# Patient Record
Sex: Male | Born: 1935 | Race: Black or African American | Hispanic: No | Smoking: Former smoker
Health system: Southern US, Community
[De-identification: ages and names within clinical notes are randomized; demographics above are authoritative.]

## PROBLEM LIST (undated history)

## (undated) DIAGNOSIS — R109 Unspecified abdominal pain: Secondary | ICD-10-CM

## (undated) DIAGNOSIS — R569 Unspecified convulsions: Secondary | ICD-10-CM

## (undated) DIAGNOSIS — F0390 Unspecified dementia without behavioral disturbance: Secondary | ICD-10-CM

## (undated) DIAGNOSIS — K219 Gastro-esophageal reflux disease without esophagitis: Secondary | ICD-10-CM

## (undated) DIAGNOSIS — R0600 Dyspnea, unspecified: Secondary | ICD-10-CM

## (undated) DIAGNOSIS — H9190 Unspecified hearing loss, unspecified ear: Secondary | ICD-10-CM

## (undated) DIAGNOSIS — IMO0002 Reserved for concepts with insufficient information to code with codable children: Secondary | ICD-10-CM

## (undated) DIAGNOSIS — N390 Urinary tract infection, site not specified: Secondary | ICD-10-CM

## (undated) DIAGNOSIS — J929 Pleural plaque without asbestos: Secondary | ICD-10-CM

## (undated) DIAGNOSIS — R4182 Altered mental status, unspecified: Principal | ICD-10-CM

## (undated) DIAGNOSIS — T4145XA Adverse effect of unspecified anesthetic, initial encounter: Secondary | ICD-10-CM

## (undated) DIAGNOSIS — F419 Anxiety disorder, unspecified: Secondary | ICD-10-CM

## (undated) DIAGNOSIS — J449 Chronic obstructive pulmonary disease, unspecified: Secondary | ICD-10-CM

## (undated) DIAGNOSIS — G8929 Other chronic pain: Secondary | ICD-10-CM

## (undated) DIAGNOSIS — G9349 Other encephalopathy: Secondary | ICD-10-CM

## (undated) DIAGNOSIS — E876 Hypokalemia: Secondary | ICD-10-CM

## (undated) DIAGNOSIS — J9610 Chronic respiratory failure, unspecified whether with hypoxia or hypercapnia: Secondary | ICD-10-CM

## (undated) DIAGNOSIS — E785 Hyperlipidemia, unspecified: Secondary | ICD-10-CM

## (undated) DIAGNOSIS — R0689 Other abnormalities of breathing: Secondary | ICD-10-CM

## (undated) DIAGNOSIS — E119 Type 2 diabetes mellitus without complications: Secondary | ICD-10-CM

## (undated) DIAGNOSIS — R0609 Other forms of dyspnea: Secondary | ICD-10-CM

## (undated) DIAGNOSIS — C459 Mesothelioma, unspecified: Secondary | ICD-10-CM

## (undated) DIAGNOSIS — D72829 Elevated white blood cell count, unspecified: Secondary | ICD-10-CM

## (undated) DIAGNOSIS — R001 Bradycardia, unspecified: Secondary | ICD-10-CM

## (undated) DIAGNOSIS — B999 Unspecified infectious disease: Secondary | ICD-10-CM

## (undated) DIAGNOSIS — H409 Unspecified glaucoma: Secondary | ICD-10-CM

## (undated) DIAGNOSIS — R0602 Shortness of breath: Secondary | ICD-10-CM

## (undated) DIAGNOSIS — R0902 Hypoxemia: Secondary | ICD-10-CM

## (undated) DIAGNOSIS — I10 Essential (primary) hypertension: Secondary | ICD-10-CM

## (undated) DIAGNOSIS — R918 Other nonspecific abnormal finding of lung field: Secondary | ICD-10-CM

## (undated) HISTORY — DX: Hyperlipidemia, unspecified: E78.5

## (undated) HISTORY — DX: Essential (primary) hypertension: I10

## (undated) HISTORY — DX: Other chronic pain: G89.29

## (undated) HISTORY — DX: Gastro-esophageal reflux disease without esophagitis: K21.9

## (undated) HISTORY — PX: PROSTATE SURGERY: SHX751

## (undated) HISTORY — PX: INGUINAL HERNIA REPAIR: SUR1180

## (undated) HISTORY — DX: Unspecified abdominal pain: R10.9

## (undated) HISTORY — DX: Anxiety disorder, unspecified: F41.9

---

## 2000-12-25 ENCOUNTER — Encounter: Payer: Self-pay | Admitting: Internal Medicine

## 2000-12-25 ENCOUNTER — Ambulatory Visit (HOSPITAL_COMMUNITY): Admission: RE | Admit: 2000-12-25 | Discharge: 2000-12-25 | Payer: Self-pay | Admitting: Internal Medicine

## 2001-04-01 ENCOUNTER — Ambulatory Visit (HOSPITAL_COMMUNITY): Admission: RE | Admit: 2001-04-01 | Discharge: 2001-04-01 | Payer: Self-pay | Admitting: Ophthalmology

## 2001-06-21 ENCOUNTER — Encounter: Payer: Self-pay | Admitting: Emergency Medicine

## 2001-06-21 ENCOUNTER — Emergency Department (HOSPITAL_COMMUNITY): Admission: EM | Admit: 2001-06-21 | Discharge: 2001-06-21 | Payer: Self-pay | Admitting: Emergency Medicine

## 2002-02-27 ENCOUNTER — Ambulatory Visit: Admission: RE | Admit: 2002-02-27 | Discharge: 2002-05-28 | Payer: Self-pay | Admitting: Radiation Oncology

## 2002-03-06 ENCOUNTER — Emergency Department (HOSPITAL_COMMUNITY): Admission: EM | Admit: 2002-03-06 | Discharge: 2002-03-06 | Payer: Self-pay | Admitting: Emergency Medicine

## 2002-03-06 ENCOUNTER — Encounter: Payer: Self-pay | Admitting: Emergency Medicine

## 2002-05-20 ENCOUNTER — Ambulatory Visit (HOSPITAL_COMMUNITY): Admission: RE | Admit: 2002-05-20 | Discharge: 2002-05-20 | Payer: Self-pay | Admitting: Internal Medicine

## 2002-05-20 ENCOUNTER — Encounter: Payer: Self-pay | Admitting: Internal Medicine

## 2002-06-11 ENCOUNTER — Ambulatory Visit (HOSPITAL_COMMUNITY): Admission: RE | Admit: 2002-06-11 | Discharge: 2002-06-11 | Payer: Self-pay | Admitting: Internal Medicine

## 2002-11-16 ENCOUNTER — Emergency Department (HOSPITAL_COMMUNITY): Admission: EM | Admit: 2002-11-16 | Discharge: 2002-11-16 | Payer: Self-pay | Admitting: *Deleted

## 2002-11-16 ENCOUNTER — Encounter: Payer: Self-pay | Admitting: *Deleted

## 2003-03-21 ENCOUNTER — Inpatient Hospital Stay (HOSPITAL_COMMUNITY): Admission: EM | Admit: 2003-03-21 | Discharge: 2003-03-23 | Payer: Self-pay | Admitting: Emergency Medicine

## 2003-03-21 ENCOUNTER — Encounter: Payer: Self-pay | Admitting: Emergency Medicine

## 2003-04-11 ENCOUNTER — Emergency Department (HOSPITAL_COMMUNITY): Admission: EM | Admit: 2003-04-11 | Discharge: 2003-04-11 | Payer: Self-pay | Admitting: Emergency Medicine

## 2003-09-11 DIAGNOSIS — T8859XA Other complications of anesthesia, initial encounter: Secondary | ICD-10-CM

## 2003-09-11 HISTORY — PX: CATARACT EXTRACTION W/ INTRAOCULAR LENS IMPLANT: SHX1309

## 2003-09-11 HISTORY — DX: Other complications of anesthesia, initial encounter: T88.59XA

## 2004-03-15 ENCOUNTER — Emergency Department (HOSPITAL_COMMUNITY): Admission: EM | Admit: 2004-03-15 | Discharge: 2004-03-15 | Payer: Self-pay | Admitting: Emergency Medicine

## 2004-04-19 ENCOUNTER — Ambulatory Visit (HOSPITAL_COMMUNITY): Admission: RE | Admit: 2004-04-19 | Discharge: 2004-04-19 | Payer: Self-pay | Admitting: Internal Medicine

## 2004-05-04 ENCOUNTER — Ambulatory Visit (HOSPITAL_COMMUNITY): Admission: RE | Admit: 2004-05-04 | Discharge: 2004-05-04 | Payer: Self-pay | Admitting: Family Medicine

## 2004-05-05 ENCOUNTER — Encounter (INDEPENDENT_AMBULATORY_CARE_PROVIDER_SITE_OTHER): Payer: Self-pay | Admitting: *Deleted

## 2004-06-08 ENCOUNTER — Ambulatory Visit: Payer: Self-pay | Admitting: Psychiatry

## 2004-06-20 ENCOUNTER — Ambulatory Visit (HOSPITAL_COMMUNITY): Admission: RE | Admit: 2004-06-20 | Discharge: 2004-06-20 | Payer: Self-pay | Admitting: Internal Medicine

## 2004-07-11 ENCOUNTER — Ambulatory Visit: Payer: Self-pay | Admitting: Family Medicine

## 2004-07-12 ENCOUNTER — Ambulatory Visit (HOSPITAL_COMMUNITY): Admission: RE | Admit: 2004-07-12 | Discharge: 2004-07-12 | Payer: Self-pay | Admitting: Family Medicine

## 2004-07-24 ENCOUNTER — Ambulatory Visit: Payer: Self-pay | Admitting: Family Medicine

## 2004-08-08 ENCOUNTER — Ambulatory Visit: Payer: Self-pay | Admitting: Family Medicine

## 2004-08-11 ENCOUNTER — Ambulatory Visit: Payer: Self-pay | Admitting: Psychology

## 2004-11-01 ENCOUNTER — Ambulatory Visit: Payer: Self-pay | Admitting: Family Medicine

## 2004-11-07 ENCOUNTER — Ambulatory Visit: Payer: Self-pay | Admitting: Family Medicine

## 2004-11-28 ENCOUNTER — Ambulatory Visit: Payer: Self-pay | Admitting: Family Medicine

## 2005-01-09 ENCOUNTER — Ambulatory Visit: Payer: Self-pay | Admitting: Family Medicine

## 2005-02-26 ENCOUNTER — Ambulatory Visit: Payer: Self-pay | Admitting: Family Medicine

## 2005-03-01 ENCOUNTER — Ambulatory Visit (HOSPITAL_COMMUNITY): Admission: RE | Admit: 2005-03-01 | Discharge: 2005-03-01 | Payer: Self-pay | Admitting: Family Medicine

## 2005-03-15 ENCOUNTER — Ambulatory Visit: Payer: Self-pay | Admitting: Family Medicine

## 2005-07-03 ENCOUNTER — Encounter (INDEPENDENT_AMBULATORY_CARE_PROVIDER_SITE_OTHER): Payer: Self-pay | Admitting: Urology

## 2005-07-03 ENCOUNTER — Inpatient Hospital Stay (HOSPITAL_COMMUNITY): Admission: RE | Admit: 2005-07-03 | Discharge: 2005-07-06 | Payer: Self-pay | Admitting: Urology

## 2005-07-27 ENCOUNTER — Ambulatory Visit: Payer: Self-pay | Admitting: Family Medicine

## 2005-08-20 ENCOUNTER — Ambulatory Visit: Payer: Self-pay | Admitting: Family Medicine

## 2005-09-26 ENCOUNTER — Ambulatory Visit: Payer: Self-pay | Admitting: Family Medicine

## 2005-11-26 ENCOUNTER — Ambulatory Visit: Payer: Self-pay | Admitting: Family Medicine

## 2005-12-17 ENCOUNTER — Emergency Department (HOSPITAL_COMMUNITY): Admission: EM | Admit: 2005-12-17 | Discharge: 2005-12-17 | Payer: Self-pay | Admitting: Emergency Medicine

## 2006-04-11 ENCOUNTER — Ambulatory Visit: Payer: Self-pay | Admitting: Family Medicine

## 2006-04-11 ENCOUNTER — Ambulatory Visit (HOSPITAL_COMMUNITY): Admission: RE | Admit: 2006-04-11 | Discharge: 2006-04-11 | Payer: Self-pay | Admitting: Family Medicine

## 2006-05-05 ENCOUNTER — Emergency Department (HOSPITAL_COMMUNITY): Admission: EM | Admit: 2006-05-05 | Discharge: 2006-05-05 | Payer: Self-pay | Admitting: Emergency Medicine

## 2006-05-08 ENCOUNTER — Ambulatory Visit: Payer: Self-pay | Admitting: Internal Medicine

## 2006-05-30 ENCOUNTER — Ambulatory Visit (HOSPITAL_COMMUNITY): Admission: RE | Admit: 2006-05-30 | Discharge: 2006-05-30 | Payer: Self-pay | Admitting: Internal Medicine

## 2006-05-30 ENCOUNTER — Ambulatory Visit: Payer: Self-pay | Admitting: Internal Medicine

## 2006-06-03 ENCOUNTER — Ambulatory Visit (HOSPITAL_COMMUNITY): Admission: RE | Admit: 2006-06-03 | Discharge: 2006-06-03 | Payer: Self-pay | Admitting: Internal Medicine

## 2006-07-12 ENCOUNTER — Ambulatory Visit: Payer: Self-pay | Admitting: Family Medicine

## 2006-07-16 ENCOUNTER — Ambulatory Visit (HOSPITAL_COMMUNITY): Admission: RE | Admit: 2006-07-16 | Discharge: 2006-07-16 | Payer: Self-pay | Admitting: Family Medicine

## 2006-08-08 ENCOUNTER — Emergency Department (HOSPITAL_COMMUNITY): Admission: EM | Admit: 2006-08-08 | Discharge: 2006-08-08 | Payer: Self-pay | Admitting: Emergency Medicine

## 2006-08-12 ENCOUNTER — Ambulatory Visit: Payer: Self-pay | Admitting: Internal Medicine

## 2006-08-15 ENCOUNTER — Ambulatory Visit: Payer: Self-pay | Admitting: Family Medicine

## 2006-10-01 ENCOUNTER — Ambulatory Visit: Payer: Self-pay | Admitting: Family Medicine

## 2006-11-06 ENCOUNTER — Ambulatory Visit: Payer: Self-pay | Admitting: Family Medicine

## 2006-12-06 ENCOUNTER — Ambulatory Visit: Payer: Self-pay | Admitting: Family Medicine

## 2006-12-19 ENCOUNTER — Encounter: Payer: Self-pay | Admitting: Family Medicine

## 2006-12-19 LAB — CONVERTED CEMR LAB
ALT: 24 units/L (ref 0–53)
AST: 19 units/L (ref 0–37)
Albumin: 4.3 g/dL (ref 3.5–5.2)
Bilirubin, Direct: 0.1 mg/dL (ref 0.0–0.3)
Chloride: 111 meq/L (ref 96–112)
Cholesterol: 149 mg/dL (ref 0–200)
Glucose, Bld: 96 mg/dL (ref 70–99)
HDL: 44 mg/dL (ref >39)
Indirect Bilirubin: 0.3 mg/dL (ref 0.0–0.9)
LDL Cholesterol: 85 mg/dL (ref 0–99)
Sodium: 143 meq/L (ref 135–145)
VLDL: 20 mg/dL (ref 0–40)

## 2006-12-30 ENCOUNTER — Ambulatory Visit: Payer: Self-pay | Admitting: Family Medicine

## 2007-01-23 ENCOUNTER — Ambulatory Visit: Payer: Self-pay | Admitting: Family Medicine

## 2007-03-24 ENCOUNTER — Ambulatory Visit: Payer: Self-pay | Admitting: Family Medicine

## 2007-06-24 ENCOUNTER — Ambulatory Visit: Payer: Self-pay | Admitting: Family Medicine

## 2007-06-28 ENCOUNTER — Emergency Department (HOSPITAL_COMMUNITY): Admission: EM | Admit: 2007-06-28 | Discharge: 2007-06-28 | Payer: Self-pay | Admitting: Emergency Medicine

## 2007-07-08 ENCOUNTER — Encounter: Payer: Self-pay | Admitting: Family Medicine

## 2007-07-08 ENCOUNTER — Encounter (INDEPENDENT_AMBULATORY_CARE_PROVIDER_SITE_OTHER): Payer: Self-pay | Admitting: *Deleted

## 2007-07-08 LAB — CONVERTED CEMR LAB
Bilirubin, Direct: 0.1 mg/dL (ref 0.0–0.3)
Cholesterol: 172 mg/dL (ref 0–200)
Eosinophils Absolute: 0.2 10*3/uL (ref 0.0–0.7)
Eosinophils Relative: 3 % (ref 0–5)
HCT: 45.1 % (ref 39.0–52.0)
Indirect Bilirubin: 0.4 mg/dL (ref 0.0–0.9)
Lymphs Abs: 2.9 10*3/uL (ref 0.7–3.3)
Monocytes Relative: 7 % (ref 3–11)
Neutro Abs: 3.4 10*3/uL (ref 1.7–7.7)
Neutrophils Relative %: 49 % (ref 43–77)
Platelets: 250 10*3/uL (ref 150–400)
Sodium: 145 meq/L (ref 135–145)
Total Bilirubin: 0.5 mg/dL (ref 0.3–1.2)
Total CHOL/HDL Ratio: 3.9
Total Protein: 8.1 g/dL (ref 6.0–8.3)
Triglycerides: 159 mg/dL — ABNORMAL HIGH (ref <150)

## 2007-07-21 ENCOUNTER — Ambulatory Visit: Payer: Self-pay | Admitting: Family Medicine

## 2007-07-24 ENCOUNTER — Ambulatory Visit (HOSPITAL_COMMUNITY): Admission: RE | Admit: 2007-07-24 | Discharge: 2007-07-24 | Payer: Self-pay | Admitting: Family Medicine

## 2007-08-11 ENCOUNTER — Ambulatory Visit: Payer: Self-pay | Admitting: Internal Medicine

## 2007-08-18 ENCOUNTER — Ambulatory Visit: Payer: Self-pay | Admitting: Internal Medicine

## 2007-08-18 ENCOUNTER — Ambulatory Visit (HOSPITAL_COMMUNITY): Admission: RE | Admit: 2007-08-18 | Discharge: 2007-08-18 | Payer: Self-pay | Admitting: Internal Medicine

## 2007-09-11 ENCOUNTER — Encounter: Payer: Self-pay | Admitting: Family Medicine

## 2007-10-16 ENCOUNTER — Ambulatory Visit: Payer: Self-pay | Admitting: Internal Medicine

## 2007-12-09 ENCOUNTER — Emergency Department (HOSPITAL_COMMUNITY): Admission: EM | Admit: 2007-12-09 | Discharge: 2007-12-09 | Payer: Self-pay | Admitting: Emergency Medicine

## 2007-12-24 ENCOUNTER — Encounter (INDEPENDENT_AMBULATORY_CARE_PROVIDER_SITE_OTHER): Payer: Self-pay | Admitting: *Deleted

## 2007-12-24 DIAGNOSIS — R109 Unspecified abdominal pain: Secondary | ICD-10-CM | POA: Insufficient documentation

## 2007-12-24 DIAGNOSIS — K219 Gastro-esophageal reflux disease without esophagitis: Secondary | ICD-10-CM

## 2007-12-24 DIAGNOSIS — I10 Essential (primary) hypertension: Secondary | ICD-10-CM | POA: Insufficient documentation

## 2007-12-24 DIAGNOSIS — E785 Hyperlipidemia, unspecified: Secondary | ICD-10-CM

## 2007-12-24 DIAGNOSIS — H409 Unspecified glaucoma: Secondary | ICD-10-CM | POA: Insufficient documentation

## 2007-12-24 DIAGNOSIS — F411 Generalized anxiety disorder: Secondary | ICD-10-CM | POA: Insufficient documentation

## 2008-01-13 ENCOUNTER — Ambulatory Visit: Payer: Self-pay | Admitting: Family Medicine

## 2008-03-08 ENCOUNTER — Ambulatory Visit: Payer: Self-pay | Admitting: Family Medicine

## 2008-03-14 ENCOUNTER — Emergency Department (HOSPITAL_COMMUNITY): Admission: EM | Admit: 2008-03-14 | Discharge: 2008-03-14 | Payer: Self-pay | Admitting: Emergency Medicine

## 2008-04-05 ENCOUNTER — Ambulatory Visit: Payer: Self-pay | Admitting: Family Medicine

## 2008-04-26 ENCOUNTER — Telehealth (INDEPENDENT_AMBULATORY_CARE_PROVIDER_SITE_OTHER): Payer: Self-pay | Admitting: *Deleted

## 2008-04-27 ENCOUNTER — Encounter: Payer: Self-pay | Admitting: Family Medicine

## 2008-05-07 ENCOUNTER — Ambulatory Visit: Payer: Self-pay | Admitting: Family Medicine

## 2008-05-10 ENCOUNTER — Encounter: Payer: Self-pay | Admitting: Family Medicine

## 2008-05-10 LAB — CONVERTED CEMR LAB
Albumin: 4.2 g/dL (ref 3.5–5.2)
Alkaline Phosphatase: 75 units/L (ref 39–117)
Bilirubin, Direct: 0.1 mg/dL (ref 0.0–0.3)
CO2: 22 meq/L (ref 19–32)
Calcium: 9.7 mg/dL (ref 8.4–10.5)
Cholesterol: 154 mg/dL (ref 0–200)
Creatinine, Ser: 1.22 mg/dL (ref 0.40–1.50)
Sodium: 140 meq/L (ref 135–145)
Total CHOL/HDL Ratio: 3
Triglycerides: 98 mg/dL (ref <150)

## 2008-05-13 ENCOUNTER — Encounter: Payer: Self-pay | Admitting: Family Medicine

## 2008-06-18 ENCOUNTER — Ambulatory Visit (HOSPITAL_COMMUNITY): Admission: RE | Admit: 2008-06-18 | Discharge: 2008-06-18 | Payer: Self-pay | Admitting: Family Medicine

## 2008-06-18 ENCOUNTER — Ambulatory Visit: Payer: Self-pay | Admitting: Family Medicine

## 2008-06-18 LAB — CONVERTED CEMR LAB
BUN: 13 mg/dL (ref 6–23)
Calcium: 10 mg/dL (ref 8.4–10.5)
Glucose, Bld: 95 mg/dL (ref 70–99)

## 2008-06-24 ENCOUNTER — Ambulatory Visit: Payer: Self-pay | Admitting: Internal Medicine

## 2008-06-25 ENCOUNTER — Telehealth: Payer: Self-pay | Admitting: Family Medicine

## 2008-07-08 ENCOUNTER — Encounter: Payer: Self-pay | Admitting: Family Medicine

## 2008-07-08 ENCOUNTER — Telehealth: Payer: Self-pay | Admitting: Family Medicine

## 2008-07-23 ENCOUNTER — Ambulatory Visit: Payer: Self-pay | Admitting: Family Medicine

## 2008-07-23 DIAGNOSIS — F0390 Unspecified dementia without behavioral disturbance: Secondary | ICD-10-CM

## 2008-07-23 DIAGNOSIS — F22 Delusional disorders: Secondary | ICD-10-CM

## 2008-08-11 ENCOUNTER — Telehealth: Payer: Self-pay | Admitting: Family Medicine

## 2008-08-12 ENCOUNTER — Encounter: Payer: Self-pay | Admitting: Family Medicine

## 2008-08-13 ENCOUNTER — Ambulatory Visit: Payer: Self-pay | Admitting: Family Medicine

## 2008-08-13 LAB — CONVERTED CEMR LAB: TSH: 1.707 microintl units/mL (ref 0.350–4.50)

## 2008-08-25 ENCOUNTER — Telehealth: Payer: Self-pay | Admitting: Family Medicine

## 2008-09-09 ENCOUNTER — Ambulatory Visit: Payer: Self-pay | Admitting: Family Medicine

## 2008-09-09 DIAGNOSIS — M549 Dorsalgia, unspecified: Secondary | ICD-10-CM | POA: Insufficient documentation

## 2008-09-13 ENCOUNTER — Encounter: Payer: Self-pay | Admitting: Family Medicine

## 2008-09-23 ENCOUNTER — Encounter: Payer: Self-pay | Admitting: Family Medicine

## 2008-09-24 ENCOUNTER — Ambulatory Visit (HOSPITAL_COMMUNITY): Payer: Self-pay | Admitting: Psychology

## 2008-11-04 ENCOUNTER — Ambulatory Visit: Payer: Self-pay | Admitting: Family Medicine

## 2008-11-08 ENCOUNTER — Encounter: Payer: Self-pay | Admitting: Family Medicine

## 2008-11-25 ENCOUNTER — Ambulatory Visit: Payer: Self-pay | Admitting: Family Medicine

## 2008-11-25 DIAGNOSIS — R5381 Other malaise: Secondary | ICD-10-CM

## 2008-11-25 DIAGNOSIS — R5383 Other fatigue: Secondary | ICD-10-CM

## 2008-11-29 ENCOUNTER — Encounter: Payer: Self-pay | Admitting: Family Medicine

## 2008-11-29 LAB — CONVERTED CEMR LAB
AST: 15 units/L (ref 0–37)
Albumin: 4.1 g/dL (ref 3.5–5.2)
Alkaline Phosphatase: 77 units/L (ref 39–117)
BUN: 14 mg/dL (ref 6–23)
Basophils Relative: 0 % (ref 0–1)
Bilirubin, Direct: 0.1 mg/dL (ref 0.0–0.3)
CO2: 28 meq/L (ref 19–32)
Calcium: 9.6 mg/dL (ref 8.4–10.5)
Chloride: 105 meq/L (ref 96–112)
Creatinine, Ser: 1.18 mg/dL (ref 0.40–1.50)
Eosinophils Absolute: 0.2 10*3/uL (ref 0.0–0.7)
Eosinophils Relative: 3 % (ref 0–5)
Glucose, Bld: 88 mg/dL (ref 70–99)
HCT: 40.2 % (ref 39.0–52.0)
HDL: 42 mg/dL (ref >39)
LDL Cholesterol: 68 mg/dL (ref 0–99)
Lymphs Abs: 2.8 10*3/uL (ref 0.7–4.0)
MCHC: 29.9 g/dL — ABNORMAL LOW (ref 30.0–36.0)
MCV: 87.8 fL (ref 78.0–100.0)
Monocytes Absolute: 0.6 10*3/uL (ref 0.1–1.0)
Monocytes Relative: 9 % (ref 3–12)
Neutrophils Relative %: 46 % (ref 43–77)
RBC: 4.58 M/uL (ref 4.22–5.81)
Total Bilirubin: 0.3 mg/dL (ref 0.3–1.2)
WBC: 6.7 10*3/uL (ref 4.0–10.5)

## 2008-12-03 ENCOUNTER — Encounter: Payer: Self-pay | Admitting: Family Medicine

## 2008-12-07 ENCOUNTER — Encounter: Payer: Self-pay | Admitting: Family Medicine

## 2008-12-11 ENCOUNTER — Emergency Department (HOSPITAL_COMMUNITY): Admission: EM | Admit: 2008-12-11 | Discharge: 2008-12-11 | Payer: Self-pay | Admitting: Emergency Medicine

## 2008-12-13 ENCOUNTER — Telehealth: Payer: Self-pay | Admitting: Family Medicine

## 2008-12-27 ENCOUNTER — Ambulatory Visit: Payer: Self-pay | Admitting: Family Medicine

## 2008-12-28 ENCOUNTER — Encounter: Payer: Self-pay | Admitting: Family Medicine

## 2008-12-29 ENCOUNTER — Encounter: Payer: Self-pay | Admitting: Family Medicine

## 2008-12-31 ENCOUNTER — Encounter: Payer: Self-pay | Admitting: Family Medicine

## 2009-01-04 ENCOUNTER — Ambulatory Visit (HOSPITAL_COMMUNITY): Payer: Self-pay | Admitting: Psychology

## 2009-01-24 ENCOUNTER — Ambulatory Visit (HOSPITAL_COMMUNITY): Payer: Self-pay | Admitting: Psychology

## 2009-01-31 ENCOUNTER — Telehealth: Payer: Self-pay | Admitting: Family Medicine

## 2009-02-21 ENCOUNTER — Encounter: Payer: Self-pay | Admitting: Family Medicine

## 2009-02-21 ENCOUNTER — Telehealth: Payer: Self-pay | Admitting: Family Medicine

## 2009-02-25 ENCOUNTER — Ambulatory Visit (HOSPITAL_COMMUNITY): Payer: Self-pay | Admitting: Psychology

## 2009-02-25 ENCOUNTER — Ambulatory Visit: Payer: Self-pay | Admitting: Family Medicine

## 2009-02-25 DIAGNOSIS — G4700 Insomnia, unspecified: Secondary | ICD-10-CM | POA: Insufficient documentation

## 2009-03-01 ENCOUNTER — Ambulatory Visit: Payer: Self-pay | Admitting: Family Medicine

## 2009-03-02 ENCOUNTER — Encounter: Payer: Self-pay | Admitting: Family Medicine

## 2009-03-21 ENCOUNTER — Encounter (INDEPENDENT_AMBULATORY_CARE_PROVIDER_SITE_OTHER): Payer: Self-pay | Admitting: *Deleted

## 2009-03-21 ENCOUNTER — Ambulatory Visit: Payer: Self-pay | Admitting: Family Medicine

## 2009-03-23 ENCOUNTER — Encounter: Payer: Self-pay | Admitting: Family Medicine

## 2009-04-03 ENCOUNTER — Emergency Department (HOSPITAL_COMMUNITY): Admission: EM | Admit: 2009-04-03 | Discharge: 2009-04-03 | Payer: Self-pay | Admitting: Emergency Medicine

## 2009-04-19 ENCOUNTER — Telehealth: Payer: Self-pay | Admitting: Family Medicine

## 2009-04-20 ENCOUNTER — Ambulatory Visit: Payer: Self-pay | Admitting: Internal Medicine

## 2009-04-21 ENCOUNTER — Encounter: Payer: Self-pay | Admitting: Family Medicine

## 2009-04-29 ENCOUNTER — Encounter: Payer: Self-pay | Admitting: Family Medicine

## 2009-05-04 ENCOUNTER — Ambulatory Visit: Payer: Self-pay | Admitting: Family Medicine

## 2009-05-09 ENCOUNTER — Encounter: Payer: Self-pay | Admitting: Family Medicine

## 2009-05-19 ENCOUNTER — Encounter: Payer: Self-pay | Admitting: Family Medicine

## 2009-05-25 ENCOUNTER — Encounter: Payer: Self-pay | Admitting: Family Medicine

## 2009-05-31 ENCOUNTER — Encounter: Payer: Self-pay | Admitting: Family Medicine

## 2009-06-03 ENCOUNTER — Ambulatory Visit: Payer: Self-pay | Admitting: Family Medicine

## 2009-06-06 LAB — CONVERTED CEMR LAB
Albumin: 4.2 g/dL (ref 3.5–5.2)
BUN: 18 mg/dL (ref 6–23)
CO2: 26 meq/L (ref 19–32)
Chloride: 111 meq/L (ref 96–112)
HDL: 48 mg/dL (ref >39)
LDL Cholesterol: 66 mg/dL (ref 0–99)
Potassium: 4.9 meq/L (ref 3.5–5.3)
Total Bilirubin: 0.2 mg/dL — ABNORMAL LOW (ref 0.3–1.2)
Total Protein: 7.2 g/dL (ref 6.0–8.3)
Triglycerides: 140 mg/dL (ref <150)
VLDL: 28 mg/dL (ref 0–40)

## 2009-06-22 ENCOUNTER — Encounter: Payer: Self-pay | Admitting: Family Medicine

## 2009-06-27 ENCOUNTER — Encounter: Payer: Self-pay | Admitting: Family Medicine

## 2009-07-11 ENCOUNTER — Encounter: Payer: Self-pay | Admitting: Family Medicine

## 2009-07-18 ENCOUNTER — Telehealth: Payer: Self-pay | Admitting: Family Medicine

## 2009-07-18 ENCOUNTER — Encounter: Payer: Self-pay | Admitting: Family Medicine

## 2009-07-19 ENCOUNTER — Telehealth: Payer: Self-pay | Admitting: Family Medicine

## 2009-07-19 ENCOUNTER — Encounter: Payer: Self-pay | Admitting: Family Medicine

## 2009-07-26 ENCOUNTER — Ambulatory Visit: Payer: Self-pay | Admitting: Family Medicine

## 2009-07-27 ENCOUNTER — Encounter: Payer: Self-pay | Admitting: Family Medicine

## 2009-09-10 ENCOUNTER — Encounter: Payer: Self-pay | Admitting: Family Medicine

## 2009-09-15 ENCOUNTER — Ambulatory Visit: Payer: Self-pay | Admitting: Family Medicine

## 2009-09-19 ENCOUNTER — Ambulatory Visit: Payer: Self-pay | Admitting: Family Medicine

## 2009-10-11 HISTORY — PX: OTHER SURGICAL HISTORY: SHX169

## 2009-10-31 ENCOUNTER — Ambulatory Visit: Payer: Self-pay | Admitting: Family Medicine

## 2009-11-01 ENCOUNTER — Encounter: Payer: Self-pay | Admitting: Family Medicine

## 2009-11-25 ENCOUNTER — Ambulatory Visit: Payer: Self-pay | Admitting: Family Medicine

## 2009-11-25 ENCOUNTER — Emergency Department (HOSPITAL_COMMUNITY): Admission: EM | Admit: 2009-11-25 | Discharge: 2009-11-25 | Payer: Self-pay | Admitting: Emergency Medicine

## 2009-11-25 DIAGNOSIS — R0602 Shortness of breath: Secondary | ICD-10-CM

## 2009-11-25 DIAGNOSIS — R05 Cough: Secondary | ICD-10-CM

## 2009-12-08 ENCOUNTER — Telehealth: Payer: Self-pay | Admitting: Family Medicine

## 2009-12-20 ENCOUNTER — Encounter: Payer: Self-pay | Admitting: Family Medicine

## 2009-12-27 ENCOUNTER — Telehealth: Payer: Self-pay | Admitting: Family Medicine

## 2009-12-27 ENCOUNTER — Ambulatory Visit: Payer: Self-pay | Admitting: Family Medicine

## 2009-12-27 DIAGNOSIS — J309 Allergic rhinitis, unspecified: Secondary | ICD-10-CM | POA: Insufficient documentation

## 2009-12-28 ENCOUNTER — Encounter: Payer: Self-pay | Admitting: Family Medicine

## 2010-02-20 ENCOUNTER — Telehealth: Payer: Self-pay | Admitting: Family Medicine

## 2010-02-20 ENCOUNTER — Ambulatory Visit: Payer: Self-pay | Admitting: Family Medicine

## 2010-03-24 ENCOUNTER — Telehealth: Payer: Self-pay | Admitting: Family Medicine

## 2010-03-31 ENCOUNTER — Ambulatory Visit: Payer: Self-pay | Admitting: Family Medicine

## 2010-04-26 ENCOUNTER — Ambulatory Visit: Payer: Self-pay | Admitting: Family Medicine

## 2010-04-27 ENCOUNTER — Encounter: Payer: Self-pay | Admitting: Family Medicine

## 2010-05-02 ENCOUNTER — Telehealth: Payer: Self-pay | Admitting: Family Medicine

## 2010-05-02 LAB — CONVERTED CEMR LAB
Albumin: 4.1 g/dL (ref 3.5–5.2)
Alkaline Phosphatase: 82 units/L (ref 39–117)
BUN: 17 mg/dL (ref 6–23)
Bilirubin, Direct: 0.1 mg/dL (ref 0.0–0.3)
CO2: 23 meq/L (ref 19–32)
Chloride: 110 meq/L (ref 96–112)
Creatinine, Ser: 1.29 mg/dL (ref 0.40–1.50)
Glucose, Bld: 101 mg/dL — ABNORMAL HIGH (ref 70–99)
HCT: 41.6 % (ref 39.0–52.0)
Hemoglobin: 12.3 g/dL — ABNORMAL LOW (ref 13.0–17.0)
Indirect Bilirubin: 0.2 mg/dL (ref 0.0–0.9)
LDL Cholesterol: 85 mg/dL (ref 0–99)
Lymphocytes Relative: 30 % (ref 12–46)
Monocytes Absolute: 0.7 10*3/uL (ref 0.1–1.0)
Monocytes Relative: 9 % (ref 3–12)
Neutro Abs: 4.3 10*3/uL (ref 1.7–7.7)
RBC: 4.48 M/uL (ref 4.22–5.81)
Total Bilirubin: 0.3 mg/dL (ref 0.3–1.2)

## 2010-05-05 ENCOUNTER — Encounter: Payer: Self-pay | Admitting: Family Medicine

## 2010-05-09 ENCOUNTER — Encounter: Payer: Self-pay | Admitting: Family Medicine

## 2010-06-21 ENCOUNTER — Ambulatory Visit: Payer: Self-pay | Admitting: Family Medicine

## 2010-06-23 ENCOUNTER — Encounter: Payer: Self-pay | Admitting: Family Medicine

## 2010-08-09 ENCOUNTER — Encounter: Payer: Self-pay | Admitting: Family Medicine

## 2010-08-15 ENCOUNTER — Ambulatory Visit: Payer: Self-pay | Admitting: Family Medicine

## 2010-09-06 ENCOUNTER — Telehealth: Payer: Self-pay | Admitting: Family Medicine

## 2010-09-07 ENCOUNTER — Encounter: Payer: Self-pay | Admitting: Family Medicine

## 2010-09-07 ENCOUNTER — Ambulatory Visit
Admission: RE | Admit: 2010-09-07 | Discharge: 2010-09-07 | Payer: Self-pay | Source: Home / Self Care | Attending: Family Medicine | Admitting: Family Medicine

## 2010-10-01 ENCOUNTER — Encounter: Payer: Self-pay | Admitting: Family Medicine

## 2010-10-10 NOTE — Miscellaneous (Signed)
Summary: resident information  resident information   Imported By: Lind Guest 05/09/2010 13:05:34  _____________________________________________________________________  External Attachment:    Type:   Image     Comment:   External Document

## 2010-10-10 NOTE — Letter (Signed)
Summary: x  rays  x  rays   Imported By: Lind Guest 01/20/2010 15:38:22  _____________________________________________________________________  External Attachment:    Type:   Image     Comment:   External Document

## 2010-10-10 NOTE — Assessment & Plan Note (Signed)
Summary: office visit   Vital Signs:  Patient profile:   77 year old male Height:      66.5 inches Weight:      170 pounds BMI:     27.13 O2 Sat:      93 % on Room air Pulse rate:   62 / minute Pulse rhythm:   regular Resp:     16 per minute BP sitting:   110 / 82  (left arm) Cuff size:   regular  Vitals Entered By: Everitt Amber LPN (December 27, 2009 11:23 AM)  Nutrition Counseling: Patient's BMI is greater than 25 and therefore counseled on weight management options.  O2 Flow:  Room air CC: allergies acting up, nose running bad and has a cough from all the pollen   Primary Care Provider:  simpson  CC:  allergies acting up and nose running bad and has a cough from all the pollen.  History of Present Illness: Reports  that he is faIR. Denies recent fever or chills. Denies sinus pressure, , ear pain or sore throateXCESSIVE RUNNY NOSE, clear drainage Denies chest congestion, or cough productive of sputum.dRY COUGH Denies chest pain, palpitations, PND, orthopnea or leg swelling. Chronic  abdominal pain,denies  nausea, vomitting, diarrhea or constipation. Denies change in bowel movements or bloody stool. Denies dysuria , frequency, incontinence or hesitancy.Uses meds for urinary symptoms with success Chronic joint pain, swelling, or reduced mobility. Denies headaches, vertigo, seizures. chronic depression, anxiety and insomnia.Hallucinations , chronic Denies  rash, lesions, or itch.     Current Medications (verified): 1)  Simvastatin 20 Mg Tabs (Simvastatin) .... Take 1 Tab By Mouth At Bedtime 2)  Xalatan 0.005 %  Soln (Latanoprost) .... One Drop Each Eye At Bedtime 3)  Amlodipine Besylate 5 Mg  Tabs (Amlodipine Besylate) .... One Tab By Mouth Once Daily 4)  Polyethylene Glycol 3350  Powd (Polyethylene Glycol 3350) .... Mix 17 G in 8 Oz of Juice or Water 5)  Risperdal 0.25 Mg Tabs (Risperidone) .... One Tab By Mouth Qhs 6)  Exelon 9.5 Mg/24hr Pt24 (Rivastigmine) .... Apply  One Patch Daily 7)  Detrol La 4 Mg Xr24h-Cap (Tolterodine Tartrate) .... Take 1 Tablet By Mouth Once A Day 8)  Singulair 10 Mg Tabs (Montelukast Sodium) .... One Tab By Mouth Once Daily 9)  Omeprazole 20 Mg Cpdr (Omeprazole) .... One Cap By Mouth Qd 10)  Dorzolamide Hcl 2 % Soln (Dorzolamide Hcl) .... Place 1 Drop in Ech Eye Every 12 Hrs 11)  Docusate Sodium 100 Mg Caps (Docusate Sodium) .... Take 1 Tablet By Mouth Two Times A Day 12)  Restoril 15 Mg Caps (Temazepam) .... Take 1 Tab By Mouth At Bedtime 13)  Klonopin 1 Mg Tabs (Clonazepam) .... Take 1/2 Tablet Twice A Day and 1 At Bedtime 14)  Tylenol Pm Extra Strength 500-25 Mg Tabs (Diphenhydramine-Apap (Sleep)) .... As Directed 15)  Mylanta 200-200-20 Mg/70ml Susp (Alum & Mag Hydroxide-Simeth) .... 2 To 4 Tsp Qid As Needed 16)  Prednisolone Acetate 1 % Susp (Prednisolone Acetate) .... One Drop Into Right Eye Qid  Allergies (verified): No Known Drug Allergies  Review of Systems      See HPI General:  Complains of fatigue and weakness. Eyes:  Complains of vision loss-both eyes. ENT:  Complains of decreased hearing, nasal congestion, and postnasal drainage. Resp:  Complains of cough; denies sputum productive. GI:  Complains of abdominal pain. GU:  Complains of urinary hesitancy. MS:  Complains of joint pain, low back pain, mid  back pain, and stiffness. Psych:  Complains of anxiety, depression, mental problems, and unusual visions or sounds; denies suicidal thoughts/plans and thoughts of violence. Endo:  Denies excessive thirst and excessive urination. Heme:  Denies abnormal bruising and bleeding. Allergy:  Complains of seasonal allergies and sneezing; excessive tearing, watery runny nose and couygh x 5 days.  Physical Exam  General:  Well-developed,well-nourished,in no acute distress; alert,appropriate and cooperative throughout examination HEENT: No facial asymmetry,  EOMI, No sinus tenderness, TM's Clear, oropharynx  pink and moist.  Erythema and edema of nasal mucosa  Chest: Clear to auscultation bilaterally.  CVS: S1, S2, No murmurs, No S3.   Abd: Soft, Nontender.  MS: decreased ROM spine, hips, shoulders and knees.  Ext: No edema.   CNS: CN 2-12 intact, power tone and sensation normal throughout.   Skin: Intact, no visible lesions or rashes.  Psych: Good eye contact, normal affect.  Memory loss, both anxious and depressed appearing   Impression & Recommendations:  Problem # 1:  ALLERGIC RHINITIS CAUSE UNSPECIFIED (ICD-477.9) Assessment Deteriorated  Orders: Depo- Medrol 80mg  (J1040) Admin of Therapeutic Inj  intramuscular or subcutaneous ([address])  Problem # 2:  COUGH (ICD-786.2) Assessment: Deteriorated secondary to uncontrolled allergies  Problem # 3:  ANXIETY (ICD-300.00) Assessment: Unchanged  His updated medication list for this problem includes:    Klonopin 1 Mg Tabs (Clonazepam) .Marland Kitchen... Take 1/2 tablet twice a day and 1 at bedtime  Problem # 4:  HYPERTENSION (ICD-401.9) Assessment: Unchanged  His updated medication list for this problem includes:    Amlodipine Besylate 5 Mg Tabs (Amlodipine besylate) ..... One tab by mouth once daily  Orders: T-Basic Metabolic Panel [id number])  BP today: 110/82 Prior BP: 110/60 (11/25/2009)  Labs Reviewed: K+: 4.9 (06/06/2009) Creat: : 1.18 (06/06/2009)   Chol: 142 (06/06/2009)   HDL: 48 (06/06/2009)   LDL: 66 (06/06/2009)   TG: 140 (06/06/2009)  Problem # 5:  HYPERLIPIDEMIA (ICD-272.4) Assessment: Comment Only  His updated medication list for this problem includes:    Simvastatin 20 Mg Tabs (Simvastatin) .Marland Kitchen... Take 1 tab by mouth at bedtime  Orders: T-Lipid Profile [id number]) T-Hepatic Function [phone])  Labs Reviewed: SGOT: 13 (06/06/2009)   SGPT: 11 (06/06/2009)   HDL:48 (06/06/2009), 42 (11/29/2008)  LDL:66 (06/06/2009), 68 (11/29/2008)  Chol:142 (06/06/2009), 139 (11/29/2008)  Trig:140 (06/06/2009), 146 (11/29/2008)  Complete  Medication List: 1)  Simvastatin 20 Mg Tabs (Simvastatin) .... Take 1 tab by mouth at bedtime 2)  Xalatan 0.005 % Soln (Latanoprost) .... One drop each eye at bedtime 3)  Amlodipine Besylate 5 Mg Tabs (Amlodipine besylate) .... One tab by mouth once daily 4)  Polyethylene Glycol 3350 Powd (Polyethylene glycol 3350) .... Mix 17 g in 8 oz of juice or water 5)  Risperdal 0.25 Mg Tabs (Risperidone) .... One tab by mouth qhs 6)  Exelon 9.5 Mg/24hr Pt24 (Rivastigmine) .... Apply one patch daily 7)  Detrol La 4 Mg Xr24h-cap (Tolterodine tartrate) .... Take 1 tablet by mouth once a day 8)  Singulair 10 Mg Tabs (Montelukast sodium) .... One tab by mouth once daily 9)  Omeprazole 20 Mg Cpdr (Omeprazole) .... One cap by mouth qd 10)  Dorzolamide Hcl 2 % Soln (Dorzolamide hcl) .... Place 1 drop in ech eye every 12 hrs 11)  Docusate Sodium 100 Mg Caps (Docusate sodium) .... Take 1 tablet by mouth two times a day 12)  Restoril 15 Mg Caps (Temazepam) .... Take 1 tab by mouth at bedtime 13)  Klonopin 1 Mg Tabs (Clonazepam) .Marland KitchenMarland KitchenMarland Kitchen  Take 1/2 tablet twice a day and 1 at bedtime 14)  Tylenol Pm Extra Strength 500-25 Mg Tabs (Diphenhydramine-apap (sleep)) .... As directed 15)  Mylanta 200-200-20 Mg/46ml Susp (Alum & mag hydroxide-simeth) .... 2 to 4 tsp qid as needed 16)  Prednisolone Acetate 1 % Susp (Prednisolone acetate) .... One drop into right eye qid 17)  Prednisone (pak) 5 Mg Tabs (Prednisone) .... Use as directed 18)  Tessalon Perles 100 Mg Caps (Benzonatate) .... Take 1 capsule by mouth three times a day  Other Orders: T-CBC w/Diff [phone]) T-TSH [phone]) T-Vitamin D (25-Hydroxy) [phone])  Patient Instructions: 1)  Please schedule a follow-up appointment in 3.5 months. 2)  BMP prior to visit, ICD-9: 3)  Hepatic Panel prior to visit, ICD-9: 4)  Lipid Panel prior to visit, ICD-9:   fasting  in end of May 5)  TSH prior to visit, ICD-9: 6)  CBC w/ Diff prior to visit, ICD-9: 7)  Vit d  level 8)  new med for allergy and cough Prescriptions: TESSALON PERLES 100 MG CAPS (BENZONATATE) Take 1 capsule by mouth three times a day  #21 x 0   Entered and Authorized by:   Syliva Overman MD   Signed by:   Syliva Overman MD on 12/27/2009   Method used:   Electronically to        [facility] (retail)       [address]       [place], [place]  [address]       Ph: [phone]       Fax: [phone]   RxID:   [id number] PREDNISONE (PAK) 5 MG TABS (PREDNISONE) Use as directed  #21 x 0   Entered and Authorized by:   Syliva Overman MD   Signed by:   Syliva Overman MD on 12/27/2009   Method used:   Electronically to        [facility] (retail)       [address]       [place], [place]  [address]       Ph: [phone]       Fax: [phone]   RxID:   [id number]    Medication Administration  Injection # 1:    Medication: Depo- Medrol 80mg     Diagnosis: ALLERGIC RHINITIS CAUSE UNSPECIFIED (ICD-477.9)    Route: IM    Site: LUOQ gluteus    Exp Date: 11/11    Lot #: [id number]    Mfr: [employer]    Patient tolerated injection without complications    Given by: Adella Hare LPN (December 27, 2009 12:08 PM)  Orders Added: 1)  Est. Patient Level IV [[address]] 2)  T-Basic Metabolic Panel [id number] 3)  T-Lipid Profile [80061-22930] 4)  T-Hepatic Function [80076-22960] 5)  T-CBC w/Diff [[id number]] 6)  T-TSH [[id number]] 7)  T-Vitamin D (25-Hydroxy) [77824-23536] 8)  Depo- Medrol 80mg  [J1040] 9)  Admin of Therapeutic Inj  intramuscular or subcutaneous [[address]]

## 2010-10-10 NOTE — Letter (Signed)
Summary: phone notes  phone notes   Imported By: Lind Guest 01/20/2010 15:37:53  _____________________________________________________________________  External Attachment:    Type:   Image     Comment:   External Document

## 2010-10-10 NOTE — Miscellaneous (Signed)
Summary: drug review  drug review   Imported By: Lind Guest 05/09/2010 13:05:09  _____________________________________________________________________  External Attachment:    Type:   Image     Comment:   External Document

## 2010-10-10 NOTE — Assessment & Plan Note (Signed)
Summary: RECERT   Allergies: No Known Drug Allergies   Complete Medication List: 1)  Simvastatin 20 Mg Tabs (Simvastatin) .... Take 1 tab by mouth at bedtime 2)  Xalatan 0.005 % Soln (Latanoprost) .... One drop each eye at bedtime 3)  Amlodipine Besylate 5 Mg Tabs (Amlodipine besylate) .... One tab by mouth once daily 4)  Polyethylene Glycol 3350 Powd (Polyethylene glycol 3350) .... Mix 17 g in 8 oz of juice or water 5)  Risperdal 0.25 Mg Tabs (Risperidone) .... One tab by mouth qhs 6)  Exelon 9.5 Mg/24hr Pt24 (Rivastigmine) .... Apply one patch daily 7)  Detrol La 4 Mg Xr24h-cap (Tolterodine tartrate) .... Take 1 tablet by mouth once a day 8)  Singulair 10 Mg Tabs (Montelukast sodium) .... One tab by mouth once daily 9)  Omeprazole 20 Mg Cpdr (Omeprazole) .... One cap by mouth qd 10)  Dorzolamide Hcl 2 % Soln (Dorzolamide hcl) .... Place 1 drop in ech eye every 12 hrs 11)  Docusate Sodium 100 Mg Caps (Docusate sodium) .... Take 1 tablet by mouth two times a day 12)  Restoril 15 Mg Caps (Temazepam) .... Take 1 tab by mouth at bedtime 13)  Klonopin 1 Mg Tabs (Clonazepam) .... Take 1/2 tablet twice a day and 1 at bedtime 14)  Tylenol Pm Extra Strength 500-25 Mg Tabs (Diphenhydramine-apap (sleep)) .... As directed 15)  Mylanta 200-200-20 Mg/75ml Susp (Alum & mag hydroxide-simeth) .... 2 to 4 tsp qid as needed 16)  Prednisolone Acetate 1 % Susp (Prednisolone acetate) .... One drop into right eye qid 17)  Tessalon Perles 100 Mg Caps (Benzonatate) .... Take 1 capsule by mouth three times a day 18)  Tums 500 Mg Chew (Calcium carbonate antacid) .... Two tabs by mouth every 4 to 6 hours as needed for gas  Other Orders: Re-certification of Home Health [phone])

## 2010-10-10 NOTE — Letter (Signed)
Summary: DC MEDICINE  DC MEDICINE   Imported By: Lind Guest 08/15/2010 13:31:26  _____________________________________________________________________  External Attachment:    Type:   Image     Comment:   External Document

## 2010-10-10 NOTE — Progress Notes (Signed)
  Phone Note From Pharmacy   Summary of Call: xalatan eye drops requires pa Initial call taken by: Adella Hare LPN,  December 08, 2009 5:52 PM  Follow-up for Phone Call        this is my second and final response this needs to go to the opthalmologist, pls speak dirctly with the pharmacy and facility so it gets taken care of Follow-up by: Syliva Overman MD,  December 08, 2009 10:18 PM  Additional Follow-up for Phone Call Additional follow up Details #1::        [facility] aware Additional Follow-up by: Adella Hare LPN,  December 12, 2009 10:16 AM

## 2010-10-10 NOTE — Assessment & Plan Note (Signed)
Summary: RECERT   Allergies: No Known Drug Allergies   Complete Medication List: 1)  Simvastatin 20 Mg Tabs (Simvastatin) .... Take 1 tab by mouth at bedtime 2)  Xalatan 0.005 % Soln (Latanoprost) .... One drop each eye at bedtime 3)  Amlodipine Besylate 5 Mg Tabs (Amlodipine besylate) .... One tab by mouth once daily 4)  Polyethylene Glycol 3350 Powd (Polyethylene glycol 3350) .... Mix 17 g in 8 oz of juice or water 5)  Risperdal 0.25 Mg Tabs (Risperidone) .... One tab by mouth qhs 6)  Exelon 9.5 Mg/24hr Pt24 (Rivastigmine) .... Apply one patch daily 7)  Singulair 10 Mg Tabs (Montelukast sodium) .... One tab by mouth once daily 8)  Omeprazole 20 Mg Cpdr (Omeprazole) .... One cap by mouth qd 9)  Docusate Sodium 100 Mg Caps (Docusate sodium) .... Take 1 tablet by mouth two times a day 10)  Klonopin 1 Mg Tabs (Clonazepam) .... Take 1/2 tablet twice a day and 1 at bedtime 11)  Tylenol Pm Extra Strength 500-25 Mg Tabs (Diphenhydramine-apap (sleep)) .... As directed 12)  Mylanta 200-200-20 Mg/45ml Susp (Alum & mag hydroxide-simeth) .... 2 to 4 tsp qid as needed 13)  Oxybutynin Chloride 5 Mg Xr24h-tab (Oxybutynin chloride) .... Take 1 tablet by mouth once a day 14)  Restoril 15 Mg Caps (Temazepam) .... Take 1 tab by mouth at bedtime 15)  Xalatan 0.005 % Soln (Latanoprost) .... Place one drop in eye at bedtime 16)  Sudafed 30 Mg Tabs (Pseudoephedrine hcl) .... One tablet daily as needed for excessive nasal drainage, maximum use is 3 times weekly 17)  Vitamin D (ergocalciferol) 50000 Unit Caps (Ergocalciferol) .... One capsule once weekly Prescriptions: VITAMIN D (ERGOCALCIFEROL) 50000 UNIT CAPS (ERGOCALCIFEROL) one capsule once weekly  #4 x 4   Entered and Authorized by:   Syliva Overman MD   Signed by:   Syliva Overman MD on 05/02/2010   Method used:   Electronically to        [facility] (retail)       [address]       [place], [place]  [address]       Ph: [phone]     Fax: [phone]   RxID:   [id number]

## 2010-10-10 NOTE — Assessment & Plan Note (Signed)
Summary: office visit   Vital Signs:  Patient profile:   77 year old male Height:      66.5 inches Weight:      174.75 pounds BMI:     27.88 Pulse rate:   86 / minute Pulse rhythm:   regular Resp:     18 per minute BP sitting:   110 / 80  (right arm) Cuff size:   regular  Vitals Entered By: Everitt Amber (September 15, 2009 11:19 AM)  Nutrition Counseling: Patient's BMI is greater than 25 and therefore counseled on weight management options. CC: Patient complaining of shortness of breath, having a hard time passing urine. Said he may get up at night but doesn't go during the day. Also trouble with bowels   Primary Care Provider:  Teon Hudnall  CC:  Patient complaining of shortness of breath and having a hard time passing urine. Said he may get up at night but doesn't go during the day. Also trouble with bowels.  History of Present Illness: Pt has  his chronic complaints unchanged. e sttaes he has trouble voiding, chronic abdominal pain and shortness of breath. He does not appear to be as animated as in the past.He c/o chronic back , and large joint pain. He has not fallen. His gait is affected by his arthritis. His apetite is good, and there is no report of diarreah or constipation being a problem at this time.  Current Medications (verified): 1)  Lipitor 20 Mg  Tabs (Atorvastatin Calcium) .... One Tab By Mouth At Bedtime 2)  Xalatan 0.005 %  Soln (Latanoprost) .... One Drop Each Eye At Bedtime 3)  Amlodipine Besylate 5 Mg  Tabs (Amlodipine Besylate) .... One Tab By Mouth Once Daily 4)  Polyethylene Glycol 3350  Powd (Polyethylene Glycol 3350) .... Mix 17 G in 8 Oz of Juice or Water 5)  Risperdal 0.25 Mg Tabs (Risperidone) .... One Tab By Mouth Qhs 6)  Exelon 9.5 Mg/24hr Pt24 (Rivastigmine) .... Apply One Patch Daily 7)  Detrol La 4 Mg Xr24h-Cap (Tolterodine Tartrate) .... Take 1 Tablet By Mouth Once A Day 8)  Singulair 10 Mg Tabs (Montelukast Sodium) .... One Tab By Mouth Once Daily 9)   Omeprazole 20 Mg Cpdr (Omeprazole) .... One Cap By Mouth Qd 10)  Dorzolamide Hcl 2 % Soln (Dorzolamide Hcl) .... Place 1 Drop in Ech Eye Every 12 Hrs 11)  Docusate Sodium 100 Mg Caps (Docusate Sodium) .... Take 1 Tablet By Mouth Two Times A Day 12)  Restoril 15 Mg Caps (Temazepam) .... Take 1 Tab By Mouth At Bedtime 13)  Dicyclomine Hcl 10 Mg Caps (Dicyclomine Hcl) .... Take 1 Capsule By Mouth Two Times A Day 14)  Klonopin 1 Mg Tabs (Clonazepam) .... Take 1/2 Tablet Twice A Day and 1 At Bedtime 15)  Tylenol Pm Extra Strength 500-25 Mg Tabs (Diphenhydramine-Apap (Sleep)) .... As Directed 16)  Mylanta 200-200-20 Mg/96ml Susp (Alum & Mag Hydroxide-Simeth) .... 2 To 4 Tsp Qid As Needed  Allergies (verified): No Known Drug Allergies  Review of Systems      See HPI General:  Complains of fatigue, malaise, and sleep disorder; denies chills and fever. Eyes:  Complains of vision loss-both eyes. ENT:  Denies nasal congestion, sinus pressure, and sore throat. CV:  Denies chest pain or discomfort, shortness of breath with exertion, and swelling of feet. Resp:  Denies cough and sputum productive. GI:  See HPI; Complains of abdominal pain; denies constipation, diarrhea, nausea, and vomiting. GU:  See HPI. MS:  See HPI. Neuro:  Complains of memory loss; denies headaches, seizures, and sensation of room spinning. Psych:  Complains of anxiety, depression, easily angered, irritability, and panic attacks; denies suicidal thoughts/plans and thoughts of violence. Endo:  Denies excessive thirst and excessive urination. Heme:  Denies abnormal bruising and bleeding. Allergy:  Denies hives or rash.  Physical Exam  General:  alert, well-nourished, and well-hydrated.  HEENT: No facial asymmetry,  EOMI, No sinus tenderness, TM's Clear, oropharynx  pink and moist.   Chest: Clear to auscultation bilaterally.  CVS: S1, S2, No murmurs, No S3.   Abd: Soft, superficial tenderness, diffused. no guarding or rebound..   ZO:[id number]  ROM spine, hips, shoulders and knees.  Ext: No edema.   CNS: CN 2-12 intact, power tone and sensation normal throughout.   Skin: Intact, no visible lesions or rashes.  Psych: Good eye contact, flat affect.  Memory loss, pt confused and delusional, not anxious or depressed appearing.     Impression & Recommendations:  Problem # 1:  ABDOMINAL PAIN, CHRONIC (ICD-789.00) Assessment Unchanged  Problem # 2:  INSOMNIA (ICD-780.52) Assessment: Improved  His updated medication list for this problem includes:    Restoril 15 Mg Caps (Temazepam) .Marland Kitchen... Take 1 tab by mouth at bedtime    Tylenol Pm Extra Strength 500-25 Mg Tabs (Diphenhydramine-apap (sleep)) .Marland Kitchen... As directed  Discussed sleep hygiene.   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 One tab by mouth at bedtime  Orders: T-Lipid Profile [id number]) T-Hepatic Function [id number])  Labs Reviewed: SGOT: 13 (06/06/2009)   SGPT: 11 (06/06/2009)   HDL:48 (06/06/2009), 42 (11/29/2008)  LDL:66 (06/06/2009), 68 (11/29/2008)  Chol:142 (06/06/2009), 139 (11/29/2008)  Trig:140 (06/06/2009), 146 (11/29/2008)  Problem # 5:  HYPERTENSION (ICD-401.9) Assessment: Improved  His updated medication list for this problem includes:    Amlodipine Besylate 5 Mg Tabs (Amlodipine besylate) ..... One tab by mouth once daily  Orders: T-Basic Metabolic Panel [id number])  BP today: 110/80 Prior BP: 140/80 (06/03/2009)  Labs Reviewed: K+: 4.9 (06/06/2009) Creat: : 1.18 (06/06/2009)   Chol: 142 (06/06/2009)   HDL: 48 (06/06/2009)   LDL: 66 (06/06/2009)   TG: 140 (06/06/2009)  Complete Medication List: 1)  Lipitor 20 Mg Tabs (Atorvastatin calcium) ....  One tab by mouth at bedtime 2)  Xalatan 0.005 % Soln (Latanoprost) .... One drop each eye at bedtime 3)  Amlodipine Besylate 5 Mg Tabs (Amlodipine besylate) .... One tab by mouth once daily 4)  Polyethylene Glycol 3350 Powd (Polyethylene glycol 3350) .... Mix 17 g in 8 oz of juice or water 5)  Risperdal 0.25 Mg Tabs (Risperidone) .... One tab by mouth qhs 6)  Exelon 9.5 Mg/24hr Pt24 (Rivastigmine) .... Apply one patch daily 7)  Detrol La 4 Mg Xr24h-cap (Tolterodine tartrate) .... Take 1 tablet by mouth once a day 8)  Singulair 10 Mg Tabs (Montelukast sodium) .... One tab by mouth once daily 9)  Omeprazole 20 Mg Cpdr (Omeprazole) .... One cap by mouth qd 10)  Dorzolamide Hcl 2 % Soln (Dorzolamide hcl) .... Place 1 drop in ech eye every 12 hrs 11)  Docusate Sodium 100 Mg Caps (Docusate sodium) .... Take 1 tablet by mouth two times a day 12)  Restoril 15 Mg  Caps (Temazepam) .... Take 1 tab by mouth at bedtime 13)  Dicyclomine Hcl 10 Mg Caps (Dicyclomine hcl) .... Take 1 capsule by mouth two times a day 14)  Klonopin 1 Mg Tabs (Clonazepam) .... Take 1/2 tablet twice a day and 1 at bedtime 15)  Tylenol Pm Extra Strength 500-25 Mg Tabs (Diphenhydramine-apap (sleep)) .... As directed 16)  Mylanta 200-200-20 Mg/44ml Susp (Alum & mag hydroxide-simeth) .... 2 to 4 tsp qid as needed 17)  Exelon 9.5 Mg/24hr Pt24 (Rivastigmine) .... Apply one patch daily  Other Orders: T-CBC w/Diff ([id number]) T-TSH ([phone])  Patient Instructions: 1)  F/U in 3.5 months 2)    3)  BMP prior to visit, ICD-9: 4)  Hepatic Panel prior to visit, ICD-9: 5)  Lipid Panel prior to visit, ICD-9:   fasting labs in 3.5 weeks 6)  TSH prior to visit, ICD-9: 7)  CBC w/ Diff prior to visit, ICD-9: 8)  stop dicyclomine due to non use. Prescriptions: EXELON 9.5 MG/24HR PT24 (RIVASTIGMINE) apply one patch daily  #30 x 11   Entered and Authorized by:   Syliva Overman MD   Signed by:   Syliva Overman MD on 09/21/2009    Method used:   Electronically to        [facility] (retail)       [address]       [place], [place]  [address]       Ph: [phone]       Fax: [phone]   RxID:   [id number]

## 2010-10-10 NOTE — Miscellaneous (Signed)
Summary: Home Care Report  Home Care Report   Imported By: Lind Guest 09/19/2009 09:23:42  _____________________________________________________________________  External Attachment:    Type:   Image     Comment:   External Document

## 2010-10-10 NOTE — Miscellaneous (Signed)
Summary: fl2  fl2   Imported By: Lind Guest 05/09/2010 13:04:31  _____________________________________________________________________  External Attachment:    Type:   Image     Comment:   External Document

## 2010-10-10 NOTE — Miscellaneous (Signed)
Summary: Home Care Report  Home Care Report   Imported By: Lind Guest 12/28/2009 10:40:04  _____________________________________________________________________  External Attachment:    Type:   Image     Comment:   External Document

## 2010-10-10 NOTE — Progress Notes (Signed)
  Phone Note From Pharmacy   Caller: [facility] Summary of Call: insurance will not cover temazepam can we substitute? Initial call taken by: Adella Hare LPN,  February 20, 2010 3:13 PM  Follow-up for Phone Call        change to hydroxyzine 25mg  one at night pls Follow-up by: Syliva Overman MD,  February 20, 2010 5:32 PM  Additional Follow-up for Phone Call Additional follow up Details #1::        med changed Additional Follow-up by: Adella Hare LPN,  February 21, 2010 8:39 AM    New/Updated Medications: HYDROXYZINE HCL 25 MG TABS (HYDROXYZINE HCL) one tab by mouth at bedtime Prescriptions: HYDROXYZINE HCL 25 MG TABS (HYDROXYZINE HCL) one tab by mouth at bedtime  #30 x 2   Entered by:   Adella Hare LPN   Authorized by:   Syliva Overman MD   Signed by:   Adella Hare LPN on 76/28/3151   Method used:   Electronically to        [facility] (retail)       [address]       [place], [place]  [address]       Ph: [phone]       Fax: [phone]   RxID:   [id number]

## 2010-10-10 NOTE — Letter (Signed)
Summary: consults  consults   Imported By: Lind Guest 01/20/2010 15:37:19  _____________________________________________________________________  External Attachment:    Type:   Image     Comment:   External Document

## 2010-10-10 NOTE — Miscellaneous (Signed)
Summary: Home Care Report  Home Care Report   Imported By: Lind Guest 06/23/2010 10:29:55  _____________________________________________________________________  External Attachment:    Type:   Image     Comment:   External Document

## 2010-10-10 NOTE — Assessment & Plan Note (Signed)
Summary: sick - room 1   Vital Signs:  Patient profile:   77 year old male Height:      66.5 inches Weight:      174.75 pounds BMI:     27.88 O2 Sat:      92 % on Room air Pulse rate:   89 / minute Resp:     16 per minute BP sitting:   110 / 60  (left arm)  Vitals Entered By: Adella Hare LPN (November 25, 2009 11:32 AM) CC: runny nose, short of breath Is Patient Diabetic? No Pain Assessment Patient in pain? no        Primary Provider:  simpson  CC:  runny nose and short of breath.  History of Present Illness: Pt is here today with c/o cough x 4-5 wks.  Sometimes prod.  No fever.  Denies nasal congestion, sore thrt  or fever.  Sometimes gets chills, but sounds like this is not unusual for him. He states that he has been shortness of breath today.  The driver who is with hiim states that she noticed that he seemed to have a hard time breathing when walking & moving around this morning too.  He also c/o abd pain.  Worse with meals.  In review of his prev visits it appears that this is a chronic problem.  No vomiting.  Current Medications (verified): 1)  Lipitor 20 Mg  Tabs (Atorvastatin Calcium) .... One Tab By Mouth At Bedtime 2)  Xalatan 0.005 %  Soln (Latanoprost) .... One Drop Each Eye At Bedtime 3)  Amlodipine Besylate 5 Mg  Tabs (Amlodipine Besylate) .... One Tab By Mouth Once Daily 4)  Polyethylene Glycol 3350  Powd (Polyethylene Glycol 3350) .... Mix 17 G in 8 Oz of Juice or Water 5)  Risperdal 0.25 Mg Tabs (Risperidone) .... One Tab By Mouth Qhs 6)  Exelon 9.5 Mg/24hr Pt24 (Rivastigmine) .... Apply One Patch Daily 7)  Detrol La 4 Mg Xr24h-Cap (Tolterodine Tartrate) .... Take 1 Tablet By Mouth Once A Day 8)  Singulair 10 Mg Tabs (Montelukast Sodium) .... One Tab By Mouth Once Daily 9)  Omeprazole 20 Mg Cpdr (Omeprazole) .... One Cap By Mouth Qd 10)  Dorzolamide Hcl 2 % Soln (Dorzolamide Hcl) .... Place 1 Drop in Ech Eye Every 12 Hrs 11)  Docusate Sodium 100 Mg Caps  (Docusate Sodium) .... Take 1 Tablet By Mouth Two Times A Day 12)  Restoril 15 Mg Caps (Temazepam) .... Take 1 Tab By Mouth At Bedtime 13)  Dicyclomine Hcl 10 Mg Caps (Dicyclomine Hcl) .... Take 1 Capsule By Mouth Two Times A Day 14)  Klonopin 1 Mg Tabs (Clonazepam) .... Take 1/2 Tablet Twice A Day and 1 At Bedtime 15)  Tylenol Pm Extra Strength 500-25 Mg Tabs (Diphenhydramine-Apap (Sleep)) .... As Directed 16)  Mylanta 200-200-20 Mg/64ml Susp (Alum & Mag Hydroxide-Simeth) .... 2 To 4 Tsp Qid As Needed 17)  Exelon 9.5 Mg/24hr Pt24 (Rivastigmine) .... Apply One Patch Daily  Allergies (verified): No Known Drug Allergies  Past History:  Past medical history reviewed for relevance to current acute and chronic problems.  Past Medical History: Reviewed history from 12/24/2007 and no changes required. Current Problems:  GLAUCOMA (ICD-365.9) ANXIETY (ICD-300.00) GERD (ICD-530.81) HYPERLIPIDEMIA (ICD-272.4) HYPERTENSION (ICD-401.9) ABDOMINAL PAIN, CHRONIC (ICD-789.00)  Review of Systems General:  Complains of chills; denies fever. ENT:  Denies earache, nasal congestion, postnasal drainage, sinus pressure, and sore throat. CV:  Complains of shortness of breath with exertion; denies chest  pain or discomfort and palpitations. Resp:  Complains of cough, shortness of breath, and wheezing. GI:  Complains of abdominal pain; denies nausea and vomiting.  Physical Exam  General:  Well-developed,well-nourished,in no acute distress; alert,appropriate and cooperative throughout examination Head:  Normocephalic and atraumatic without obvious abnormalities. No apparent alopecia or balding. Ears:  External ear exam shows no significant lesions or deformities.  Otoscopic examination reveals clear canals, tympanic membranes are intact bilaterally without bulging, retraction, inflammation or discharge. Hearing is grossly normal bilaterally. Nose:  External nasal examination shows no deformity or  inflammation. Nasal mucosa are pink and moist without lesions or exudates. Mouth:  Oral mucosa and oropharynx without lesions or exudates.  Neck:  No deformities, masses, or tenderness noted. Lungs:  Decreased BS bilat.  Unable to hear wheeze with ausc but pt appears to be having some difficulty breathing. Heart:  Distant sounds, difficult to ausc.  But no Murmur or extra sounds noted. Cervical Nodes:  No lymphadenopathy noted Psych:  normally interactive and good eye contact.     Impression & Recommendations:  Problem # 1:  DYSPNEA (ICD-786.05) Assessment New Due to pts syptoms, & low pulse ox in office I recommend pt be evaluated in ER. Discussed with pt & his driver he needs CXR, etc & this can be done & results to them more quickly in the ER.  As well as appropriate treatment given/started pending results. They agreed.  Problem # 2:  COUGH (ICD-786.2) Assessment: New  Problem # 3:  ABDOMINAL PAIN, CHRONIC (ICD-789.00) Assessment: Unchanged  Complete Medication List: 1)  Lipitor 20 Mg Tabs (Atorvastatin calcium) .... One tab by mouth at bedtime 2)  Xalatan 0.005 % Soln (Latanoprost) .... One drop each eye at bedtime 3)  Amlodipine Besylate 5 Mg Tabs (Amlodipine besylate) .... One tab by mouth once daily 4)  Polyethylene Glycol 3350 Powd (Polyethylene glycol 3350) .... Mix 17 g in 8 oz of juice or water 5)  Risperdal 0.25 Mg Tabs (Risperidone) .... One tab by mouth qhs 6)  Exelon 9.5 Mg/24hr Pt24 (Rivastigmine) .... Apply one patch daily 7)  Detrol La 4 Mg Xr24h-cap (Tolterodine tartrate) .... Take 1 tablet by mouth once a day 8)  Singulair 10 Mg Tabs (Montelukast sodium) .... One tab by mouth once daily 9)  Omeprazole 20 Mg Cpdr (Omeprazole) .... One cap by mouth qd 10)  Dorzolamide Hcl 2 % Soln (Dorzolamide hcl) .... Place 1 drop in ech eye every 12 hrs 11)  Docusate Sodium 100 Mg Caps (Docusate sodium) .... Take 1 tablet by mouth two times a day 12)  Restoril 15 Mg Caps  (Temazepam) .... Take 1 tab by mouth at bedtime 13)  Dicyclomine Hcl 10 Mg Caps (Dicyclomine hcl) .... Take 1 capsule by mouth two times a day 14)  Klonopin 1 Mg Tabs (Clonazepam) .... Take 1/2 tablet twice a day and 1 at bedtime 15)  Tylenol Pm Extra Strength 500-25 Mg Tabs (Diphenhydramine-apap (sleep)) .... As directed 16)  Mylanta 200-200-20 Mg/58ml Susp (Alum & mag hydroxide-simeth) .... 2 to 4 tsp qid as needed 17)  Exelon 9.5 Mg/24hr Pt24 (Rivastigmine) .... Apply one patch daily  Patient Instructions: 1)  Please schedule a follow-up appointment as needed. 2)  Please schedule a follow-up appointment in 1 month with Dr Lodema Hong. 3)  Pt will be transported to ER by driver for further evaluation.

## 2010-10-10 NOTE — Miscellaneous (Signed)
Summary: Home Care Report  Home Care Report   Imported By: Lind Guest 09/15/2009 13:23:40  _____________________________________________________________________  External Attachment:    Type:   Image     Comment:   External Document

## 2010-10-10 NOTE — Miscellaneous (Signed)
Summary: Home Care Report  Home Care Report   Imported By: Lind Guest 12/20/2009 09:30:06  _____________________________________________________________________  External Attachment:    Type:   Image     Comment:   External Document

## 2010-10-10 NOTE — Miscellaneous (Signed)
Summary: med update  Clinical Lists Changes  Medications: Removed medication of RESTORIL 15 MG CAPS (TEMAZEPAM) Take 1 tab by mouth at bedtime Removed medication of XALATAN 0.005 % SOLN (LATANOPROST) place one drop in eye at bedtime Changed medication from XALATAN 0.005 %  SOLN (LATANOPROST) one drop each eye at bedtime to XALATAN 0.005 %  SOLN (LATANOPROST) one drop in left eye at bedtime Added new medication of ALPHAGAN P 0.1 % SOLN (BRIMONIDINE TARTRATE) one drop in both eyes two times a day Added new medication of DORZOLAMIDE HCL-TIMOLOL MAL 22.3-6.8 MG/ML SOLN (DORZOLAMIDE HCL-TIMOLOL MAL) one drop in each eye two times a day

## 2010-10-10 NOTE — Letter (Signed)
Summary: misc  misc   Imported By: Lind Guest 01/20/2010 16:02:33  _____________________________________________________________________  External Attachment:    Type:   Image     Comment:   External Document

## 2010-10-10 NOTE — Miscellaneous (Signed)
Summary: FL 2  FL 2   Imported By: Lind Guest 04/27/2010 08:07:56  _____________________________________________________________________  External Attachment:    Type:   Image     Comment:   External Document

## 2010-10-10 NOTE — Assessment & Plan Note (Signed)
Summary: office visit   Vital Signs:  Patient profile:   77 year old male Height:      66.5 inches Weight:      171 pounds BMI:     27.28 O2 Sat:      92 % Pulse rate:   87 / minute Pulse rhythm:   regular Resp:     16 per minute BP sitting:   110 / 82  (right arm) Cuff size:   regular  Vitals Entered By: Everitt Amber LPN (March 31, 2010 10:07 AM)  Nutrition Counseling: Patient's BMI is greater than 25 and therefore counseled on weight management options. CC: nose has been continuously running and he takes claritin but it doesn't help the drainage   Primary Care Provider:  Dalynn Jhaveri  CC:  nose has been continuously running and he takes claritin but it doesn't help the drainage.  History of Present Illness: Reports  that he has been doing well. Denies recent fever or chills. Denies sinus pressure,  ear pain or sore throat.Constant clear nasal drainage Denies chest congestion, or cough productive of sputum. Denies chest pain, palpitations, PND, orthopnea or leg swelling. Chronic abdominal pain,no  nausea, vomitting, diarrhea or constipation. Denies change in bowel movements or bloody stool. Denies dysuria , frequency,  or hesitancy.  Denies headaches, vertigo, seizures. Denies depression, anxiety or insomnia. Denies  rash, lesions, or itch.     Allergies: No Known Drug Allergies  Past History:  Past medical, surgical, family and social histories (including risk factors) reviewed, and no changes noted (except as noted below).  Past Medical History: Reviewed history from 12/24/2007 and no changes required. Current Problems:  GLAUCOMA (ICD-365.9) ANXIETY (ICD-300.00) GERD (ICD-530.81) HYPERLIPIDEMIA (ICD-272.4) HYPERTENSION (ICD-401.9) ABDOMINAL PAIN, CHRONIC (ICD-789.00)  Past Surgical History: Hernia repair Prostate surgery Right eye surgery, laser 2005  left eye surgery for glaucom at [facility] 10/2009  Family History: Reviewed history from 12/24/2007 and no  changes required. Dad deceased Maom deceased  Sister 1 lung cancer Brother 2  Social History: Reviewed history from 12/24/2007 and no changes required. Disable Married 8 children Never Smoked Alcohol use-no Drug use-no  Review of Systems      See HPI General:  Complains of fatigue. Eyes:  Complains of vision loss-both eyes; recently had successful surgery at [facility] for glaucoma. ENT:  Complains of nasal congestion and postnasal drainage; uncontrolled cleaqr ant nasal drainage. MS:  Complains of joint pain, low back pain, mid back pain, and stiffness. Neuro:  Complains of memory loss; denies headaches, seizures, and sensation of room spinning; severe dementia. Psych:  Complains of anxiety, mental problems, and unusual visions or sounds; denies depression. Endo:  Denies excessive thirst and excessive urination. Heme:  Denies abnormal bruising and bleeding. Allergy:  Complains of seasonal allergies; denies hives or rash, itching eyes, and sneezing; uncontrolled allergy symptoms, nose constantly has clear drainage.  Physical Exam  General:  Well-developed,well-nourished,in no acute distress; alert,appropriate and cooperative throughout examination HEENT: No facial asymmetry,  EOMI, No sinus tenderness, TM's Clear, oropharynx  pink and moist. Erythema and edema of nasal mucosa with clear nasal drainage  Chest: Clear to auscultation bilaterally.  CVS: S1, S2, No murmurs, No S3.   Abd: Soft, Nontender.  MS: decreased ROM spine, hips, shoulders and knees.  Ext: No edema.   CNS: CN 2-12 intact, power tone and sensation normal throughout.   Skin: Intact, no visible lesions or rashes.  Psych: Good eye contact, normal affect.  Memory loss, both anxious and depressed appearing  Impression & Recommendations:  Problem # 1:  ALLERGIC RHINITIS CAUSE UNSPECIFIED (ICD-477.9) Assessment Deteriorated  His updated medication list for this problem includes:    Sudafed 30 Mg Tabs  (Pseudoephedrine hcl) ..... One tablet daily as needed for excessive nasal drainage, maximum use is 3 times weekly  Problem # 2:  ABDOMINAL PAIN, CHRONIC (ICD-789.00) Assessment: Unchanged  Problem # 3:  INSOMNIA (ICD-780.52) Assessment: Unchanged  His updated medication list for this problem includes:    Tylenol Pm Extra Strength 500-25 Mg Tabs (Diphenhydramine-apap (sleep)) .Marland Kitchen... As directed    Restoril 15 Mg Caps (Temazepam) .Marland Kitchen... Take 1 tab by mouth at bedtime  Discussed sleep hygiene.   Problem # 4:  HYPERTENSION (ICD-401.9) Assessment: Unchanged  His updated medication list for this problem includes:    Amlodipine Besylate 5 Mg Tabs (Amlodipine besylate) ..... 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 Take 1 tab by mouth at bedtime, need to locate more recent labs  Labs Reviewed: SGOT: 13 (06/06/2009)   SGPT: 11 (06/06/2009)   HDL:48 (06/06/2009), 42 (11/29/2008)  LDL:66 (06/06/2009), 68 (11/29/2008)  Chol:142 (06/06/2009), 139 (11/29/2008)  Trig:140 (06/06/2009), 146 (11/29/2008)  Complete Medication List: 1)  Simvastatin 20 Mg Tabs (Simvastatin) .... Take 1 tab by mouth at bedtime 2)  Xalatan 0.005 % Soln (Latanoprost) .... One drop each eye at bedtime 3)  Amlodipine Besylate 5 Mg Tabs (Amlodipine besylate) .... One tab by mouth once daily 4)  Polyethylene Glycol 3350 Powd (Polyethylene glycol 3350) .... Mix 17 g in 8 oz of juice or water 5)  Risperdal 0.25 Mg Tabs (Risperidone) .... One tab by mouth qhs 6)  Exelon  9.5 Mg/24hr Pt24 (Rivastigmine) .... Apply one patch daily 7)  Singulair 10 Mg Tabs (Montelukast sodium) .... One tab by mouth once daily 8)  Omeprazole 20 Mg Cpdr (Omeprazole) .... One cap by mouth qd 9)  Docusate Sodium 100 Mg Caps (Docusate sodium) .... Take 1 tablet by mouth two times a day 10)  Klonopin 1 Mg Tabs (Clonazepam) .... Take 1/2 tablet twice a day and 1 at bedtime 11)  Tylenol Pm Extra Strength 500-25 Mg Tabs (Diphenhydramine-apap (sleep)) .... As directed 12)  Mylanta 200-200-20 Mg/28ml Susp (Alum & mag hydroxide-simeth) .... 2 to 4 tsp qid as needed 13)  Oxybutynin Chloride 5 Mg Xr24h-tab (Oxybutynin chloride) .... Take 1 tablet by mouth once a day 14)  Restoril 15 Mg Caps (Temazepam) .... Take 1 tab by mouth at bedtime 15)  Xalatan 0.005 % Soln (Latanoprost) .... Place one drop in eye at bedtime 16)  Sudafed 30 Mg Tabs (Pseudoephedrine hcl) .... One tablet daily as needed for excessive nasal drainage, maximum use is 3 times weekly  Patient Instructions: 1)  Please schedule a follow-up appointment in 3 months. 2)  HAPPY bIRTDAY, ENJOY!!!! 3)  sudafed one daily as needed for excessive nasal drainage, max is three times weekly. 4)  no other med changes Prescriptions: SUDAFED 30 MG TABS (PSEUDOEPHEDRINE HCL) one tablet daily as needed for excessive nasal drainage, maximum use is 3 times weekly  #  12 x 4   Entered and Authorized by:   Syliva Overman MD   Signed by:   Syliva Overman MD on 03/31/2010   Method used:   Electronically to        [facility] (retail)       [address]       [place], [place]  [address]       Ph: [phone]       Fax: [phone]   RxID:   [id number]

## 2010-10-10 NOTE — Progress Notes (Signed)
  Phone Note Other Incoming   Caller: [facility] Summary of Call: need to send in script d/c detrol due to formularly, cjaange to oxybutynin, [pls write this on the script, I am faxing over, notify facility also pls Initial call taken by: Syliva Overman MD,  March 24, 2010 1:18 PM Caller: [facility]  Follow-up for Phone Call        faxed in with note to d/c detrol  Follow-up by: Everitt Amber LPN,  March 24, 2010 1:22 PM    New/Updated Medications: OXYBUTYNIN CHLORIDE 5 MG XR24H-TAB (OXYBUTYNIN CHLORIDE) Take 1 tablet by mouth once a day Prescriptions: OXYBUTYNIN CHLORIDE 5 MG XR24H-TAB (OXYBUTYNIN CHLORIDE) Take 1 tablet by mouth once a day  #30 x 4   Entered and Authorized by:   Syliva Overman MD   Signed by:   Syliva Overman MD on 03/24/2010   Method used:   Printed then faxed to ...       [facility] (retail)       [address]       [place], [place]  [address]       Ph: [phone]       Fax: [phone]   RxID:   [id number]

## 2010-10-10 NOTE — Assessment & Plan Note (Signed)
Summary: RECERT   Allergies: No Known Drug Allergies   Complete Medication List: 1)  Lipitor 20 Mg Tabs (Atorvastatin calcium) .... One tab by mouth at bedtime 2)  Xalatan 0.005 % Soln (Latanoprost) .... One drop each eye at bedtime 3)  Amlodipine Besylate 5 Mg Tabs (Amlodipine besylate) .... One tab by mouth once daily 4)  Polyethylene Glycol 3350 Powd (Polyethylene glycol 3350) .... Mix 17 g in 8 oz of juice or water 5)  Risperdal 0.25 Mg Tabs (Risperidone) .... One tab by mouth qhs 6)  Exelon 9.5 Mg/24hr Pt24 (Rivastigmine) .... Apply one patch daily 7)  Detrol La 4 Mg Xr24h-cap (Tolterodine tartrate) .... Take 1 tablet by mouth once a day 8)  Singulair 10 Mg Tabs (Montelukast sodium) .... One tab by mouth once daily 9)  Omeprazole 20 Mg Cpdr (Omeprazole) .... One cap by mouth qd 10)  Dorzolamide Hcl 2 % Soln (Dorzolamide hcl) .... Place 1 drop in ech eye every 12 hrs 11)  Docusate Sodium 100 Mg Caps (Docusate sodium) .... Take 1 tablet by mouth two times a day 12)  Restoril 15 Mg Caps (Temazepam) .... Take 1 tab by mouth at bedtime 13)  Dicyclomine Hcl 10 Mg Caps (Dicyclomine hcl) .... Take 1 capsule by mouth two times a day 14)  Klonopin 1 Mg Tabs (Clonazepam) .... Take 1/2 tablet twice a day and 1 at bedtime 15)  Tylenol Pm Extra Strength 500-25 Mg Tabs (Diphenhydramine-apap (sleep)) .... As directed 16)  Mylanta 200-200-20 Mg/5ml Susp (Alum & mag hydroxide-simeth) .... 2 to 4 tsp qid as needed 17)  Exelon 9.5 Mg/24hr Pt24 (Rivastigmine) .... Apply one patch daily recertification form reviewed and signed 

## 2010-10-10 NOTE — Assessment & Plan Note (Signed)
Summary: RECERT   Allergies: No Known Drug Allergies   Complete Medication List: 1)  Simvastatin 20 Mg Tabs (Simvastatin) .... Take 1 tab by mouth at bedtime 2)  Xalatan 0.005 % Soln (Latanoprost) .... One drop in left eye at bedtime 3)  Amlodipine Besylate 5 Mg Tabs (Amlodipine besylate) .... One tab by mouth once daily 4)  Polyethylene Glycol 3350 Powd (Polyethylene glycol 3350) .... Mix 17 g in 8 oz of juice or water 5)  Risperdal 0.25 Mg Tabs (Risperidone) .... One tab by mouth qhs 6)  Exelon 9.5 Mg/24hr Pt24 (Rivastigmine) .... Apply one patch daily 7)  Singulair 10 Mg Tabs (Montelukast sodium) .... One tab by mouth once daily 8)  Omeprazole 20 Mg Cpdr (Omeprazole) .... One cap by mouth qd 9)  Docusate Sodium 100 Mg Caps (Docusate sodium) .... Take 1 tablet by mouth two times a day 10)  Klonopin 1 Mg Tabs (Clonazepam) .... Take 1/2 tablet twice a day and 1 at bedtime 11)  Tylenol Pm Extra Strength 500-25 Mg Tabs (Diphenhydramine-apap (sleep)) .... As directed 12)  Mylanta 200-200-20 Mg/54ml Susp (Alum & mag hydroxide-simeth) .... 2 to 4 tsp qid as needed 13)  Oxybutynin Chloride 5 Mg Xr24h-tab (Oxybutynin chloride) .... Take 1 tablet by mouth once a day 14)  Sudafed 30 Mg Tabs (Pseudoephedrine hcl) .... One tablet daily as needed for excessive nasal drainage, maximum use is 3 times weekly 15)  Vitamin D (ergocalciferol) 50000 Unit Caps (Ergocalciferol) .... One capsule once weekly 16)  Hydroxyzine Hcl 25 Mg Tabs (Hydroxyzine hcl) .... Take 1 tab by mouth at bedtime 17)  Alphagan P 0.1 % Soln (Brimonidine tartrate) .... One drop in both eyes two times a day 18)  Dorzolamide Hcl-timolol Mal 22.3-6.8 Mg/ml Soln (Dorzolamide hcl-timolol mal) .... One drop in each eye two times a day  Other Orders: Re-certification of Home Health [phone])

## 2010-10-10 NOTE — Miscellaneous (Signed)
Summary: Home Care Report  Home Care Report   Imported By: Lind Guest 02/20/2010 14:26:07  _____________________________________________________________________  External Attachment:    Type:   Image     Comment:   External Document

## 2010-10-10 NOTE — Assessment & Plan Note (Signed)
Summary: RECERT   Allergies: No Known Drug Allergies   Complete Medication List: 1)  Lipitor 20 Mg Tabs (Atorvastatin calcium) .... One tab by mouth at bedtime 2)  Xalatan 0.005 % Soln (Latanoprost) .... One drop each eye at bedtime 3)  Amlodipine Besylate 5 Mg Tabs (Amlodipine besylate) .... One tab by mouth once daily 4)  Polyethylene Glycol 3350 Powd (Polyethylene glycol 3350) .... Mix 17 g in 8 oz of juice or water 5)  Risperdal 0.25 Mg Tabs (Risperidone) .... One tab by mouth qhs 6)  Exelon 9.5 Mg/24hr Pt24 (Rivastigmine) .... Apply one patch daily 7)  Detrol La 4 Mg Xr24h-cap (Tolterodine tartrate) .... Take 1 tablet by mouth once a day 8)  Singulair 10 Mg Tabs (Montelukast sodium) .... One tab by mouth once daily 9)  Omeprazole 20 Mg Cpdr (Omeprazole) .... One cap by mouth qd 10)  Dorzolamide Hcl 2 % Soln (Dorzolamide hcl) .... Place 1 drop in ech eye every 12 hrs 11)  Docusate Sodium 100 Mg Caps (Docusate sodium) .... Take 1 tablet by mouth two times a day 12)  Restoril 15 Mg Caps (Temazepam) .... Take 1 tab by mouth at bedtime 13)  Dicyclomine Hcl 10 Mg Caps (Dicyclomine hcl) .... Take 1 capsule by mouth two times a day 14)  Klonopin 1 Mg Tabs (Clonazepam) .... Take 1/2 tablet twice a day and 1 at bedtime 15)  Tylenol Pm Extra Strength 500-25 Mg Tabs (Diphenhydramine-apap (sleep)) .... As directed 16)  Mylanta 200-200-20 Mg/21ml Susp (Alum & mag hydroxide-simeth) .... 2 to 4 tsp qid as needed 17)  Exelon 9.5 Mg/24hr Pt24 (Rivastigmine) .... Apply one patch daily recertification form reviewed and signed

## 2010-10-10 NOTE — Assessment & Plan Note (Signed)
Summary: recert   Allergies: No Known Drug Allergies   Complete Medication List: 1)  Lipitor 20 Mg Tabs (Atorvastatin calcium) .... One tab by mouth at bedtime 2)  Xalatan 0.005 % Soln (Latanoprost) .... One drop each eye at bedtime 3)  Amlodipine Besylate 5 Mg Tabs (Amlodipine besylate) .... One tab by mouth once daily 4)  Polyethylene Glycol 3350 Powd (Polyethylene glycol 3350) .... Mix 17 g in 8 oz of juice or water 5)  Risperdal 0.25 Mg Tabs (Risperidone) .... One tab by mouth qhs 6)  Exelon 9.5 Mg/24hr Pt24 (Rivastigmine) .... Apply one patch daily 7)  Detrol La 4 Mg Xr24h-cap (Tolterodine tartrate) .... Take 1 tablet by mouth once a day 8)  Singulair 10 Mg Tabs (Montelukast sodium) .... One tab by mouth once daily 9)  Omeprazole 20 Mg Cpdr (Omeprazole) .... One cap by mouth qd 10)  Dorzolamide Hcl 2 % Soln (Dorzolamide hcl) .... Place 1 drop in ech eye every 12 hrs 11)  Docusate Sodium 100 Mg Caps (Docusate sodium) .... Take 1 tablet by mouth two times a day 12)  Restoril 15 Mg Caps (Temazepam) .... Take 1 tab by mouth at bedtime 13)  Dicyclomine Hcl 10 Mg Caps (Dicyclomine hcl) .... Take 1 capsule by mouth two times a day 14)  Klonopin 1 Mg Tabs (Clonazepam) .... Take 1/2 tablet twice a day and 1 at bedtime 15)  Tylenol Pm Extra Strength 500-25 Mg Tabs (Diphenhydramine-apap (sleep)) .... As directed 16)  Mylanta 200-200-20 Mg/66ml Susp (Alum & mag hydroxide-simeth) .... 2 to 4 tsp qid as needed recertification form reviewed and signed

## 2010-10-10 NOTE — Letter (Signed)
Summary: demo  demo   Imported By: Lind Guest 01/20/2010 15:32:40  _____________________________________________________________________  External Attachment:    Type:   Image     Comment:   External Document

## 2010-10-10 NOTE — Miscellaneous (Signed)
Summary: drug review  drug review   Imported By: Lind Guest 08/09/2010 10:01:56  _____________________________________________________________________  External Attachment:    Type:   Image     Comment:   External Document

## 2010-10-10 NOTE — Letter (Signed)
Summary: labs  labs   Imported By: Lind Guest 01/20/2010 15:40:28  _____________________________________________________________________  External Attachment:    Type:   Image     Comment:   External Document

## 2010-10-10 NOTE — Miscellaneous (Signed)
Summary: Home Care Report  Home Care Report   Imported By: Lind Guest 11/01/2009 09:47:27  _____________________________________________________________________  External Attachment:    Type:   Image     Comment:   External Document

## 2010-10-10 NOTE — Progress Notes (Signed)
  Phone Note From Pharmacy   Caller: [facility]* Summary of Call: refill request for hydroxyzine 25 but this is not on current med list Initial call taken by: Adella Hare LPN,  May 02, 2010 1:28 PM  Follow-up for Phone Call        deny Follow-up by: Syliva Overman MD,  May 03, 2010 4:56 AM

## 2010-10-10 NOTE — Letter (Signed)
Summary: history and physical  history and physical   Imported By: Lind Guest 01/20/2010 15:44:15  _____________________________________________________________________  External Attachment:    Type:   Image     Comment:   External Document

## 2010-10-10 NOTE — Progress Notes (Signed)
  Phone Note Call from Patient   Summary of Call: Jerilynn Som not covered on his insurance. Can you substitute with something else? RX care Initial call taken by: Everitt Amber LPN,  December 27, 2009 2:01 PM  Follow-up for Phone Call        d/c the order for the tessalon perles and also send a stamped d/c order to the rest home pls Follow-up by: Syliva Overman MD,  December 27, 2009 4:06 PM  Additional Follow-up for Phone Call Additional follow up Details #1::        rx care aware Additional Follow-up by: Adella Hare LPN,  December 27, 2009 4:07 PM

## 2010-10-10 NOTE — Letter (Signed)
Summary: office notes  office notes   Imported By: Lind Guest 01/20/2010 15:42:25  _____________________________________________________________________  External Attachment:    Type:   Image     Comment:   External Document

## 2010-10-12 NOTE — Assessment & Plan Note (Signed)
Summary: recert   Allergies: No Known Drug Allergies   Complete Medication List: 1)  Xalatan 0.005 % Soln (Latanoprost) .... One drop in left eye at bedtime 2)  Amlodipine Besylate 5 Mg Tabs (Amlodipine besylate) .... One tab by mouth once daily 3)  Polyethylene Glycol 3350 Powd (Polyethylene glycol 3350) .... Mix 17 g in 8 oz of juice or water 4)  Risperdal 0.25 Mg Tabs (Risperidone) .... One tab by mouth qhs 5)  Exelon 9.5 Mg/24hr Pt24 (Rivastigmine) .... Apply one patch daily 6)  Singulair 10 Mg Tabs (Montelukast sodium) .... One tab by mouth once daily 7)  Omeprazole 20 Mg Cpdr (Omeprazole) .... One cap by mouth qd 8)  Docusate Sodium 100 Mg Caps (Docusate sodium) .... Take 1 tablet by mouth two times a day 9)  Klonopin 1 Mg Tabs (Clonazepam) .... Take 1/2 tablet twice a day and 1 at bedtime 10)  Tylenol Pm Extra Strength 500-25 Mg Tabs (Diphenhydramine-apap (sleep)) .... As directed 11)  Mylanta 200-200-20 Mg/21ml Susp (Alum & mag hydroxide-simeth) .... 2 to 4 tsp qid as needed 12)  Oxybutynin Chloride 5 Mg Xr24h-tab (Oxybutynin chloride) .... Take 1 tablet by mouth once a day 13)  Sudafed 30 Mg Tabs (Pseudoephedrine hcl) .... One tablet daily as needed for excessive nasal drainage, maximum use is 3 times weekly 14)  Vitamin D (ergocalciferol) 50000 Unit Caps (Ergocalciferol) .... One capsule once weekly 15)  Hydroxyzine Hcl 25 Mg Tabs (Hydroxyzine hcl) .... Take 1 tab by mouth at bedtime 16)  Alphagan P 0.1 % Soln (Brimonidine tartrate) .... One drop in both eyes two times a day 17)  Dorzolamide Hcl-timolol Mal 22.3-6.8 Mg/ml Soln (Dorzolamide hcl-timolol mal) .... One drop in each eye two times a day 18)  Q-tussin Dm 100-10 Mg/65ml Syrp (Dextromethorphan-guaifenesin) .... One tbsp by mouth as needed for simple cough 19)  Lovastatin 40 Mg Tabs (Lovastatin) .... Take 1 tab by mouth at bedtime 20)  Acetazolamide 250 Mg Tabs (Acetazolamide) .... One tablet with meals and at  bedtime  Other Orders: Re-certification of Home Health [phone])   Orders Added: 1)  Re-certification of Home Health [G0179]

## 2010-10-12 NOTE — Progress Notes (Signed)
Summary: medication refills  Phone Note Other Incoming   Caller: caregiver Summary of Call: called in and said [facility]ed over request for patient's hydroxyzine hcl 25 mg, vit. d and his oxybutynin chloride.  Please advise. Initial call taken by: Curtis Sites,  September 06, 2010 10:56 AM    Prescriptions: VITAMIN D (ERGOCALCIFEROL) 50000 UNIT CAPS (ERGOCALCIFEROL) one capsule once weekly  #4 x 3   Entered by:   Adella Hare LPN   Authorized by:   Syliva Overman MD   Signed by:   Adella Hare LPN on 16/06/9603   Method used:   Electronically to        [facility]* (retail)       [address]       [place], [place]  [address]       Ph: [phone]       Fax: [phone]   RxID:   [id number]  others have been sent

## 2010-10-12 NOTE — Assessment & Plan Note (Signed)
Summary: ov   Vital Signs:  Patient profile:   77 year old male Height:      66.5 inches Weight:      174.25 pounds BMI:     27.80 O2 Sat:      96 % on Room air Pulse rate:   89 / minute Pulse rhythm:   regular Resp:     16 per minute BP sitting:   110 / 70  (left arm)  Vitals Entered By: Adella Hare LPN (August 15, 2010 8:37 AM)  Nutrition Counseling: Patient's BMI is greater than 25 and therefore counseled on weight management options.  O2 Flow:  Room air CC: follow-up visit Is Patient Diabetic? No Pain Assessment Patient in pain? no        Primary Care Provider:  simpson  CC:  follow-up visit.  History of Present Illness: Reports  that he is not doing well, he c/o abdominal pain and swelling and water running up in his chest Denies recent fever or chills. Denies sinus pressure, nasal congestion , ear pain or sore throat. Denies chest congestion, or cough productive of sputum. Denies chest pain, palpitations, PND, orthopnea or leg swelling.  Denies change in bowel movements or bloody stool. Denies dysuria or  frequency  Denies headaches, vertigo, seizures. Denies depression, anxiety or insomnia.Has mental halth problems with delusions. Denies  rash, lesions, or itch.     Current Medications (verified): 1)  Simvastatin 20 Mg Tabs (Simvastatin) .... Take 1 Tab By Mouth At Bedtime 2)  Xalatan 0.005 %  Soln (Latanoprost) .... One Drop in Left Eye At Bedtime 3)  Amlodipine Besylate 5 Mg  Tabs (Amlodipine Besylate) .... One Tab By Mouth Once Daily 4)  Polyethylene Glycol 3350  Powd (Polyethylene Glycol 3350) .... Mix 17 G in 8 Oz of Juice or Water 5)  Risperdal 0.25 Mg Tabs (Risperidone) .... One Tab By Mouth Qhs 6)  Exelon 9.5 Mg/24hr Pt24 (Rivastigmine) .... Apply One Patch Daily 7)  Singulair 10 Mg Tabs (Montelukast Sodium) .... One Tab By Mouth Once Daily 8)  Omeprazole 20 Mg Cpdr (Omeprazole) .... One Cap By Mouth Qd 9)  Docusate Sodium 100 Mg Caps  (Docusate Sodium) .... Take 1 Tablet By Mouth Two Times A Day 10)  Klonopin 1 Mg Tabs (Clonazepam) .... Take 1/2 Tablet Twice A Day and 1 At Bedtime 11)  Tylenol Pm Extra Strength 500-25 Mg Tabs (Diphenhydramine-Apap (Sleep)) .... As Directed 12)  Mylanta 200-200-20 Mg/38ml Susp (Alum & Mag Hydroxide-Simeth) .... 2 To 4 Tsp Qid As Needed 13)  Oxybutynin Chloride 5 Mg Xr24h-Tab (Oxybutynin Chloride) .... Take 1 Tablet By Mouth Once A Day 14)  Sudafed 30 Mg Tabs (Pseudoephedrine Hcl) .... One Tablet Daily As Needed For Excessive Nasal Drainage, Maximum Use Is 3 Times Weekly 15)  Vitamin D (Ergocalciferol) 50000 Unit Caps (Ergocalciferol) .... One Capsule Once Weekly 16)  Hydroxyzine Hcl 25 Mg Tabs (Hydroxyzine Hcl) .... Take 1 Tab By Mouth At Bedtime 17)  Alphagan P 0.1 % Soln (Brimonidine Tartrate) .... One Drop in Both Eyes Two Times A Day 18)  Dorzolamide Hcl-Timolol Mal 22.3-6.8 Mg/ml Soln (Dorzolamide Hcl-Timolol Mal) .... One Drop in Each Eye Two Times A Day 19)  Q-Tussin Dm 100-10 Mg/47ml Syrp (Dextromethorphan-Guaifenesin) .... One Tbsp By Mouth As Needed For Simple Cough  Allergies (verified): No Known Drug Allergies  Past History:  Past Surgical History: Hernia repair Prostate surgery Right eye surgery, laser 2005  left eye surgery for glaucom at [facility] 10/2009  Review of Systems      See HPI General:  Complains of fatigue. Eyes:  Denies discharge and red eye. MS:  Complains of joint pain, low back pain, mid back pain, and stiffness. Derm:  Denies itching and rash. Neuro:  Complains of difficulty with concentration, memory loss, and weakness; denies tingling and tremors. Psych:  Complains of anxiety, depression, irritability, and mental problems; denies suicidal thoughts/plans, thoughts of violence, and unusual visions or sounds. Endo:  Denies cold intolerance, excessive hunger, excessive thirst, and excessive urination. Heme:  Denies abnormal bruising, bleeding, and enlarge  lymph nodes. Allergy:  Denies hives or rash and itching eyes.  Physical Exam  General:  Well-developed,well-nourished,in no acute distress; alert,appropriate and cooperative throughout examination HEENT: No facial asymmetry,  EOMI, No sinus tenderness, TM's Clear, oropharynx  pink and moist. Erythema and edema of nasal mucosa with clear nasal drainage  Chest: Clear to auscultation bilaterally.  CVS: S1, S2, No murmurs, No S3.   Abd: Soft, Nontender.  MS: decreased ROM spine, hips, shoulders and knees.  Ext: No edema.   CNS: CN 2-12 intact, power tone and sensation normal throughout.   Skin: Intact, no visible lesions or rashes.  Psych: Good eye contact, normal affect.  Memory loss, not anxious nor depressed appearing   Impression & Recommendations:  Problem # 1:  ALLERGIC RHINITIS CAUSE UNSPECIFIED (ICD-477.9) Assessment Unchanged  His updated medication list for this problem includes:    Sudafed 30 Mg Tabs (Pseudoephedrine hcl) ..... One tablet daily as needed for excessive nasal drainage, maximum use is 3 times weekly  Problem # 2:  ABDOMINAL PAIN, CHRONIC (ICD-789.00) Assessment: Unchanged  Problem # 3:  HYPERTENSION (ICD-401.9) Assessment: Unchanged  His updated medication list for this problem includes:    Amlodipine Besylate 5 Mg Tabs (Amlodipine besylate) ..... One tab by mouth once daily    Acetazolamide 250 Mg Tabs (Acetazolamide) ..... One tablet with meals and at bedtime  Orders: T-Basic Metabolic Panel [phone])  BP today: 110/70 Prior BP: 110/82 (03/31/2010)  Labs Reviewed: K+: 3.9 (05/01/2010) Creat: : 1.29 (05/01/2010)   Chol: 170 (05/01/2010)   HDL: 41 (05/01/2010)   LDL: 85 (05/01/2010)   TG: 222 (05/01/2010)  Problem # 4:  HYPERLIPIDEMIA (ICD-272.4) Assessment: Comment Only  The following medications were removed from the medication list:    Simvastatin 20 Mg Tabs (Simvastatin) .Marland Kitchen... Take 1 tab by mouth at bedtime His updated medication list  for this problem includes:    Lovastatin 40 Mg Tabs (Lovastatin) .Marland Kitchen... Take 1 tab by mouth at bedtime  Orders: Medicare Electronic Prescription [phone]) T-Lipid Profile [phone]) T-Hepatic Function [phone])  Labs Reviewed: SGOT: 12 (05/01/2010)   SGPT: <8 U/L (05/01/2010)   HDL:41 (05/01/2010), 48 (06/06/2009)  LDL:85 (05/01/2010), 66 (06/06/2009)  Chol:170 (05/01/2010), 142 (06/06/2009)  Trig:222 (05/01/2010), 140 (06/06/2009)  Problem # 5:  BACK PAIN (ICD-724.5)  Complete Medication List: 1)  Xalatan 0.005 % Soln (Latanoprost) .... One drop in left eye at bedtime 2)  Amlodipine Besylate 5 Mg Tabs (Amlodipine besylate) .... One tab by mouth once daily 3)  Polyethylene Glycol 3350 Powd (Polyethylene glycol 3350) .... Mix 17 g in 8 oz of juice or water 4)  Risperdal 0.25 Mg Tabs (Risperidone) .... One tab by mouth qhs 5)  Exelon 9.5 Mg/24hr Pt24 (Rivastigmine) .... Apply one patch daily 6)  Singulair 10 Mg Tabs (Montelukast sodium) .... One tab by mouth once daily 7)  Omeprazole 20 Mg Cpdr (Omeprazole) .... One cap by mouth qd 8)  Docusate Sodium 100 Mg Caps (Docusate sodium) .... Take 1 tablet by mouth two times a day 9)  Klonopin 1 Mg Tabs (Clonazepam) .... Take 1/2 tablet twice a day and 1 at bedtime 10)  Tylenol Pm Extra Strength 500-25 Mg Tabs (Diphenhydramine-apap (sleep)) .... As directed 11)  Mylanta 200-200-20 Mg/31ml Susp (Alum & mag hydroxide-simeth) .... 2 to 4 tsp qid as needed 12)  Oxybutynin Chloride 5 Mg Xr24h-tab (Oxybutynin chloride) .... Take 1 tablet by mouth once a day 13)  Sudafed 30 Mg Tabs (Pseudoephedrine hcl) .... One tablet daily as needed for excessive nasal drainage, maximum use is 3 times weekly 14)  Vitamin D (ergocalciferol) 50000 Unit Caps (Ergocalciferol) .... One capsule once weekly 15)  Hydroxyzine Hcl 25 Mg Tabs (Hydroxyzine hcl) .... Take 1 tab by mouth at bedtime 16)  Alphagan P 0.1 % Soln (Brimonidine tartrate) .... One drop in both eyes two  times a day 17)  Dorzolamide Hcl-timolol Mal 22.3-6.8 Mg/ml Soln (Dorzolamide hcl-timolol mal) .... One drop in each eye two times a day 18)  Q-tussin Dm 100-10 Mg/27ml Syrp (Dextromethorphan-guaifenesin) .... One tbsp by mouth as needed for simple cough 19)  Lovastatin 40 Mg Tabs (Lovastatin) .... Take 1 tab by mouth at bedtime 20)  Acetazolamide 250 Mg Tabs (Acetazolamide) .... One tablet with meals and at bedtime  Patient Instructions: 1)  Please schedule a follow-up appointment in 4 months. 2)  You are doing well. 3)  Pls stop simvastatin , start lovastatin in its place. 4)  BMP prior to visit, ICD-9: 5)  Hepatic Panel prior to visit, ICD-9:  fasting in 4 months 6)  Lipid Panel prior to visit, ICD-9: Prescriptions: LOVASTATIN 40 MG TABS (LOVASTATIN) Take 1 tab by mouth at bedtime  #30 x 3   Entered and Authorized by:   Syliva Overman MD   Signed by:   Syliva Overman MD on 08/15/2010   Method used:   Printed then faxed to ...       [facility]* (retail)       [address]       [place], [place]  [address]       Ph: [phone]       Fax: [phone]   RxID:   [id number]    Orders Added: 1)  Est. Patient Level IV [72536] 2)  Medicare Electronic Prescription [G8553] 3)  T-Basic Metabolic Panel [[id number]] 4)  T-Lipid Profile [[id number]] 5)  T-Hepatic Function [phone]

## 2010-10-12 NOTE — Miscellaneous (Signed)
Summary: recert  recert   Imported By: Lind Guest 09/12/2010 13:50:12  _____________________________________________________________________  External Attachment:    Type:   Image     Comment:   External Document

## 2010-10-23 ENCOUNTER — Encounter: Payer: Self-pay | Admitting: Family Medicine

## 2010-10-23 ENCOUNTER — Ambulatory Visit (INDEPENDENT_AMBULATORY_CARE_PROVIDER_SITE_OTHER): Payer: PRIVATE HEALTH INSURANCE | Admitting: Family Medicine

## 2010-10-23 DIAGNOSIS — F0390 Unspecified dementia without behavioral disturbance: Secondary | ICD-10-CM

## 2010-10-23 DIAGNOSIS — I10 Essential (primary) hypertension: Secondary | ICD-10-CM

## 2010-10-23 DIAGNOSIS — E785 Hyperlipidemia, unspecified: Secondary | ICD-10-CM

## 2010-10-25 ENCOUNTER — Encounter: Payer: Self-pay | Admitting: Family Medicine

## 2010-11-01 NOTE — Miscellaneous (Signed)
Summary: recert  recert   Imported By: Lind Guest 10/25/2010 09:44:52  _____________________________________________________________________  External Attachment:    Type:   Image     Comment:   External Document

## 2010-11-01 NOTE — Assessment & Plan Note (Signed)
Summary: recert   Allergies: No Known Drug Allergies   Complete Medication List: 1)  Xalatan 0.005 % Soln (Latanoprost) .... One drop in left eye at bedtime 2)  Amlodipine Besylate 5 Mg Tabs (Amlodipine besylate) .... One tab by mouth once daily 3)  Polyethylene Glycol 3350 Powd (Polyethylene glycol 3350) .... Mix 17 g in 8 oz of juice or water 4)  Risperdal 0.25 Mg Tabs (Risperidone) .... One tab by mouth qhs 5)  Exelon 9.5 Mg/24hr Pt24 (Rivastigmine) .... Apply one patch daily 6)  Singulair 10 Mg Tabs (Montelukast sodium) .... One tab by mouth once daily 7)  Omeprazole 20 Mg Cpdr (Omeprazole) .... One cap by mouth qd 8)  Docusate Sodium 100 Mg Caps (Docusate sodium) .... Take 1 tablet by mouth two times a day 9)  Klonopin 1 Mg Tabs (Clonazepam) .... Take 1/2 tablet twice a day and 1 at bedtime 10)  Tylenol Pm Extra Strength 500-25 Mg Tabs (Diphenhydramine-apap (sleep)) .... As directed 11)  Mylanta 200-200-20 Mg/5ml Susp (Alum & mag hydroxide-simeth) .... 2 to 4 tsp qid as needed 12)  Oxybutynin Chloride 5 Mg Xr24h-tab (Oxybutynin chloride) .... Take 1 tablet by mouth once a day 13)  Sudafed 30 Mg Tabs (Pseudoephedrine hcl) .... One tablet daily as needed for excessive nasal drainage, maximum use is 3 times weekly 14)  Vitamin D (ergocalciferol) 50000 Unit Caps (Ergocalciferol) .... One capsule once weekly 15)  Hydroxyzine Hcl 25 Mg Tabs (Hydroxyzine hcl) .... Take 1 tab by mouth at bedtime 16)  Alphagan P 0.1 % Soln (Brimonidine tartrate) .... One drop in both eyes two times a day 17)  Dorzolamide Hcl-timolol Mal 22.3-6.8 Mg/ml Soln (Dorzolamide hcl-timolol mal) .... One drop in each eye two times a day 18)  Q-tussin Dm 100-10 Mg/5ml Syrp (Dextromethorphan-guaifenesin) .... One tbsp by mouth as needed for simple cough 19)  Lovastatin 40 Mg Tabs (Lovastatin) .... Take 1 tab by mouth at bedtime 20)  Acetazolamide 250 Mg Tabs (Acetazolamide) .... One tablet with meals and at  bedtime  Other Orders: Re-certification of Home Health (G0179)   Orders Added: 1)  Re-certification of Home Health [G0179] 

## 2010-11-06 ENCOUNTER — Telehealth (INDEPENDENT_AMBULATORY_CARE_PROVIDER_SITE_OTHER): Payer: Self-pay | Admitting: *Deleted

## 2010-11-16 NOTE — Progress Notes (Signed)
Summary: med  Phone Note Call from Patient   Summary of Call: pt has alot of congestion and nose keeps running. [facility] would like to get a order for mucusnex [phone] Initial call taken by: Rudene Anda,  November 06, 2010 8:45 AM  Follow-up for Phone Call        a script is sent in, please let them know Follow-up by: Syliva Overman MD,  November 06, 2010 12:19 PM  Additional Follow-up for Phone Call Additional follow up Details #1::        called patient and was told that rx had been sent to pharm.  Additional Follow-up by: Rudene Anda,  November 06, 2010 3:09 PM    New/Updated Medications: MUCINEX D 60-600 MG XR12H-TAB (PSEUDOEPHEDRINE-GUAIFENESIN) Take 1 tablet by mouth two times a day as needed for nasal congestion and cough Prescriptions: MUCINEX D 60-600 MG XR12H-TAB (PSEUDOEPHEDRINE-GUAIFENESIN) Take 1 tablet by mouth two times a day as needed for nasal congestion and cough  #60 x 2   Entered and Authorized by:   Syliva Overman MD   Signed by:   Syliva Overman MD on 11/06/2010   Method used:   Electronically to        [facility]* (retail)       [address]       [place], [place]  [address]       Ph: [phone]       Fax: [phone]   RxID:   [id number]

## 2010-12-13 ENCOUNTER — Encounter: Payer: Self-pay | Admitting: Family Medicine

## 2010-12-17 LAB — COMPREHENSIVE METABOLIC PANEL
ALT: 23 U/L (ref 0–53)
AST: 17 U/L (ref 0–37)
Albumin: 3.6 g/dL (ref 3.5–5.2)
Alkaline Phosphatase: 71 U/L (ref 39–117)
CO2: 30 mEq/L (ref 19–32)
Chloride: 104 mEq/L (ref 96–112)
Creatinine, Ser: 1.1 mg/dL (ref 0.4–1.5)
GFR calc Af Amer: >60 mL/min (ref >60)
GFR calc non Af Amer: >60 mL/min (ref >60)
Potassium: 4.4 mEq/L (ref 3.5–5.1)
Sodium: 137 mEq/L (ref 135–145)
Total Bilirubin: 0.4 mg/dL (ref 0.3–1.2)

## 2010-12-17 LAB — DIFFERENTIAL
Eosinophils Absolute: 0.2 10*3/uL (ref 0.0–0.7)
Eosinophils Relative: 3 % (ref 0–5)
Lymphocytes Relative: 28 % (ref 12–46)
Lymphs Abs: 1.7 10*3/uL (ref 0.7–4.0)
Monocytes Absolute: 0.5 10*3/uL (ref 0.1–1.0)
Monocytes Relative: 8 % (ref 3–12)

## 2010-12-17 LAB — URINE MICROSCOPIC-ADD ON

## 2010-12-17 LAB — CBC
MCV: 85.9 fL (ref 78.0–100.0)
Platelets: 176 10*3/uL (ref 150–400)
RBC: 4.38 MIL/uL (ref 4.22–5.81)
WBC: 6.1 10*3/uL (ref 4.0–10.5)

## 2010-12-17 LAB — URINALYSIS, ROUTINE W REFLEX MICROSCOPIC
Glucose, UA: NEGATIVE mg/dL
Ketones, ur: NEGATIVE mg/dL
Leukocytes, UA: NEGATIVE
Nitrite: NEGATIVE
Specific Gravity, Urine: 1.015 (ref 1.005–1.030)
pH: 6 (ref 5.0–8.0)

## 2010-12-18 ENCOUNTER — Ambulatory Visit: Payer: Self-pay | Admitting: Family Medicine

## 2010-12-20 LAB — URINALYSIS, ROUTINE W REFLEX MICROSCOPIC
Glucose, UA: NEGATIVE mg/dL
Ketones, ur: NEGATIVE mg/dL
Protein, ur: NEGATIVE mg/dL
pH: 6 (ref 5.0–8.0)

## 2010-12-20 LAB — URINE MICROSCOPIC-ADD ON

## 2010-12-23 ENCOUNTER — Other Ambulatory Visit: Payer: Self-pay | Admitting: Family Medicine

## 2010-12-24 LAB — LIPID PANEL
Cholesterol: 160 mg/dL (ref 0–200)
HDL: 40 mg/dL (ref >39)
Total CHOL/HDL Ratio: 4 Ratio
VLDL: 33 mg/dL (ref 0–40)

## 2010-12-24 LAB — HEPATIC FUNCTION PANEL
ALT: <8 U/L (ref 0–53)
AST: 12 U/L (ref 0–37)
Albumin: 4.2 g/dL (ref 3.5–5.2)
Bilirubin, Direct: 0.1 mg/dL (ref 0.0–0.3)
Total Bilirubin: 0.3 mg/dL (ref 0.3–1.2)

## 2010-12-24 LAB — BASIC METABOLIC PANEL
CO2: 22 mEq/L (ref 19–32)
Calcium: 10.4 mg/dL (ref 8.4–10.5)
Glucose, Bld: 99 mg/dL (ref 70–99)
Potassium: 4 mEq/L (ref 3.5–5.3)
Sodium: 141 mEq/L (ref 135–145)

## 2010-12-26 DIAGNOSIS — G8929 Other chronic pain: Secondary | ICD-10-CM

## 2010-12-26 DIAGNOSIS — R5381 Other malaise: Secondary | ICD-10-CM

## 2010-12-26 DIAGNOSIS — R5383 Other fatigue: Secondary | ICD-10-CM

## 2010-12-26 DIAGNOSIS — N3942 Incontinence without sensory awareness: Secondary | ICD-10-CM

## 2010-12-26 DIAGNOSIS — R52 Pain, unspecified: Secondary | ICD-10-CM

## 2011-01-23 NOTE — Consult Note (Signed)
NAME:  [name] [name], [name] [name]            ACCOUNT NO.:  [id number]   MEDICAL RECORD NO.:  [id number]          PATIENT TYPE:  AMB   LOCATION:  DAY                           FACILITY:  [facility]   PHYSICIAN:  R. Roetta Sessions, M.D. DATE OF BIRTH:  1930/12/02   DATE OF CONSULTATION:  08/11/2007  DATE OF DISCHARGE:                                 CONSULTATION   ADDENDUM:  I have reviewed a CT of the abdomen and pelvis with contrast  with Dr. Margo Aye, radiologist, today.  He describes Mr. Kittel's SMA,  celiac and IMA patent without any significant occlusion.  He does have  mild atherosclerotic disease.  There is no evidence of significant  problems with his mesentery that would cause abdominal pain.      Lorenza Burton, N.P.      Jonathon Bellows, M.D.  Electronically Signed    KJ/MEDQ  D:  08/12/2007  T:  08/12/2007  Job:  [id number]   cc:   Milus Mallick. Lodema Hong, M.D.  Fax: [phone]

## 2011-01-23 NOTE — Assessment & Plan Note (Signed)
NAME:  [name] [name], [name] [name]             CHART#:  [id number]   DATE:  10/16/2007                       DOB:  11-11-1930   CHIEF COMPLAINT:  Followup EGD.   SUBJECTIVE:  [name] [name] is a 77 year old male.  He underwent EGD with  [facility] dilatation for esophageal dysphagia and episodes of anorexia.  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  He had a small hiatal hernia  deformity of the antrum, volcano-like lesion in the bulb consistent with  pancreatic rest and a single area of lymphangiectasia in an otherwise  normal exam.  He has done well.  He denies any problems with dysphagia  at this time.  Denies any odynophagia.  His main concern today is that  he is constipated.  He is having to take Milk of Magnesia for his  constipation.  His weight has remained stable.  His appetite is good.  He has been recently evaluated by his primary care physician, Dr.  Lodema Hong, as well as by Dr. Jerre Simon for urology issues.   CURRENT MEDICATIONS:  See updated list from 10/16/2007.   ALLERGIES:  NO KNOWN DRUG ALLERGIES.   OBJECTIVE:  VITAL SIGNS:  Weight 156 pounds.  Height 66 inches.  Temp  98.2.  Blood pressure 122/82.  Pulse 80.  GENERAL:  He is a pleasant alert elderly African American male in no  acute distress.  He is oriented x2.  Sclerae clear, nonicteric.  Conjunctivae are pink.  Oropharynx pink and moist without any lesions.  CHEST:  Heart regular rate and rhythm.  Normal S1 and S2 without  murmurs, clicks, rubs, or gallops.  ABDOMEN:  Positive bowel sounds x4.  No bruits auscultated.  Soft,  nontender, and nondistended without palpable mass or hepatosplenomegaly.  No rebound tenderness or guarding.  EXTREMITIES:  With clubbing or edema.   ASSESSMENT:  [name] [name] is a 77 year old male with esophageal  dysphagia/cervical esophageal web status post dilatation, doing well.  Main complaint today is constipation.  We  will discontinue his lactulose  and try MiraLax at this point.   PLAN:  1. Discontinue lactulose.  2. MiraLax 17 g daily p.r.n. constipation.  3. Continue Colace daily.  4. He is to call us if he has any further problems.       Lorenza Burton, N.P.  Electronically Signed     R. Roetta Sessions, M.D.  Electronically Signed    KJ/MEDQ  D:  10/16/2007  T:  10/17/2007  Job:  [id number]   cc:   Milus Mallick. Lodema Hong, M.D.

## 2011-01-23 NOTE — H&P (Signed)
NAME:  DEMARQUEZ, CIOLEK            ACCOUNT NO.:  [id number]   MEDICAL RECORD NO.:  [id number]          PATIENT TYPE:  AMB   LOCATION:  DAY                           FACILITY:  [facility]   PHYSICIAN:  R. Roetta Sessions, M.D. DATE OF BIRTH:  03-23-1931   DATE OF ADMISSION:  DATE OF DISCHARGE:  LH                              HISTORY & PHYSICAL   REQUESTING PHYSICIAN:  Dr. Lodema Hong.   CHIEF COMPLAINT:  Abdominal pain and weight loss.   HISTORY OF PRESENT ILLNESS:  [name] [name] is a 77 year old African-  American male.  He has been evaluated extensively by Dr. Jena Gauss  previously.  He was last seen August 12, 2006.  He has history of  chronic constipation and colonic polyps and recurrent chronic abdominal  pain.  He presents with his caregiver today.  He states that he can not  get his water to his bladder.  He feels at times that food goes down  one side of his abdomen and not the other.  He complains of left upper  quadrant and left lower quadrant abdominal pain.  His caregiver  describes this as the same type of pain he has had for years.  He denies  any nausea or vomiting, denies any heartburn or indigestion.  He has had  a full urology exam, per Dr. Anthony Sar notes.  He denies any anorexia.  He has lost 11 pounds in the past year.  He denies any fevers or chills.  He is complaining that my food does not go all the way down; however,  the caregiver says that she has not noticed any problems with his  swallowing.  He is taking lactulose 30 mL daily, as well as Colace once  or twice daily for constipation.  He does have history of chronic  constipation.   He was seen by Dr. Lovell Sheehan last week, after he was found to have a left  inguinal the fat-containing hernia.  He was felt by Dr. Lovell Sheehan that he  should not pursue any further surgical evaluation at this time for his  hernia.  He is an brought lab work today from July 08, 2007 which  shows a normal CBC, normal LFTs and a normal PSA.   He did have a CT scan  on July 24, 2007.  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  He had a colonoscopy by Dr. Jena Gauss on May 30, 2006 for surveillance, with history of adenomatous polyps.  He was  found to have a few scattered pan-colonic diverticula and otherwise  normal exam.  He had an EGD on April 19, 2004 by Dr. Jena Gauss for chronic  GERD, dysphagia, cervical esophageal web history.  He was found to have  a normal-appearing esophagus which was dilated with a 58-French dilator,  an area of geographic mucosal abnormality  along the lesser curvature  which was biopsied as chronic gastritis with intestinal metaplasia and  an otherwise normal EGD.   PAST MEDICAL AND SURGICAL HISTORY:  See HPI for last colonoscopy and EGD  report.  He had chronic GERD, history of adenomatous polyps,  hypertension, glaucoma, insomnia, anxiety, chronic constipation,  hypercholesterolemia, BPH, status post TURP, umbilical hernia repair.   CURRENT MEDICATIONS:  1. Klonopin was 0.5 mg b.i.d. and h.s.  2. Darvocet N-100 p.r.n.  3. Milk of Magnesia p.r.n., 60 mL p.r.n.  4. Lipitor 20 mg q.h.s.  5. Norvasc 5 mg daily.  6. Protonix 40 mg daily.  7. Xalatan eye drops 0.5% to each eye daily.  8. Alphagan 50% drops q.12 h.  9. Cosopt one drop in each eye t.i.d.  10.Uroxatral 10 mg daily.  11.Claritin 10 mg daily.  12.Sanctura 20 mg b.i.d.  13.Lactulose 30 mL daily.  14.Colace 100 mg b.i.d.  15.Flora-Q once daily.   ALLERGIES:  NO KNOWN DRUG ALLERGIES.   FAMILY HISTORY:  Mother and father both died of cancer in their 7s.  Brother died of prostate cancer, two sisters with history of  breast  cancer, and brother and sister who have died from liver cancer.   SOCIAL HISTORY:  Mr. Schanz has 8 children.  He is retired from  [employer].  He quit smoking in 2002.  Denies any alcohol or  drug use.  He is a widower.   REVIEW OF SYSTEMS:  See HPI.  GU:  He is complaining of straining,  increased urinary frequency.  He does have history of BPH and has been  extensively evaluated by urology, as stated above.   PHYSICAL EXAM:  VITAL SIGNS: Weight 156 pounds, 106 inches,  temperature  98 degrees, blood pressure 120/8, pulse 80.  GENERAL:  [name] [name] is an elderly African-American male who is  alert.  He is oriented to himself.  He is accompanied by his caregiver  today.  He is pleasant, cooperative.  HEENT:  Sclerae clear, nonicteric.  Conjunctivae pink.  Oropharynx pink  and moist without lesions.  NECK:  Supple without any mass or thyromegaly.  CHEST/HEART:  Regular rate and rhythm, normal S1-S2 without any murmurs,  clicks, rubs or gallops.  LUNGS:  Clear to auscultation bilaterally.  ABDOMEN:  Positive bowel sounds x4.  No bruits auscultated.  Abdomen is  mildly tender into the left lower quadrant and deep palpation.  There is  no rebound tenderness or guarding.  No hepatosplenomegaly or mass.  EXTREMITIES:  Without clubbing or edema bilaterally.   IMPRESSION:  Mr. Carruthers is a 77 year old male with a longstanding  history of chronic abdominal pain.  He does have a documented 11-pound  weight loss in the past year, although caregiver notes that he does seem  to have a good appetite.  He does report occasionally having episodes of  dysphagia and has a history of complicated GERD and esophageal web.  He  complains of a difficult to ascertain left-sided abdominal pain.  There  is no palpable herniation on exam; however, he is found to have a left  inguinal hernia on CT.  He has been seen by Dr. Lovell Sheehan for this.  Interestingly, he has had migratory pain  for the interview of his chart  for several years now.  He does have chronic constipation, which could  be part of the culprit.  I feel mesenteric ischemia is less likely,  given his symptoms, although we may need to review his  CT to look at the  mesentery, given his age.   PLAN:  1. Continue Protonix 4 mg daily.  2. EGD with possible ED with Dr. Jena Gauss in the near future.  I      discussed the procedure including risks and      benefits, included but not limited to bleeding, infection,      perforation, drug reaction, increased pain.  Consent will be      obtained.  3. Will have radiologist's review CT to look at Mr. Calabria's      mesentery.      Lorenza Burton, N.P.      Jonathon Bellows, M.D.  Electronically Signed    KJ/MEDQ  D:  08/11/2007  T:  08/11/2007  Job:  [id number]   cc:   Milus Mallick. Lodema Hong, M.D.  Fax: [phone]

## 2011-01-23 NOTE — Op Note (Signed)
NAME:  [name] [name], [name] [name]            ACCOUNT NO.:  [id number]   MEDICAL RECORD NO.:  [id number]          PATIENT TYPE:  AMB   LOCATION:  DAY                           FACILITY:  [facility]   PHYSICIAN:  R. Roetta Sessions, M.D. DATE OF BIRTH:  06-16-1931   DATE OF PROCEDURE:  08/18/2007  DATE OF DISCHARGE:                               OPERATIVE REPORT   PROCEDURE:  Esophagogastroduodenoscopy with Elease Hashimoto dilation.   INDICATIONS FOR PROCEDURE:  77 year old gentleman with 11 pounds weight  loss over this past year with intermittent episodes of anorexia and  episodic esophageal dysphagia to solids.  He has a history esophageal  web, has also had some nonspecific abdominal pain which have been worked  up fairly extensively.  He has seen Dr. Lovell Sheehan recently had abdominal  CT.  EGD is now being done.  This approach has discussed the patient at  length.  Potential risks, benefits, alternatives have been reviewed,  questions answered.  He is agreeable.  Please see the documentation in  medical record.   PROCEDURE NOTE:  O2 saturation, blood pressure, pulse and respirations  monitored throughout entire procedure.   CONSCIOUS SEDATION:  Versed 2 mg IV, Demerol 50 mg IV in a single dose.  Cetacaine spray for topical pharyngeal anesthesia.   INSTRUMENT:  Pentax video chip system.   FINDINGS:  Examination of the tubular esophagus revealed no obvious  abnormalities.  The esophagus appeared widely patent.  There was no  esophagitis neoplasm.  EG junction easily traversed.  Stomach:  Gastric cavity was empty, insufflated well with air.  Thorough  examination gastric mucosa including retroflex view of the stomach  reveals small hiatal hernia.  There is some deformity of the antrum but  no ulcer or infiltrating process.  Gastric mucosa otherwise appeared  normal.  The pylorus was easily traversed.  Examination of the bulb and  second portion revealed a small volcano like lesion in the bulb  consistent  with pancreatic rest and a little white speckled lesion most  consistent with an area of lymphangiectasia, otherwise D1-D2 appeared  normal.   THERAPEUTIC/DIAGNOSTIC MANEUVERS PERFORMED:  The scope was pulled out  and a 58-French Maloney dilator was advanced fully with ease.  A look  back revealed was some superficial mucosal disruption to the UES mucosa,  otherwise no apparent complication related passage of dilator.  The  patient tolerated the procedure well, was reacted in endoscopy.   IMPRESSION:  1. Probable occult cervical esophageal web status post dilation      disruption described above, otherwise normal esophagus.  2. Small hiatal hernia, deformity of antrum, otherwise normal stomach.      Volcano like lesion in the bulb consistent with pancreatic rest and      single area of lymphangiectasia otherwise D1-D2 appeared normal.   RECOMMENDATIONS:  Continue Protonix 40 grams orally daily.  Will plan to  see this nice gentleman back in 8 weeks, get him reweighed, and assess  his progress.      Jonathon Bellows, M.D.  Electronically Signed     RMR/MEDQ  D:  08/18/2007  T:  08/18/2007  Job:  [id number]   cc:   Milus Mallick. Lodema Hong, M.D.  Fax: [phone]   Dalia Heading, M.D.  Fax: [phone]

## 2011-01-23 NOTE — Assessment & Plan Note (Signed)
NAMEMarland Kitchen  CORBITT, CLOKE             CHART#:  [id number]   DATE:  06/24/2008                       DOB:  1930/11/14   CHIEF COMPLAINT:  Followup abdominal pain.   PROBLEM LIST:  1. Chronic complaints of abdominal pain and delusional thinking in the      way that food is going to one side of his body or not passing      through his body or he is passing or retaining liquids that he      drinks.  2. EGD with [facility] dilatation for esophageal dysphagia and history of      esophageal web with 58-French Maloney dilator.  3. Chronic constipation, has responded to MiraLax and Flora-Q.  4. Dementia.  5. Hypertension.  6. Hyperlipidemia.  7. Gastroesophageal reflux disease.  8. Chronic anxiety.  9. Glaucoma.  10.Right inguinal hernia repair.   SUBJECTIVE:  The patient is a 77 year old Caucasian male.  He has  history of chronic abdominal complaint.  He was seen by Dr. Lodema Hong and  evaluated.  He was sent for a CT scan on June 18, 2008.  He was found  to have constipation, bilateral pleural plaques felt to be due to prior  asbestos exposure.  Soft tissue did seem inferior to the cecum similar  over previous exams, it felt to be due to previous right inguinal hernia  repair.  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  He has had blood work done through  Dr. Anthony Sar office, but I do not have this available to me at this  time.  He has been using MiraLax daily for his constipation and is doing  well.  He is also using Flora-Q and hyoscyamine.  His main concern is  that his food is not getting digested completely.  He tells me that the  food goes to either one side of the stomach or the other and it should  be somewhere else.  He also is concerned that he is not getting his  water out.  He is followed by Urology as well.  He did have microscopic  hematuria.     The staff from his Care Home tells me that he is eating well.  He  is  having good bowel movements.  He is not having chronic complaints of  abdominal pain that are persisted throughout the day.  He has not had  any rectal bleeding or melena nor nausea or vomiting.   CURRENT MEDICATIONS:  See the list from June 24, 2008.   ALLERGIES:  No known drug allergies.   OBJECTIVE:  VITAL SIGNS:  Weight is 163 pounds, height 66 inches,  temperature 98.2, blood pressure 130/90, and pulse 80.  GENERAL:  He is a pleasant, demented, elderly African American male, who  is cooperative and in no acute distress.  HEENT:  Sclerae clear, nonicteric.  Conjunctivae pink.  Oropharynx pink  and moist without any lesions.  CHEST:  Heart, regular rate and rhythm.  Normal S1 and S2 without any  murmurs, clicks, rubs, or gallops.  ABDOMEN:  Positive bowel sounds x4.  No bruits auscultated.  Soft,  nontender, nondistended.  No palpable mass or hepatosplenomegaly.  No  rebound, tenderness, or guarding.  EXTREMITIES:  Without edema bilaterally.   ASSESSMENT:  The patient is  a 77 year old male with chronic constipation  which has responded well to MiraLax daily as well as stool softeners and  Flora-Q, would continue this regimen.   As far as his chronic abdominal pain, I would suspect much of his  symptomatology is due to dementia with delusional thinking.  Etiology of  significant gastrointestinal problems have not been found.   PLAN:  1. Continue MiraLax 17 g daily.  2. Continue Flora-Q.  3. Continue hyoscyamine q.i.d.  4. He is to follow up with his primary care doctor.  5. Continue Protonix 40 mg daily.  6. If there are any further bouts of significant abdominal pain, he      will call us or we will have the Care Home call us.       Lorenza Burton, N.P.  Electronically Signed     R. Roetta Sessions, M.D.  Electronically Signed    KJ/MEDQ  D:  06/24/2008  T:  06/25/2008  Job:  [id number]   cc:   Milus Mallick. Lodema Hong, M.D.

## 2011-01-26 NOTE — Op Note (Signed)
NAME:  [name] [name], [name] [name]                      ACCOUNT NO.:  [id number]   MEDICAL RECORD NO.:  [id number]                   PATIENT TYPE:  AMB   LOCATION:  DAY                                  FACILITY:  [facility]   PHYSICIAN:  Gerrit Friends. Rourk, M.D.               DATE OF BIRTH:  11-17-1930   DATE OF PROCEDURE:  06/11/2002  DATE OF DISCHARGE:                                 OPERATIVE REPORT   PROCEDURE:  Esophagogastroduodenoscopy with Elease Hashimoto dilation.   ENDOSCOPIST:  Gerrit Friends. Rourk, M.D.   INDICATIONS FOR PROCEDURE:  The patient is a 77 year old gentleman with  recent esophageal dysphagia.  Dr. Felecia Shelling performed a barium esophagram which  demonstrated a thin, cervical esophageal web. Barium pill was not impeded at  this level.  EGD is now being done.  This approach has been discussed with  the patient.  The potential risks, benefits, and alternatives have been  reviewed; and questions answered.  He is agreeable.  I feel that this  patient is at low risk for conscious sedation.   DESCRIPTION OF PROCEDURE:  O2 saturation, blood pressure, pulse and  respirations were monitored throughout the entire procedure.   CONSCIOUS SEDATION:  Versed 2 mg IV, Demerol 50 mg IV.   INSTRUMENT:  Olympus video chip adult gastroscope.   FINDINGS:  Examination of the tubular esophagus revealed no mucosal  abnormalities.  I was unable to identify the cervical web and the scope was  easily passed all the way down to the stomach.  The mucosa appeared entirely  normal.   STOMACH:  The gastric cavity was empty.  It insufflated well with air.  A  thorough examination of the gastric mucosa including a retroflex view of the  proximal stomach and esophagogastric junction demonstrated no abnormalities.  The pylorus was patent and easily traversed.   DUODENUM:  The bulb and the second portion appeared normal.   THERAPY/DIAGNOSTIC MANEUVERS:  A 56 French Maloney dilator was passed to  full insertion and  subsequently a 60 [place] Maloney dilator was passed fully  with ease.  A look back into the esophagus and stomach revealed no apparent  complication related to the passage of the dilator. The esophageal limb was  widely patent.   The patient tolerated the procedure well and was reacted in endoscopy.   IMPRESSION:  1. Endoscopically normal appearing tubular esophagus, normal stomach, normal     D1, D2.  2. Status post dilation up to a #58 [place] size with [facility] dilators.   DISCUSSION:  Hopefully this lady's symptoms will subside.   RECOMMENDATIONS:  1. Advance diet as tolerated.  2. If dysphagia does not improve he is to let me know.  Gerrit Friends. Rourk, M.D.    RMR/MEDQ  D:  06/11/2002  T:  06/11/2002  Job:  [id number]

## 2011-01-26 NOTE — Op Note (Signed)
NAME:  [name] [name], [name] [name]                      ACCOUNT NO.:  [id number]   MEDICAL RECORD NO.:  [id number]                   PATIENT TYPE:  AMB   LOCATION:  DAY                                  FACILITY:  [facility]   PHYSICIAN:  R. Roetta Sessions, M.D.              DATE OF BIRTH:  1930/09/13   DATE OF PROCEDURE:  04/19/2004  DATE OF DISCHARGE:                                 OPERATIVE REPORT   PROCEDURE:  Esophagogastroduodenoscopy with Elease Hashimoto dilation followed by  biopsy.   INDICATIONS FOR PROCEDURE:  The patient is a 77 year old gentleman with a  history of chronic gastroesophageal reflux disease, a couple-month history  of dysphagia, history of cervical esophageal web dilated with a 40 French  Maloney dilator back in 2001, with marked improvement in symptoms until  recently.  EGD is now being done to reaccess dysphagia.  Please see the  documentation in the medical record.   PROCEDURE:  O2 saturation, blood pressure, pulses, and respirations were  monitored throughout the entire procedure.  Conscious sedation was with  Versed 3 mg IV, Demerol 75 mg IV in divided doses.  The instrument used was  the Olympus video chip system.   FINDINGS:  Examination of the tubular esophagus revealed no mucosal  abnormalities.  I did not detect any obvious ring, web, Zenker's or other  structural abnormality.  The EG junction was easily traversed.   Stomach:  The gastric cavity was empty and insufflated well with air.  Thorough examination of the gastric mucosa including retroflex view of the  proximal stomach and esophagogastric junction was undertaken.  There was an  area of geographic inflammation with a change in level of the mucosa in the  area of the lesser curvature.  Please see photos.  It did not really appear  to be particularly punched out or ulcerated, but it was an area of notable  abnormality.  The pylorus was patent and easily traversed.   Duodenum:  Examination of the bulb and second  portion revealed no  abnormalities.   THERAPEUTIC/DIAGNOSTIC MANEUVERS PERFORMED:  A 58 French Maloney dilator was  passed to full insertion with ease.  A look back revealed no apparent  complication related to passage of the dilator.  Subsequently, biopsies of  the lesser curvature mucosa which appeared abnormal, as described above,  were taken.  The patient tolerated the procedure well and was reactive in  endoscopy.   IMPRESSION:  1. Normal-appearing esophagus, status post dilation, as described above.  2. Area of geographic mucosal abnormality along the lesser curvature, as     described above, biopsied.  The remainder of the gastric mucosa appeared     normal.  3. Normal first and second portions of the duodenum.   RECOMMENDATIONS:  1. Will plan to see this gentleman back in six to eight weeks.  If he has     persisting symptoms of cervical dysphagia,     he  will need further studies, starting with a barium pill esophagogram     along with possible ENT evaluation.  2. Follow up on pathology.  3. Further recommendations to follow.      ___________________________________________                                            Jonathon Bellows, M.D.   RMR/MEDQ  D:  04/19/2004  T:  04/19/2004  Job:  [id number]   cc:   Tesfaye D. Felecia Shelling, M.D.  [address]  [place]  [place] [address]  Fax: [phone]

## 2011-01-26 NOTE — Discharge Summary (Signed)
   NAME:  [name] [name], [name] [name]                      ACCOUNT NO.:  [id number]   MEDICAL RECORD NO.:  [id number]                   PATIENT TYPE:  INP   LOCATION:  A310                                 FACILITY:  [facility]   PHYSICIAN:  Tesfaye D. Felecia Shelling, M.D.              DATE OF BIRTH:  [birth year]   DATE OF ADMISSION:  03/21/2003  DATE OF DISCHARGE:  03/23/2003                                 DISCHARGE SUMMARY   DISCHARGE DIAGNOSES:  1. Abdominal pain secondary to chronic constipation.  2. Gastroesophageal reflux disease.  3. Urinary tract infection.  4. Chronic abdominal pain.  5. Hypertension.   DISPOSITION:  The patient was discharged home in a stable condition.   HOSPITAL COURSE:  This is a 77 year old male patient with a history of  hypertension, gastroesophageal reflux disease and a chronic abdominal pain  who was admitted due to worsening of abdominal pain, constipation and  urinary retention.  The patient was started on pain medication.  He was  given an enema and laxatives.  Urology patient was also catheterized and his  urinary retention was relieved.   The patient improved and he was discharged home to continue to his regular  medications.  Appointment also was made to follow with urologist.                                               Ninetta Lights D. Felecia Shelling, M.D.    TDF/MEDQ  D:  04/27/2003  T:  04/27/2003  Job:  [id number]

## 2011-01-26 NOTE — H&P (Signed)
NAME:  [name] [name], [name] [name]            ACCOUNT NO.:  [id number]   MEDICAL RECORD NO.:  [id number]          PATIENT TYPE:  AMB   LOCATION:  DAY                           FACILITY:  [facility]   PHYSICIAN:  R. Roetta Sessions, M.D. DATE OF BIRTH:  July 18, 1931   DATE OF ADMISSION:  DATE OF DISCHARGE:  LH                                HISTORY & PHYSICAL   CHIEF COMPLAINT:  Right-sided abdominal pain.   HISTORY OF PRESENT ILLNESS:  [name] [name] is a 77 year old African-American  gentleman who presents for further evaluation of recent right-sided  abdominal pain.  His appointment was made by his daughter.  He is somewhat  of a poor historian but what I can gather, he has had some repeated  abdominal pain which apparently was migratory a few days ago.  It was quite  severe, and he had to go to the emergency department.  He states that it  felt like he was having a blockage.  He denies any nausea or vomiting.  He  admits to having issues with constipation.  Denies any melena or rectal  bleeding.  In the emergency department, he had a negative acute abdominal  series.  His LFTs and lipase were normal.  Hemoglobin was 12.5, although  mildly decreased.  His urinalysis revealed trace blood.  He denies any  dysuria.  His appetite is good.  Heartburn is controlled on Protonix.  His  bowels are moving more regularly now that he is taking milk of magnesia on a  regular basis.  He also takes Dulcolax every four days.  He was advised by  Dr. Jerre Simon previously to take milk of magnesia because of urinary retention,  felt to be aggravated by constipation.  He has a history of adenomatous  colon polyp.  His last colonoscopy was in May, 2002.  He was due for a  repeat study in January, 2007.   CURRENT MEDICATIONS:  1. Ambien CR 12.5 mg nightly.  2. Klonopin 0.5 mg t.i.d.  3. Darvocet-N 100 nightly.  4. Milk of magnesia 60 cc daily.  5. Lipitor 20 mg nightly.  6. Ultram 100 mg daily.  7. Norvasc 5 mg daily.  8.  Protonix 40 mg daily.  9. __________ 20-40 mg q.a.m.  10.Loratadine D q.a.m.  11.Simethicone 80 mg 2 every 6 hours as needed.  12.Dulcolax every four days.  13.Xalatan 1 drop each eye daily.  14.Alphagan 1 drop each eye twice daily.  15.Cosopt 1 drop each eye three times a day.  16.UroXatral 2 mg daily.   ALLERGIES:  No known drug allergies.   PAST MEDICAL HISTORY:  1. Hypertension.  2. Glaucoma.  3. GERD.  4. Insomnia.  5. Anxiety.  6. Chronic constipation.  7. Hypercholesterolemia.  8. Benign prostatic hypertrophy, status post TURP.  9. He reports having some sort of bladder surgery about six months ago.  10.He has had an umbilical hernia repair in the past.  11.History of colonoscopy in May, 2002, which was normal but prior history      of adenomatous polyps.  EGD in August, 2005 revealed geographic mucosal  abnormality along the lesser curvature of the stomach.  Biopsies      revealed chronic gastritis with intestinal metaplasia.   FAMILY HISTORY:  Mother and father both died of cancer in their 31s.  Brother died of prostate cancer.  Two sisters died of breast cancer.  He has  a brother and a sister who have died from liver cancer.   SOCIAL HISTORY:  He recently was married in February, 2007 after 49 years of  marriage.  He has eight children.  He is retired from [employer].  He quit smoking around 2002.  No alcohol use.   REVIEW OF SYSTEMS:  See HPI for GI.  CONSTITUTIONAL:  Denies any weight  loss.  CARDIOPULMONARY:  Denies any chest pain or shortness of breath.   PHYSICAL EXAMINATION:  VITAL SIGNS:  Weight 169.  Height 5 feet 6.  Temp  98.3, blood pressure 118/80, pulse 78.  GENERAL:  A pleasant, well-developed and well-nourished black gentleman in  no acute distress.  SKIN:  Warm and dry with no jaundice.  HEENT:  Conjunctivae are pink.  Sclerae are anicteric.  Oropharyngeal mucosa  moist.  No lesions, erythema, or exudate.  NECK:  No lymphadenopathy  or thyromegaly.  CHEST:  Lungs are clear to auscultation.  CARDIAC:  Regular rate and rhythm.  Normal S1 and S2.  No murmur, rub or  gallop.  ABDOMEN:  Positive bowel sounds.  Soft, nontender, nondistended.  No  organomegaly, masses.  No rebound tenderness or guarding.  No abdominal  bruits or hernias.  EXTREMITIES:  No edema.   IMPRESSION:  [name] [name] is a 77 year old gentleman with chronic  constipation who recently presented to the emergency department with right-  sided abdominal pain which he states has actually migrated throughout the  abdomen.  He feels like he was having a blockage.  He eventually had bowel  movement and pain resolved.  He has been painfree for about two days.  He  has a history of adenomatous colon polyp and is due for a surveillance  colonoscopy.   PLAN:  1. Colonoscopy in the near future.  2 . Continue on current regimen for constipation.  1. Will add Flora-Q 1 tablet daily to help regulate bowels and help with      abdominal bloating.      Tana Coast, P.AJonathon Bellows, M.D.  Electronically Signed    LL/MEDQ  D:  05/09/2006  T:  05/09/2006  Job:  [id number]   cc:   Milus Mallick. Lodema Hong, M.D.  Fax: [phone]

## 2011-01-26 NOTE — H&P (Signed)
NAME:  [name] [name], [name] [name]                      ACCOUNT NO.:  [id number]   MEDICAL RECORD NO.:  [id number]                   PATIENT TYPE:  INP   LOCATION:  A310                                 FACILITY:  [facility]   PHYSICIAN:  Tesfaye D. Felecia Shelling, M.D.              DATE OF BIRTH:  01/05/1931   DATE OF ADMISSION:  03/21/2003  DATE OF DISCHARGE:                                HISTORY & PHYSICAL   CHIEF COMPLAINT:  Abdominal pain, constipation, and urinary retention.   HISTORY OF PRESENT ILLNESS:  This is a 77 year old male patient with a  history of hypertension, gastroesophageal reflux disease, and anxiety.  He  came to the emergency room with the above complications.  The patient claims  that he has not had a bowel movement for the last 5 days. He started feeling  abdominal pain since the last 24 hours.  He was unable to urinate. He was  then brought to the emergency room where Foley catheter was inserted.  His  abdominal x-ray also showed a lot of stool in his colon.  The patient was  then admitted and given laxatives and Fleets enema.  The patient still  complains of abdominal discomfort and distention.  No nausea or vomiting.   PAST MEDICAL HISTORY:  1. Anxiety disorder.  2. Hypertension.  3. Gastroesophageal reflux disease.   CURRENT MEDICATIONS:  1. Nexium 40 mg p.o. daily.  2. Flomax 40 mg p.o. daily.  3. Xanax 1 mg p.o. daily.  4. Norvasc 300 mg p.o. daily.  5. Ibuprofen 300 mg p.o. daily.   PERSONAL AND SOCIAL HISTORY:  The patient is married.  He stopped smoking  about 5 years ago.  No history of alcohol or substance abuse.   PHYSICAL EXAMINATION:  GENERAL:  The patient is alert, awake, and  chronically sick looking.  VITAL SIGNS:  Blood pressure 130/80, pulse 88, respiratory rate 20, and  temperature 98 degrees Fahrenheit.  HEENT:  Pupils are equal and reactive.  NECK:  Supple.  CHEST:  Decreased air entry, bilateral rhonchi.  CARDIOVASCULAR:  First and second heart  sounds heard.  No murmurs.  No  gallops.  ABDOMEN:  Full.  Bowel sounds are hyperactive.  No masses.  No organomegaly.  GENITOURINARY: The patient has a Foley catheter and is draining clear urine.  EXTREMITIES:  No leg edema.   ASSESSMENT:  1. Abdominal pain probably secondary to chronic constipation.  2. Gastroesophageal reflux disease.  3. Constipation.  4. Urinary retention, etiology unclear.  5. Hypertension.   PLAN:  Will continue patient on laxatives.  We will try to give him soap  suds enema.  Will do urology consult, and will continue his regular  medications.  Tesfaye D. Felecia Shelling, M.D.    TDF/MEDQ  D:  03/21/2003  T:  03/21/2003  Job:  [id number]

## 2011-01-26 NOTE — Discharge Summary (Signed)
NAME:  [name] [name], [name] [name]            ACCOUNT NO.:  [id number]   MEDICAL RECORD NO.:  [id number]          PATIENT TYPE:  INP   LOCATION:  A314                          FACILITY:  [facility]   PHYSICIAN:  Ky Barban, M.D.DATE OF BIRTH:  1931-01-22   DATE OF ADMISSION:  07/03/2005  DATE OF DISCHARGE:  10/27/2006LH                                 DISCHARGE SUMMARY   CHIEF COMPLAINT:  Difficulty to void.   HISTORY OF PRESENT ILLNESS:  A 77 year old gentleman who for a long time has  been complaining of symptoms of prostatism.  His UA score is 26.  Cystoscopy  shows enlarged prostate with a bladder neck obstruction.  I have treated him  with UroXatral and Flomax at times without much benefit, so I decided to go  ahead and do a TUR of prostate for which he underwent preadmission workup.  WBC is 6.1, hematocrit 35.6.  Sodium was 142, potassium 3.7, chloride 109,  CO2 28, glucose 105, BUN 18, creatinine 1.1.   HOSPITAL COURSE:  He was taken to the operating room on October 25, and  underwent TUR of prostate.  Postop course has been benign except he  complained of significant constipation for a couple of days.  He was given  laxative and had good results with a Fleet enema.  On postop day #1, we took  out his IV.  On postop day #2, after he had bowel movement, we took out the  Foley catheter.  He is voiding fine.  Pathology report came back as BPH.  We  took out the Foley catheter on October 26.  He was voiding and was  discharged home the next day.  We will follow him in the office.   DISCHARGE MEDICATIONS:  He is advised to continue his regular medications.   FOLLOW UP:  We will see him back in the office in 2 weeks.   DISCHARGE DIAGNOSIS:  Benign prostatic hypertrophy.   CONDITION ON DISCHARGE:  Improved.      Ky Barban, M.D.  Electronically Signed     MIJ/MEDQ  D:  08/14/2005  T:  08/14/2005  Job:  [id number]   cc:   Milus Mallick. Lodema Hong, M.D.  Fax: [phone]

## 2011-01-26 NOTE — Op Note (Signed)
NAME:  [name] [name], [name] [name]            ACCOUNT NO.:  [id number]   MEDICAL RECORD NO.:  [id number]          PATIENT TYPE:  AMB   LOCATION:  DAY                           FACILITY:  [facility]   PHYSICIAN:  R. Roetta Sessions, M.D. DATE OF BIRTH:  1931/02/01   DATE OF PROCEDURE:  05/30/2006  DATE OF DISCHARGE:                                 OPERATIVE REPORT   PROCEDURE:  Surveillance colonoscopy.   INDICATIONS FOR PROCEDURE:  The patient is a 77 year old African-American  male with a distant history of colonic adenomatous polyps.  He has had  migratory abdominal pain recently.  Colonoscopy is now being primarily for  surveillance.  This approach has been discussed with the patient at length.  Potential risks, benefits, and alternatives have been reviewed, questions  answered.  Please see documentation in the medical record.   PROCEDURE NOTE:  O2 saturations, blood pressure, pulse, and respirations  were monitored throughout the entire procedure.   CONSCIOUS SEDATION:  1. Versed 3 mg IV.  2. Demerol 75 mg IV in divided dose.   INSTRUMENT:  The Olympus video chip system.   FINDINGS:  Digital rectal exam revealed no abnormalities.   ENDOSCOPIC FINDINGS:  Prep was adequate.   RECTUM AND COLON:  Examination of rectal mucosa and retroflexed view of the  anal verge revealed no abnormalities.   COLON:  Colonic mucosa was surveyed from the rectosigmoid junction to the  left transverse right colon to the area of the appendiceal orifice,  ileocecal valve, and cecum.  These structures were well-seen, photographed  for the record.  From this level, the scope was slowly withdrawn.  All  previously mentioned mucosal surfaces were again seen.  The patient was  noted to have pea-scattered pancolonic diverticulum and colonic mucosa  appeared entirely normal.  The patient tolerated the procedure well, was  reacted in endoscopy.   IMPRESSION:  1. Normal rectum.  2. Few scattered pancolonic  diverticula.  3. Remaining colonic mucosa appeared normal.  No explanation for the      patient's abdominal pain based on this exam.   RECOMMENDATIONS:  Proceed with abdominopelvic CT with IV normal contrast.  Further recommendations to follow.      Jonathon Bellows, M.D.  Electronically Signed     RMR/MEDQ  D:  05/30/2006  T:  05/31/2006  Job:  [id number]   cc:   Milus Mallick. Lodema Hong, M.D.  Fax: [phone]

## 2011-01-26 NOTE — H&P (Signed)
NAME:  [name] [name], [name] [name]                      ACCOUNT NO.:  [id number]   MEDICAL RECORD NO.:  [id number]                   PATIENT TYPE:  EMS   LOCATION:  ED                                   FACILITY:  [facility]   PHYSICIAN:  R. Roetta Sessions, M.D.              DATE OF BIRTH:  20-Jul-1931   DATE OF ADMISSION:  04/05/2004  DATE OF DISCHARGE:  03/15/2004                                HISTORY & PHYSICAL   HISTORY OF PRESENT ILLNESS:  The patient is a 77 year old black gentleman  who presents today for further evaluation of dysphasia to pills and meats.  He has a history of esophageal dysphasia requiring dilatation in October  2001.  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  An EGD revealed an  endoscopically normal appearing tubular esophagus.  He underwent dilatation  with a 58 [place] Maloney dilator.  The patient tells me that the esophageal  dilatation helped with his swallowing.  Two months ago, however, he had  recurrent symptoms.  The symptoms have been gradually worsening.  He notes  that the pills become stuck in his upper esophagus as well as some solid  foods.  He denies any food impactions.  He has had some epigastric  discomfort as well as some mild reflux symptoms.  He has been on Nexium and  Prevacid in the past but it appears that he is no longer on any medication.  His bowels are moving regularly.  He denies any melena or rectal bleeding.   CURRENT MEDICATIONS:  1. Xanax 1 mg t.i.d.  2. Flomax 0.4 mg daily.  3. Mirtazapine 15 mg daily.  4. Lactulose 1 Tbs. p.r.n.  5. __________ 1 drop each eye daily.  6. Xalatan 1 drop each eye daily.  7. Carafate 1 g q.i.d.  8. Norvasc 5 mg daily.  9. Avapro 300 mg daily.   ALLERGIES:  No known drug allergies.   PAST MEDICAL HISTORY:  1. Hypertension.  2. Glaucoma.  3. Gastroesophageal reflux disease.  4. Benign prostatic hypertrophy status post  questionable TURP.  5. Anxiety neurosis.  6. Umbilical hernia repair.  7. Colonoscopy in May 2002 with a history of adenomatous colonic polyp.     Revealed a normal study.  He was recommended to have a followup     colonoscopy in January 2007.   FAMILY HISTORY:  Mother and father both died of cancer in their 67s.  Brother died of prostate cancer.  Two sisters died of breast cancer.  He has  a brother and a sister who both died of liver cancer.   SOCIAL HISTORY:  He has been married for 47 years and has 8 children.  He is  retired.  He quit smoking 3 years ago.  No alcohol use.   REVIEW OF SYSTEMS:  GASTROINTESTINAL:  Please see HPI.  GENERAL:  He  complains  of a 5 pound weight loss.  CARDIOPULMONARY:  He denies any chest  pain or shortness of breath.   PHYSICAL EXAMINATION:  VITAL SIGNS:  Weight 160, down from 175 two years  ago.  Blood pressure 110/70, pulse 60.  GENERAL:  A pleasant well-developed well-nourished black gentleman in no  acute distress.  He is accompanied by his daughter.  SKIN:  Warm and dry.  No jaundice.  HEENT:  Pupils equal, round, and reactive to light.  The conjunctivae are  pink.  Sclerae are nonicteric.  Oropharyngeal moist and pink.  No lesions,  erythema, or exudates.  LUNGS:  Clear to auscultation.  CARDIAC:  Reveals a regular rate and rhythm.  Normal S1 S2.  No murmurs,  rubs, or gallops.  ABDOMEN:  Positive bowel sounds.  Soft, nontender, and nondistended.  No  organomegaly or masses.  EXTREMITIES:  No edema.   IMPRESSION:  The patient is a 77 year old gentleman who has a long history  of chronic gastroesophageal reflux disease, who presents with a couple month  history of progressive dysphasia, primarily to solid foods and pills.  He  has had an esophageal dilatation in the past, as outlined above.  This  seemed to help his dysphasia.  He may have esophageal narrowing or  stricture, although we could be dealing with esophageal motility disorder.   The patient complains of mild epigastric pain, which may be related to  gastroesophageal reflux disease.  We will further evaluate this at the time  of endoscopy.   PLAN:  1. EGD with dilatation in the near future.  2. We will reevaluate at the time of EGD and make recommendations regarding     PPI therapy.     _____________________________________  ___________________________________________  Tana Coast, P.AJonathon Bellows, M.D.   LL/MEDQ  D:  04/05/2004  T:  04/05/2004  Job:  [id number]   cc:   Tesfaye D. Felecia Shelling, M.D.  [address]  [place]  [place] [address]  Fax: [phone]

## 2011-01-26 NOTE — Procedures (Signed)
[facility]  Patient:    [name] [name], [name] [name] Visit Number: [id number] MRN: [id number]          Service Type: EMS Location: ED Attending Physician:  Annamarie Dawley Dictated by:   Kari Baars, M.D. Proc. Date: 03/06/02 Admit Date:  03/06/2002 Discharge Date: 03/06/2002                            EKG Interpretations  The rhythm is a sinus bradycardia with a rate of about 50.  There are ST-T wave changes laterally which may be due to ischemia and clinical correlation is suggested. Dictated by:   Kari Baars, M.D. Attending Physician:  Annamarie Dawley DD:  03/07/02 TD:  03/09/02 Job: [id number]

## 2011-01-26 NOTE — H&P (Signed)
NAME:  [name] [name], [name] [name] NO.:  [id number]   MEDICAL RECORD NO.:  [id number]                  PATIENT TYPE:   LOCATION:                                       FACILITY:   PHYSICIAN:  Gerrit Friends. Rourk, M.D.               DATE OF BIRTH:  December 04, 1930   DATE OF ADMISSION:  DATE OF DISCHARGE:                                HISTORY & PHYSICAL   CHIEF COMPLAINT:  Problems swallowing.   HISTORY OF PRESENT ILLNESS:  The patient is a 77 year old black gentleman  who presents today at the request of Dr. Felecia Shelling for further evaluation of  dysphagia.  For the past several weeks he has noticed increased difficulty  swallowing solid foods and pills.  On one occasion several weeks ago he had  food become lodged in his esophagus.  This eventually went down on its own.  Dr. Felecia Shelling arranged for a barium esophagram which showed a thin mucosal  ring/web in the proximal cervical esophagus, significantly narrowing the AP  diameter of the esophagus, extending anterior/posterior direction  approximately 60% of the luminal diameter.  A 12.5 mm barium pill easily  passed, however.  The patient has a known history of chronic GERD.  He takes  Nexium 40 mg daily.  Denies any heartburn symptoms, nausea, vomiting,  abdominal pain, constipation, melena, rectal bleeding, or diarrhea.  He has  been maintained on a soft food/liquid diet.   CURRENT MEDICATIONS:  1. Xanax 1 mg t.i.d.  2. Terazosin 5 mg q.h.s.  3. Hydrocodone 1 b.i.d. p.r.n.  4. Avapro 150 mg q.d.  5. Nexium 40 mg q.d.  6. Norvasc 10 mg q.d.  7. Xalatan drop bilateral q.d.  8. Cosopt 1 drop bilateral q.d.  9. Colace 1-2 each week.   ALLERGIES:  No known drug allergies.   PAST MEDICAL HISTORY:  1. Hypertension.  2. Glaucoma.  3. Gastroesophageal reflux disease.  4. Benign prostatic hypertrophy, status post questionable TURP.  5. Anxiety neurosis.  6. Umbilical hernia repair.  7. Recent kidney infection.   FAMILY  HISTORY:  Mother and father both died of cancer in their 20s.  Brother died of prostate cancer.  Two sisters died of breast cancer.  A  brother and a sister both died of liver cancer.   SOCIAL HISTORY:  Married for 47 years.  Has eight children.  Retired.  Quit  smoking three years ago.  No alcohol use.   REVIEW OF SYSTEMS:  Please see HPI for GI.  GENERAL:  Denies any weight  loss.  CARDIOPULMONARY:  Denies any chest pain, shortness of breath.   PHYSICAL EXAMINATION:  VITAL SIGNS:  Weight 175.25 pounds.  Blood pressure  122/80, pulse 76.  GENERAL:  Very pleasant, well-nourished, well-developed black male in no  acute distress.  He is accompanied by his daughter.  SKIN:  Warm and dry.  No jaundice.  HEENT:  Conjunctivae pink, sclerae nonicteric.  Oropharyngeal mucosa moist  and pink.  No lesions, erythema, or exudate.  CHEST:  Clear to auscultation.  CARDIAC:  Regular rate and rhythm.  Normal S1, S2.  No murmurs, rubs, or  gallops.  ABDOMEN:  Positive bowel sounds.  Soft, nontender, nondistended.  No  organomegaly or masses.  EXTREMITIES:  No edema.   IMPRESSION:  The patient is a pleasant 77 year old gentleman who has a long  history of chronic gastroesophageal reflux disease who, over the last  several weeks, has developed dysphagia both to solid foods and pills.  He  describes an episode of esophageal food impaction which resolved on its own.  Recent barium esophagram revealed a mucosal ring/web in the proximal  cervical esophagus with significant narrowing.  The patient needs to have an  EGD with dilatation in the near future.   PLAN:  1. EGD with dilatation.  2. Continue Nexium 40 mg daily for now.  3. The patient is due for a surveillance colonoscopy given his personal     history of adenomatous polyps in 2007.   I would like to thank Dr. Felecia Shelling for allowing [place] to take part in the care of  this patient.     Tana Coast, P.A.                        Gerrit Friends. Rourk,  M.D.    LL/MEDQ  D:  06/03/2002  T:  06/03/2002  Job:  [id number]   cc:   Tesfaye D. Felecia Shelling, M.D.

## 2011-01-26 NOTE — Op Note (Signed)
NAME:  [name] [name], [name] [name]            ACCOUNT NO.:  [id number]   MEDICAL RECORD NO.:  [id number]          PATIENT TYPE:  INP   LOCATION:  A314                          FACILITY:  [facility]   PHYSICIAN:  Ky Barban, M.D.DATE OF BIRTH:  [birth year]   DATE OF PROCEDURE:  07/03/2005  DATE OF DISCHARGE:                                 OPERATIVE REPORT   PREOPERATIVE DIAGNOSIS:  Benign prostatic hypertrophy.   POSTOPERATIVE DIAGNOSIS:  Benign prostatic hypertrophy.   PROCEDURE:  Transurethral resection of the prostate.   ANESTHESIA:  Spinal.   PROCEDURE NOTE:  The patient was given spinal anesthesia, placed in  lithotomy position after usual prep and drape. A #28 Iglesias resectoscope  was introduced into the bladder. It was inspected. Resectoscope was pulled  back in the mid prostatic urethra. Bladder neck was circumferentially  resected down to the circular fibers. Resectoscope was pulled back to the  level of the verumontanum, rotated to the 11 o'clock position. Right lobe  was resected between 11 and 7 o'clock position. Similarly, the left lobe was  resected between 1 and 5 o'clock position. There was a small amount of  apical and posterior midline tissue which was resected very carefully, not  to injure the sphincter or the verumontanum. Last, the anterior midline  tissue was resected. Prostatic urethra looks open, and the chips were  evacuated. Bleeders were coagulated. Resectoscope was removed, and a  ____________ Foley left in for drainage. The patient left the operating room  in satisfactory condition.      Ky Barban, M.D.  Electronically Signed     MIJ/MEDQ  D:  07/03/2005  T:  07/03/2005  Job:  [id number]   cc:   Lodema Hong, M.D.

## 2011-01-26 NOTE — H&P (Signed)
NAME:  [name] [name], [name] [name]            ACCOUNT NO.:  [id number]   MEDICAL RECORD NO.:  [id number]          PATIENT TYPE:  AMB   LOCATION:  DAY                           FACILITY:  [facility]   PHYSICIAN:  Ky Barban, M.D.DATE OF BIRTH:  1930-12-03   DATE OF ADMISSION:  07/03/2005  DATE OF DISCHARGE:  LH                                HISTORY & PHYSICAL   CHIEF COMPLAINT:  Difficulty voiding.   A 77 year old male.  I am following him for the last couple of years, and  two years ago he had urinary retention.  At that time cystoscopy was done.  He was found to have moderate bladder neck obstruction with enlarged  prostate.  Voiding trial was given.  He was able to void satisfactorily, and  he has been treated conservatively with UroXatral.  Sometimes he has good  results.  Other times he complains a lot of symptoms that he has to really  have significant symptoms of prostatism.  His AUA score is 26.  Sometime he  is voiding fine, so I worked him up again.  He does have moderate  enlargement of the prostate and I have advised him that we can due TUR  prostate, no guarantees about the results.  His main complaint is he has  discomfort in the suprapubic area and slow stream.  He is not responding to  UroXatral or Flomax.  He also in the last couple of years has several  documented urinary tract infections.  He will be coming as outpatient to  under TUR prostate, will be admitted in the hospital.   PAST MEDICAL HISTORY:  Denies any other significant medical problems.   ALLERGIES:  None.   REVIEW OF SYSTEMS:  Denies any chest pain, orthopnea, PND, nausea, vomiting.   PHYSICAL EXAMINATION:  GENERAL:  A well-nourished, well-developed male.  VITAL SIGNS:  Blood pressure 120/80, temperature is normal.  CENTRAL NERVOUS SYSTEM:  Negative.  HEENT:  Head, neck, eye, ENT negative.  CHEST:  Symmetrical, normal breath sounds.  CARDIAC:  Regular sinus rhythm.  ABDOMEN:  Soft, flat.  Liver, spleen  and kidneys were not felt.  There was  no CVA tenderness.  GENITOURINARY:  External genitalia is uncircumcised, normal testicles.  RECTAL:  Normal sphincter tone, no rectal mass.  Prostate 1-1/2+, smooth and  firm.   IMPRESSION:  Benign prostatic hypertrophy.   PLAN:  TUR prostate under anesthesia, then admit him.      Ky Barban, M.D.  Electronically Signed     MIJ/MEDQ  D:  06/28/2005  T:  06/28/2005  Job:  [id number]   cc:   [facility]  Fax: [phone]   Dorthula Rue. Early Chars, MD  Fax: [phone]

## 2011-02-12 ENCOUNTER — Encounter: Payer: Self-pay | Admitting: Family Medicine

## 2011-02-13 ENCOUNTER — Encounter: Payer: Self-pay | Admitting: Family Medicine

## 2011-02-13 ENCOUNTER — Ambulatory Visit: Payer: Self-pay | Admitting: Family Medicine

## 2011-02-15 DIAGNOSIS — R52 Pain, unspecified: Secondary | ICD-10-CM

## 2011-02-15 DIAGNOSIS — N3942 Incontinence without sensory awareness: Secondary | ICD-10-CM

## 2011-02-15 DIAGNOSIS — R5383 Other fatigue: Secondary | ICD-10-CM

## 2011-02-15 DIAGNOSIS — G8929 Other chronic pain: Secondary | ICD-10-CM

## 2011-02-15 DIAGNOSIS — R5381 Other malaise: Secondary | ICD-10-CM

## 2011-02-22 ENCOUNTER — Telehealth: Payer: Self-pay | Admitting: Family Medicine

## 2011-02-22 NOTE — Telephone Encounter (Signed)
If fever or chills and green sputum will need to go to urgent care or ed, if stomach pain is not being relieved and is severe, needs to go to urgent care or ED

## 2011-02-23 ENCOUNTER — Other Ambulatory Visit: Payer: Self-pay | Admitting: *Deleted

## 2011-02-23 MED ORDER — CLONAZEPAM 1 MG PO TABS: 1.0000 mg | ORAL_TABLET | Freq: Two times a day (BID) | ORAL | Status: DC | PRN

## 2011-02-23 NOTE — Telephone Encounter (Signed)
Caretaker aware

## 2011-04-05 ENCOUNTER — Other Ambulatory Visit: Payer: Self-pay | Admitting: Family Medicine

## 2011-04-20 ENCOUNTER — Other Ambulatory Visit: Payer: Self-pay | Admitting: Family Medicine

## 2011-04-20 ENCOUNTER — Encounter: Payer: Self-pay | Admitting: Family Medicine

## 2011-04-23 ENCOUNTER — Other Ambulatory Visit: Payer: Self-pay | Admitting: Family Medicine

## 2011-04-23 ENCOUNTER — Encounter: Payer: Self-pay | Admitting: Family Medicine

## 2011-04-24 ENCOUNTER — Encounter: Payer: Self-pay | Admitting: Family Medicine

## 2011-04-24 ENCOUNTER — Ambulatory Visit (INDEPENDENT_AMBULATORY_CARE_PROVIDER_SITE_OTHER): Payer: PRIVATE HEALTH INSURANCE | Admitting: Family Medicine

## 2011-04-24 VITALS — BP 110/80 | Ht 66.5 in | Wt 169.1 lb

## 2011-04-24 DIAGNOSIS — F0392 Unspecified dementia, unspecified severity, with psychotic disturbance: Secondary | ICD-10-CM

## 2011-04-24 DIAGNOSIS — R109 Unspecified abdominal pain: Secondary | ICD-10-CM

## 2011-04-24 DIAGNOSIS — I10 Essential (primary) hypertension: Secondary | ICD-10-CM

## 2011-04-24 DIAGNOSIS — R5383 Other fatigue: Secondary | ICD-10-CM

## 2011-04-24 DIAGNOSIS — J309 Allergic rhinitis, unspecified: Secondary | ICD-10-CM

## 2011-04-24 DIAGNOSIS — E785 Hyperlipidemia, unspecified: Secondary | ICD-10-CM

## 2011-04-24 DIAGNOSIS — R5381 Other malaise: Secondary | ICD-10-CM

## 2011-04-24 DIAGNOSIS — F0390 Unspecified dementia without behavioral disturbance: Secondary | ICD-10-CM

## 2011-04-24 DIAGNOSIS — Z13820 Encounter for screening for osteoporosis: Secondary | ICD-10-CM

## 2011-04-24 NOTE — Patient Instructions (Addendum)
F/u in 4 months, mid December.  Fasting lipid, hepatic, chem 7 , vit D and TSH  No change  In medication at this time.  Eat regularly, and ensure you get enough rest   LABWORK  NEEDS TO BE DONE BETWEEN 3 TO 7 DAYS BEFORE YOUR NEXT SCEDULED  VISIT.  THIS WILL IMPROVE THE QUALITY OF YOUR CARE.

## 2011-05-01 NOTE — Assessment & Plan Note (Signed)
Controlled, no change in medication  

## 2011-05-01 NOTE — Assessment & Plan Note (Signed)
Low fat diet encouraged, will rept labs in 3 to 4 months

## 2011-05-01 NOTE — Progress Notes (Signed)
  Subjective:    Patient ID: COWAN PILAR, male    DOB: 08-25-1931, 77 y.o.   MRN: [id number]  HPI The PT is here for follow up and re-evaluation of chronic medical conditions, medication management and review of any available recent lab and radiology data.  Preventive health is updated, specifically  Cancer screening and Immunization.   Questions or concerns regarding consultations or procedures which the PT has had in the interim are  addressed. The PT denies any adverse reactions to current medications since the last visit.  There are no new concerns.  Chronic c/o abdominal pain with liquid running up into the chest, inability to void and excessive gas..all delusional    Review of Systems See HPI Denies recent fever or chills. Denies sinus pressure, nasal congestion, ear pain or sore throat. Denies chest congestion, productive cough or wheezing. Denies chest pains, palpitations and leg swelling  Denies dysuria, frequency, hesitancy or incontinence. Chronic  joint pain,  and limitation in mobility. Denies headaches, seizures, numbness, or tingling. Denies uncontrolled  depression, anxiety or insomnia. Denies skin break down or rash.       Objective:   Physical Exam Patient alert and  in no cardiopulmonary distress.  HEENT: No facial asymmetry, EOMI, no sinus tenderness,  oropharynx pink and moist.  Neck decreased ROM no adenopathy.  Chest: Clear to auscultation bilaterally.Decreased air entry throughout  CVS: S1, S2 no murmurs, no S3.  ABD: Soft non tender. Bowel sounds normal.  Ext: No edema  NF:[id number] ROM spine, shoulders, hips and knees.  Skin: Intact, no ulcerations or rash noted.  Psych: Good eye contact,flat affect. Memory loss severe, pt confused chronic, unreliable historian  CNS: CN 2-12 intact, power, tone and sensation normal throughout.        Assessment & Plan:

## 2011-05-01 NOTE — Assessment & Plan Note (Signed)
Unchanged, primarily linked to delusional thinking

## 2011-05-01 NOTE — Assessment & Plan Note (Signed)
Unchanged, no med changes

## 2011-06-04 LAB — DIFFERENTIAL
Basophils Relative: 0
Lymphocytes Relative: 31
Lymphs Abs: 2.1
Monocytes Absolute: 0.5
Monocytes Relative: 7
Neutro Abs: 3.9
Neutrophils Relative %: 58

## 2011-06-04 LAB — URINE MICROSCOPIC-ADD ON

## 2011-06-04 LAB — CBC
Hemoglobin: 11.8 — ABNORMAL LOW
MCHC: 32.7
RBC: 4.29
WBC: 6.7

## 2011-06-04 LAB — URINE CULTURE: Colony Count: >=100000

## 2011-06-04 LAB — BASIC METABOLIC PANEL
Calcium: 9.4
Creatinine, Ser: 1.16
GFR calc Af Amer: >60
GFR calc non Af Amer: >60
Sodium: 138

## 2011-06-04 LAB — URINALYSIS, ROUTINE W REFLEX MICROSCOPIC
Glucose, UA: NEGATIVE
Ketones, ur: NEGATIVE
Leukocytes, UA: NEGATIVE
Nitrite: NEGATIVE
Protein, ur: NEGATIVE

## 2011-06-07 LAB — COMPREHENSIVE METABOLIC PANEL
ALT: 13
Alkaline Phosphatase: 72
BUN: 21
CO2: 28
Calcium: 9.4
GFR calc non Af Amer: 51 — ABNORMAL LOW
Glucose, Bld: 107 — ABNORMAL HIGH
Potassium: 3.9
Sodium: 137

## 2011-06-07 LAB — CBC
HCT: 39
Hemoglobin: 12.5 — ABNORMAL LOW
MCHC: 32
RBC: 4.55

## 2011-06-07 LAB — URINALYSIS, ROUTINE W REFLEX MICROSCOPIC
Ketones, ur: NEGATIVE
Leukocytes, UA: NEGATIVE
Nitrite: NEGATIVE
Protein, ur: NEGATIVE
Urobilinogen, UA: 0.2

## 2011-06-07 LAB — DIFFERENTIAL
Basophils Relative: 0
Eosinophils Absolute: 0.2
Neutro Abs: 3.6
Neutrophils Relative %: 61

## 2011-06-07 LAB — LIPASE, BLOOD: Lipase: 29

## 2011-06-08 DIAGNOSIS — R5383 Other fatigue: Secondary | ICD-10-CM

## 2011-06-08 DIAGNOSIS — R52 Pain, unspecified: Secondary | ICD-10-CM

## 2011-06-08 DIAGNOSIS — R5381 Other malaise: Secondary | ICD-10-CM

## 2011-06-08 DIAGNOSIS — G8929 Other chronic pain: Secondary | ICD-10-CM

## 2011-06-08 DIAGNOSIS — N3942 Incontinence without sensory awareness: Secondary | ICD-10-CM

## 2011-07-09 DIAGNOSIS — R5383 Other fatigue: Secondary | ICD-10-CM

## 2011-07-09 DIAGNOSIS — G8929 Other chronic pain: Secondary | ICD-10-CM

## 2011-07-09 DIAGNOSIS — R5381 Other malaise: Secondary | ICD-10-CM

## 2011-07-09 DIAGNOSIS — R52 Pain, unspecified: Secondary | ICD-10-CM

## 2011-07-11 ENCOUNTER — Encounter: Payer: Self-pay | Admitting: Family Medicine

## 2011-07-16 ENCOUNTER — Ambulatory Visit: Payer: PRIVATE HEALTH INSURANCE | Admitting: Family Medicine

## 2011-07-17 ENCOUNTER — Encounter: Payer: Self-pay | Admitting: Family Medicine

## 2011-07-17 ENCOUNTER — Ambulatory Visit (INDEPENDENT_AMBULATORY_CARE_PROVIDER_SITE_OTHER): Payer: PRIVATE HEALTH INSURANCE | Admitting: Family Medicine

## 2011-07-17 VITALS — BP 118/80 | HR 90 | Resp 16 | Ht 66.0 in | Wt 171.4 lb

## 2011-07-17 DIAGNOSIS — E785 Hyperlipidemia, unspecified: Secondary | ICD-10-CM

## 2011-07-17 DIAGNOSIS — I10 Essential (primary) hypertension: Secondary | ICD-10-CM

## 2011-07-17 DIAGNOSIS — G309 Alzheimer's disease, unspecified: Secondary | ICD-10-CM

## 2011-07-17 DIAGNOSIS — Z23 Encounter for immunization: Secondary | ICD-10-CM

## 2011-07-17 DIAGNOSIS — J309 Allergic rhinitis, unspecified: Secondary | ICD-10-CM

## 2011-07-17 DIAGNOSIS — F0390 Unspecified dementia without behavioral disturbance: Secondary | ICD-10-CM

## 2011-07-17 NOTE — Patient Instructions (Addendum)
F/u in 4.5 month  No med changes at this time   Fasting lipid, hepatic and chem 7 as soon as possible.   Flu vaccine today in the office also a pneumonia vaccine

## 2011-07-17 NOTE — Progress Notes (Signed)
  Subjective:    Patient ID: [name] [name], male    DOB: 1930-11-09, 77 y.o.   MRN: [id number]  HPI Pt in for f/u chronic problems. He continues to c/o swelling of his abdomen, with food not getting out and urine running up into his chest. Staff confirms there are no concerns with either bowel movements or urine. He has had this fixed false complaint for years associated with dementia. His appetite is good, weight is unchanged , he has had no falls   Review of Systems See HPI Denies recent fever or chills. Denies sinus pressure, nasal congestion, ear pain or sore throat.chronic watery nasal drainage Denies chest congestion, productive cough or wheezing. Denies chest pains, palpitations and leg swelling Denies  nausea, vomiting,diarrhea or constipation.   Denies dysuria, frequency, hesitancy , takes medication for incontinence. Chronic joint pain, with mild limitation in mobility. Denies headaches, seizures, numbness, or tingling. Mild depression and  anxiety , denies  insomnia. Denies skin break down or rash.        Objective:   Physical Exam Patient alert and in no cardiopulmonary distress.  HEENT: No facial asymmetry, EOMI, no sinus tenderness,  oropharynx pink and moist.  Neck decreased ROM, no adenopathy.  Chest: Clear to auscultation bilaterally.  CVS: S1, S2 no murmurs, no S3.  ABD: Soft non tender. Bowel sounds normal.  Ext: No edema  MS: decreased OM spine, shoulders, hips and knees.  Skin: Intact, no ulcerations or rash noted.  Psych: Good eye contact, normal affect. Memory loss, not anxious or depressed appearing.  CNS: CN 2-12 intact, power, tone and sensation normal throughout.        Assessment & Plan:

## 2011-07-19 ENCOUNTER — Telehealth: Payer: Self-pay | Admitting: Family Medicine

## 2011-07-20 MED ORDER — CLONAZEPAM 1 MG PO TABS: ORAL_TABLET | ORAL | Status: DC

## 2011-07-20 NOTE — Telephone Encounter (Signed)
Called it in to [facility]

## 2011-07-23 LAB — BASIC METABOLIC PANEL
BUN: 20 mg/dL (ref 6–23)
CO2: 21 mEq/L (ref 19–32)
Chloride: 108 mEq/L (ref 96–112)
Glucose, Bld: 77 mg/dL (ref 70–99)
Potassium: 4.2 mEq/L (ref 3.5–5.3)

## 2011-07-23 LAB — LIPID PANEL
HDL: 38 mg/dL — ABNORMAL LOW (ref >39)
LDL Cholesterol: 104 mg/dL — ABNORMAL HIGH (ref 0–99)
Total CHOL/HDL Ratio: 4.5 Ratio
VLDL: 30 mg/dL (ref 0–40)

## 2011-07-23 LAB — HEPATIC FUNCTION PANEL
AST: 14 U/L (ref 0–37)
Albumin: 4 g/dL (ref 3.5–5.2)
Total Bilirubin: 0.5 mg/dL (ref 0.3–1.2)

## 2011-07-23 NOTE — Assessment & Plan Note (Signed)
Controlled on current meds, no change ?

## 2011-07-23 NOTE — Assessment & Plan Note (Signed)
Controlled, no change in medication  

## 2011-07-23 NOTE — Assessment & Plan Note (Signed)
Low fat diet discussed, no med change, rept labs due

## 2011-07-23 NOTE — Assessment & Plan Note (Signed)
Unchanged, pt to continue current meds

## 2011-08-27 ENCOUNTER — Encounter: Payer: Self-pay | Admitting: Family Medicine

## 2011-08-28 DIAGNOSIS — R5381 Other malaise: Secondary | ICD-10-CM

## 2011-08-28 DIAGNOSIS — R5383 Other fatigue: Secondary | ICD-10-CM

## 2011-08-28 DIAGNOSIS — G8929 Other chronic pain: Secondary | ICD-10-CM

## 2011-08-28 DIAGNOSIS — N3942 Incontinence without sensory awareness: Secondary | ICD-10-CM

## 2011-08-28 DIAGNOSIS — R52 Pain, unspecified: Secondary | ICD-10-CM

## 2011-08-29 ENCOUNTER — Ambulatory Visit: Payer: PRIVATE HEALTH INSURANCE | Admitting: Family Medicine

## 2011-09-14 ENCOUNTER — Other Ambulatory Visit: Payer: Self-pay

## 2011-09-14 MED ORDER — CLONAZEPAM 1 MG PO TABS: ORAL_TABLET | ORAL | Status: DC

## 2011-10-17 DIAGNOSIS — R5383 Other fatigue: Secondary | ICD-10-CM

## 2011-10-17 DIAGNOSIS — R52 Pain, unspecified: Secondary | ICD-10-CM

## 2011-10-17 DIAGNOSIS — R5381 Other malaise: Secondary | ICD-10-CM

## 2011-10-17 DIAGNOSIS — G8929 Other chronic pain: Secondary | ICD-10-CM

## 2011-10-17 DIAGNOSIS — N3942 Incontinence without sensory awareness: Secondary | ICD-10-CM

## 2011-11-19 ENCOUNTER — Ambulatory Visit: Payer: PRIVATE HEALTH INSURANCE | Admitting: Family Medicine

## 2011-11-23 ENCOUNTER — Other Ambulatory Visit: Payer: Self-pay

## 2011-11-23 MED ORDER — CLONAZEPAM 1 MG PO TABS: ORAL_TABLET | ORAL | Status: DC

## 2011-12-12 ENCOUNTER — Ambulatory Visit (INDEPENDENT_AMBULATORY_CARE_PROVIDER_SITE_OTHER): Payer: PRIVATE HEALTH INSURANCE | Admitting: Family Medicine

## 2011-12-12 ENCOUNTER — Encounter: Payer: Self-pay | Admitting: Family Medicine

## 2011-12-12 VITALS — BP 120/82 | HR 80 | Resp 15 | Ht 66.0 in | Wt 175.0 lb

## 2011-12-12 DIAGNOSIS — R5383 Other fatigue: Secondary | ICD-10-CM

## 2011-12-12 DIAGNOSIS — R109 Unspecified abdominal pain: Secondary | ICD-10-CM

## 2011-12-12 DIAGNOSIS — F0392 Unspecified dementia, unspecified severity, with psychotic disturbance: Secondary | ICD-10-CM

## 2011-12-12 DIAGNOSIS — F0390 Unspecified dementia without behavioral disturbance: Secondary | ICD-10-CM

## 2011-12-12 DIAGNOSIS — E785 Hyperlipidemia, unspecified: Secondary | ICD-10-CM

## 2011-12-12 DIAGNOSIS — M899 Disorder of bone, unspecified: Secondary | ICD-10-CM

## 2011-12-12 DIAGNOSIS — M949 Disorder of cartilage, unspecified: Secondary | ICD-10-CM

## 2011-12-12 DIAGNOSIS — Z125 Encounter for screening for malignant neoplasm of prostate: Secondary | ICD-10-CM

## 2011-12-12 DIAGNOSIS — F22 Delusional disorders: Secondary | ICD-10-CM

## 2011-12-12 DIAGNOSIS — I10 Essential (primary) hypertension: Secondary | ICD-10-CM

## 2011-12-12 DIAGNOSIS — R5381 Other malaise: Secondary | ICD-10-CM

## 2011-12-12 NOTE — Patient Instructions (Addendum)
F/u end September.  Pls call if need to be seen sooner.  Fasting lipid and cmp, vit D , cbc and psa second week in June.  No changes in medication. Blood pressure excellent, weight stable, has gained 4 pounds.

## 2011-12-12 NOTE — Progress Notes (Signed)
  Subjective:    Patient ID: [name] [name], male    DOB: 04-Apr-1931, 78 y.o.   MRN: [id number]  HPI The PT is here for follow up and re-evaluation of chronic medical conditions, medication management and review of any available recent lab and radiology data.  Preventive health is updated, specifically  Cancer screening and Immunization.   Questions or concerns regarding consultations or procedures which the PT has had in the interim are  addressed. The PT denies any adverse reactions to current medications since the last visit.  chronic c/o abdominal pain, poor urine stream, and urine "running up into my chest"     Review of Systems See HPI Denies recent fever or chills. Denies sinus pressure, nasal congestion, ear pain or sore throat. Denies chest congestion, productive cough or wheezing. Denies chest pains, palpitations and leg swelling Denies  nausea, vomiting,diarrhea or constipation.   Denies dysuria, frequency, hesitancy or incontinence. Chronic joint pain  and limitation in mobility. Denies headaches, seizures, numbness, or tingling. Denies uncontrolled  depression, anxiety or insomnia. Denies skin break down or rash.        Objective:   Physical Exam Patient alert anddis oriented and in no cardiopulmonary distress.  HEENT: No facial asymmetry, EOMI, no sinus tenderness,  oropharynx pink and moist.  Neck decreased ROM, no adenopathy.  Chest: Clear to auscultation bilaterally.  CVS: S1, S2 no murmurs, no S3.  ABD: Soft non tender. Bowel sounds normal.  Ext: No edema  MS: Adequate though reduced  ROM spine, shoulders, hips and knees.  Skin: Intact, no ulcerations or rash noted.  Psych: Good eye contact, normal affect. Memory loss not anxious or depressed appearing.  CNS: CN 2-12 intact, power, tone and sensation normal throughout.       Assessment & Plan:

## 2011-12-18 NOTE — Assessment & Plan Note (Signed)
Deteriorating, continue medication as before, on maximal doses

## 2011-12-18 NOTE — Assessment & Plan Note (Signed)
Controlled, no change in medication  

## 2011-12-18 NOTE — Assessment & Plan Note (Signed)
Hyperlipidemia:Low fat diet discussed and encouraged.  Updated labs due

## 2011-12-18 NOTE — Assessment & Plan Note (Signed)
Unchanged, and a main part of this is delusional, multiple scans in the past are negative

## 2012-02-06 ENCOUNTER — Telehealth: Payer: Self-pay

## 2012-02-06 NOTE — Telephone Encounter (Signed)
Went to see [name] [name] for his recert and she said he is very SOB and also his stomach was tight and she heard some crackles in lung base.Please call facilty and arrange for appt tomorrow

## 2012-02-06 NOTE — Telephone Encounter (Signed)
Appointment 02/07/12.

## 2012-02-07 ENCOUNTER — Other Ambulatory Visit: Payer: Self-pay | Admitting: Family Medicine

## 2012-02-07 ENCOUNTER — Encounter: Payer: Self-pay | Admitting: Family Medicine

## 2012-02-07 ENCOUNTER — Ambulatory Visit (INDEPENDENT_AMBULATORY_CARE_PROVIDER_SITE_OTHER): Payer: PRIVATE HEALTH INSURANCE | Admitting: Family Medicine

## 2012-02-07 ENCOUNTER — Ambulatory Visit (HOSPITAL_COMMUNITY)
Admission: RE | Admit: 2012-02-07 | Discharge: 2012-02-07 | Disposition: A | Discharge status: Home / Self Care | Payer: PRIVATE HEALTH INSURANCE | Source: Ambulatory Visit | Attending: Family Medicine | Admitting: Family Medicine

## 2012-02-07 VITALS — BP 142/74 | HR 85 | Resp 18 | Ht 66.0 in | Wt 180.1 lb

## 2012-02-07 DIAGNOSIS — R1902 Left upper quadrant abdominal swelling, mass and lump: Secondary | ICD-10-CM

## 2012-02-07 DIAGNOSIS — R05 Cough: Secondary | ICD-10-CM

## 2012-02-07 DIAGNOSIS — F22 Delusional disorders: Secondary | ICD-10-CM

## 2012-02-07 DIAGNOSIS — J309 Allergic rhinitis, unspecified: Secondary | ICD-10-CM

## 2012-02-07 DIAGNOSIS — R0602 Shortness of breath: Secondary | ICD-10-CM | POA: Insufficient documentation

## 2012-02-07 DIAGNOSIS — E785 Hyperlipidemia, unspecified: Secondary | ICD-10-CM

## 2012-02-07 DIAGNOSIS — R059 Cough, unspecified: Secondary | ICD-10-CM | POA: Insufficient documentation

## 2012-02-07 DIAGNOSIS — I10 Essential (primary) hypertension: Secondary | ICD-10-CM

## 2012-02-07 DIAGNOSIS — F0390 Unspecified dementia without behavioral disturbance: Secondary | ICD-10-CM

## 2012-02-07 DIAGNOSIS — R918 Other nonspecific abnormal finding of lung field: Secondary | ICD-10-CM | POA: Insufficient documentation

## 2012-02-07 NOTE — Progress Notes (Signed)
  Subjective:    Patient ID: [name] [name], male    DOB: [birth year], 78 y.o.   MRN: [id number]  HPI Pt in today with c/o worsening abdominal pain , which is generalized, states he is having increased difficulty breathing, just feels as though his health is worsening and wants to be checked. Chronic c/o urine going up into his chest. No reported change in BM , specifically no diarheah or constipation or bloody stool Also c/o increased clear nasal congestion and drainage, denies fever or chills or productive cough   Review of Systems See HPI Denies recent fever or chills. Denies sinus pressure, ear pain or sore throat. Denies chest congestion, productive cough or wheezing. Denies chest pains, palpitations and leg swelling .   Denies dysuria, frequency, hesitancy or incontinence. Chronic generalized joint pain with mild limitation in mobility. Denies headaches, seizures, numbness, or tingling. Denies depression, anxiety or insomnia. Denies skin break down or rash.        Objective:   Physical Exam  Patient alert  and in no cardiopulmonary distress.severed dementia with paranoid ideation  HEENT: No facial asymmetry, EOMI, no sinus tenderness,  oropharynx pink and moist.  Neck supple no adenopathy.  Chest: decreased air entry in RLL with few crackles  CVS: S1, S2 no murmurs, no S3.  ABD: distended , superficial RUQ tenderness, possible mass in RUQ, BS normal Ext: No edema  [id number]  ROM spine, shoulders, hips and knees.  Skin: Intact, no ulcerations or rash noted.  Psych: Good eye contact, normal affect. Memory loss not anxious or depressed appearing.  CNS: CN 2-12 intact, power, tone and sensation normal throughout.       Assessment & Plan:

## 2012-02-07 NOTE — Patient Instructions (Signed)
F/u in 2.5 month  Please cancel sooner appt.  Labs today cbc, tsh, lipid, cmp   You need a CXR today, order has been sent  You are referred for a scan of your abdomen to evaluate pain and swelling and possible mass

## 2012-02-08 LAB — CBC
MCH: 26.9 pg (ref 26.0–34.0)
MCHC: 28.9 g/dL — ABNORMAL LOW (ref 30.0–36.0)
MCV: 93 fL (ref 78.0–100.0)
Platelets: 202 10*3/uL (ref 150–400)
RDW: 15.6 % — ABNORMAL HIGH (ref 11.5–15.5)
WBC: 7.3 10*3/uL (ref 4.0–10.5)

## 2012-02-08 LAB — COMPREHENSIVE METABOLIC PANEL
ALT: 8 U/L (ref 0–53)
AST: 13 U/L (ref 0–37)
CO2: 23 mEq/L (ref 19–32)
Calcium: 9.8 mg/dL (ref 8.4–10.5)
Chloride: 113 mEq/L — ABNORMAL HIGH (ref 96–112)
Potassium: 4.7 mEq/L (ref 3.5–5.3)
Sodium: 142 mEq/L (ref 135–145)
Total Protein: 7.4 g/dL (ref 6.0–8.3)

## 2012-02-08 LAB — LIPID PANEL

## 2012-02-09 LAB — HEMOGLOBIN A1C: Hgb A1c MFr Bld: 6.3 % — ABNORMAL HIGH (ref <5.7)

## 2012-02-10 NOTE — Assessment & Plan Note (Signed)
markedly elevated TG needs to reduce butter , cheese, fried foods

## 2012-02-10 NOTE — Assessment & Plan Note (Signed)
Abnormal clinical exam, obtain CXR

## 2012-02-10 NOTE — Assessment & Plan Note (Signed)
Controlled, no change in medication  

## 2012-02-10 NOTE — Assessment & Plan Note (Signed)
abd pain and mass, obtain scan

## 2012-02-10 NOTE — Assessment & Plan Note (Signed)
Increased symptoms with season change, no med change at this time

## 2012-02-10 NOTE — Assessment & Plan Note (Signed)
Persistent delusions around abdominal contents in particular

## 2012-02-13 ENCOUNTER — Other Ambulatory Visit: Payer: Self-pay

## 2012-02-13 DIAGNOSIS — Z125 Encounter for screening for malignant neoplasm of prostate: Secondary | ICD-10-CM

## 2012-02-13 MED ORDER — CLONAZEPAM 1 MG PO TABS: ORAL_TABLET | ORAL | Status: DC

## 2012-02-19 DIAGNOSIS — R5381 Other malaise: Secondary | ICD-10-CM

## 2012-02-19 DIAGNOSIS — N3942 Incontinence without sensory awareness: Secondary | ICD-10-CM

## 2012-02-19 DIAGNOSIS — G8929 Other chronic pain: Secondary | ICD-10-CM

## 2012-02-19 DIAGNOSIS — R5383 Other fatigue: Secondary | ICD-10-CM

## 2012-02-19 DIAGNOSIS — R52 Pain, unspecified: Secondary | ICD-10-CM

## 2012-02-25 ENCOUNTER — Ambulatory Visit (HOSPITAL_COMMUNITY): Payer: PRIVATE HEALTH INSURANCE

## 2012-02-27 ENCOUNTER — Ambulatory Visit (HOSPITAL_COMMUNITY)
Admission: RE | Admit: 2012-02-27 | Discharge: 2012-02-27 | Disposition: A | Discharge status: Home / Self Care | Payer: PRIVATE HEALTH INSURANCE | Source: Ambulatory Visit | Attending: Family Medicine | Admitting: Family Medicine

## 2012-02-27 DIAGNOSIS — R1902 Left upper quadrant abdominal swelling, mass and lump: Secondary | ICD-10-CM | POA: Insufficient documentation

## 2012-02-27 DIAGNOSIS — R1012 Left upper quadrant pain: Secondary | ICD-10-CM | POA: Insufficient documentation

## 2012-02-27 MED ORDER — IOHEXOL 300 MG/ML  SOLN
100.0000 mL | Freq: Once | INTRAMUSCULAR | Status: AC | PRN
  Administered 2012-02-27: 100 mL via INTRAVENOUS

## 2012-03-03 ENCOUNTER — Other Ambulatory Visit: Payer: Self-pay | Admitting: Family Medicine

## 2012-03-03 NOTE — Addendum Note (Signed)
Addended by: Abner Greenspan on: 03/03/2012 08:32 AM   Modules accepted: Orders

## 2012-03-04 LAB — PSA, MEDICARE: PSA: 0.75 ng/mL (ref <=4.00)

## 2012-04-03 ENCOUNTER — Telehealth: Payer: Self-pay

## 2012-04-03 ENCOUNTER — Telehealth: Payer: Self-pay | Admitting: Family Medicine

## 2012-04-03 NOTE — Telephone Encounter (Signed)
[facility] notified.

## 2012-04-03 NOTE — Telephone Encounter (Signed)
Shawn aware that Toni Amend had already spoken with someone this am and the ER was advised

## 2012-04-03 NOTE — Telephone Encounter (Signed)
There atre no openings available this week for me , unsure of other provider, if she has openings, pt may be brought in for eval of nurse's concerns, i she does not , then he needs to go to the urgent care or ed for further eval since they have called concerned

## 2012-04-10 DIAGNOSIS — J929 Pleural plaque without asbestos: Secondary | ICD-10-CM

## 2012-04-10 HISTORY — DX: Pleural plaque without asbestos: J92.9

## 2012-04-16 ENCOUNTER — Encounter (HOSPITAL_COMMUNITY): Payer: Self-pay

## 2012-04-16 ENCOUNTER — Ambulatory Visit (HOSPITAL_COMMUNITY)
Admission: RE | Admit: 2012-04-16 | Discharge: 2012-04-16 | Disposition: A | Discharge status: Home / Self Care | Payer: PRIVATE HEALTH INSURANCE | Source: Ambulatory Visit | Attending: Family Medicine | Admitting: Family Medicine

## 2012-04-16 ENCOUNTER — Encounter: Payer: Self-pay | Admitting: Family Medicine

## 2012-04-16 ENCOUNTER — Ambulatory Visit (INDEPENDENT_AMBULATORY_CARE_PROVIDER_SITE_OTHER): Payer: PRIVATE HEALTH INSURANCE | Admitting: Family Medicine

## 2012-04-16 ENCOUNTER — Other Ambulatory Visit: Payer: Self-pay

## 2012-04-16 VITALS — BP 126/74 | HR 96 | Resp 20 | Ht 66.0 in | Wt 184.0 lb

## 2012-04-16 DIAGNOSIS — R911 Solitary pulmonary nodule: Secondary | ICD-10-CM | POA: Insufficient documentation

## 2012-04-16 DIAGNOSIS — R7309 Other abnormal glucose: Secondary | ICD-10-CM

## 2012-04-16 DIAGNOSIS — F0390 Unspecified dementia without behavioral disturbance: Secondary | ICD-10-CM

## 2012-04-16 DIAGNOSIS — R7303 Prediabetes: Secondary | ICD-10-CM

## 2012-04-16 DIAGNOSIS — Z1211 Encounter for screening for malignant neoplasm of colon: Secondary | ICD-10-CM

## 2012-04-16 DIAGNOSIS — R0902 Hypoxemia: Secondary | ICD-10-CM

## 2012-04-16 DIAGNOSIS — R7301 Impaired fasting glucose: Secondary | ICD-10-CM

## 2012-04-16 DIAGNOSIS — R05 Cough: Secondary | ICD-10-CM | POA: Insufficient documentation

## 2012-04-16 DIAGNOSIS — R109 Unspecified abdominal pain: Secondary | ICD-10-CM

## 2012-04-16 DIAGNOSIS — I10 Essential (primary) hypertension: Secondary | ICD-10-CM

## 2012-04-16 DIAGNOSIS — R0602 Shortness of breath: Secondary | ICD-10-CM | POA: Insufficient documentation

## 2012-04-16 DIAGNOSIS — R918 Other nonspecific abnormal finding of lung field: Secondary | ICD-10-CM

## 2012-04-16 DIAGNOSIS — R059 Cough, unspecified: Secondary | ICD-10-CM | POA: Insufficient documentation

## 2012-04-16 LAB — POC HEMOCCULT BLD/STL (OFFICE/1-CARD/DIAGNOSTIC): Fecal Occult Blood, POC: NEGATIVE

## 2012-04-16 MED ORDER — METFORMIN HCL 500 MG PO TABS: 500.0000 mg | ORAL_TABLET | Freq: Two times a day (BID) | ORAL | Status: DC

## 2012-04-16 MED ORDER — IOHEXOL 350 MG/ML SOLN
100.0000 mL | Freq: Once | INTRAVENOUS | Status: AC | PRN
  Administered 2012-04-16: 100 mL via INTRAVENOUS

## 2012-04-16 MED ORDER — RIVASTIGMINE 13.3 MG/24HR TD PT24: 1.0000 | MEDICATED_PATCH | Freq: Every day | TRANSDERMAL | Status: DC

## 2012-04-16 NOTE — Patient Instructions (Addendum)
F/u in 3 month  You are referred for lung scan need to be sure you have no clot in your lung  You need oxygen .  You will be started on medication to help to keep blood sugar normal. You are prediabetic. You need to cut back on sweets and carbohydrates  hBA1C and chem 7 in 3 month

## 2012-04-17 ENCOUNTER — Other Ambulatory Visit (HOSPITAL_COMMUNITY): Payer: PRIVATE HEALTH INSURANCE

## 2012-04-17 DIAGNOSIS — R918 Other nonspecific abnormal finding of lung field: Secondary | ICD-10-CM | POA: Insufficient documentation

## 2012-04-17 NOTE — Progress Notes (Signed)
Patient in with shortness of breath with exertion and talking.  O2 sat noted to be 85% after ambulating to the room.  O2 applied at 2lpm and O2 sat up to 93%.  Patient ambulated around the office on room air and O2 sat dropped to 74%

## 2012-04-20 NOTE — Assessment & Plan Note (Signed)
Chronic and unchanged, normal abdominal and pelvic scan 02/2012

## 2012-04-20 NOTE — Assessment & Plan Note (Signed)
Unchanged, stable on current meds, continue same

## 2012-04-20 NOTE — Assessment & Plan Note (Signed)
Needs supplemental oxygen and referred to pulmonary

## 2012-04-20 NOTE — Assessment & Plan Note (Signed)
Controlled, no change in medication  

## 2012-04-20 NOTE — Progress Notes (Signed)
  Subjective:    Patient ID: [name] [name], male    DOB: Jan 29, 1931, 78 y.o.   MRN: [id number]  HPI Pt in with c/o increased shortness of breath even at rest worse in the past 1 to 2 weeks. Denies fever, chills or productive cough. He is unable to reliably provide a history due to severe dementia, accompanying staff member states he has also been c/o abdominal pain more frequently during this time. His appetite is unchanged and he is having regular bowel movements   Review of Systems See HPI Denies recent fever or chills. Denies sinus pressure, nasal congestion, ear pain or sore throat. Denies chest congestion, productive cough or wheezing. Denies chest pains, palpitations and leg swelling  Denies dysuria, frequency, hesitancy or incontinence. Chronic  joint pain,  and limitation in mobility.  Denies skin break down or rash.        Objective:   Physical Exam Patient alert and in  cardiopulmonary distress.Desaturates to the 80's with minimal activity  HEENT: No facial asymmetry, EOMI, no sinus tenderness,  oropharynx pink and moist.  Neck supple no adenopathy.  Chest: decreased air entry, scattered crackles CVS: S1, S2 no murmurs, no S3.  ABD: Soft non tender. Bowel sounds normal.no organomegaly or nmasses  Ext: No edema  MS: decreased  ROM spine, shoulders, hips and knees.  Skin: Intact, no ulcerations or rash noted.  Psych: Good eye contact, normal affect. Memory lossnot anxious or depressed appearing.  CNS: CN 2-12 intact, power,  normal throughout.        Assessment & Plan:

## 2012-04-20 NOTE — Assessment & Plan Note (Signed)
Pt to be started on low carb diet as well as metformin

## 2012-04-22 DIAGNOSIS — R5381 Other malaise: Secondary | ICD-10-CM

## 2012-04-22 DIAGNOSIS — N3942 Incontinence without sensory awareness: Secondary | ICD-10-CM

## 2012-04-22 DIAGNOSIS — R52 Pain, unspecified: Secondary | ICD-10-CM

## 2012-04-22 DIAGNOSIS — R5383 Other fatigue: Secondary | ICD-10-CM

## 2012-04-22 DIAGNOSIS — G8929 Other chronic pain: Secondary | ICD-10-CM

## 2012-04-24 ENCOUNTER — Institutional Professional Consult (permissible substitution): Payer: PRIVATE HEALTH INSURANCE | Admitting: Internal Medicine

## 2012-04-28 ENCOUNTER — Telehealth: Payer: Self-pay | Admitting: Family Medicine

## 2012-04-29 ENCOUNTER — Other Ambulatory Visit: Payer: Self-pay | Admitting: Family Medicine

## 2012-04-29 ENCOUNTER — Telehealth: Payer: Self-pay | Admitting: Family Medicine

## 2012-05-02 NOTE — Telephone Encounter (Signed)
Patient is aware 

## 2012-05-02 NOTE — Telephone Encounter (Signed)
Becky is aware

## 2012-05-14 ENCOUNTER — Other Ambulatory Visit: Payer: Self-pay | Admitting: Family Medicine

## 2012-05-20 ENCOUNTER — Telehealth: Payer: Self-pay | Admitting: Family Medicine

## 2012-05-20 ENCOUNTER — Other Ambulatory Visit: Payer: Self-pay | Admitting: Family Medicine

## 2012-05-20 NOTE — Telephone Encounter (Signed)
Ok to refill 

## 2012-05-20 NOTE — Telephone Encounter (Signed)
As far as i am aware i am not prescribing eye drops for pt, pls ask the facility to contact the eye specialist treating him

## 2012-05-21 NOTE — Telephone Encounter (Signed)
Facility aware.

## 2012-05-23 ENCOUNTER — Other Ambulatory Visit: Payer: Self-pay | Admitting: Family Medicine

## 2012-06-09 ENCOUNTER — Ambulatory Visit: Payer: PRIVATE HEALTH INSURANCE | Admitting: Family Medicine

## 2012-06-12 ENCOUNTER — Emergency Department (HOSPITAL_COMMUNITY): Payer: PRIVATE HEALTH INSURANCE

## 2012-06-12 ENCOUNTER — Encounter (HOSPITAL_COMMUNITY): Payer: Self-pay | Admitting: *Deleted

## 2012-06-12 ENCOUNTER — Observation Stay (HOSPITAL_COMMUNITY)
Admission: EM | Admit: 2012-06-12 | Discharge: 2012-06-14 | Disposition: A | Discharge status: Home / Self Care | Payer: PRIVATE HEALTH INSURANCE | Attending: Internal Medicine | Admitting: Internal Medicine

## 2012-06-12 DIAGNOSIS — R7309 Other abnormal glucose: Secondary | ICD-10-CM

## 2012-06-12 DIAGNOSIS — R05 Cough: Secondary | ICD-10-CM

## 2012-06-12 DIAGNOSIS — I498 Other specified cardiac arrhythmias: Secondary | ICD-10-CM | POA: Insufficient documentation

## 2012-06-12 DIAGNOSIS — R7303 Prediabetes: Secondary | ICD-10-CM

## 2012-06-12 DIAGNOSIS — K219 Gastro-esophageal reflux disease without esophagitis: Secondary | ICD-10-CM | POA: Diagnosis present

## 2012-06-12 DIAGNOSIS — I10 Essential (primary) hypertension: Secondary | ICD-10-CM | POA: Diagnosis present

## 2012-06-12 DIAGNOSIS — H409 Unspecified glaucoma: Secondary | ICD-10-CM

## 2012-06-12 DIAGNOSIS — R0602 Shortness of breath: Secondary | ICD-10-CM

## 2012-06-12 DIAGNOSIS — H9190 Unspecified hearing loss, unspecified ear: Secondary | ICD-10-CM | POA: Insufficient documentation

## 2012-06-12 DIAGNOSIS — K5909 Other constipation: Secondary | ICD-10-CM | POA: Insufficient documentation

## 2012-06-12 DIAGNOSIS — J309 Allergic rhinitis, unspecified: Secondary | ICD-10-CM

## 2012-06-12 DIAGNOSIS — M6250 Muscle wasting and atrophy, not elsewhere classified, unspecified site: Secondary | ICD-10-CM | POA: Insufficient documentation

## 2012-06-12 DIAGNOSIS — D649 Anemia, unspecified: Secondary | ICD-10-CM | POA: Diagnosis present

## 2012-06-12 DIAGNOSIS — M549 Dorsalgia, unspecified: Secondary | ICD-10-CM

## 2012-06-12 DIAGNOSIS — R0902 Hypoxemia: Secondary | ICD-10-CM | POA: Insufficient documentation

## 2012-06-12 DIAGNOSIS — R001 Bradycardia, unspecified: Secondary | ICD-10-CM

## 2012-06-12 DIAGNOSIS — F0390 Unspecified dementia without behavioral disturbance: Secondary | ICD-10-CM

## 2012-06-12 DIAGNOSIS — R918 Other nonspecific abnormal finding of lung field: Secondary | ICD-10-CM

## 2012-06-12 DIAGNOSIS — R4182 Altered mental status, unspecified: Principal | ICD-10-CM | POA: Diagnosis present

## 2012-06-12 DIAGNOSIS — R079 Chest pain, unspecified: Secondary | ICD-10-CM | POA: Insufficient documentation

## 2012-06-12 DIAGNOSIS — R569 Unspecified convulsions: Secondary | ICD-10-CM | POA: Diagnosis present

## 2012-06-12 DIAGNOSIS — IMO0002 Reserved for concepts with insufficient information to code with codable children: Secondary | ICD-10-CM | POA: Diagnosis present

## 2012-06-12 DIAGNOSIS — R109 Unspecified abdominal pain: Secondary | ICD-10-CM

## 2012-06-12 DIAGNOSIS — E119 Type 2 diabetes mellitus without complications: Secondary | ICD-10-CM | POA: Diagnosis present

## 2012-06-12 DIAGNOSIS — G4700 Insomnia, unspecified: Secondary | ICD-10-CM

## 2012-06-12 DIAGNOSIS — F411 Generalized anxiety disorder: Secondary | ICD-10-CM

## 2012-06-12 DIAGNOSIS — J9610 Chronic respiratory failure, unspecified whether with hypoxia or hypercapnia: Secondary | ICD-10-CM | POA: Diagnosis present

## 2012-06-12 DIAGNOSIS — R5383 Other fatigue: Secondary | ICD-10-CM

## 2012-06-12 DIAGNOSIS — E785 Hyperlipidemia, unspecified: Secondary | ICD-10-CM | POA: Diagnosis present

## 2012-06-12 HISTORY — DX: Chronic respiratory failure, unspecified whether with hypoxia or hypercapnia: J96.10

## 2012-06-12 HISTORY — DX: Reserved for concepts with insufficient information to code with codable children: IMO0002

## 2012-06-12 HISTORY — DX: Unspecified dementia without behavioral disturbance: F03.90

## 2012-06-12 HISTORY — DX: Unspecified hearing loss, unspecified ear: H91.90

## 2012-06-12 HISTORY — DX: Altered mental status, unspecified: R41.82

## 2012-06-12 HISTORY — DX: Type 2 diabetes mellitus without complications: E11.9

## 2012-06-12 HISTORY — DX: Mesothelioma, unspecified: C45.9

## 2012-06-12 HISTORY — DX: Unspecified convulsions: R56.9

## 2012-06-12 LAB — URINALYSIS, ROUTINE W REFLEX MICROSCOPIC
Ketones, ur: NEGATIVE mg/dL
Leukocytes, UA: NEGATIVE
Nitrite: NEGATIVE
Protein, ur: NEGATIVE mg/dL
Urobilinogen, UA: 0.2 mg/dL (ref 0.0–1.0)

## 2012-06-12 LAB — CBC WITH DIFFERENTIAL/PLATELET
Basophils Absolute: 0 10*3/uL (ref 0.0–0.1)
Basophils Relative: 0 % (ref 0–1)
Eosinophils Absolute: 0.2 10*3/uL (ref 0.0–0.7)
MCH: 26.8 pg (ref 26.0–34.0)
MCHC: 29.3 g/dL — ABNORMAL LOW (ref 30.0–36.0)
Neutro Abs: 4.5 10*3/uL (ref 1.7–7.7)
Neutrophils Relative %: 60 % (ref 43–77)
Platelets: 161 10*3/uL (ref 150–400)

## 2012-06-12 LAB — BASIC METABOLIC PANEL
BUN: 18 mg/dL (ref 6–23)
Calcium: 10.2 mg/dL (ref 8.4–10.5)
GFR calc Af Amer: 63 mL/min — ABNORMAL LOW (ref >90)
GFR calc non Af Amer: 54 mL/min — ABNORMAL LOW (ref >90)
Glucose, Bld: 96 mg/dL (ref 70–99)
Sodium: 138 mEq/L (ref 135–145)

## 2012-06-12 LAB — TROPONIN I: Troponin I: <0.3 ng/mL (ref <0.30)

## 2012-06-12 LAB — GLUCOSE, CAPILLARY: Glucose-Capillary: 146 mg/dL — ABNORMAL HIGH (ref 70–99)

## 2012-06-12 MED ORDER — ENOXAPARIN SODIUM 40 MG/0.4ML ~~LOC~~ SOLN
40.0000 mg | SUBCUTANEOUS | Status: DC
  Administered 2012-06-12 – 2012-06-13 (×2): 40 mg via SUBCUTANEOUS
  Filled 2012-06-12 (×2): qty 0.4

## 2012-06-12 MED ORDER — ONDANSETRON HCL 4 MG/2ML IJ SOLN: 4.0000 mg | Freq: Four times a day (QID) | INTRAMUSCULAR | Status: DC | PRN

## 2012-06-12 MED ORDER — BRIMONIDINE TARTRATE 0.15 % OP SOLN
1.0000 [drp] | Freq: Every day | OPHTHALMIC | Status: DC
  Administered 2012-06-13: 1 [drp] via OPHTHALMIC
  Filled 2012-06-12 (×9): qty 0.1

## 2012-06-12 MED ORDER — RIVASTIGMINE 13.3 MG/24HR TD PT24
1.0000 | MEDICATED_PATCH | Freq: Every day | TRANSDERMAL | Status: DC
  Filled 2012-06-12: qty 1

## 2012-06-12 MED ORDER — SIMVASTATIN 20 MG PO TABS
20.0000 mg | ORAL_TABLET | Freq: Every day | ORAL | Status: DC
  Administered 2012-06-12 – 2012-06-13 (×2): 20 mg via ORAL
  Filled 2012-06-12 (×2): qty 1

## 2012-06-12 MED ORDER — CLONAZEPAM 0.125 MG PO TBDP: 0.1250 mg | ORAL_TABLET | Freq: Two times a day (BID) | ORAL | Status: DC | PRN

## 2012-06-12 MED ORDER — BISACODYL 5 MG PO TBEC: 5.0000 mg | DELAYED_RELEASE_TABLET | Freq: Every day | ORAL | Status: DC | PRN

## 2012-06-12 MED ORDER — DOCUSATE SODIUM 100 MG PO CAPS
100.0000 mg | ORAL_CAPSULE | Freq: Two times a day (BID) | ORAL | Status: DC
  Administered 2012-06-12 – 2012-06-13 (×2): 100 mg via ORAL
  Filled 2012-06-12 (×2): qty 1

## 2012-06-12 MED ORDER — POTASSIUM CHLORIDE IN NACL 20-0.9 MEQ/L-% IV SOLN
INTRAVENOUS | Status: DC
  Administered 2012-06-12: 23:00:00 via INTRAVENOUS

## 2012-06-12 MED ORDER — IPRATROPIUM BROMIDE 0.02 % IN SOLN: 0.5000 mg | Freq: Four times a day (QID) | RESPIRATORY_TRACT | Status: DC | PRN

## 2012-06-12 MED ORDER — ACETAZOLAMIDE 250 MG PO TABS
250.0000 mg | ORAL_TABLET | Freq: Three times a day (TID) | ORAL | Status: DC
  Administered 2012-06-13 – 2012-06-14 (×6): 250 mg via ORAL
  Filled 2012-06-12 (×15): qty 1

## 2012-06-12 MED ORDER — HYDROXYZINE HCL 25 MG PO TABS
25.0000 mg | ORAL_TABLET | Freq: Every day | ORAL | Status: DC
  Administered 2012-06-12 – 2012-06-13 (×2): 25 mg via ORAL
  Filled 2012-06-12 (×2): qty 1

## 2012-06-12 MED ORDER — MONTELUKAST SODIUM 10 MG PO TABS
10.0000 mg | ORAL_TABLET | Freq: Every day | ORAL | Status: DC
  Administered 2012-06-12 – 2012-06-13 (×2): 10 mg via ORAL
  Filled 2012-06-12 (×2): qty 1

## 2012-06-12 MED ORDER — SODIUM CHLORIDE 0.9 % IJ SOLN
3.0000 mL | Freq: Two times a day (BID) | INTRAMUSCULAR | Status: DC
  Administered 2012-06-12 – 2012-06-13 (×3): 3 mL via INTRAVENOUS
  Filled 2012-06-12 (×3): qty 3

## 2012-06-12 MED ORDER — DORZOLAMIDE HCL-TIMOLOL MAL 2-0.5 % OP SOLN
1.0000 [drp] | Freq: Two times a day (BID) | OPHTHALMIC | Status: DC
  Administered 2012-06-13 – 2012-06-14 (×3): 1 [drp] via OPHTHALMIC
  Filled 2012-06-12: qty 10

## 2012-06-12 MED ORDER — OXYBUTYNIN CHLORIDE ER 5 MG PO TB24
5.0000 mg | ORAL_TABLET | Freq: Every day | ORAL | Status: DC
  Administered 2012-06-13 – 2012-06-14 (×2): 5 mg via ORAL
  Filled 2012-06-12 (×2): qty 1

## 2012-06-12 MED ORDER — AMLODIPINE BESYLATE 5 MG PO TABS
5.0000 mg | ORAL_TABLET | Freq: Every day | ORAL | Status: DC
  Administered 2012-06-13 – 2012-06-14 (×2): 5 mg via ORAL
  Filled 2012-06-12 (×2): qty 1

## 2012-06-12 MED ORDER — SODIUM CHLORIDE 0.9 % IV SOLN
Freq: Once | INTRAVENOUS | Status: AC
  Administered 2012-06-12: 100 mL/h via INTRAVENOUS

## 2012-06-12 MED ORDER — ONDANSETRON HCL 4 MG/2ML IJ SOLN: 4.0000 mg | Freq: Three times a day (TID) | INTRAMUSCULAR | Status: DC | PRN

## 2012-06-12 MED ORDER — METFORMIN HCL 500 MG PO TABS
500.0000 mg | ORAL_TABLET | Freq: Two times a day (BID) | ORAL | Status: DC
  Administered 2012-06-13 – 2012-06-14 (×3): 500 mg via ORAL
  Filled 2012-06-12 (×3): qty 1

## 2012-06-12 MED ORDER — DORZOLAMIDE HCL-TIMOLOL MAL PF 22.3-6.8 MG/ML OP SOLN: 1.0000 [drp] | Freq: Two times a day (BID) | OPHTHALMIC | Status: DC

## 2012-06-12 MED ORDER — PANTOPRAZOLE SODIUM 40 MG PO TBEC
40.0000 mg | DELAYED_RELEASE_TABLET | Freq: Every day | ORAL | Status: DC
  Administered 2012-06-13 – 2012-06-14 (×2): 40 mg via ORAL
  Filled 2012-06-12 (×2): qty 1

## 2012-06-12 MED ORDER — ALBUTEROL SULFATE (5 MG/ML) 0.5% IN NEBU: 2.5000 mg | INHALATION_SOLUTION | Freq: Four times a day (QID) | RESPIRATORY_TRACT | Status: DC | PRN

## 2012-06-12 MED ORDER — INSULIN ASPART 100 UNIT/ML ~~LOC~~ SOLN: 0.0000 [IU] | Freq: Three times a day (TID) | SUBCUTANEOUS | Status: DC

## 2012-06-12 MED ORDER — ACETAMINOPHEN 325 MG PO TABS: 650.0000 mg | ORAL_TABLET | ORAL | Status: DC | PRN

## 2012-06-12 MED ORDER — TRAZODONE HCL 50 MG PO TABS: 25.0000 mg | ORAL_TABLET | Freq: Every evening | ORAL | Status: DC | PRN

## 2012-06-12 MED ORDER — FLEET ENEMA 7-19 GM/118ML RE ENEM: 1.0000 | ENEMA | Freq: Once | RECTAL | Status: AC | PRN

## 2012-06-12 MED ORDER — LATANOPROST 0.005 % OP SOLN
1.0000 [drp] | Freq: Every day | OPHTHALMIC | Status: DC
  Administered 2012-06-12 – 2012-06-13 (×2): 1 [drp] via OPHTHALMIC
  Filled 2012-06-12: qty 2.5

## 2012-06-12 MED ORDER — RISPERIDONE 0.5 MG PO TABS
0.2500 mg | ORAL_TABLET | Freq: Every day | ORAL | Status: DC
  Administered 2012-06-12 – 2012-06-13 (×2): 0.25 mg via ORAL
  Filled 2012-06-12 (×2): qty 1

## 2012-06-12 MED ORDER — INSULIN ASPART 100 UNIT/ML ~~LOC~~ SOLN: 0.0000 [IU] | Freq: Every day | SUBCUTANEOUS | Status: DC

## 2012-06-12 NOTE — ED Provider Notes (Addendum)
History   This chart was scribed for [name] Kaplan, MD by [name]. The patient was seen in room APA07/APA07 and the patient's care was started at 2:19PM    CSN: [id number]  Arrival date & time 06/12/12  1319   None     Chief Complaint  Patient presents with  . Altered Mental Status    (Consider location/radiation/quality/duration/timing/severity/associated sxs/prior treatment) Patient is a 78 y.o. male presenting with altered mental status. The history is provided by a relative. The history is limited by the condition of the patient. No language interpreter was used.  Altered Mental Status This is a new problem. The current episode started 6 to 12 hours ago. The problem occurs constantly. The problem has not changed since onset.Pertinent negatives include no chest pain, no abdominal pain, no headaches and no shortness of breath. Nothing aggravates the symptoms. Nothing relieves the symptoms. He has tried nothing for the symptoms. The treatment provided no relief.    [name] [name] is a 78 y.o. male , with a hx of pleural plaques (due to asbestos exposure 1 month ago), who presents to the Emergency Department complaining of sudden, progressively worsening, altered mental status, onset today. The pt's relative reports the pt was found unresponsive this morning at [facility] facility by a caregiver. In addition, the pt's relative informs that the pt is breathing 2 liters of oxygen at [facility], 24/7. The pt has a hx of hypertension, glycoma, cataract surgery, and chronic abdominal pain.    The pt's relative denies any hx of diabetes, stroke, or seizures.   The pt does not smoke or drink alcohol.   PCP is Dr. Lodema Hong.    Past Medical History  Diagnosis Date  . Glaucoma   . Anxiety   . GERD (gastroesophageal reflux disease)   . Hyperlipidemia   . Hypertension   . Chronic abdominal pain   . Mesothelioma     Past Surgical History  Procedure Date  .  Hernia repair   . Prostate surgery   . Right eye laser surgery 2005  . Left eye surgery for glaucoma at [facility] 10/2009    Family History  Problem Relation Age of Onset  . Lung cancer Sister     History  Substance Use Topics  . Smoking status: Former Games developer  . Smokeless tobacco: Not on file  . Alcohol Use: No      Review of Systems  Respiratory: Negative for shortness of breath.   Cardiovascular: Negative for chest pain.  Gastrointestinal: Negative for abdominal pain.  Neurological: Negative for headaches.  Psychiatric/Behavioral: Positive for altered mental status.  All other systems reviewed and are negative.    Allergies  Review of patient's allergies indicates no known allergies.  Home Medications   Current Outpatient Rx  Name Route Sig Dispense Refill  . ACETAZOLAMIDE 250 MG PO TABS Oral Take 250 mg by mouth. Take one tablet by mouth with meals and at bedtime     . ALUM & MAG HYDROXIDE-SIMETH 200-200-20 MG/5ML PO SUSP Oral Take by mouth. Take 2 to 4 teasponful 4 times daily as needed     . AMLODIPINE BESYLATE 5 MG PO TABS Oral Take 5 mg by mouth daily.      Marland Kitchen BRIMONIDINE TARTRATE 0.1 % OP SOLN  1 drop. One drop in both eyes at bedtime     . CALCIUM ANTACID 500 MG PO CHEW  CHEW 2 TABLETS BY MOUTH EVERY 4-6 HOURS AS NEEDED. 150 each 3  .  CLONAZEPAM 1 MG PO TABS  1/2 tab in the am and 1/2 in the PM and 1 at bedtime 60 tablet 3  . DEXTROMETHORPHAN-GUAIFENESIN 10-100 MG/5ML PO SYRP Oral Take 5 mLs by mouth every 12 (twelve) hours. One tbsp by mouth as needed for simple cough     . DIPHENHYDRAMINE-APAP (SLEEP) 25-500 MG PO TABS Oral Take 1 tablet by mouth as directed.      Marland Kitchen DOCUSATE SODIUM 100 MG PO TABS Oral Take 100 mg by mouth 2 (two) times daily.      . DORZOLAMIDE HCL-TIMOLOL MAL PF 22.3-6.8 MG/ML OP SOLN Ophthalmic Apply to eye. One drop in each eye two times a day     . HYDROXYZINE HCL 25 MG PO TABS Oral Take 25 mg by mouth at bedtime.      Marland Kitchen LATANOPROST 0.005 %  OP SOLN Left Eye Place 1 drop into the left eye at bedtime.      Marland Kitchen LOVASTATIN 40 MG PO TABS Oral Take 40 mg by mouth at bedtime.      Marland Kitchen MAGNESIUM HYDROXIDE 400 MG/5ML PO SUSP Oral Take 10 mLs by mouth every three (3) days as needed.      Marland Kitchen METFORMIN HCL 500 MG PO TABS Oral Take 1 tablet (500 mg total) by mouth 2 (two) times daily with a meal. 60 tablet 11  . MONTELUKAST SODIUM 10 MG PO TABS Oral Take 10 mg by mouth at bedtime.      . OMEPRAZOLE 20 MG PO TBEC Oral Take by mouth.      . OXYBUTYNIN CHLORIDE ER 5 MG PO TB24 Oral Take 5 mg by mouth daily.      Marland Kitchen POLYETHYLENE GLYCOL 3350 PO Oral Take by mouth. Mix 17g in 8 oz of water or juice     . POLYETHYLENE GLYCOL 3350 PO POWD  MIX 17 GRAMS IN 8 OUNCES OF WATER AND DRINK ONCE DAILY. 527 g 2  . PSEUDOEPHEDRINE-GUAIFENESIN ER 60-600 MG PO TB12 Oral Take 1 tablet by mouth every 12 (twelve) hours. Take one tablet by mouth two times a day as needed for nasal congestion and cough     . Q-PAP 325 MG PO TABS  TAKE 2 TABLETS (=650MG ) BY MOUTH EVERY 4 HOURS AS NEEDED FOR MILD PAIN OR FEVER. 60 each 0  . RISPERIDONE 0.25 MG PO TABS Oral Take 0.25 mg by mouth at bedtime.      Marland Kitchen RIVASTIGMINE 13.3 MG/24HR TD PT24 Transdermal Place 1 patch onto the skin daily. 30 patch 3  . VITAMIN D (ERGOCALCIFEROL) 50000 UNITS PO CAPS  TAKE 1 CAPSULE BY MOUTH ONCE WEEKLY. 4 capsule 3    BP 98/68  Pulse 79  Temp 98.2 F (36.8 C)  Resp 19  SpO2 92%  Physical Exam  Nursing note and vitals reviewed. Constitutional: He appears well-developed and well-nourished.  HENT:  Head: Atraumatic.  Right Ear: External ear normal.  Left Ear: External ear normal.  Nose: Nose normal.  Eyes: Conjunctivae normal and EOM are normal. Pupils are equal, round, and reactive to light.  Neck: Normal range of motion.  Cardiovascular: Normal rate and regular rhythm.        Faint heart sounds detected.   Pulmonary/Chest: Effort normal and breath sounds normal.       Bilateral equal breath  sounds with crackles detected on the left side.   Abdominal: Soft. He exhibits distension. He exhibits no mass. There is no tenderness.  Musculoskeletal: Normal range of motion. He exhibits  no edema.  Skin: Skin is warm and dry. No rash noted.    ED Course  Procedures (including critical care time)  DIAGNOSTIC STUDIES: Oxygen Saturation is 92% on [place], adcequate by my interpretation.    COORDINATION OF CARE:    2:27PM- Possible hospital admission discussed with pt and pt's family. Pt's family agrees to treatment.   Results for orders placed during the hospital encounter of 06/12/12  BASIC METABOLIC PANEL      Component Value Range   Sodium 138  135 - 145 mEq/L   Potassium 3.8  3.5 - 5.1 mEq/L   Chloride 107  96 - 112 mEq/L   CO2 24  19 - 32 mEq/L   Glucose, Bld 96  70 - 99 mg/dL   BUN 18  6 - 23 mg/dL   Creatinine, Ser 4.09  0.50 - 1.35 mg/dL   Calcium 81.1  8.4 - 91.4 mg/dL   GFR calc non Af Amer 54 (*) >90 mL/min   GFR calc Af Amer 63 (*) >90 mL/min  CBC WITH DIFFERENTIAL      Component Value Range   WBC 7.4  4.0 - 10.5 K/uL   RBC 3.96 (*) 4.22 - 5.81 MIL/uL   Hemoglobin 10.6 (*) 13.0 - 17.0 g/dL   HCT 78.2 (*) 95.6 - 21.3 %   MCV 91.4  78.0 - 100.0 fL   MCH 26.8  26.0 - 34.0 pg   MCHC 29.3 (*) 30.0 - 36.0 g/dL   RDW 08.6  57.8 - 46.9 %   Platelets 161  150 - 400 K/uL   Neutrophils Relative 60  43 - 77 %   Neutro Abs 4.5  1.7 - 7.7 K/uL   Lymphocytes Relative 29  12 - 46 %   Lymphs Abs 2.1  0.7 - 4.0 K/uL   Monocytes Relative 8  3 - 12 %   Monocytes Absolute 0.6  0.1 - 1.0 K/uL   Eosinophils Relative 3  0 - 5 %   Eosinophils Absolute 0.2  0.0 - 0.7 K/uL   Basophils Relative 0  0 - 1 %   Basophils Absolute 0.0  0.0 - 0.1 K/uL  TROPONIN I      Component Value Range   Troponin I <0.30  <0.30 ng/mL  MAGNESIUM      Component Value Range   Magnesium 1.9  1.5 - 2.5 mg/dL  PHOSPHORUS      Component Value Range   Phosphorus 3.4  2.3 - 4.6 mg/dL   Dg Chest 1  View  62/05/5283  *RADIOLOGY REPORT*  Clinical Data: Chest pain.  CHEST - 1 VIEW  Comparison: Feb 07, 2012.  Findings: Cardiomediastinal silhouette appears normal.  Right lung is clear.  Multiple rounded densities are again noted over the left hemithorax which are unchanged.  Prior exam and most consistent with pleural plaques as previously described.  Bony thorax is intact.  IMPRESSION: No change involving the left hemithorax pleural plaques compared to prior exam.  No acute cardiopulmonary abnormality seen.   Original Report Authenticated By: Venita Sheffield., M.D.    Ct Head Wo Contrast  06/12/2012  *RADIOLOGY REPORT*  Clinical Data: Altered mental status.  CT HEAD WITHOUT CONTRAST  Technique:  Contiguous axial images were obtained from the base of the skull through the vertex without contrast.  Comparison: None.  Findings: Bony calvarium is intact.  Mild diffuse cortical atrophy is noted.  No mass effect or midline shift is noted.  Ventricular size is  within normal limits.  There is no evidence of mass, hemorrhage or acute infarction.  IMPRESSION: Mild diffuse cortical atrophy.  No acute intracranial abnormality seen.   Original Report Authenticated By: Venita Sheffield., M.D.       No diagnosis found.    MDM  DDx: Sepsis syndrome ACS syndrome DKA ICH Stroke Infection - pneumonia/UTI/Cellulitis PE Dehydration Electrolyte abnormality Tox syndrome  Pt comes in with cc of AMS. Pt is a poor Historian for the current event. Pt was found unresponsive by nursing home, family called, when family arrived, he was responsive, but confused. No one, including the nursing home notices any seizure like activity, and there is no hx of seizure. He is on constant O2, no new meds. He is talking to me and is aox3, just not able to recognize his grand daughter (new finding). Dont think there is any hypoglycemia.  Concerns primarily for seizure, infection. Stroke possible. If it was seizure, it  would be 1st time seizure. He is on several meds that can cause change in sensorium as well, none of them new per family. His vitals are normal, no nuchal rigidity - dont suspect a meningeal process.  Probably would be a good patient for obs admission, given this undifferentiated process.  [name] Kaplan, MD 06/12/12 1618    Date: 06/12/2012  Rate: 69  Rhythm: normal sinus rhythm  QRS Axis: normal  Intervals: PR prolonged  ST/T Wave abnormalities: nonspecific ST/T changes  Conduction Disutrbances:first-degree A-V block   Narrative Interpretation:   Old EKG Reviewed: unchanged    [name] Kaplan, MD 06/12/12 1624

## 2012-06-12 NOTE — H&P (Signed)
[facility] History and Physical  [name] [name]  ION:[id number]  DOB: 1930/12/14   DOA: 06/12/2012   PCP:   Syliva Overman, MD   Note: The patient's hearing loss is so intense that it is impossible to get a history from him despite use of his hearing aid. His daughter will accompanies him gives most of history.  Chief Complaint:  Found unresponsive around midday today  HPI: [name] [name] is an 78 y.o. male.   African American resident of [facility]; extremely hard of hearing despite hearing aid; history of dementia with delusional features and recurrent bizarre medical complaints; strip COPD and asbestos exposure with multiple pleural nodules on chronic home oxygen; was found unresponsive and unarousable in bed  when called for lunch today; unclear how long he is been in this state. No evidence of trauma. EMS was called and by the time they arrived he was more arousable but still confused, did not recognize people around him, was unable to walk unassisted although he usually walks with a cane, and had to be  lifted onto the EMS stretcher.  The patient arrived at the emergency room at about 1:20 PM, was extensively evaluated with no acute biochemical nor radiological abnormalities found. And at the time of this interview his daughter reports that he is essentially back to his baseline mental and physical state.   He has no prior history of seizures; and no history of fever cough or cold no chest pains or worsening shortness of breath. He is described as being prediabetic and takes metformin twice per day.  He does have chronic shortness of breath, chronic respiratory failure oxygen dependent, and tends to cough up a lot of mucus at baseline. He has a chronic abdominal pain which seems to be a part of his delusional state, it has been extensively investigated and he sometimes describes it in very bizarre ways.  Rewiew of Systems:     Past Medical History  Diagnosis  Date  . Glaucoma   . Anxiety   . GERD (gastroesophageal reflux disease)   . Hyperlipidemia   . Hypertension   . Chronic abdominal pain   . Mesothelioma   . 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 Chronic respiratory failure     Past Surgical History  Procedure Date  . Hernia repair   . Prostate surgery   . Right eye laser surgery 2005  . Left eye surgery for glaucoma at [facility] 10/2009    Medications:  HOME MEDS: Prior to Admission medications   Medication Sig Start Date End Date Taking? Authorizing Provider  acetaZOLAMIDE (DIAMOX) 250 MG tablet Take 250 mg by mouth 4 (four) times daily -  with meals and at bedtime. Take one tablet by mouth with meals and at bedtime   Yes Historical Provider, MD  albuterol (PROVENTIL) (2.5 MG/3ML) 0.083% nebulizer solution Take 2.5 mg by nebulization 4 (four) times daily.   Yes Historical Provider, MD  amLODipine (NORVASC) 5 MG tablet Take 5 mg by mouth daily.     Yes Historical Provider, MD  brimonidine (ALPHAGAN P) 0.1 % SOLN Place 1 drop into both eyes at bedtime. One drop in both eyes at bedtime   Yes Historical Provider, MD  clonazePAM (KLONOPIN) 1 MG tablet Take 0.5-1 mg by mouth 3 (three) times daily. 1/2 tab in the am and 1/2 in the PM and 1 at bedtime 02/13/12  Yes Kerri Perches, MD  Docusate Sodium 100 MG capsule Take  100 mg by mouth 2 (two) times daily.     Yes Historical Provider, MD  Dorzolamide HCl-Timolol Mal PF 22.3-6.8 MG/ML SOLN Apply 1 drop to eye 2 (two) times daily.    Yes Historical Provider, MD  hydrOXYzine (ATARAX) 25 MG tablet Take 25 mg by mouth at bedtime.     Yes Historical Provider, MD  latanoprost (XALATAN) 0.005 % ophthalmic solution Place 1 drop into the left eye at bedtime.    Yes Historical Provider, MD  lovastatin (MEVACOR) 40 MG tablet Take 40 mg by mouth at bedtime.     Yes Historical Provider, MD  metFORMIN (GLUCOPHAGE) 500 MG tablet Take 1 tablet (500 mg total) by mouth 2 (two) times daily  with a meal. 04/16/12 04/16/13 Yes Kerri Perches, MD  montelukast (SINGULAIR) 10 MG tablet Take 10 mg by mouth at bedtime.     Yes Historical Provider, MD  omeprazole (PRILOSEC) 20 MG capsule Take 20 mg by mouth daily.   Yes Historical Provider, MD  oxybutynin (DITROPAN-XL) 5 MG 24 hr tablet Take 5 mg by mouth daily.     Yes Historical Provider, MD  polyethylene glycol powder (GLYCOLAX/MIRALAX) powder MIX 17 GRAMS IN 8 OUNCES OF WATER AND DRINK ONCE DAILY. 04/29/12  Yes Kerri Perches, MD  risperiDONE (RISPERDAL) 0.25 MG tablet Take 0.25 mg by mouth at bedtime.     Yes Historical Provider, MD  Rivastigmine (EXELON) 13.3 MG/24HR PT24 Place 1 patch onto the skin daily. 04/16/12  Yes Kerri Perches, MD  Vitamin D, Ergocalciferol, (DRISDOL) 50000 UNITS CAPS Take 50,000 Units by mouth every 7 (seven) days.   Yes Historical Provider, MD     Allergies:  No Known Allergies  Social History:   reports that he has quit smoking. He does not have any smokeless tobacco history on file. He reports that he does not drink alcohol or use illicit drugs.  Family History: Family History  Problem Relation Age of Onset  . Lung cancer Sister      Physical Exam: Filed Vitals:   06/12/12 1308 06/12/12 1330 06/12/12 1759 06/12/12 2029  BP: 135/67 98/68 105/68 109/69  Pulse: 79 79 74 70  Temp: 98.2 F (36.8 C)  97.1 F (36.2 C) 97.9 F (36.6 C)  TempSrc:    Oral  Resp: 15 19 18 18   SpO2: 88% 92% 95% 95%   Blood pressure 109/69, pulse 70, temperature 97.9 F (36.6 C), temperature source Oral, resp. rate 18, SpO2 95.00%.  GEN:  Pleasant African American gentleman reclining in the bed in no acute distress; cooperative with exam PSYCH:  alert and oriented; does not appear anxious or depressed; affect is appropriate. HEENT: Mucous membranes pink, dry, and anicteric; PERRLA; EOM intact;  thyromegaly or carotid bruit; no JVD; Breasts:: Not examined CHEST WALL: No tenderness CHEST: Normal respiration,  occasional expiratory wheezes; no crackles; HEART: Regular rate and rhythm; no murmurs rubs or gallops BACK: Mild kyphosis; no CVA tenderness ABDOMEN: Obese, soft non-tender; no masses, no organomegaly, normal abdominal bowel sounds;  no intertriginous candida. Rectal Exam: Not done EXTREMITIES: ; age-appropriate arthropathy of the hands and knees; no edema; no ulcerations. Genitalia: not examined PULSES: 2+ and symmetric SKIN: Normal hydration no rash or ulceration CNS: Cranial nerves 2-12 grossly intact no focal lateralizing neurologic deficit   Labs on Admission:  Basic Metabolic Panel:  Lab 06/12/12 5621  NA 138  K 3.8  CL 107  CO2 24  GLUCOSE 96  BUN 18  CREATININE 1.23  CALCIUM 10.2  MG 1.9  PHOS 3.4   Liver Function Tests: No results found for this basename: AST:5,ALT:5,ALKPHOS:5,BILITOT:5,PROT:5,ALBUMIN:5 in the last 168 hours No results found for this basename: LIPASE:5,AMYLASE:5 in the last 168 hours No results found for this basename: AMMONIA:5 in the last 168 hours CBC:  Lab 06/12/12 1453  WBC 7.4  NEUTROABS 4.5  HGB 10.6*  HCT 36.2*  MCV 91.4  PLT 161   Cardiac Enzymes:  Lab 06/12/12 1453  CKTOTAL --  CKMB --  CKMBINDEX --  TROPONINI <0.30   BNP: No components found with this basename: POCBNP:5 D-dimer: No components found with this basename: D-DIMER:5 CBG: No results found for this basename: GLUCAP:5 in the last 168 hours  Radiological Exams on Admission: Dg Chest 1 View  06/12/2012  *RADIOLOGY REPORT*  Clinical Data: Chest pain.  CHEST - 1 VIEW  Comparison: Feb 07, 2012.  Findings: Cardiomediastinal silhouette appears normal.  Right lung is clear.  Multiple rounded densities are again noted over the left hemithorax which are unchanged.  Prior exam and most consistent with pleural plaques as previously described.  Bony thorax is intact.  IMPRESSION: No change involving the left hemithorax pleural plaques compared to prior exam.  No acute  cardiopulmonary abnormality seen.   Original Report Authenticated By: Venita Sheffield., M.D.    Ct Head Wo Contrast  06/12/2012  *RADIOLOGY REPORT*  Clinical Data: Altered mental status.  CT HEAD WITHOUT CONTRAST  Technique:  Contiguous axial images were obtained from the base of the skull through the vertex without contrast.  Comparison: None.  Findings: Bony calvarium is intact.  Mild diffuse cortical atrophy is noted.  No mass effect or midline shift is noted.  Ventricular size is within normal limits.  There is no evidence of mass, hemorrhage or acute infarction.  IMPRESSION: Mild diffuse cortical atrophy.  No acute intracranial abnormality seen.   Original Report Authenticated By: Venita Sheffield., M.D.     EKG: Independently reviewed. 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   Prolonged state of confusion is not strongly suggestive of cardiac arrhythmia, but will monitor him on telemetry overnight.   He does not have tachycardia nor worsening hypoxia which would be suspicious for pulmonary embolus, but if symptoms should recur, we'll check d-dimer and if necessary proceed to a CT angiogram. Will not check d-dimer and now since high likelihood it will be elevated. . Other plans as per orders.  Code Status: DNR  Family Communication: Daughter [name] [name] [phone]) [phone]) 7 other children, 6 live locally.  Disposition Plan:    [name] [name] Nocturnist [facility] Pager [phone]   06/12/2012, 10:03 PM

## 2012-06-12 NOTE — ED Notes (Signed)
Staff from [facility] reported could not arouse patient before lunch today.  CBG 94 HR 84, BP 100/60 at facility, SpO2 92%.

## 2012-06-13 ENCOUNTER — Encounter (HOSPITAL_COMMUNITY): Payer: Self-pay | Admitting: Internal Medicine

## 2012-06-13 ENCOUNTER — Observation Stay (HOSPITAL_COMMUNITY)
Admit: 2012-06-13 | Discharge: 2012-06-13 | Disposition: A | Discharge status: Home / Self Care | Payer: PRIVATE HEALTH INSURANCE | Attending: Internal Medicine | Admitting: Internal Medicine

## 2012-06-13 ENCOUNTER — Observation Stay (HOSPITAL_COMMUNITY): Payer: PRIVATE HEALTH INSURANCE

## 2012-06-13 DIAGNOSIS — D649 Anemia, unspecified: Secondary | ICD-10-CM

## 2012-06-13 DIAGNOSIS — E119 Type 2 diabetes mellitus without complications: Secondary | ICD-10-CM

## 2012-06-13 DIAGNOSIS — F0392 Unspecified dementia, unspecified severity, with psychotic disturbance: Secondary | ICD-10-CM

## 2012-06-13 DIAGNOSIS — F22 Delusional disorders: Secondary | ICD-10-CM

## 2012-06-13 DIAGNOSIS — R001 Bradycardia, unspecified: Secondary | ICD-10-CM | POA: Diagnosis present

## 2012-06-13 DIAGNOSIS — I498 Other specified cardiac arrhythmias: Secondary | ICD-10-CM

## 2012-06-13 HISTORY — DX: Type 2 diabetes mellitus without complications: E11.9

## 2012-06-13 LAB — GLUCOSE, CAPILLARY
Glucose-Capillary: 90 mg/dL (ref 70–99)
Glucose-Capillary: 98 mg/dL (ref 70–99)

## 2012-06-13 LAB — COMPREHENSIVE METABOLIC PANEL
ALT: 8 U/L (ref 0–53)
CO2: 25 mEq/L (ref 19–32)
Calcium: 9.8 mg/dL (ref 8.4–10.5)
Creatinine, Ser: 1.13 mg/dL (ref 0.50–1.35)
GFR calc Af Amer: 69 mL/min — ABNORMAL LOW (ref >90)
GFR calc non Af Amer: 60 mL/min — ABNORMAL LOW (ref >90)
Glucose, Bld: 83 mg/dL (ref 70–99)
Sodium: 140 mEq/L (ref 135–145)
Total Protein: 7.1 g/dL (ref 6.0–8.3)

## 2012-06-13 LAB — URINE CULTURE

## 2012-06-13 LAB — HEMOGLOBIN A1C
Hgb A1c MFr Bld: 6.2 % — ABNORMAL HIGH (ref <5.7)
Mean Plasma Glucose: 131 mg/dL — ABNORMAL HIGH (ref <117)

## 2012-06-13 LAB — CBC
Hemoglobin: 10.4 g/dL — ABNORMAL LOW (ref 13.0–17.0)
MCH: 26.6 pg (ref 26.0–34.0)
MCHC: 29.3 g/dL — ABNORMAL LOW (ref 30.0–36.0)
MCV: 90.8 fL (ref 78.0–100.0)

## 2012-06-13 LAB — CK: Total CK: 71 U/L (ref 7–232)

## 2012-06-13 MED ORDER — POLYETHYLENE GLYCOL 3350 17 G PO PACK
17.0000 g | PACK | Freq: Every day | ORAL | Status: DC
  Administered 2012-06-13 – 2012-06-14 (×2): 17 g via ORAL
  Filled 2012-06-13 (×2): qty 1

## 2012-06-13 MED ORDER — CLONAZEPAM 0.5 MG PO TABS: 0.2500 mg | ORAL_TABLET | Freq: Two times a day (BID) | ORAL | Status: DC | PRN

## 2012-06-13 MED ORDER — SENNOSIDES-DOCUSATE SODIUM 8.6-50 MG PO TABS
1.0000 | ORAL_TABLET | Freq: Two times a day (BID) | ORAL | Status: DC
  Administered 2012-06-13 – 2012-06-14 (×2): 1 via ORAL
  Filled 2012-06-13 (×2): qty 1

## 2012-06-13 MED ORDER — RIVASTIGMINE 4.6 MG/24HR TD PT24
13.3000 mg | MEDICATED_PATCH | Freq: Every day | TRANSDERMAL | Status: DC
  Administered 2012-06-13 – 2012-06-14 (×2): 13.8 mg via TRANSDERMAL
  Filled 2012-06-13 (×4): qty 3

## 2012-06-13 MED ORDER — RIVASTIGMINE 4.6 MG/24HR TD PT24: 13.3000 mg | MEDICATED_PATCH | Freq: Every day | TRANSDERMAL | Status: DC

## 2012-06-13 MED ORDER — RIVASTIGMINE 13.3 MG/24HR TD PT24
13.3000 mg | MEDICATED_PATCH | Freq: Every day | TRANSDERMAL | Status: DC
  Filled 2012-06-13 (×4): qty 1

## 2012-06-13 NOTE — Progress Notes (Signed)
EEG completed at bedside 

## 2012-06-13 NOTE — Procedures (Signed)
[facility] Essa Wenk A. Gerilyn Pilgrim, MD     [email or web address]        FACILITY: [facility]  PHYSICIAN: Sharmon Cheramie A. Gerilyn Pilgrim, M.D.  DATE OF PROCEDURE: 06/13/2012 EEG INTERPRETATION  INDICATION: The patient is a 78 year old who presents with a spell of unresponsiveness and confusion. This does be done to evaluate for possible seizures. MEDICATIONS:  Scheduled Meds:   . acetaZOLAMIDE  250 mg Oral TID WC & HS  . amLODipine  5 mg Oral Daily  . brimonidine  1 drop Both Eyes QHS  . dorzolamide-timolol  1 drop Both Eyes BID  . enoxaparin (LOVENOX) injection  40 mg Subcutaneous Q24H  . hydrOXYzine  25 mg Oral QHS  . insulin aspart  0-5 Units Subcutaneous QHS  . insulin aspart  0-9 Units Subcutaneous TID WC  . latanoprost  1 drop Left Eye QHS  . metFORMIN  500 mg Oral BID WC  . montelukast  10 mg Oral QHS  . oxybutynin  5 mg Oral Daily  . pantoprazole  40 mg Oral Q1200  . polyethylene glycol  17 g Oral Daily  . risperiDONE  0.25 mg Oral QHS  . rivastigmine  13.8 mg Transdermal Daily  . senna-docusate  1 tablet Oral BID  . simvastatin  20 mg Oral QHS  . sodium chloride  3 mL Intravenous Q12H  . DISCONTD: docusate sodium  100 mg Oral BID  . DISCONTD: Dorzolamide HCl-Timolol Mal PF  1 drop Ophthalmic BID  . DISCONTD: rivastigmine  13.8 mg Transdermal Daily  . DISCONTD: Rivastigmine  1 patch Transdermal Daily  . DISCONTD: Rivastigmine  13.3 mg Transdermal Daily   Continuous Infusions:   . 0.9 % NaCl with KCl 20 mEq / L 50 mL/hr at 06/13/12 1139   PRN Meds:.acetaminophen, albuterol, bisacodyl, clonazePAM, ipratropium, ondansetron (ZOFRAN) IV, sodium phosphate, traZODone, DISCONTD: clonazepam, DISCONTD: ondansetron (ZOFRAN) IV   ANALYSIS: A 16-channel recording using standard 10/20 measurements is conducted for 20 minutes. There is a well-formed posterior dominant rhythm of 10-1/2 Hz which attenuates without opening. There is beta activity observed in the frontal areas. Awake and drowsy  activities are recorded. Hyperventilation was not carried out but photic stimulation is done. There is no significant changes with photic stimulation. There is no focal or lateralized slowing. There is no epileptiform activity observed.  IMPRESSION: Normal recording of the awake and drowsy states.   Jowanna Loeffler A. Gerilyn Pilgrim, M.D. Diplomate, Biomedical engineer of Psychiatry and Neurology ( Neurology).

## 2012-06-13 NOTE — Progress Notes (Addendum)
Subjective: The patient is lying in bed. He is hard of hearing. He denies headache or dizziness at rest. He denies chest pain. When asked about bowel movements, he does acknowledge constipation. His daughters in the room. They say he is back to baseline neurologically. Questions answered.  Objective: Vital signs in last 24 hours: Filed Vitals:   06/13/12 0841 06/13/12 1002 06/13/12 1005 06/13/12 1008  BP:  113/64 133/76 120/75  Pulse: 55 78 76 95  Temp:  97.7 F (36.5 C) 97.7 F (36.5 C) 97.7 F (36.5 C)  TempSrc:  Oral Oral Oral  Resp: 24 18 18 19   Weight:      SpO2: 95% 99% 94% 98%    Intake/Output Summary (Last 24 hours) at 06/13/12 1018 Last data filed at 06/13/12 0907  Gross per 24 hour  Intake    240 ml  Output    575 ml  Net   -335 ml    Weight change:   Physical exam: General: Elderly 78 year old African-American man laying in bed, in no acute distress. Lungs: Clear to auscultation bilaterally Heart: S1, S2, with a soft systolic murmur. Abdomen: Mildly obese, protuberant, nontender, nondistended. Extremities: No pedal edema. Neurologic: He is very hard of hearing despite hearing aids. However, he is oriented to himself, hospital, and his daughters. His speech is clear. He follows directions well. He has a strong bilateral hand grip. He is able to lift each leg against gravity approximately 40. Sensation is grossly intact.  Lab Results: Basic Metabolic Panel:  Basename 06/13/12 0522 06/12/12 1453  NA 140 138  K 4.0 3.8  CL 109 107  CO2 25 24  GLUCOSE 83 96  BUN 16 18  CREATININE 1.13 1.23  CALCIUM 9.8 10.2  MG -- 1.9  PHOS -- 3.4   Liver Function Tests:  Basename 06/13/12 0522  AST 11  ALT 8  ALKPHOS 79  BILITOT 0.2*  PROT 7.1  ALBUMIN 3.4*   No results found for this basename: LIPASE:2,AMYLASE:2 in the last 72 hours No results found for this basename: AMMONIA:2 in the last 72 hours CBC:  Basename 06/13/12 0522 06/12/12 1453  WBC 6.7 7.4    NEUTROABS -- 4.5  HGB 10.4* 10.6*  HCT 35.5* 36.2*  MCV 90.8 91.4  PLT 162 161   Cardiac Enzymes:  Basename 06/13/12 0552 06/12/12 1453  CKTOTAL 71 --  CKMB -- --  CKMBINDEX -- --  TROPONINI -- <0.30   BNP: No results found for this basename: PROBNP:3 in the last 72 hours D-Dimer: No results found for this basename: DDIMER:2 in the last 72 hours CBG:  Basename 06/13/12 0734 06/12/12 2312  GLUCAP 90 146*   Hemoglobin A1C: No results found for this basename: HGBA1C in the last 72 hours Fasting Lipid Panel: No results found for this basename: CHOL,HDL,LDLCALC,TRIG,CHOLHDL,LDLDIRECT in the last 72 hours Thyroid Function Tests: No results found for this basename: TSH,T4TOTAL,FREET4,T3FREE,THYROIDAB in the last 72 hours Anemia Panel: No results found for this basename: VITAMINB12,FOLATE,FERRITIN,TIBC,IRON,RETICCTPCT in the last 72 hours Coagulation: No results found for this basename: LABPROT:2,INR:2 in the last 72 hours Urine Drug Screen: Drugs of Abuse  No results found for this basename: labopia, cocainscrnur, labbenz, amphetmu, thcu, labbarb    Alcohol Level: No results found for this basename: ETH:2 in the last 72 hours Urinalysis:  Basename 06/12/12 1554  COLORURINE YELLOW  LABSPEC 1.025  PHURINE 6.0  GLUCOSEU NEGATIVE  HGBUR NEGATIVE  BILIRUBINUR NEGATIVE  KETONESUR NEGATIVE  PROTEINUR NEGATIVE  UROBILINOGEN 0.2  NITRITE  NEGATIVE  LEUKOCYTESUR NEGATIVE   Misc. Labs:   Micro: No results found for this or any previous visit (from the past 240 hour(s)).  Studies/Results: Dg Chest 1 View  06/12/2012  *RADIOLOGY REPORT*  Clinical Data: Chest pain.  CHEST - 1 VIEW  Comparison: Feb 07, 2012.  Findings: Cardiomediastinal silhouette appears normal.  Right lung is clear.  Multiple rounded densities are again noted over the left hemithorax which are unchanged.  Prior exam and most consistent with pleural plaques as previously described.  Bony thorax is intact.   IMPRESSION: No change involving the left hemithorax pleural plaques compared to prior exam.  No acute cardiopulmonary abnormality seen.   Original Report Authenticated By: Venita Sheffield., M.D.    Ct Head Wo Contrast  06/12/2012  *RADIOLOGY REPORT*  Clinical Data: Altered mental status.  CT HEAD WITHOUT CONTRAST  Technique:  Contiguous axial images were obtained from the base of the skull through the vertex without contrast.  Comparison: None.  Findings: Bony calvarium is intact.  Mild diffuse cortical atrophy is noted.  No mass effect or midline shift is noted.  Ventricular size is within normal limits.  There is no evidence of mass, hemorrhage or acute infarction.  IMPRESSION: Mild diffuse cortical atrophy.  No acute intracranial abnormality seen.   Original Report Authenticated By: Venita Sheffield., M.D.     Medications:  Scheduled:   . sodium chloride   Intravenous Once  . acetaZOLAMIDE  250 mg Oral TID WC & HS  . amLODipine  5 mg Oral Daily  . brimonidine  1 drop Both Eyes QHS  . docusate sodium  100 mg Oral BID  . dorzolamide-timolol  1 drop Both Eyes BID  . enoxaparin (LOVENOX) injection  40 mg Subcutaneous Q24H  . hydrOXYzine  25 mg Oral QHS  . insulin aspart  0-5 Units Subcutaneous QHS  . insulin aspart  0-9 Units Subcutaneous TID WC  . latanoprost  1 drop Left Eye QHS  . metFORMIN  500 mg Oral BID WC  . montelukast  10 mg Oral QHS  . oxybutynin  5 mg Oral Daily  . pantoprazole  40 mg Oral Q1200  . risperiDONE  0.25 mg Oral QHS  . rivastigmine  13.8 mg Transdermal Daily  . simvastatin  20 mg Oral QHS  . sodium chloride  3 mL Intravenous Q12H  . DISCONTD: Dorzolamide HCl-Timolol Mal PF  1 drop Ophthalmic BID  . DISCONTD: rivastigmine  13.8 mg Transdermal Daily  . DISCONTD: Rivastigmine  1 patch Transdermal Daily  . DISCONTD: Rivastigmine  13.3 mg Transdermal Daily   Continuous:   . 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 Altered mental status with unresponsiveness. Etiology is unknown. He is back to baseline neurologically. EEG is pending. Further investigational studies will be ordered.  Senile dementia with delusional features. He is currently alert and oriented. No evidence of delusions or agitation currently. We'll continue chronic medications.  Chronic anxiety. Will continue Klonopin as ordered.  Prediabetes. We'll continue metformin as previously ordered.  Bradycardia. We'll continue to monitor. We'll order a TSH/free T4 to for evaluation.  Hypertension. Currently controlled. Continue amlodipine.  Chronic respiratory failure, oxygen dependent. Currently stable.  Chronic lung nodule secondary to asbestos exposure/mesothelioma.  Chronic constipation. We'll add laxative regimen.  Plan:  1. EEG pending. We'll check the results. 2. For further evaluation, we'll order an MRI of his brain, total CK, RPR, vitamin B12 level, total iron, TIBC, TSH, and free T4. 3. Physical therapy consultation. 4. Neurology consultation.    LOS: 1 day   Shuntel Fishburn 06/13/2012, 10:18 AM

## 2012-06-13 NOTE — Clinical Social Work Psychosocial (Signed)
Clinical Social Work Department BRIEF PSYCHOSOCIAL ASSESSMENT 06/13/2012  Patient:  [name] [name], [name] [name]     Account Number:  [id number]     Admit date:  06/12/2012  Clinical Social Worker:  Nancie Neas  Date/Time:  06/13/2012 10:50 AM  Referred by:  Physician  Date Referred:  06/13/2012 Referred for  ALF Placement   Other Referral:   Interview type:  Family Other interview type:    PSYCHOSOCIAL DATA Living Status:  FACILITY Admitted from facility:  [facility] Level of care:  Assisted Living Primary support name:  Rosa Primary support relationship to patient:  CHILD, ADULT Degree of support available:   very supportive    CURRENT CONCERNS Current Concerns  Post-Acute Placement   Other Concerns:    SOCIAL WORK ASSESSMENT / PLAN CSW met with two of pt's daughters, Madison and Toniann Fail, at bedside. Pt in MRI. Family report pt has been a resident at [facility] for about 8 years. Family appear to be very involved and supportive. They state they have a very close relationship with facility and that pt is more like family to [facility]. Per Glee Arvin, supervisor at facility, pt requires extensive assist with ADLs. He can feed himself and ambulate with assist. He has 80 personal care hours. Okay for return at d/c per Latoya. No home health services prior to admission but facility uses [facility] if home health is recommended at d/c. Okay for weekend d/c if ready. MD aware   Assessment/plan status:  Psychosocial Support/Ongoing Assessment of Needs Other assessment/ plan:   Information/referral to community resources:   [facility]    PATIENT'S/FAMILY'S RESPONSE TO PLAN OF CARE: Pt unable to discuss plan of care at this time. Family report very positive feelings regarding return to [facility] when medically stable.        Derenda Fennel, [place] [phone]

## 2012-06-13 NOTE — Evaluation (Signed)
Physical Therapy Evaluation Patient Details Name: [name] [name] MRN: [id number] DOB: 1931-05-14 Today's Date: 06/13/2012 Time: 4098-1191 PT Time Calculation (min): 33 min  PT Assessment / Plan / Recommendation Clinical Impression  [name] [name] appears to by near prior level of function.  He will be seen in physical therapy to improve activity tolerance and safety of gait as well as assessing the least assistive device needed to stay safe with ambulation.    PT Assessment  Patient needs continued PT services    Follow Up Recommendations  No PT follow up    Barriers to Discharge  none      Equipment Recommendations  Rolling walker with 5" wheels    Recommendations for Other Services   none  Frequency Min 3X/week    Precautions / Restrictions Precautions Precautions: None Restrictions Weight Bearing Restrictions: No         Mobility  Bed Mobility Bed Mobility: Supine to Sit Supine to Sit: 4: Min assist Transfers Transfers: Sit to Stand Sit to Stand: 4: Min guard Ambulation/Gait Ambulation/Gait Assistance: 4: Min guard Ambulation Distance (Feet): 65 Feet Assistive device: Rolling walker Gait Pattern: Decreased step length - right;Decreased step length - left;Decreased dorsiflexion - left;Decreased dorsiflexion - right;Trunk flexed Gait velocity: slow but this is normal for pt Wheelchair Mobility Wheelchair Mobility: No           PT Diagnosis: Difficulty walking  PT Problem List: Decreased activity tolerance;Decreased mobility;Decreased strength PT Treatment Interventions: Gait training (attempt gt with cane next treatment to assess safety.)   PT Goals Acute Rehab PT Goals PT Goal Formulation: With patient Time For Goal Achievement: 06/17/12 Potential to Achieve Goals: Good [name] Supine/Side to Sit: with min assist PT Goal: Supine/Side to Sit - Progress: Goal set today [name] Sit to Stand: with supervision PT Goal: Sit to Stand - Progress: Goal  set today Pt will Ambulate: 51 - 150 feet;with supervision;with cane PT Goal: Ambulate - Progress: Goal set today  Visit Information  Last PT Received On: 06/13/12    Subjective Data  Subjective: Pt states that he is feeling  much better than yesterday. Patient Stated Goal: none stated   Prior Functioning  Home Living Lives With:  (group home) Available Help at Discharge: Available 24 hours/day Type of Home: House Home Access: Level entry Home Layout: One level Bathroom Shower/Tub: Banker: Straight cane Prior Function Level of Independence: Needs assistance Able to Take Stairs?: No Driving: No Vocation: Retired Musician: HOH Dominant Hand: Right    Cognition  Overall Cognitive Status: Appears within functional limits for tasks assessed/performed Arousal/Alertness: Awake/alert Orientation Level: Appears intact for tasks assessed Behavior During Session: [facility] for tasks performed    Extremity/Trunk Assessment Right Lower Extremity Assessment RLE ROM/Strength/Tone: [facility] for tasks assessed Left Lower Extremity Assessment LLE ROM/Strength/Tone: [facility] for tasks assessed   Balance Balance Balance Assessed: No  End of Session PT - End of Session Equipment Utilized During Treatment: Gait belt Activity Tolerance: Patient tolerated treatment well Patient left: in chair;with call bell/phone within reach;with family/visitor present Nurse Communication: Mobility status  GP     Clemens Lachman,CINDY 06/13/2012, 11:56 AM

## 2012-06-13 NOTE — Progress Notes (Signed)
Occupational Therapy Screen  OT order received. Patient's chart reviewed. Spoke with patient and daughter about patient's functional performance during daily tasks. Patient receives assistance for all BADL at a group home setting. Daughter states that patient is functioning at his baseline and does not have any new issues. I don't see any reason why patient cannot return to group home setting. Patient does not require OT services at this time; will sign off.   Limmie Patricia, OTR/L 06/13/12 2:05PM

## 2012-06-13 NOTE — Progress Notes (Signed)
UR Chart Review Completed  

## 2012-06-13 NOTE — Consult Note (Signed)
[facility] Mathan Darroch A. Gerilyn Pilgrim, MD     [email or web address]        Reason for Consult: Referring Physician:   MACGUIRE HOLSINGER is an 78 y.o. male.  HPI:   Past Medical History  Diagnosis Date  . Glaucoma   . Anxiety   . GERD (gastroesophageal reflux disease)   . Hyperlipidemia   . Hypertension   . Chronic abdominal pain   . Mesothelioma   . 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 Chronic respiratory failure   . DM type 2 (diabetes mellitus, type 2) 06/13/2012    Past Surgical History  Procedure Date  . Hernia repair   . Prostate surgery   . Right eye laser surgery 2005  . Left eye surgery for glaucoma at [facility] 10/2009    Family History  Problem Relation Age of Onset  . Lung cancer Sister     Social History:  reports that he has quit smoking. He does not have any smokeless tobacco history on file. He reports that he does not drink alcohol or use illicit drugs.  Allergies: No Known Allergies  Medications:  Prior to Admission medications   Medication Sig Start Date End Date Taking? Authorizing Provider  acetaZOLAMIDE (DIAMOX) 250 MG tablet Take 250 mg by mouth 4 (four) times daily -  with meals and at bedtime. Take one tablet by mouth with meals and at bedtime   Yes Historical Provider, MD  albuterol (PROVENTIL) (2.5 MG/3ML) 0.083% nebulizer solution Take 2.5 mg by nebulization 4 (four) times daily.   Yes Historical Provider, MD  amLODipine (NORVASC) 5 MG tablet Take 5 mg by mouth daily.     Yes Historical Provider, MD  brimonidine (ALPHAGAN P) 0.1 % SOLN Place 1 drop into both eyes at bedtime. One drop in both eyes at bedtime   Yes Historical Provider, MD  clonazePAM (KLONOPIN) 1 MG tablet Take 0.5-1 mg by mouth 3 (three) times daily. 1/2 tab in the am and 1/2 in the PM and 1 at bedtime 02/13/12  Yes Kerri Perches, MD  Docusate Sodium 100 MG capsule Take 100 mg by mouth 2 (two) times daily.     Yes Historical Provider, MD  Dorzolamide  HCl-Timolol Mal PF 22.3-6.8 MG/ML SOLN Apply 1 drop to eye 2 (two) times daily.    Yes Historical Provider, MD  hydrOXYzine (ATARAX) 25 MG tablet Take 25 mg by mouth at bedtime.     Yes Historical Provider, MD  latanoprost (XALATAN) 0.005 % ophthalmic solution Place 1 drop into the left eye at bedtime.    Yes Historical Provider, MD  lovastatin (MEVACOR) 40 MG tablet Take 40 mg by mouth at bedtime.     Yes Historical Provider, MD  metFORMIN (GLUCOPHAGE) 500 MG tablet Take 1 tablet (500 mg total) by mouth 2 (two) times daily with a meal. 04/16/12 04/16/13 Yes Kerri Perches, MD  montelukast (SINGULAIR) 10 MG tablet Take 10 mg by mouth at bedtime.     Yes Historical Provider, MD  omeprazole (PRILOSEC) 20 MG capsule Take 20 mg by mouth daily.   Yes Historical Provider, MD  oxybutynin (DITROPAN-XL) 5 MG 24 hr tablet Take 5 mg by mouth daily.     Yes Historical Provider, MD  polyethylene glycol powder (GLYCOLAX/MIRALAX) powder MIX 17 GRAMS IN 8 OUNCES OF WATER AND DRINK ONCE DAILY. 04/29/12  Yes Kerri Perches, MD  risperiDONE (RISPERDAL) 0.25 MG tablet Take 0.25 mg by mouth at bedtime.  Yes Historical Provider, MD  Rivastigmine (EXELON) 13.3 MG/24HR PT24 Place 1 patch onto the skin daily. 04/16/12  Yes Kerri Perches, MD  Vitamin D, Ergocalciferol, (DRISDOL) 50000 UNITS CAPS Take 50,000 Units by mouth every 7 (seven) days.   Yes Historical Provider, MD    Scheduled Meds:   . acetaZOLAMIDE  250 mg Oral TID WC & HS  . amLODipine  5 mg Oral Daily  . brimonidine  1 drop Both Eyes QHS  . dorzolamide-timolol  1 drop Both Eyes BID  . enoxaparin (LOVENOX) injection  40 mg Subcutaneous Q24H  . hydrOXYzine  25 mg Oral QHS  . insulin aspart  0-5 Units Subcutaneous QHS  . insulin aspart  0-9 Units Subcutaneous TID WC  . latanoprost  1 drop Left Eye QHS  . metFORMIN  500 mg Oral BID WC  . montelukast  10 mg Oral QHS  . oxybutynin  5 mg Oral Daily  . pantoprazole  40 mg Oral Q1200  . polyethylene  glycol  17 g Oral Daily  . risperiDONE  0.25 mg Oral QHS  . rivastigmine  13.8 mg Transdermal Daily  . senna-docusate  1 tablet Oral BID  . simvastatin  20 mg Oral QHS  . sodium chloride  3 mL Intravenous Q12H  . DISCONTD: docusate sodium  100 mg Oral BID  . DISCONTD: Dorzolamide HCl-Timolol Mal PF  1 drop Ophthalmic BID  . DISCONTD: rivastigmine  13.8 mg Transdermal Daily  . DISCONTD: Rivastigmine  1 patch Transdermal Daily  . DISCONTD: Rivastigmine  13.3 mg Transdermal Daily   Continuous Infusions:   . 0.9 % NaCl with KCl 20 mEq / L 50 mL/hr at 06/13/12 1139   PRN Meds:.acetaminophen, albuterol, bisacodyl, clonazePAM, ipratropium, ondansetron (ZOFRAN) IV, sodium phosphate, traZODone, DISCONTD: clonazepam, DISCONTD: ondansetron (ZOFRAN) IV   Results for orders placed during the hospital encounter of 06/12/12 (from the past 48 hour(s))  BASIC METABOLIC PANEL     Status: Abnormal   Collection Time   06/12/12  2:53 PM      Component Value Range Comment   Sodium 138  135 - 145 mEq/L    Potassium 3.8  3.5 - 5.1 mEq/L    Chloride 107  96 - 112 mEq/L    CO2 24  19 - 32 mEq/L    Glucose, Bld 96  70 - 99 mg/dL    BUN 18  6 - 23 mg/dL    Creatinine, Ser 6.29  0.50 - 1.35 mg/dL    Calcium 52.8  8.4 - 10.5 mg/dL    GFR calc non Af Amer 54 (*) >90 mL/min    GFR calc Af Amer 63 (*) >90 mL/min   CBC WITH DIFFERENTIAL     Status: Abnormal   Collection Time   06/12/12  2:53 PM      Component Value Range Comment   WBC 7.4  4.0 - 10.5 K/uL    RBC 3.96 (*) 4.22 - 5.81 MIL/uL    Hemoglobin 10.6 (*) 13.0 - 17.0 g/dL    HCT 41.3 (*) 24.4 - 52.0 %    MCV 91.4  78.0 - 100.0 fL    MCH 26.8  26.0 - 34.0 pg    MCHC 29.3 (*) 30.0 - 36.0 g/dL    RDW 01.0  27.2 - 53.6 %    Platelets 161  150 - 400 K/uL    Neutrophils Relative 60  43 - 77 %    Neutro Abs 4.5  1.7 - 7.7  K/uL    Lymphocytes Relative 29  12 - 46 %    Lymphs Abs 2.1  0.7 - 4.0 K/uL    Monocytes Relative 8  3 - 12 %    Monocytes  Absolute 0.6  0.1 - 1.0 K/uL    Eosinophils Relative 3  0 - 5 %    Eosinophils Absolute 0.2  0.0 - 0.7 K/uL    Basophils Relative 0  0 - 1 %    Basophils Absolute 0.0  0.0 - 0.1 K/uL   TROPONIN I     Status: Normal   Collection Time   06/12/12  2:53 PM      Component Value Range Comment   Troponin I <0.30  <0.30 ng/mL   MAGNESIUM     Status: Normal   Collection Time   06/12/12  2:53 PM      Component Value Range Comment   Magnesium 1.9  1.5 - 2.5 mg/dL   PHOSPHORUS     Status: Normal   Collection Time   06/12/12  2:53 PM      Component Value Range Comment   Phosphorus 3.4  2.3 - 4.6 mg/dL   URINALYSIS, ROUTINE W REFLEX MICROSCOPIC     Status: Normal   Collection Time   06/12/12  3:54 PM      Component Value Range Comment   Color, Urine YELLOW  YELLOW    APPearance CLEAR  CLEAR    Specific Gravity, Urine 1.025  1.005 - 1.030    pH 6.0  5.0 - 8.0    Glucose, UA NEGATIVE  NEGATIVE mg/dL    Hgb urine dipstick NEGATIVE  NEGATIVE    Bilirubin Urine NEGATIVE  NEGATIVE    Ketones, ur NEGATIVE  NEGATIVE mg/dL    Protein, ur NEGATIVE  NEGATIVE mg/dL    Urobilinogen, UA 0.2  0.0 - 1.0 mg/dL    Nitrite NEGATIVE  NEGATIVE    Leukocytes, UA NEGATIVE  NEGATIVE MICROSCOPIC NOT DONE ON URINES WITH NEGATIVE PROTEIN, BLOOD, LEUKOCYTES, NITRITE, OR GLUCOSE <1000 mg/dL.  TSH     Status: Normal   Collection Time   06/12/12  9:37 PM      Component Value Range Comment   TSH 1.494  0.350 - 4.500 uIU/mL   HEMOGLOBIN A1C     Status: Abnormal   Collection Time   06/12/12  9:37 PM      Component Value Range Comment   Hemoglobin A1C 6.2 (*) <5.7 %    Mean Plasma Glucose 131 (*) <117 mg/dL   GLUCOSE, CAPILLARY     Status: Abnormal   Collection Time   06/12/12 11:12 PM      Component Value Range Comment   Glucose-Capillary 146 (*) 70 - 99 mg/dL   CBC     Status: Abnormal   Collection Time   06/13/12  5:22 AM      Component Value Range Comment   WBC 6.7  4.0 - 10.5 K/uL    RBC 3.91 (*) 4.22 - 5.81  MIL/uL    Hemoglobin 10.4 (*) 13.0 - 17.0 g/dL    HCT 04.5 (*) 40.9 - 52.0 %    MCV 90.8  78.0 - 100.0 fL    MCH 26.6  26.0 - 34.0 pg    MCHC 29.3 (*) 30.0 - 36.0 g/dL    RDW 81.1  91.4 - 78.2 %    Platelets 162  150 - 400 K/uL   COMPREHENSIVE METABOLIC PANEL     Status:  Abnormal   Collection Time   06/13/12  5:22 AM      Component Value Range Comment   Sodium 140  135 - 145 mEq/L    Potassium 4.0  3.5 - 5.1 mEq/L    Chloride 109  96 - 112 mEq/L    CO2 25  19 - 32 mEq/L    Glucose, Bld 83  70 - 99 mg/dL    BUN 16  6 - 23 mg/dL    Creatinine, Ser 1.61  0.50 - 1.35 mg/dL    Calcium 9.8  8.4 - 09.6 mg/dL    Total Protein 7.1  6.0 - 8.3 g/dL    Albumin 3.4 (*) 3.5 - 5.2 g/dL    AST 11  0 - 37 U/L    ALT 8  0 - 53 U/L    Alkaline Phosphatase 79  39 - 117 U/L    Total Bilirubin 0.2 (*) 0.3 - 1.2 mg/dL    GFR calc non Af Amer 60 (*) >90 mL/min    GFR calc Af Amer 69 (*) >90 mL/min   CK     Status: Normal   Collection Time   06/13/12  5:52 AM      Component Value Range Comment   Total CK 71  7 - 232 U/L   GLUCOSE, CAPILLARY     Status: Normal   Collection Time   06/13/12  7:34 AM      Component Value Range Comment   Glucose-Capillary 90  70 - 99 mg/dL   GLUCOSE, CAPILLARY     Status: Normal   Collection Time   06/13/12 11:31 AM      Component Value Range Comment   Glucose-Capillary 98  70 - 99 mg/dL   GLUCOSE, CAPILLARY     Status: Abnormal   Collection Time   06/13/12  3:56 PM      Component Value Range Comment   Glucose-Capillary 104 (*) 70 - 99 mg/dL     Dg Chest 1 View  12/13/4096  *RADIOLOGY REPORT*  Clinical Data: Chest pain.  CHEST - 1 VIEW  Comparison: Feb 07, 2012.  Findings: Cardiomediastinal silhouette appears normal.  Right lung is clear.  Multiple rounded densities are again noted over the left hemithorax which are unchanged.  Prior exam and most consistent with pleural plaques as previously described.  Bony thorax is intact.  IMPRESSION: No change involving the  left hemithorax pleural plaques compared to prior exam.  No acute cardiopulmonary abnormality seen.   Original Report Authenticated By: Venita Sheffield., M.D.    Ct Head Wo Contrast  06/12/2012  *RADIOLOGY REPORT*  Clinical Data: Altered mental status.  CT HEAD WITHOUT CONTRAST  Technique:  Contiguous axial images were obtained from the base of the skull through the vertex without contrast.  Comparison: None.  Findings: Bony calvarium is intact.  Mild diffuse cortical atrophy is noted.  No mass effect or midline shift is noted.  Ventricular size is within normal limits.  There is no evidence of mass, hemorrhage or acute infarction.  IMPRESSION: Mild diffuse cortical atrophy.  No acute intracranial abnormality seen.   Original Report Authenticated By: Venita Sheffield., M.D.    Mr Brain Wo Contrast  06/13/2012  *RADIOLOGY REPORT*  Clinical Data: Altered mental status.  Weakness.  Shaking.  MRI HEAD WITHOUT CONTRAST  Technique:  Multiplanar, multiecho pulse sequences of the brain and surrounding structures were obtained according to standard protocol without intravenous contrast.  Comparison: Head  CT 06/12/2012.  Findings: Diffusion imaging is negative for acute or subacute infarction.  There are mild chronic small vessel changes within the pons.  No cerebellar insult.  The cerebral hemispheres show moderate chronic small vessel disease affecting the deep and subcortical white matter.  No cortical or large vessel territory infarction.  No mass lesion, hemorrhage, hydrocephalus or extra- axial collection.  No pituitary mass.  No inflammatory sinus disease.  No skull or skull base lesion.  IMPRESSION: No acute finding.  Ordinary moderate chronic small vessel change affecting the pons and cerebral hemispheric white matter, fairly typical for age.   Original Report Authenticated By: Thomasenia Sales, M.D.     MRI BRAIN Clinical Data: Altered mental status. Weakness. Shaking.  MRI HEAD WITHOUT CONTRAST    Technique: Multiplanar, multiecho pulse sequences of the brain and  surrounding structures were obtained according to standard protocol  without intravenous contrast.  Comparison: Head CT 06/12/2012.  Findings: Diffusion imaging is negative for acute or subacute  infarction. There are mild chronic small vessel changes within the  pons. No cerebellar insult. The cerebral hemispheres show  moderate chronic small vessel disease affecting the deep and  subcortical white matter. No cortical or large vessel territory  infarction. No mass lesion, hemorrhage, hydrocephalus or extra-  axial collection. No pituitary mass. No inflammatory sinus  disease. No skull or skull base lesion.  IMPRESSION:  No acute finding. Ordinary moderate chronic small vessel change  affecting the pons and cerebral hemispheric white matter, fairly  typical for age.   Review of Systems  Constitutional: Negative.   Eyes: Negative.   Respiratory: Positive for shortness of breath.   Cardiovascular: Negative.   Gastrointestinal: Negative.   Genitourinary: Negative.   Skin: Negative.    Blood pressure 110/66, pulse 56, temperature 97.6 F (36.4 C), temperature source Oral, resp. rate 18, weight 82.781 kg (182 lb 8 oz), SpO2 96.00%. Physical Exam  ASSESSMENT/PLAN:  1. Acute onset of altered mental status that resolved spontaneously with no clear underlying causes detected. The semiology seems most consistent with a unwitnessed seizure with spontaneous improvement. The patient is on several psychotropic medication but doses are not very high and seems unlikely to explain his symptoms. Again, there is no evidence of stroke or metabolic derangement expected the patient's symptoms. Given the patient's EEG is normal, I would not suggest treating the patient empirically with seizure medications. 2. Likely mild cognitive impairment/mild dementia. I suspect that the most likely explanation is vascular dementia at the posterior  Alzheimer's dementia. The patient's MRI findings and clinical symptoms seems most consistent with a vascular dementia type picture. He is on Exelon patch which seems appropriate. The patient has an extensive laboratory evaluation for treatable causes of dementia such as thyroid function tests and RPR. This should be followed up.   SUBJECTIVE: The patient is a 78 year old male who was found unresponsive on yesterday. The patient remained essentially unresponsive and not his baseline self throughout the day. That afternoon the patient's family decided to seek medical attention. The patient apparently does have some cognitive impairment at baseline. I did question the repeatedly about memory impairment and they specifically deny short-term memory impairment. He recognizes family members well and also seemed to have good long-term memory. It appears that he mostly had been having significant paranoia about his body such as thinking his stomach is moving from one side to other and that his throat is also moving. He seemed to have mostly somatic type of paranoia. Family reports  that he usually is conversant and lucid at baseline. He does have difficulties moving around especially since his been diagnosed with asbestos lung disease. He is on 24-hour oxygen. It appears that the patient's cognition improved overnight and he is now at baseline.  OBJECTIVE: GENERAL: The patient is in no acute distress.  HEENT: Retro-palatal space is fine. He is markedly hearing impaired.  ABDOMEN: soft  EXTREMITIES: No edema   BACK: Unremarkable.  SKIN: Normal by inspection.    MENTAL STATUS: The patient is sleeping but easily arousable. He is oriented to person and place. He knows that he is in the hospital at [facility]. Follow commands briskly on both sides. He is essentially mostly lucid and coherent this afternoon.  CRANIAL NERVES: Pupils are equal, round and reactive to light and accomodation; extra ocular movements  are full, there is no significant nystagmus; visual fields are full; upper and lower facial muscles are normal in strength and symmetric, there is no flattening of the nasolabial folds; tongue is midline; uvula is midline; shoulder elevation is normal.  MOTOR: Normal tone and bulk with strength being at least 4+ throughout the upper and lower extremities.  COORDINATION: Left finger to nose is normal, right finger to nose is normal, No rest tremor; no intention tremor; no postural tremor; no bradykinesia. No cogwheeling is noted.  REFLEXES: Deep tendon reflexes are symmetrical and normal. Babinski reflexes are flexor bilaterally.   SENSATION: Normal to light touch and pain.     Beryle Beams, MD Diplomate, American Board of Psychiatry and Neurology (Neurology).   Rolinda Impson 06/13/2012, 4:20 PM

## 2012-06-13 NOTE — Care Management Note (Unsigned)
    Page 1 of 2   06/13/2012     3:12:04 PM   CARE MANAGEMENT NOTE 06/13/2012  Patient:  [name] [name], [name] [name]   Account Number:  [id number]  Date Initiated:  06/13/2012  Documentation initiated by:  Rosemary Holms  Subjective/Objective Assessment:   Pt admitted from [facility] unresponsive. Met with family members and pt at bedside. Pt hard of hearing. Family request hospital bed (notified that he was not eligible under Medicare guidelines) rolling walker (in room) and HH     Action/Plan:   Rolling Walker in room for DC. HH RN and PT requested from family   Anticipated DC Date:  06/14/2012   Anticipated DC Plan:  [facility]IV[facility] / REST HOME  In-house referral  Clinical Social Worker      DC Associate Professor  CM consult      Choice offered to / List presented to:     DME arranged  WALKER - Lavone Nian      DME agency  [facility]     HH arranged  HH-1 RN  HH-2 PT      [facility] agency  [facility]   Status of service:  In process, will continue to follow Medicare Important Message given?   (If response is "NO", the following Medicare IM given date fields will be blank) Date Medicare IM given:   Date Additional Medicare IM given:    Discharge Disposition:    Per UR Regulation:    If discussed at Long Length of Stay Meetings, dates discussed:    Comments:  06/13/12 1400 Karalyn Kadel Leanord Hawking RN BSN CM

## 2012-06-14 ENCOUNTER — Encounter (HOSPITAL_COMMUNITY): Payer: Self-pay | Admitting: Internal Medicine

## 2012-06-14 DIAGNOSIS — R569 Unspecified convulsions: Secondary | ICD-10-CM

## 2012-06-14 HISTORY — DX: Unspecified convulsions: R56.9

## 2012-06-14 LAB — IRON AND TIBC
Saturation Ratios: 23 % (ref 20–55)
UIBC: 186 ug/dL (ref 125–400)

## 2012-06-14 LAB — VITAMIN B12: Vitamin B-12: 350 pg/mL (ref 211–911)

## 2012-06-14 LAB — GLUCOSE, CAPILLARY: Glucose-Capillary: 94 mg/dL (ref 70–99)

## 2012-06-14 MED ORDER — SENNOSIDES-DOCUSATE SODIUM 8.6-50 MG PO TABS: 1.0000 | ORAL_TABLET | Freq: Two times a day (BID) | ORAL | Status: DC

## 2012-06-14 MED ORDER — MULTI-VITAMIN/MINERALS PO TABS: 1.0000 | ORAL_TABLET | Freq: Every day | ORAL | Status: DC

## 2012-06-14 NOTE — Plan of Care (Signed)
Problem: Discharge Progression Outcomes Goal: Other Discharge Outcomes/Goals Outcome: Completed/Met Date Met:  06/14/12 [facility] was notified of [facility] discharge Pt Discharged to [facility] family care with family Discharge instructions read to pt and his daughter His daughter verballized understanding of all instructions

## 2012-06-14 NOTE — Progress Notes (Signed)
CSW added pt's d/c meds to [facility] and uploaded into [facility]. FL2 will need to be printed and signed by MD. Please call with any questions.  Dellie Burns, MSW, LCSWA [phone] (Weekends 8:00am-4:30pm)

## 2012-06-14 NOTE — Plan of Care (Signed)
Problem: Discharge Progression Outcomes Goal: Pain controlled with appropriate interventions Outcome: Not Applicable Date Met:  06/14/12 No C/O pain Goal: Activity appropriate for discharge plan Outcome: Completed/Met Date Met:  06/14/12 At baseline Pt ambulates with assist with front wheel walker Goal: Other Discharge Outcomes/Goals Outcome: Completed/Met Date Met:  06/14/12 Discharge instructions read to pt and his daughter. The daughter will transport pt [facility] private car.  Pt discharged with 2liters O2 withfamily to [facility]

## 2012-06-14 NOTE — Discharge Summary (Signed)
Physician Discharge Summary  [name] [name] ZOX:[id number] DOB: 04-25-1931 DOA: 06/12/2012  PCP: Syliva Overman, MD  Admit date: 06/12/2012 Discharge date: 06/14/2012  Recommendations for Outpatient Follow-up:  1. The patient was discharged to home ([facility]) in improved and stable condition. He will need to followup with his primary care physician, Dr. Lodema Hong, in one week.  Discharge Diagnoses:  1. Acute new onset altered mental status, resolved spontaneously. Etiology uncertain, but symptomatology was most consistent with an unwitnessed seizure with spontaneous improvement, per her neurologist Dr. Gerilyn Pilgrim. No antiseizure medication recommended. 2. Senile versus vascular versus Alzheimer's dementia with a history of delusional symptoms. Remains stable. 3. Mild bradycardia. Asymptomatic. The patient's TSH and free T4 were within normal limits. 4. Chronic hypoxic respiratory failure secondary to asbestos exposure/multiple lung nodules/mesothelioma. 5. Hypertension with low normal blood pressures during the hospitalization. 6. Prediabetes. The patient's hemoglobin A1c was 6.2. 7. Anemia, consistent with anemia of chronic disease. His anemia panel revealed a normal total iron of 56, TIBC of 242, and vitamin B12 of 350. 8. Mild deconditioning. 9. Chronic constipation. 10. Hard of hearing.   Discharge Condition: Stable and improved.  Diet recommendation: Carbohydrate modified.  Filed Weights   06/13/12 0357 06/14/12 8119  Weight: 82.781 kg (182 lb 8 oz) 83.144 kg (183 lb 4.8 oz)    History of present illness:  The patient is a 78 year old man with a history significant for dementia, chronic respiratory failure, and prediabetes, who presented to the emergency department on 06/12/2012 with an episode of altered mental status and unresponsiveness at the group home. In the emergency department, he was afebrile and hemodynamically stable. He was oxygenating 95% on nasal  cannula oxygen. His lab data were significant for a normal basic metabolic panel, hemoglobin of 14.7, and a normal troponin I. His chest x-ray revealed stable left hemithorax pleural plaques. CT of his head revealed mild diffuse cortical atrophy, but no acute intracranial abnormality seen. His EKG revealed normal sinus rhythm with nonspecific T-wave abnormalities. His urinalysis was unremarkable. He was admitted for further evaluation and management.  Hospital Course:  The patient was already back to baseline neurologically on admission. He was continued on all if not most of his chronic medications. For further evaluation, a number of studies were ordered. His total CK was 71, well within normal limits. His hemoglobin A1c was 6.2. His anemia panel was essentially normal. His TSH was within normal limits at 1.49. His vitamin B12 level was within normal limits at 350. His RPR was nonreactive. MRI of his brain revealed no acute findings, but with chronic moderate small vessel changes. His EEG revealed no abnormal findings suggestive of epileptiform activity.  Neurologist, Dr. Gerilyn Pilgrim was consulted. Per his assessment, the patient's symptomatology seemed to be most consistent with an unwitnessed seizure with spontaneous improvement. He noted that the patient was treated chronically with several psychotropic medications, but the doses were not high enough to explain his symptoms. He went on to say that there was no evidence of a stroke or metabolic derangement to cause his symptoms. Given the patient's normal EKG, he did not suggest starting the patient on an antiseizure medication empirically.  The patient remained alert, mildly confused, and receptive during the hospitalization. There was no evidence of altered mental status or seizure activity at all. He was discharged on his chronic medications and a multivitamin with iron and additional laxative therapy for chronic  constipation.  Procedures:  EEG.  Consultations:  Neurologist, Dr. Gerilyn Pilgrim  Discharge Exam:  Filed Vitals:   06/13/12 2041 06/13/12 2157 06/14/12 0632 06/14/12 1015  BP:  96/62 91/52 104/63  Pulse:  69 67   Temp:  98.1 F (36.7 C) 98.4 F (36.9 C)   TempSrc:  Oral Oral   Resp:  17 18   Weight:   83.144 kg (183 lb 4.8 oz)   SpO2: 96% 97% 100%     General: Elderly hard of hearing African-American man, lying in bed, in no acute distress. Cardiovascular: S1, S2, with no murmurs rubs or gallops. Respiratory: Rare wheeze, otherwise breathing is nonlabored. Abdomen: Positive bowel sounds, soft, nontender, nondistended. Neurologic: He is alert and oriented to himself and hospital.  Discharge Instructions  Discharge Orders    Future Appointments: Provider: Department: Dept Phone: Center:   07/17/2012 10:15 AM Kerri Perches, MD [facility] [phone] [facility]     Future Orders Please Complete By Expires   Diet - low sodium heart healthy      Increase activity slowly      Discharge instructions      Comments:   The patient needs his blood glucose checked at least once or twice daily. He will need to followup with his primary care physician Dr. Lodema Hong in one to 2 weeks for hospital followup.       Medication List     As of 06/14/2012 11:06 AM    STOP taking these medications         Docusate Sodium 100 MG capsule      TAKE these medications         acetaZOLAMIDE 250 MG tablet   Commonly known as: DIAMOX   Take 250 mg by mouth 4 (four) times daily -  with meals and at bedtime. Take one tablet by mouth with meals and at bedtime      albuterol (2.5 MG/3ML) 0.083% nebulizer solution   Commonly known as: PROVENTIL   Take 2.5 mg by nebulization 4 (four) times daily.      ALPHAGAN P 0.1 % Soln   Generic drug: brimonidine   Place 1 drop into both eyes at bedtime. One drop in both eyes at bedtime      amLODipine 5 MG tablet   Commonly known as: NORVASC    Take 5 mg by mouth daily.      clonazePAM 1 MG tablet   Commonly known as: KLONOPIN   Take 0.5-1 mg by mouth 3 (three) times daily. 1/2 tab in the am and 1/2 in the PM and 1 at bedtime      Dorzolamide HCl-Timolol Mal PF 22.3-6.8 MG/ML Soln   Apply 1 drop to eye 2 (two) times daily.      hydrOXYzine 25 MG tablet   Commonly known as: ATARAX/VISTARIL   Take 25 mg by mouth at bedtime.      latanoprost 0.005 % ophthalmic solution   Commonly known as: XALATAN   Place 1 drop into the left eye at bedtime.      lovastatin 40 MG tablet   Commonly known as: MEVACOR   Take 40 mg by mouth at bedtime.      metFORMIN 500 MG tablet   Commonly known as: GLUCOPHAGE   Take 1 tablet (500 mg total) by mouth 2 (two) times daily with a meal.      multivitamin with minerals tablet   Take 1 tablet by mouth daily.      omeprazole 20 MG capsule   Commonly known as: PRILOSEC   Take 20  mg by mouth daily.      oxybutynin 5 MG 24 hr tablet   Commonly known as: DITROPAN-XL   Take 5 mg by mouth daily.      polyethylene glycol powder powder   Commonly known as: GLYCOLAX/MIRALAX   MIX 17 GRAMS IN 8 OUNCES OF WATER AND DRINK ONCE DAILY.      risperiDONE 0.25 MG tablet   Commonly known as: RISPERDAL   Take 0.25 mg by mouth at bedtime.      Rivastigmine 13.3 MG/24HR Pt24   Place 1 patch onto the skin daily.      senna-docusate 8.6-50 MG per tablet   Commonly known as: Senokot-S   Take 1 tablet by mouth 2 (two) times daily.      SINGULAIR 10 MG tablet   Generic drug: montelukast   Take 10 mg by mouth at bedtime.      Vitamin D (Ergocalciferol) 50000 UNITS Caps   Commonly known as: DRISDOL   Take 50,000 Units by mouth every 7 (seven) days.           Follow-up Information    Follow up with Syliva Overman, MD. Schedule an appointment as soon as possible for a visit in 1 week. (For hospital followup.)    Contact information:   [address] [place] [address] [phone]            The results of significant diagnostics from this hospitalization (including imaging, microbiology, ancillary and laboratory) are listed below for reference.    Significant Diagnostic Studies: Dg Chest 1 View  06/12/2012  *RADIOLOGY REPORT*  Clinical Data: Chest pain.  CHEST - 1 VIEW  Comparison: Feb 07, 2012.  Findings: Cardiomediastinal silhouette appears normal.  Right lung is clear.  Multiple rounded densities are again noted over the left hemithorax which are unchanged.  Prior exam and most consistent with pleural plaques as previously described.  Bony thorax is intact.  IMPRESSION: No change involving the left hemithorax pleural plaques compared to prior exam.  No acute cardiopulmonary abnormality seen.   Original Report Authenticated By: Venita Sheffield., M.D.    Ct Head Wo Contrast  06/12/2012  *RADIOLOGY REPORT*  Clinical Data: Altered mental status.  CT HEAD WITHOUT CONTRAST  Technique:  Contiguous axial images were obtained from the base of the skull through the vertex without contrast.  Comparison: None.  Findings: Bony calvarium is intact.  Mild diffuse cortical atrophy is noted.  No mass effect or midline shift is noted.  Ventricular size is within normal limits.  There is no evidence of mass, hemorrhage or acute infarction.  IMPRESSION: Mild diffuse cortical atrophy.  No acute intracranial abnormality seen.   Original Report Authenticated By: Venita Sheffield., M.D.    Mr Brain Wo Contrast  06/13/2012  *RADIOLOGY REPORT*  Clinical Data: Altered mental status.  Weakness.  Shaking.  MRI HEAD WITHOUT CONTRAST  Technique:  Multiplanar, multiecho pulse sequences of the brain and surrounding structures were obtained according to standard protocol without intravenous contrast.  Comparison: Head CT 06/12/2012.  Findings: Diffusion imaging is negative for acute or subacute infarction.  There are mild chronic small vessel changes within the pons.  No cerebellar insult.  The cerebral  hemispheres show moderate chronic small vessel disease affecting the deep and subcortical white matter.  No cortical or large vessel territory infarction.  No mass lesion, hemorrhage, hydrocephalus or extra- axial collection.  No pituitary mass.  No inflammatory sinus disease.  No  skull or skull base lesion.  IMPRESSION: No acute finding.  Ordinary moderate chronic small vessel change affecting the pons and cerebral hemispheric white matter, fairly typical for age.   Original Report Authenticated By: Thomasenia Sales, M.D.     Microbiology: Recent Results (from the past 240 hour(s))  URINE CULTURE     Status: Normal   Collection Time   06/12/12  3:54 PM      Component Value Range Status Comment   Specimen Description URINE, CLEAN CATCH   Final    Special Requests NONE   Final    Culture  Setup Time 06/13/2012 01:41   Final    Colony Count 9,000 COLONIES/ML   Final    Culture INSIGNIFICANT GROWTH   Final    Report Status 06/13/2012 FINAL   Final      Labs: Basic Metabolic Panel:  Lab 06/13/12 4540 06/12/12 1453  NA 140 138  K 4.0 3.8  CL 109 107  CO2 25 24  GLUCOSE 83 96  BUN 16 18  CREATININE 1.13 1.23  CALCIUM 9.8 10.2  MG -- 1.9  PHOS -- 3.4   Liver Function Tests:  Lab 06/13/12 0522  AST 11  ALT 8  ALKPHOS 79  BILITOT 0.2*  PROT 7.1  ALBUMIN 3.4*   No results found for this basename: LIPASE:5,AMYLASE:5 in the last 168 hours No results found for this basename: AMMONIA:5 in the last 168 hours CBC:  Lab 06/13/12 0522 06/12/12 1453  WBC 6.7 7.4  NEUTROABS -- 4.5  HGB 10.4* 10.6*  HCT 35.5* 36.2*  MCV 90.8 91.4  PLT 162 161   Cardiac Enzymes:  Lab 06/13/12 0552 06/12/12 1453  CKTOTAL 71 --  CKMB -- --  CKMBINDEX -- --  TROPONINI -- <0.30   BNP: BNP (last 3 results) No results found for this basename: PROBNP:3 in the last 8760 hours CBG:  Lab 06/14/12 0729 06/13/12 2159 06/13/12 1556 06/13/12 1131 06/13/12 0734  GLUCAP 94 101* 104* 98 90    Time  coordinating discharge: Greater than 30 minutes  Signed:  Oprah Camarena  [facility] 06/14/2012, 11:06 AM

## 2012-06-16 ENCOUNTER — Telehealth: Payer: Self-pay | Admitting: Family Medicine

## 2012-06-16 NOTE — Telephone Encounter (Signed)
[facility].  Will call facility and notify.

## 2012-06-16 NOTE — Telephone Encounter (Signed)
Script written, please fax and let them know 

## 2012-06-17 ENCOUNTER — Telehealth: Payer: Self-pay

## 2012-06-17 NOTE — Telephone Encounter (Signed)
Facility aware of order sent to [facility] at their request.

## 2012-06-17 NOTE — Telephone Encounter (Signed)
pls let them know although d/c summary states this, with his diagnosis of prediabetes, insurance will not cover and this is not medically justified. I will continue HBA1C testing every 3 to 4 months,ensure they restrict him from excessive sweet and carb intake so he does not become diabetic. You may provide tele info for them to attend class at hospital on diet with no referral necessary also

## 2012-06-23 ENCOUNTER — Ambulatory Visit (INDEPENDENT_AMBULATORY_CARE_PROVIDER_SITE_OTHER): Payer: PRIVATE HEALTH INSURANCE | Admitting: Family Medicine

## 2012-06-23 VITALS — BP 132/64 | HR 102 | Resp 18 | Ht 66.0 in | Wt 175.1 lb

## 2012-06-23 DIAGNOSIS — I10 Essential (primary) hypertension: Secondary | ICD-10-CM

## 2012-06-23 DIAGNOSIS — R569 Unspecified convulsions: Secondary | ICD-10-CM

## 2012-06-23 DIAGNOSIS — J9610 Chronic respiratory failure, unspecified whether with hypoxia or hypercapnia: Secondary | ICD-10-CM

## 2012-06-23 DIAGNOSIS — F22 Delusional disorders: Secondary | ICD-10-CM

## 2012-06-23 DIAGNOSIS — F0392 Unspecified dementia, unspecified severity, with psychotic disturbance: Secondary | ICD-10-CM

## 2012-06-23 DIAGNOSIS — F0390 Unspecified dementia without behavioral disturbance: Secondary | ICD-10-CM

## 2012-06-23 NOTE — Patient Instructions (Addendum)
Continue current medications Wear oxygen at all times  Keep f/u appt

## 2012-06-23 NOTE — Progress Notes (Signed)
  Subjective:    Patient ID: [name] [name], male    DOB: [birth year], 78 y.o.   MRN: [id number]  HPI Pt here to f/u admission for AMS, notes say concern for seizure like activity though unwitnessed. Evaluated by neurology no medication changes and no seizures medications started, CT, MRI ,cardiac work -up benign. At time of admission by [facility] he was back to baseline. Facility has no concerns. No new complaints by pt. Wearing oxygen as prescribed. Flu shot needed     Review of Systems  GEN- denies fatigue, fever, weight loss,weakness, recent illness HEENT- denies eye drainage, change in vision, nasal discharge, CVS- denies chest pain, palpitations RESP- + SOB, cough, wheeze ABD- denies N/V, change in stools, abd pain GU- denies dysuria, hematuria, dribbling,+ incontinence MSK- denies joint pain, muscle aches, injury Neuro- denies headache, dizziness, syncope, seizure activity      Objective:   Physical Exam GEN- NAD, alert and oriented x3, very hard of hearing HEENT- PERRL, EOMI, non injected sclera, pink conjunctiva, MMM, oropharynx clear Neck- Supple, no thryomegaly CVS- RRR, distant HS, no appreciable murmur RESP-decreased BS bases, normal WOB with oxygen ABD-NABS, soft,NT,ND EXT- No edema, Pulses- Radial 2+ Neuro- no focal deficits        Assessment & Plan:

## 2012-06-23 NOTE — Telephone Encounter (Signed)
Patient in for visit and group home worker given information to give to director.

## 2012-06-24 ENCOUNTER — Telehealth: Payer: Self-pay | Admitting: Family Medicine

## 2012-06-25 ENCOUNTER — Telehealth: Payer: Self-pay | Admitting: Family Medicine

## 2012-06-25 ENCOUNTER — Encounter: Payer: Self-pay | Admitting: Family Medicine

## 2012-06-25 NOTE — Telephone Encounter (Signed)
rx sent for bed and note sent to [facility] regarding testing supplies

## 2012-06-25 NOTE — Assessment & Plan Note (Signed)
On oxygen therapy, doing well, FLu shot given

## 2012-06-25 NOTE — Assessment & Plan Note (Signed)
Concern for seizure like activity, continue to monitor, no further events he is at baseline.

## 2012-06-25 NOTE — Assessment & Plan Note (Signed)
Well controlled 

## 2012-06-25 NOTE — Assessment & Plan Note (Signed)
At baseline 

## 2012-06-25 NOTE — Telephone Encounter (Signed)
Facility aware that patient does not qualify for diabetic supplies and rx for hospital bed faxed to [facility].

## 2012-06-25 NOTE — Telephone Encounter (Signed)
Order for bed written pls fax and let them know. I have already advised he DOES NOT qualify for diabetic testing supplies

## 2012-07-17 ENCOUNTER — Encounter: Payer: Self-pay | Admitting: Family Medicine

## 2012-07-17 ENCOUNTER — Ambulatory Visit (INDEPENDENT_AMBULATORY_CARE_PROVIDER_SITE_OTHER): Payer: PRIVATE HEALTH INSURANCE | Admitting: Family Medicine

## 2012-07-17 DIAGNOSIS — F0390 Unspecified dementia without behavioral disturbance: Secondary | ICD-10-CM

## 2012-07-18 ENCOUNTER — Ambulatory Visit (INDEPENDENT_AMBULATORY_CARE_PROVIDER_SITE_OTHER): Payer: PRIVATE HEALTH INSURANCE | Admitting: Family Medicine

## 2012-07-18 ENCOUNTER — Encounter: Payer: Self-pay | Admitting: Family Medicine

## 2012-07-18 VITALS — BP 100/60 | HR 80 | Resp 16 | Ht 66.0 in | Wt 180.4 lb

## 2012-07-18 DIAGNOSIS — E785 Hyperlipidemia, unspecified: Secondary | ICD-10-CM

## 2012-07-18 DIAGNOSIS — R7303 Prediabetes: Secondary | ICD-10-CM

## 2012-07-18 DIAGNOSIS — I10 Essential (primary) hypertension: Secondary | ICD-10-CM

## 2012-07-18 DIAGNOSIS — R569 Unspecified convulsions: Secondary | ICD-10-CM

## 2012-07-18 DIAGNOSIS — J9610 Chronic respiratory failure, unspecified whether with hypoxia or hypercapnia: Secondary | ICD-10-CM

## 2012-07-18 DIAGNOSIS — R7309 Other abnormal glucose: Secondary | ICD-10-CM

## 2012-07-18 DIAGNOSIS — F22 Delusional disorders: Secondary | ICD-10-CM

## 2012-07-18 MED ORDER — AMLODIPINE BESYLATE 2.5 MG PO TABS: 2.5000 mg | ORAL_TABLET | Freq: Every day | ORAL | Status: DC

## 2012-07-18 NOTE — Progress Notes (Signed)
  Subjective:    Patient ID: [name] [name], male    DOB: 1931-05-26, 78 y.o.   MRN: [id number]  HPI Pt in for f/u from recent hospitalization from 10/3 to 06/14/2012, when he presented with altered mental status of unclear etiology. 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 Still continues to c/o urine going up into his chest , and abdominal pain, but is certainly back to his baseline status Repots good appetite and denies constipation or diareah.He is an unreliable historian due to dementia No concerns sent from the facility Review of Systems See HPI Denies recent fever or chills. Denies sinus pressure, nasal congestion,  or sore throat. Denies productive cough or wheezing. Denies chest pains, palpitations and leg swelling Denies abdominal pain, nausea, vomiting,diarrhea or constipation.   Denies dysuria, frequency, hesitancy or incontinence. Chronic joint pain,  and limitation in mobility. C/o intermittent  headaches, hearing and vision loss. Denies uncontrolled  depression, anxiety or insomnia. Denies skin break down or rash.       Objective:   Physical Exam Patient alert and in no cardiopulmonary distress.Oxygen dependent, reduced vision and hearing, ambulates with assistance  HEENT: No facial asymmetry, EOMI, no sinus tenderness,  oropharynx and moist.  Neck decreased ROM no adenopathy.  Chest: Clear to auscultation bilaterally.Decreased air entry throughout  CVS: S1, S2 no murmurs, no S3.  ABD: Soft non tender. Bowel sounds normal.  Ext: No edema  MS: decreased  ROM spine, shoulders, hips and knees.  Skin: Intact, no ulcerations or rash noted.  Psych: . Memory loss, confused, but this is his baseline not anxious or depressed appearing.  CNS: Power normal throughout.        Assessment & Plan:

## 2012-07-18 NOTE — Assessment & Plan Note (Signed)
Over corrected, reduce amlodipine to 2.5mg  tab

## 2012-07-18 NOTE — Patient Instructions (Addendum)
F/u in 3.5 month, call if you need me before.  New lower dose of amlodipine for blood pressure, your pressure is low, start amlodipine 2.5 mg one daily, starting tomorrow  No other changes in medication at this time

## 2012-07-19 NOTE — Assessment & Plan Note (Signed)
Stable at this time,nback tobaseline

## 2012-07-19 NOTE — Assessment & Plan Note (Signed)
No recurrence  of seizure activity

## 2012-07-19 NOTE — Assessment & Plan Note (Signed)
Hyperlipidemia:Low fat diet discussed and encouraged.  Uncontrolled when last checked, updated labs needed

## 2012-07-19 NOTE — Assessment & Plan Note (Signed)
Pt on metformin, no need for blood glucose testing at this time

## 2012-07-19 NOTE — Assessment & Plan Note (Signed)
Stable on supplemental oxygen 

## 2012-07-19 NOTE — Progress Notes (Signed)
  Subjective:    Patient ID: [name] [name], male    DOB: 09-02-1931, 78 y.o.   MRN: [id number]  HPI Pt did not come for appt   Review of Systems     Objective:   Physical Exam        Assessment & Plan:

## 2012-07-24 ENCOUNTER — Encounter (HOSPITAL_COMMUNITY): Payer: Self-pay | Admitting: *Deleted

## 2012-07-24 ENCOUNTER — Emergency Department (HOSPITAL_COMMUNITY): Payer: PRIVATE HEALTH INSURANCE

## 2012-07-24 ENCOUNTER — Inpatient Hospital Stay (HOSPITAL_COMMUNITY)
Admission: AD | Admit: 2012-07-24 | Discharge: 2012-07-28 | DRG: 081 | Disposition: A | Discharge status: Nursing Home (Includes ICF) | Payer: PRIVATE HEALTH INSURANCE | Attending: Internal Medicine | Admitting: Internal Medicine

## 2012-07-24 DIAGNOSIS — R109 Unspecified abdominal pain: Secondary | ICD-10-CM | POA: Diagnosis present

## 2012-07-24 DIAGNOSIS — R05 Cough: Secondary | ICD-10-CM

## 2012-07-24 DIAGNOSIS — Z859 Personal history of malignant neoplasm, unspecified: Secondary | ICD-10-CM

## 2012-07-24 DIAGNOSIS — I10 Essential (primary) hypertension: Secondary | ICD-10-CM | POA: Diagnosis present

## 2012-07-24 DIAGNOSIS — R404 Transient alteration of awareness: Principal | ICD-10-CM | POA: Diagnosis present

## 2012-07-24 DIAGNOSIS — F411 Generalized anxiety disorder: Secondary | ICD-10-CM | POA: Diagnosis present

## 2012-07-24 DIAGNOSIS — J9610 Chronic respiratory failure, unspecified whether with hypoxia or hypercapnia: Secondary | ICD-10-CM | POA: Diagnosis present

## 2012-07-24 DIAGNOSIS — F05 Delirium due to known physiological condition: Secondary | ICD-10-CM | POA: Diagnosis present

## 2012-07-24 DIAGNOSIS — R059 Cough, unspecified: Secondary | ICD-10-CM

## 2012-07-24 DIAGNOSIS — F0390 Unspecified dementia without behavioral disturbance: Secondary | ICD-10-CM | POA: Diagnosis present

## 2012-07-24 DIAGNOSIS — R7303 Prediabetes: Secondary | ICD-10-CM

## 2012-07-24 DIAGNOSIS — G4700 Insomnia, unspecified: Secondary | ICD-10-CM

## 2012-07-24 DIAGNOSIS — R001 Bradycardia, unspecified: Secondary | ICD-10-CM

## 2012-07-24 DIAGNOSIS — H409 Unspecified glaucoma: Secondary | ICD-10-CM | POA: Diagnosis present

## 2012-07-24 DIAGNOSIS — G8929 Other chronic pain: Secondary | ICD-10-CM | POA: Diagnosis present

## 2012-07-24 DIAGNOSIS — J309 Allergic rhinitis, unspecified: Secondary | ICD-10-CM

## 2012-07-24 DIAGNOSIS — D649 Anemia, unspecified: Secondary | ICD-10-CM

## 2012-07-24 DIAGNOSIS — M549 Dorsalgia, unspecified: Secondary | ICD-10-CM

## 2012-07-24 DIAGNOSIS — Z79899 Other long term (current) drug therapy: Secondary | ICD-10-CM

## 2012-07-24 DIAGNOSIS — F22 Delusional disorders: Secondary | ICD-10-CM

## 2012-07-24 DIAGNOSIS — R0602 Shortness of breath: Secondary | ICD-10-CM

## 2012-07-24 DIAGNOSIS — R5381 Other malaise: Secondary | ICD-10-CM

## 2012-07-24 DIAGNOSIS — R918 Other nonspecific abnormal finding of lung field: Secondary | ICD-10-CM

## 2012-07-24 DIAGNOSIS — R569 Unspecified convulsions: Secondary | ICD-10-CM

## 2012-07-24 DIAGNOSIS — R0902 Hypoxemia: Secondary | ICD-10-CM

## 2012-07-24 DIAGNOSIS — R4182 Altered mental status, unspecified: Secondary | ICD-10-CM

## 2012-07-24 DIAGNOSIS — E785 Hyperlipidemia, unspecified: Secondary | ICD-10-CM | POA: Diagnosis present

## 2012-07-24 DIAGNOSIS — E119 Type 2 diabetes mellitus without complications: Secondary | ICD-10-CM | POA: Diagnosis present

## 2012-07-24 DIAGNOSIS — Z66 Do not resuscitate: Secondary | ICD-10-CM | POA: Diagnosis present

## 2012-07-24 DIAGNOSIS — R031 Nonspecific low blood-pressure reading: Secondary | ICD-10-CM | POA: Diagnosis present

## 2012-07-24 DIAGNOSIS — H9190 Unspecified hearing loss, unspecified ear: Secondary | ICD-10-CM | POA: Diagnosis present

## 2012-07-24 DIAGNOSIS — K219 Gastro-esophageal reflux disease without esophagitis: Secondary | ICD-10-CM | POA: Diagnosis present

## 2012-07-24 DIAGNOSIS — Z87891 Personal history of nicotine dependence: Secondary | ICD-10-CM

## 2012-07-24 DIAGNOSIS — K5900 Constipation, unspecified: Secondary | ICD-10-CM | POA: Diagnosis not present

## 2012-07-24 LAB — URINALYSIS, ROUTINE W REFLEX MICROSCOPIC
Leukocytes, UA: NEGATIVE
Nitrite: NEGATIVE
Protein, ur: NEGATIVE mg/dL
Urobilinogen, UA: 0.2 mg/dL (ref 0.0–1.0)

## 2012-07-24 LAB — BLOOD GAS, ARTERIAL
pCO2 arterial: 48.3 mmHg — ABNORMAL HIGH (ref 35.0–45.0)
pH, Arterial: 7.258 — ABNORMAL LOW (ref 7.350–7.450)

## 2012-07-24 LAB — PROTIME-INR
INR: 0.93 (ref 0.00–1.49)
Prothrombin Time: 12.4 seconds (ref 11.6–15.2)

## 2012-07-24 LAB — COMPREHENSIVE METABOLIC PANEL
ALT: 9 U/L (ref 0–53)
Alkaline Phosphatase: 81 U/L (ref 39–117)
BUN: 15 mg/dL (ref 6–23)
CO2: 25 mEq/L (ref 19–32)
Chloride: 105 mEq/L (ref 96–112)
GFR calc Af Amer: 73 mL/min — ABNORMAL LOW (ref >90)
GFR calc non Af Amer: 63 mL/min — ABNORMAL LOW (ref >90)
Glucose, Bld: 114 mg/dL — ABNORMAL HIGH (ref 70–99)
Potassium: 4.4 mEq/L (ref 3.5–5.1)
Sodium: 138 mEq/L (ref 135–145)
Total Bilirubin: 0.2 mg/dL — ABNORMAL LOW (ref 0.3–1.2)

## 2012-07-24 LAB — LACTIC ACID, PLASMA: Lactic Acid, Venous: 1.8 mmol/L (ref 0.5–2.2)

## 2012-07-24 LAB — CBC WITH DIFFERENTIAL/PLATELET
Hemoglobin: 10.8 g/dL — ABNORMAL LOW (ref 13.0–17.0)
Lymphocytes Relative: 26 % (ref 12–46)
Lymphs Abs: 2.1 10*3/uL (ref 0.7–4.0)
MCV: 91 fL (ref 78.0–100.0)
Monocytes Relative: 7 % (ref 3–12)
Neutrophils Relative %: 65 % (ref 43–77)
Platelets: 173 10*3/uL (ref 150–400)
RBC: 4.09 MIL/uL — ABNORMAL LOW (ref 4.22–5.81)
WBC: 8 10*3/uL (ref 4.0–10.5)

## 2012-07-24 LAB — MRSA PCR SCREENING: MRSA by PCR: NEGATIVE

## 2012-07-24 LAB — GLUCOSE, CAPILLARY: Glucose-Capillary: 107 mg/dL — ABNORMAL HIGH (ref 70–99)

## 2012-07-24 MED ORDER — PANTOPRAZOLE SODIUM 40 MG PO TBEC: 40.0000 mg | DELAYED_RELEASE_TABLET | Freq: Every day | ORAL | Status: DC

## 2012-07-24 MED ORDER — MONTELUKAST SODIUM 10 MG PO TABS: 10.0000 mg | ORAL_TABLET | Freq: Every day | ORAL | Status: DC

## 2012-07-24 MED ORDER — HYDROXYZINE HCL 25 MG PO TABS
25.0000 mg | ORAL_TABLET | Freq: Every day | ORAL | Status: DC
  Administered 2012-07-25: 25 mg via ORAL
  Filled 2012-07-24: qty 1

## 2012-07-24 MED ORDER — ONDANSETRON HCL 4 MG/2ML IJ SOLN: 4.0000 mg | Freq: Four times a day (QID) | INTRAMUSCULAR | Status: DC | PRN

## 2012-07-24 MED ORDER — ALBUTEROL SULFATE (5 MG/ML) 0.5% IN NEBU: 2.5000 mg | INHALATION_SOLUTION | RESPIRATORY_TRACT | Status: DC | PRN

## 2012-07-24 MED ORDER — RIVASTIGMINE 13.3 MG/24HR TD PT24
1.0000 | MEDICATED_PATCH | Freq: Every day | TRANSDERMAL | Status: DC
  Filled 2012-07-24: qty 1

## 2012-07-24 MED ORDER — TIMOLOL MALEATE 0.5 % OP SOLN
1.0000 [drp] | Freq: Two times a day (BID) | OPHTHALMIC | Status: DC
  Administered 2012-07-24 – 2012-07-28 (×8): 1 [drp] via OPHTHALMIC
  Filled 2012-07-24: qty 5

## 2012-07-24 MED ORDER — DORZOLAMIDE HCL-TIMOLOL MAL PF 22.3-6.8 MG/ML OP SOLN: 1.0000 [drp] | Freq: Two times a day (BID) | OPHTHALMIC | Status: DC

## 2012-07-24 MED ORDER — BRIMONIDINE TARTRATE 0.15 % OP SOLN
1.0000 [drp] | Freq: Every day | OPHTHALMIC | Status: DC
  Administered 2012-07-24 – 2012-07-27 (×4): 1 [drp] via OPHTHALMIC
  Filled 2012-07-24: qty 5

## 2012-07-24 MED ORDER — SODIUM CHLORIDE 0.9 % IJ SOLN
3.0000 mL | Freq: Two times a day (BID) | INTRAMUSCULAR | Status: DC
  Administered 2012-07-24 – 2012-07-27 (×5): 3 mL via INTRAVENOUS

## 2012-07-24 MED ORDER — LATANOPROST 0.005 % OP SOLN
OPHTHALMIC | Status: AC
  Filled 2012-07-24: qty 2.5

## 2012-07-24 MED ORDER — SODIUM CHLORIDE 0.9 % IV SOLN: INTRAVENOUS | Status: DC

## 2012-07-24 MED ORDER — RISPERIDONE 0.5 MG PO TABS
0.2500 mg | ORAL_TABLET | Freq: Every day | ORAL | Status: DC
  Filled 2012-07-24: qty 1
  Filled 2012-07-24 (×3): qty 0.5

## 2012-07-24 MED ORDER — PILOCARPINE HCL 1 % OP SOLN: 1.0000 [drp] | Freq: Two times a day (BID) | OPHTHALMIC | Status: DC

## 2012-07-24 MED ORDER — ACETAZOLAMIDE 250 MG PO TABS: 250.0000 mg | ORAL_TABLET | Freq: Three times a day (TID) | ORAL | Status: DC

## 2012-07-24 MED ORDER — SIMVASTATIN 20 MG PO TABS: 20.0000 mg | ORAL_TABLET | Freq: Every day | ORAL | Status: DC

## 2012-07-24 MED ORDER — AMLODIPINE BESYLATE 5 MG PO TABS: 2.5000 mg | ORAL_TABLET | Freq: Every day | ORAL | Status: DC

## 2012-07-24 MED ORDER — BRIMONIDINE TARTRATE 0.2 % OP SOLN
OPHTHALMIC | Status: AC
  Filled 2012-07-24: qty 5

## 2012-07-24 MED ORDER — SODIUM CHLORIDE 0.9 % IV BOLUS (SEPSIS)
250.0000 mL | Freq: Once | INTRAVENOUS | Status: AC
  Administered 2012-07-24: 250 mL via INTRAVENOUS

## 2012-07-24 MED ORDER — SODIUM CHLORIDE 0.9 % IV SOLN
INTRAVENOUS | Status: DC
  Administered 2012-07-24: via INTRAVENOUS

## 2012-07-24 MED ORDER — BRIMONIDINE TARTRATE 0.1 % OP SOLN: 1.0000 [drp] | Freq: Every day | OPHTHALMIC | Status: DC

## 2012-07-24 MED ORDER — METFORMIN HCL 500 MG PO TABS: 500.0000 mg | ORAL_TABLET | Freq: Two times a day (BID) | ORAL | Status: DC

## 2012-07-24 MED ORDER — HEPARIN SODIUM (PORCINE) 5000 UNIT/ML IJ SOLN
5000.0000 [IU] | Freq: Three times a day (TID) | INTRAMUSCULAR | Status: DC
  Administered 2012-07-24 – 2012-07-28 (×11): 5000 [IU] via SUBCUTANEOUS
  Filled 2012-07-24 (×11): qty 1

## 2012-07-24 MED ORDER — DORZOLAMIDE HCL 2 % OP SOLN
1.0000 [drp] | Freq: Two times a day (BID) | OPHTHALMIC | Status: DC
  Administered 2012-07-24 – 2012-07-28 (×8): 1 [drp] via OPHTHALMIC
  Filled 2012-07-24: qty 10

## 2012-07-24 MED ORDER — NALOXONE HCL 0.4 MG/ML IJ SOLN
INTRAMUSCULAR | Status: AC
  Administered 2012-07-24: 15:00:00
  Filled 2012-07-24: qty 1

## 2012-07-24 MED ORDER — LATANOPROST 0.005 % OP SOLN
1.0000 [drp] | Freq: Every day | OPHTHALMIC | Status: DC
  Administered 2012-07-24 – 2012-07-27 (×4): 1 [drp] via OPHTHALMIC
  Filled 2012-07-24: qty 2.5

## 2012-07-24 MED ORDER — OXYBUTYNIN CHLORIDE ER 5 MG PO TB24: 5.0000 mg | ORAL_TABLET | Freq: Every day | ORAL | Status: DC

## 2012-07-24 MED ORDER — ONDANSETRON HCL 4 MG PO TABS: 4.0000 mg | ORAL_TABLET | Freq: Four times a day (QID) | ORAL | Status: DC | PRN

## 2012-07-24 MED ORDER — SENNOSIDES-DOCUSATE SODIUM 8.6-50 MG PO TABS
1.0000 | ORAL_TABLET | Freq: Two times a day (BID) | ORAL | Status: DC
  Administered 2012-07-25 – 2012-07-27 (×6): 1 via ORAL
  Filled 2012-07-24 (×6): qty 1

## 2012-07-24 NOTE — Progress Notes (Signed)
Dr. Orvan Falconer contacted and SBAR completed. -concerns to be addressed  Awaiting instructions/orders.

## 2012-07-24 NOTE — Progress Notes (Signed)
Patient continues to rest with both eyes closed and in no apparent distress. -vitals are stable  -patient's responds but no well enough to administer PO medications -patient continues to be somnolent Hospitalist to be contacted with concerns.

## 2012-07-24 NOTE — Progress Notes (Signed)
Requested ophthalmic medications received and are to be administered at this time.

## 2012-07-24 NOTE — ED Provider Notes (Addendum)
History  This chart was scribed for Shelda Jakes, MD by Ardeen Jourdain, ED Scribe. This patient was seen in room APA04/APA04 and the patient's care was started at 1452.  Level 5 Caveat: Pt is unresponsive   CSN: [id number]  Arrival date & time 07/24/12  1424   First MD Initiated Contact with Patient 07/24/12 1452      Chief Complaint  Patient presents with  . Altered Mental Status     The history is provided by the EMS personnel. The history is limited by the condition of the patient. No language interpreter was used.    [name] [name] is a 78 y.o. male brought in by ambulance, who presents to the Emergency Department complaining of who presents to the Emergency Department complaining of an altered mental status. He is a resident of [facility]. EMS states he was normal at breakfast then lost consciousness and slipped into this state. Slightly responsive but non-purposefully. 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 He is a former smoker but denies alcohol use.   Past Medical History  Diagnosis Date  . Glaucoma(365)   . Anxiety   . GERD (gastroesophageal reflux disease)   . Hyperlipidemia   . Hypertension   . Chronic abdominal pain   . Mesothelioma   . 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 Chronic respiratory failure   . DM type 2 (diabetes mellitus, type 2) 06/13/2012  . Altered mental status 06/12/2012    Unwitnessed isolated seizure suspected, but because of the EEG being normal, Dr. Gerilyn Pilgrim did not recommend antiseizure therapy.  . Hard of hearing   . Seizure 06/14/2012    Isolated seizure suspected, but not confirmed.    Past Surgical History  Procedure Date  . Hernia repair   . Prostate surgery   . Right eye laser surgery 2005  . Left eye surgery for glaucoma at [facility] 10/2009    Family History  Problem Relation Age of Onset  . Lung cancer Sister     History   Substance Use Topics  . Smoking status: Former Games developer  . Smokeless tobacco: Not on file  . Alcohol Use: No      Review of Systems  Unable to perform ROS: Mental status change  Gastrointestinal: Positive for abdominal pain.    Allergies  Review of patient's allergies indicates no known allergies.  Home Medications   Current Outpatient Rx  Name  Route  Sig  Dispense  Refill  . ACETAZOLAMIDE 250 MG PO TABS   Oral   Take 250 mg by mouth 4 (four) times daily -  with meals and at bedtime. Take one tablet by mouth with meals and at bedtime         . ALBUTEROL SULFATE (2.5 MG/3ML) 0.083% IN NEBU   Nebulization   Take 2.5 mg by nebulization 4 (four) times daily.         Marland Kitchen AMLODIPINE BESYLATE 2.5 MG PO TABS   Oral   Take 1 tablet (2.5 mg total) by mouth daily.   30 tablet   11     Dose reduction effective 07/18/2012. Discontinue  ...   . BRIMONIDINE TARTRATE 0.1 % OP SOLN   Both Eyes   Place 1 drop into both eyes at bedtime. One drop in both eyes at bedtime         . CLONAZEPAM 1 MG PO TABS   Oral  Take 0.5-1 mg by mouth 3 (three) times daily. 1/2 tab in the am and 1/2 in the PM and 1 at bedtime         . DORZOLAMIDE HCL-TIMOLOL MAL PF 22.3-6.8 MG/ML OP SOLN   Ophthalmic   Apply 1 drop to eye 2 (two) times daily.          Marland Kitchen HYDROXYZINE HCL 25 MG PO TABS   Oral   Take 25 mg by mouth at bedtime.           Marland Kitchen LATANOPROST 0.005 % OP SOLN   Left Eye   Place 1 drop into the left eye at bedtime.          Marland Kitchen LOVASTATIN 40 MG PO TABS   Oral   Take 40 mg by mouth at bedtime.           Marland Kitchen METFORMIN HCL 500 MG PO TABS   Oral   Take 1 tablet (500 mg total) by mouth 2 (two) times daily with a meal.   60 tablet   11   . MONTELUKAST SODIUM 10 MG PO TABS   Oral   Take 10 mg by mouth at bedtime.           . MULTI-VITAMIN/MINERALS PO TABS   Oral   Take 1 tablet by mouth daily.   30 tablet   6   . OMEPRAZOLE 20 MG PO CPDR   Oral   Take 20 mg by mouth  daily.         . OXYBUTYNIN CHLORIDE ER 5 MG PO TB24   Oral   Take 5 mg by mouth daily.           Marland Kitchen PILOCARPINE HCL 1 % OP SOLN   Both Eyes   Place 1 % into both eyes Daily.         Marland Kitchen RISPERIDONE 0.25 MG PO TABS   Oral   Take 0.25 mg by mouth at bedtime.           Marland Kitchen RIVASTIGMINE 13.3 MG/24HR TD PT24   Transdermal   Place 1 patch onto the skin daily.   30 patch   3   . SENNOSIDES-DOCUSATE SODIUM 8.6-50 MG PO TABS   Oral   Take 1 tablet by mouth 2 (two) times daily.   60 tablet   3   . VITAMIN D (ERGOCALCIFEROL) 50000 UNITS PO CAPS   Oral   Take 50,000 Units by mouth every 7 (seven) days.           Triage Vitals: BP 105/68  Pulse 69  SpO2 69%  Physical Exam  Nursing note and vitals reviewed. Constitutional: He appears well-developed and well-nourished. No distress.       Feels cool, forehead and chest appropriately warm  HENT:  Head: Normocephalic and atraumatic.       Mucus membranes slightly dry    Eyes: Conjunctivae normal and EOM are normal. Pupils are equal, round, and reactive to light.       2 mm pupils   Neck: Normal range of motion. Neck supple. No tracheal deviation present.  Cardiovascular: Normal rate, regular rhythm and normal heart sounds.   No murmur heard.      Capillary refill to toes 1 second bilaterally   Pulmonary/Chest: Effort normal and breath sounds normal. No respiratory distress.       Slight cough   Abdominal: Soft. Bowel sounds are normal. There is no tenderness.  Abdomen seems distended but soft, does not seem to be tender   Musculoskeletal: Normal range of motion. He exhibits no edema.  Lymphadenopathy:    He has no cervical adenopathy.  Neurological:       Mild response to sternal rub, not purposeful, no movement of right arms or legs, movement of left arm and facial muscles   Skin: Skin is warm and dry.  Psychiatric: He has a normal mood and affect. His behavior is normal.    ED Course  Procedures (including  critical care time)  DIAGNOSTIC STUDIES: Oxygen Saturation is 69% on [place], normal  by my interpretation.    COORDINATION OF CARE:  3:10 PM: Dr performed exam on pt  Labs Reviewed  CBC WITH DIFFERENTIAL - Abnormal; Notable for the following:    RBC 4.09 (*)     Hemoglobin 10.8 (*)     HCT 37.2 (*)     MCHC 29.0 (*)     All other components within normal limits  COMPREHENSIVE METABOLIC PANEL - Abnormal; Notable for the following:    Glucose, Bld 114 (*)     Calcium 10.9 (*)     Total Protein 8.4 (*)     Total Bilirubin 0.2 (*)     GFR calc non Af Amer 63 (*)     GFR calc Af Amer 73 (*)     All other components within normal limits  URINALYSIS, ROUTINE W REFLEX MICROSCOPIC - Abnormal; Notable for the following:    Specific Gravity, Urine >1.030 (*)     All other components within normal limits  BLOOD GAS, ARTERIAL - Abnormal; Notable for the following:    pH, Arterial 7.258 (*)     pCO2 arterial 48.3 (*)     pO2, Arterial 114.0 (*)     Acid-base deficit 5.1 (*)     All other components within normal limits  GLUCOSE, CAPILLARY - Abnormal; Notable for the following:    Glucose-Capillary 107 (*)     All other components within normal limits  LIPASE, BLOOD  PROTIME-INR  TROPONIN I  LACTIC ACID, PLASMA  CULTURE, BLOOD (ROUTINE X 2)  CULTURE, BLOOD (ROUTINE X 2)   Ct Head Wo Contrast  07/24/2012  *RADIOLOGY REPORT*  Clinical Data: Altered mental status  CT HEAD WITHOUT CONTRAST  Technique:  Contiguous axial images were obtained from the base of the skull through the vertex without contrast.  Comparison: Brain MRI 06/13/2012  Findings: No skull fracture is noted.  Paranasal sinuses and mastoid air cells are unremarkable.  No intracranial hemorrhage, mass effect or midline shift.  Mild cerebral atrophy.  Stable periventricular and patchy subcortical chronic white matter disease.  No definite acute cortical infarction.  No mass lesion is noted on this unenhanced scan.  IMPRESSION: No  acute intracranial abnormality.  No definite acute cortical infarction.  Stable cerebral atrophy.  Stable chronic white matter disease.   Original Report Authenticated By: Natasha Mead, M.D.    Dg Chest Portable 1 View  07/24/2012  *RADIOLOGY REPORT*  Clinical Data: Altered mental status  PORTABLE CHEST - 1 VIEW  Comparison: 06/12/2012, 04/16/2012  Findings: Background hyperinflation compatible with emphysema. Mild cardiac enlarged without CHF.  Nodular densities bilaterally, more pronounced on the left side.  These correlate with numerous bilateral pleural nodules by chest CT comparison.  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  Negative for focal pneumonia, collapse, consolidation, edema, effusion or pneumothorax.  Trachea midline.  IMPRESSION: Cardiomegaly without CHF or pneumonia  Bilateral nodular  densities demonstrated to be pleural nodules by prior chest CT.  See above comment.   Original Report Authenticated By: Judie Petit. Miles Costain, M.D.     Date: 07/24/2012  Rate: 68  Rhythm: normal sinus rhythm  QRS Axis: normal  Intervals: normal  ST/T Wave abnormalities: nonspecific ST/T changes  Conduction Disutrbances:first-degree A-V block   Narrative Interpretation:   Old EKG Reviewed: unchanged From 06/12/12   1. Altered mental status     CRITICAL CARE Performed by: Shelda Jakes.   Total critical care time: 30  Critical care time was exclusive of separately billable procedures and treating other patients.  Critical care was necessary to treat or prevent imminent or life-threatening deterioration.  Critical care was time spent personally by me on the following activities: development of treatment plan with patient and/or surrogate as well as nursing, discussions with consultants, evaluation of patient's response to treatment, examination of patient, obtaining history from patient or surrogate, ordering and performing treatments and interventions, ordering and review of laboratory studies,  ordering and review of radiographic studies, pulse oximetry and re-evaluation of patient's condition.   MDM   Patient from assisted living. Patient found unresponsive not arousable patient was noted to be okay at breakfast and at lunch. Similar event about a month ago with a questionable TIA or seizure but nothing ever witnessed. Patient did eventually wake up and improve.  According to patient's family he is a DO NOT RESUSCITATE visual paperwork has not been completed but that had that discussion with the nursing facility. Workup head CT shows no obvious abnormalities no evidence of head bleed. Patient has been seen moving all 4 extremities now initially he wasn't moving the right arm. Limited movement of both lower trimming these. Patient will probably at times will open his eyes. No evidence of pneumonia no evidence leukocytosis no fever no evidence urinary tract infection. Patient said gag is intact he bites down on the suction catheter. Blood gas does show a mild acidosis mild hypercapnia this could reflect a seizure-like activity that occurred. Will need admission we'll discuss with hospitalist team have considered of starting on BiPAP but she does levels are good.  Lactate is not elevated to suggest sepsis. As mentioned urine does not consistent with infection chest x-ray not consistent with infection. Blood cultures have been drawn. Also no fever. Narcan was attempted without any changes in his mental status. From cardiac standpoint EKG without acute changes. Troponin is also negative.      I personally performed the services described in this documentation, which was scribed in my presence. The recorded information has been reviewed and is accurate.     Shelda Jakes, MD 07/24/12 1654  Shelda Jakes, MD 07/25/12 [phone]

## 2012-07-24 NOTE — ED Notes (Addendum)
From [facility] states 1 hour ago pt. Began having AMS.  Now difficult to arouse even w/sternal rub.  Was normal LOC this AM at breakfast.  Seen in this ER 2 mos. Ago for possible TIA

## 2012-07-24 NOTE — H&P (Signed)
[facility] History and Physical  ADOM SCHOENECK ZOX:[id number] DOB: December 10, 1930 DOA: 07/24/2012   PCP: Syliva Overman, MD  Specialists: Neurology, Dr Gerilyn Pilgrim.  Chief Complaint: Altered mental status.  HPI: [name] [name] is a 78 y.o. male presents with the above symptoms since this morning. He had a similar episode approximately a month ago when he was admitted. No obvious cause was found, possibly thought to be due secondary to a seizure. He recovered spontaneously and was able to be discharged back to the assisted-living facility. He now once again presents with sudden unresponsiveness. There was no evidence of seizure-like activity witnessed at the assisted-living facility. There is no history of head injury. CT scan of the brain in the emergency room is negative for any acute pathology. The patient himself is unable to give me any clear history secondary to his altered mental status.   Review of Systems:  Apart from history of present illness, other systems negative.  Past Medical History  Diagnosis Date  . Glaucoma(365)   . Anxiety   . GERD (gastroesophageal reflux disease)   . Hyperlipidemia   . Hypertension   . Chronic abdominal pain   . Mesothelioma   . 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 Chronic respiratory failure   . DM type 2 (diabetes mellitus, type 2) 06/13/2012  . Altered mental status 06/12/2012    Unwitnessed isolated seizure suspected, but because of the EEG being normal, Dr. Gerilyn Pilgrim did not recommend antiseizure therapy.  . Hard of hearing   . Seizure 06/14/2012    Isolated seizure suspected, but not confirmed.   Past Surgical History  Procedure Date  . Hernia repair   . Prostate surgery   . Right eye laser surgery 2005  . Left eye surgery for glaucoma at [facility] 10/2009   Social History:  reports that he has quit smoking. He does not have any smokeless tobacco history on file. He reports that he does not drink alcohol  or use illicit drugs.  No Known Allergies  Family History  Problem Relation Age of Onset  . Lung cancer Sister       Prior to Admission medications   Medication Sig Start Date End Date Taking? Authorizing Provider  acetaZOLAMIDE (DIAMOX) 250 MG tablet Take 250 mg by mouth 4 (four) times daily -  with meals and at bedtime. Take one tablet by mouth with meals and at bedtime   Yes Historical Provider, MD  albuterol (PROVENTIL) (2.5 MG/3ML) 0.083% nebulizer solution Take 2.5 mg by nebulization 4 (four) times daily.   Yes Historical Provider, MD  amLODipine (NORVASC) 2.5 MG tablet Take 1 tablet (2.5 mg total) by mouth daily. 07/18/12 07/18/13 Yes Kerri Perches, MD  brimonidine (ALPHAGAN P) 0.1 % SOLN Place 1 drop into both eyes at bedtime. One drop in both eyes at bedtime   Yes Historical Provider, MD  clonazePAM (KLONOPIN) 1 MG tablet Take 0.5-1 mg by mouth 3 (three) times daily. 1/2 tab in the am and 1/2 in the PM and 1 at bedtime 02/13/12  Yes Kerri Perches, MD  Dorzolamide HCl-Timolol Mal PF 22.3-6.8 MG/ML SOLN Apply 1 drop to eye 2 (two) times daily.    Yes Historical Provider, MD  hydrOXYzine (ATARAX) 25 MG tablet Take 25 mg by mouth at bedtime.     Yes Historical Provider, MD  latanoprost (XALATAN) 0.005 % ophthalmic solution Place 1 drop into the left eye at bedtime.    Yes Historical Provider, MD  lovastatin (MEVACOR) 40 MG tablet Take 40 mg by mouth at bedtime.     Yes Historical Provider, MD  metFORMIN (GLUCOPHAGE) 500 MG tablet Take 1 tablet (500 mg total) by mouth 2 (two) times daily with a meal. 04/16/12 04/16/13 Yes Kerri Perches, MD  montelukast (SINGULAIR) 10 MG tablet Take 10 mg by mouth at bedtime.     Yes Historical Provider, MD  Multiple Vitamins-Minerals (MULTIVITAMIN WITH MINERALS) tablet Take 1 tablet by mouth daily. 06/14/12  Yes Elliot Cousin, MD  omeprazole (PRILOSEC) 20 MG capsule Take 20 mg by mouth daily.   Yes Historical Provider, MD  oxybutynin (DITROPAN-XL) 5  MG 24 hr tablet Take 5 mg by mouth daily.     Yes Historical Provider, MD  pilocarpine (PILOCAR) 1 % ophthalmic solution Place 1 % into both eyes Daily. 07/14/12  Yes Historical Provider, MD  risperiDONE (RISPERDAL) 0.25 MG tablet Take 0.25 mg by mouth at bedtime.     Yes Historical Provider, MD  Rivastigmine (EXELON) 13.3 MG/24HR PT24 Place 1 patch onto the skin daily. 04/16/12  Yes Kerri Perches, MD  senna-docusate (SENOKOT-S) 8.6-50 MG per tablet Take 1 tablet by mouth 2 (two) times daily. 06/14/12  Yes Elliot Cousin, MD  Vitamin D, Ergocalciferol, (DRISDOL) 50000 UNITS CAPS Take 50,000 Units by mouth every 7 (seven) days.   Yes Historical Provider, MD   Physical Exam: Filed Vitals:   07/24/12 1418 07/24/12 1504 07/24/12 1700  BP: 105/68  116/81  Pulse: 69  60  Temp:   96.7 F (35.9 C)  Resp:   16  SpO2: 69% 100% 100%     General:  He is very drowsy. Appears to be moving his arms and legs. Does not open his eyes to his name. Does respond to painful stimuli.  Eyes: No pallor. No jaundice.  ENT: No obvious abnormalities.  Neck: No neck lymphadenopathy.  Cardiovascular: Heart sounds are present and normal without murmurs or gallop rhythm.  Respiratory: Lung fields are clear.  Abdomen: Soft, nontender, no masses. No hepatosplenomegaly.  Skin: No obvious rash.  Musculoskeletal: No joint effusions.  Psychiatric: Not examined.  Neurologic: Moving all limbs. Plantars are downgoing. No obvious localizing signs.  Labs on Admission:  Basic Metabolic Panel:  Lab 07/24/12 1191  NA 138  K 4.4  CL 105  CO2 25  GLUCOSE 114*  BUN 15  CREATININE 1.09  CALCIUM 10.9*  MG --  PHOS --   Liver Function Tests:  Lab 07/24/12 1514  AST 15  ALT 9  ALKPHOS 81  BILITOT 0.2*  PROT 8.4*  ALBUMIN 3.9    Lab 07/24/12 1514  LIPASE 38  AMYLASE --    CBC:  Lab 07/24/12 1514  WBC 8.0  NEUTROABS 5.2  HGB 10.8*  HCT 37.2*  MCV 91.0  PLT 173   Cardiac Enzymes:  Lab  07/24/12 1514  CKTOTAL --  CKMB --  CKMBINDEX --  TROPONINI <0.30      CBG:  Lab 07/24/12 1706 07/24/12 1503  GLUCAP 137* 107*    Radiological Exams on Admission: Ct Head Wo Contrast  07/24/2012  *RADIOLOGY REPORT*  Clinical Data: Altered mental status  CT HEAD WITHOUT CONTRAST  Technique:  Contiguous axial images were obtained from the base of the skull through the vertex without contrast.  Comparison: Brain MRI 06/13/2012  Findings: No skull fracture is noted.  Paranasal sinuses and mastoid air cells are unremarkable.  No intracranial hemorrhage, mass effect or midline shift.  Mild cerebral  atrophy.  Stable periventricular and patchy subcortical chronic white matter disease.  No definite acute cortical infarction.  No mass lesion is noted on this unenhanced scan.  IMPRESSION: No acute intracranial abnormality.  No definite acute cortical infarction.  Stable cerebral atrophy.  Stable chronic white matter disease.   Original Report Authenticated By: Natasha Mead, M.D.    Dg Chest Portable 1 View  07/24/2012  *RADIOLOGY REPORT*  Clinical Data: Altered mental status  PORTABLE CHEST - 1 VIEW  Comparison: 06/12/2012, 04/16/2012  Findings: Background hyperinflation compatible with emphysema. Mild cardiac enlarged without CHF.  Nodular densities bilaterally, more pronounced on the left side.  These correlate with numerous bilateral pleural nodules by chest CT comparison.  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  Negative for focal pneumonia, collapse, consolidation, edema, effusion or pneumothorax.  Trachea midline.  IMPRESSION: Cardiomegaly without CHF or pneumonia  Bilateral nodular densities demonstrated to be pleural nodules by prior chest CT.  See above comment.   Original Report Authenticated By: Judie Petit. Miles Costain, M.D.       Assessment/Plan   1. Altered mental status, unclear etiology, possibly patient is now post ictal. 2. History of dementia with delusional features.   3. Hypertension.  Plan: 1. Admit to step down unit for close monitoring. 2. Monitor neurologically. 3. Neurology consultation. Further recommendations will depend on patient's hospital progress.  Code Status: DO NOT RESUSCITATE per family members at the bedside.   Family Communication: Discussed plan with patient's daughter is at the bedside.  Disposition Plan: Back to the assisted living facility when medically stable.   Time spent: 45 minutes.  Wilson Singer [facility] Pager [phone].  If 7PM-7AM, please contact night-coverage [email or web address] Password [facility] 07/24/2012, 5:23 PM

## 2012-07-25 ENCOUNTER — Inpatient Hospital Stay (HOSPITAL_COMMUNITY)
Admit: 2012-07-25 | Discharge: 2012-07-25 | Disposition: A | Discharge status: Home / Self Care | Payer: PRIVATE HEALTH INSURANCE | Attending: Neurology | Admitting: Neurology

## 2012-07-25 DIAGNOSIS — F411 Generalized anxiety disorder: Secondary | ICD-10-CM

## 2012-07-25 LAB — COMPREHENSIVE METABOLIC PANEL
Alkaline Phosphatase: 70 U/L (ref 39–117)
BUN: 14 mg/dL (ref 6–23)
CO2: 25 mEq/L (ref 19–32)
Calcium: 10.4 mg/dL (ref 8.4–10.5)
GFR calc Af Amer: 73 mL/min — ABNORMAL LOW (ref >90)
GFR calc non Af Amer: 63 mL/min — ABNORMAL LOW (ref >90)
Glucose, Bld: 109 mg/dL — ABNORMAL HIGH (ref 70–99)
Potassium: 4 mEq/L (ref 3.5–5.1)
Total Protein: 7.3 g/dL (ref 6.0–8.3)

## 2012-07-25 LAB — CBC
HCT: 36.1 % — ABNORMAL LOW (ref 39.0–52.0)
MCHC: 29.1 g/dL — ABNORMAL LOW (ref 30.0–36.0)
Platelets: 171 10*3/uL (ref 150–400)
RDW: 15.3 % (ref 11.5–15.5)
WBC: 7.9 10*3/uL (ref 4.0–10.5)

## 2012-07-25 MED ORDER — IPRATROPIUM BROMIDE 0.02 % IN SOLN
0.5000 mg | Freq: Four times a day (QID) | RESPIRATORY_TRACT | Status: DC
  Administered 2012-07-25 – 2012-07-28 (×10): 0.5 mg via RESPIRATORY_TRACT
  Filled 2012-07-25 (×10): qty 2.5

## 2012-07-25 MED ORDER — ALBUTEROL SULFATE (5 MG/ML) 0.5% IN NEBU
2.5000 mg | INHALATION_SOLUTION | Freq: Four times a day (QID) | RESPIRATORY_TRACT | Status: DC
  Administered 2012-07-25 – 2012-07-28 (×10): 2.5 mg via RESPIRATORY_TRACT
  Filled 2012-07-25 (×10): qty 0.5

## 2012-07-25 MED ORDER — FLEET ENEMA 7-19 GM/118ML RE ENEM
1.0000 | ENEMA | Freq: Every day | RECTAL | Status: DC | PRN
  Administered 2012-07-28: 1 via RECTAL

## 2012-07-25 MED ORDER — ACETAMINOPHEN 325 MG PO TABS
650.0000 mg | ORAL_TABLET | Freq: Four times a day (QID) | ORAL | Status: DC | PRN
  Administered 2012-07-25: 650 mg via ORAL
  Filled 2012-07-25: qty 2

## 2012-07-25 MED ORDER — AMLODIPINE BESYLATE 5 MG PO TABS: 2.5000 mg | ORAL_TABLET | Freq: Every day | ORAL | Status: DC

## 2012-07-25 MED ORDER — RIVASTIGMINE 4.6 MG/24HR TD PT24
13.3000 mg | MEDICATED_PATCH | Freq: Every day | TRANSDERMAL | Status: DC
  Administered 2012-07-25 – 2012-07-28 (×4): 13.8 mg via TRANSDERMAL
  Filled 2012-07-25 (×6): qty 3

## 2012-07-25 NOTE — Clinical Social Work Psychosocial (Signed)
     Clinical Social Work Department BRIEF PSYCHOSOCIAL ASSESSMENT 07/25/2012  Patient:  [name] [name], [name] [name]     Account Number:  [id number]     Admit date:  07/24/2012  Clinical Social Worker:  Santa Genera, CLINICAL SOCIAL WORKER  Date/Time:  07/25/2012 10:00 AM  Referred by:  CSW  Date Referred:  07/25/2012 Referred for  ALF Placement   Other Referral:   Interview type:  Family Other interview type:   Also spoke w Darrall Dears, facility administrator    PSYCHOSOCIAL DATA Living Status:  FACILITY Admitted from facility:  [facility] Level of care:  Assisted Living Primary support name:  Hector Shade Primary support relationship to patient:  CHILD, ADULT Degree of support available:   Significant    CURRENT CONCERNS Current Concerns  Post-Acute Placement   Other Concerns:    SOCIAL WORK ASSESSMENT / PLAN CSW met w two of patient's daughters at bedside, there are 8 siblings in total.  Patient unable to fully participate in assessment and appeared groggy.  Per daughters, patient has lived at [facility] for 10 years, family and patient are happy w this arrangement.  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  Children and grandchildren live in [place].    Patient diagnosed w pleural plaques in August 2013, has been onO2 continuously since then.  Patient followed by PCP Syliva Overman MD, receives O2 and home health serviced from [facility].  Currently has hospital bed and O2 from [facility].  At [facility] has been total assist for bathing, toileting and dressing.  Incontinent of bowel and bladder. Can transfer and amublates w walker or cane.  Gets self to bathroom, but wears PullUps continuously due to incontinence.  Does not require assistance w eating, other than having food cut up when necessary.    Patient ate breakfast in dining area yesterday, then went back to bed.  When staff checked on him, he was unresponsive.  EMS  transported to [facility], resulting in current admission to find cause of unresponsiveness.  Per daughter, patient has previous hospitalization two months ago for same issue.  These episodes occur with no prior period of decline - patient seemed his usual self at breakfast but was later found unresponsive in his bed.    Spoke w Darrall Dears, facility administrator.  She has recommended that patient move to [facility]).  This is another one of her facilities, but has awake staff 24 hours/day.  This will allow greater supervision of patient. Family is in agreement w moving patient to [facility] at discharge.    CSW will submit PASARR screen and complete FL2.  Will keep facility updated.  Family states they are willing to transport patient to [facility] at discharge if he does not require EMS.  Facility says they will contact [facility] to get patient's hospital bed and O2 transferred to the [facility] facility prior to discharge.  Facility will take patient back whenever ready for discharge.   Assessment/plan status:  Psychosocial Support/Ongoing Assessment of Needs Other assessment/ plan:   Information/referral to community resources:   None needed    PATIENTS/FAMILYS RESPONSE TO PLAN OF CARE: Appreciative.

## 2012-07-25 NOTE — Progress Notes (Signed)
Patient's remaining PO medications to be administered are to be HELD as was the initial plan for this RN. -Dr. Orvan Falconer contact in order to receive affirmation/endorsement for holding medication, endorsement received Patient continues to rest in no apparent distress.  -somnolent but responds to noxious stimuli -vitals remain stable and within defined limits

## 2012-07-25 NOTE — Progress Notes (Signed)
Subjective: This man is more awake this morning. He responds appropriately to commands. He remains somewhat confused, I wonder whether this is his baseline state.           Physical Exam: Blood pressure 91/61, pulse 64, temperature 97.9 F (36.6 C), temperature source Oral, resp. rate 14, height 5\' 6"  (1.676 m), weight 76.6 kg (168 lb 14 oz), SpO2 99.00%. Alert intermittently. Heart sounds are present and normal. Lung fields are clear. There are no focal neurological signs.   Investigations:  Recent Results (from the past 240 hour(s))  CULTURE, BLOOD (ROUTINE X 2)     Status: Normal (Preliminary result)   Collection Time   07/24/12  3:15 PM      Component Value Range Status Comment   Specimen Description BLOOD RIGHT ANTECUBITAL   Final    Special Requests     Final    Value: BOTTLES DRAWN AEROBIC AND ANAEROBIC AEB=8CC ANA=6CC   Culture NO GROWTH 1 DAY   Final    Report Status PENDING   Incomplete   CULTURE, BLOOD (ROUTINE X 2)     Status: Normal (Preliminary result)   Collection Time   07/24/12  3:19 PM      Component Value Range Status Comment   Specimen Description BLOOD LEFT HAND DRAWN BY RN   Final    Special Requests BOTTLES DRAWN AEROBIC ONLY 5CC   Final    Culture NO GROWTH 1 DAY   Final    Report Status PENDING   Incomplete   MRSA PCR SCREENING     Status: Normal   Collection Time   07/24/12  6:42 PM      Component Value Range Status Comment   MRSA by PCR NEGATIVE  NEGATIVE Final      Basic Metabolic Panel:  Basename 07/25/12 0510 07/24/12 1514  NA 140 138  K 4.0 4.4  CL 108 105  CO2 25 25  GLUCOSE 109* 114*  BUN 14 15  CREATININE 1.09 1.09  CALCIUM 10.4 10.9*  MG -- --  PHOS -- --   Liver Function Tests:  [facility] 07/25/12 0510 07/24/12 1514  AST 16 15  ALT 8 9  ALKPHOS 70 81  BILITOT 0.2* 0.2*  PROT 7.3 8.4*  ALBUMIN 3.3* 3.9     CBC:  Basename 07/25/12 0510 07/24/12 1514  WBC 7.9 8.0  NEUTROABS -- 5.2  HGB 10.5* 10.8*    HCT 36.1* 37.2*  MCV 90.0 91.0  PLT 171 173    Ct Head Wo Contrast  07/24/2012  *RADIOLOGY REPORT*  Clinical Data: Altered mental status  CT HEAD WITHOUT CONTRAST  Technique:  Contiguous axial images were obtained from the base of the skull through the vertex without contrast.  Comparison: Brain MRI 06/13/2012  Findings: No skull fracture is noted.  Paranasal sinuses and mastoid air cells are unremarkable.  No intracranial hemorrhage, mass effect or midline shift.  Mild cerebral atrophy.  Stable periventricular and patchy subcortical chronic white matter disease.  No definite acute cortical infarction.  No mass lesion is noted on this unenhanced scan.  IMPRESSION: No acute intracranial abnormality.  No definite acute cortical infarction.  Stable cerebral atrophy.  Stable chronic white matter disease.   Original Report Authenticated By: Natasha Mead, M.D.    Dg Chest Portable 1 View  07/24/2012  *RADIOLOGY REPORT*  Clinical Data: Altered mental status  PORTABLE CHEST - 1 VIEW  Comparison: 06/12/2012, 04/16/2012  Findings: Background hyperinflation compatible with emphysema. Mild  cardiac enlarged without CHF.  Nodular densities bilaterally, more pronounced on the left side.  These correlate with numerous bilateral pleural nodules by chest CT comparison.  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  Negative for focal pneumonia, collapse, consolidation, edema, effusion or pneumothorax.  Trachea midline.  IMPRESSION: Cardiomegaly without CHF or pneumonia  Bilateral nodular densities demonstrated to be pleural nodules by prior chest CT.  See above comment.   Original Report Authenticated By: Judie Petit. Miles Costain, M.D.       Medications: I have reviewed the patient's current medications.  Impression: 1. Altered mental status, unclear etiology, possibly seizure. 2. 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 3. History of hypertension, currently blood pressure is somewhat low.     Plan: 1.  Continue supportive measures. 2. Await neurology consultation. 3. Start a diet.     LOS: 1 day   Wilson Singer Pager [phone]  07/25/2012, 7:43 AM

## 2012-07-25 NOTE — Consult Note (Signed)
[facility] [name] [name] A. [name] Pilgrim, MD     [email or web address]          [name] [name] is an 78 y.o. male.  HPI:   Past Medical History  Diagnosis Date  . Glaucoma(365)   . Anxiety   . GERD (gastroesophageal reflux disease)   . Hyperlipidemia   . Hypertension   . Chronic abdominal pain   . Mesothelioma   . 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 Chronic respiratory failure   . DM type 2 (diabetes mellitus, type 2) 06/13/2012  . Altered mental status 06/12/2012    Unwitnessed isolated seizure suspected, but because of the EEG being normal, Dr. Gerilyn [name] did not recommend antiseizure therapy.  . Hard of hearing   . Seizure 06/14/2012    Isolated seizure suspected, but not confirmed.    Past Surgical History  Procedure Date  . Hernia repair   . Prostate surgery   . Right eye laser surgery 2005  . Left eye surgery for glaucoma at [facility] 10/2009    Family History  Problem Relation Age of Onset  . Lung cancer Sister     Social History:  reports that he has quit smoking. He does not have any smokeless tobacco history on file. He reports that he does not drink alcohol or use illicit drugs.  Allergies: No Known Allergies  Medications:  Prior to Admission medications   Medication Sig Start Date End Date Taking? Authorizing Provider  acetaZOLAMIDE (DIAMOX) 250 MG tablet Take 250 mg by mouth 4 (four) times daily -  with meals and at bedtime. Take one tablet by mouth with meals and at bedtime   Yes Historical Provider, MD  albuterol (PROVENTIL) (2.5 MG/3ML) 0.083% nebulizer solution Take 2.5 mg by nebulization 4 (four) times daily.   Yes Historical Provider, MD  amLODipine (NORVASC) 2.5 MG tablet Take 1 tablet (2.5 mg total) by mouth daily. 07/18/12 07/18/13 Yes Kerri Perches, MD  brimonidine (ALPHAGAN P) 0.1 % SOLN Place 1 drop into both eyes at bedtime. One drop in both eyes at bedtime   Yes Historical Provider, MD  clonazePAM (KLONOPIN) 1 MG  tablet Take 0.5-1 mg by mouth 3 (three) times daily. 1/2 tab in the am and 1/2 in the PM and 1 at bedtime 02/13/12  Yes Kerri Perches, MD  Dorzolamide HCl-Timolol Mal PF 22.3-6.8 MG/ML SOLN Apply 1 drop to eye 2 (two) times daily.    Yes Historical Provider, MD  hydrOXYzine (ATARAX) 25 MG tablet Take 25 mg by mouth at bedtime.     Yes Historical Provider, MD  latanoprost (XALATAN) 0.005 % ophthalmic solution Place 1 drop into the left eye at bedtime.    Yes Historical Provider, MD  lovastatin (MEVACOR) 40 MG tablet Take 40 mg by mouth at bedtime.     Yes Historical Provider, MD  metFORMIN (GLUCOPHAGE) 500 MG tablet Take 1 tablet (500 mg total) by mouth 2 (two) times daily with a meal. 04/16/12 04/16/13 Yes Kerri Perches, MD  montelukast (SINGULAIR) 10 MG tablet Take 10 mg by mouth at bedtime.     Yes Historical Provider, MD  Multiple Vitamins-Minerals (MULTIVITAMIN WITH MINERALS) tablet Take 1 tablet by mouth daily. 06/14/12  Yes Elliot Cousin, MD  omeprazole (PRILOSEC) 20 MG capsule Take 20 mg by mouth daily.   Yes Historical Provider, MD  oxybutynin (DITROPAN-XL) 5 MG 24 hr tablet Take 5 mg by mouth daily.     Yes Historical Provider, MD  pilocarpine (  PILOCAR) 1 % ophthalmic solution Place 1 % into both eyes Daily. 07/14/12  Yes Historical Provider, MD  risperiDONE (RISPERDAL) 0.25 MG tablet Take 0.25 mg by mouth at bedtime.     Yes Historical Provider, MD  Rivastigmine (EXELON) 13.3 MG/24HR PT24 Place 1 patch onto the skin daily. 04/16/12  Yes Kerri Perches, MD  senna-docusate (SENOKOT-S) 8.6-50 MG per tablet Take 1 tablet by mouth 2 (two) times daily. 06/14/12  Yes Elliot Cousin, MD  Vitamin D, Ergocalciferol, (DRISDOL) 50000 UNITS CAPS Take 50,000 Units by mouth every 7 (seven) days.   Yes Historical Provider, MD   Scheduled Meds:   . brimonidine  1 drop Both Eyes QHS  . dorzolamide  1 drop Both Eyes BID   And  . timolol  1 drop Both Eyes BID  . heparin  5,000 Units Subcutaneous Q8H    . hydrOXYzine  25 mg Oral QHS  . latanoprost  1 drop Left Eye QHS  . [COMPLETED] naloxone      . risperiDONE  0.25 mg Oral QHS  . rivastigmine  13.8 mg Transdermal Daily  . senna-docusate  1 tablet Oral BID  . [COMPLETED] sodium chloride  250 mL Intravenous Once  . sodium chloride  3 mL Intravenous Q12H  . [DISCONTINUED] acetaZOLAMIDE  250 mg Oral TID WC & HS  . [DISCONTINUED] amLODipine  2.5 mg Oral Daily  . [DISCONTINUED] amLODipine  2.5 mg Oral Daily  . [DISCONTINUED] brimonidine  1 drop Both Eyes QHS  . [DISCONTINUED] Dorzolamide HCl-Timolol Mal PF  1 drop Ophthalmic BID  . [DISCONTINUED] Dorzolamide HCl-Timolol Mal PF  1 drop Ophthalmic BID  . [DISCONTINUED] metFORMIN  500 mg Oral BID WC  . [DISCONTINUED] montelukast  10 mg Oral QHS  . [DISCONTINUED] oxybutynin  5 mg Oral Daily  . [DISCONTINUED] pantoprazole  40 mg Oral Daily  . [DISCONTINUED] pilocarpine  1 drop Both Eyes BID  . [DISCONTINUED] Rivastigmine  1 patch Transdermal Daily  . [DISCONTINUED] simvastatin  20 mg Oral q1800   Continuous Infusions:   . sodium chloride 50 mL/hr at 07/24/12 2345  . [DISCONTINUED] sodium chloride     PRN Meds:.albuterol, ondansetron (ZOFRAN) IV, ondansetron   Results for orders placed during the hospital encounter of 07/24/12 (from the past 48 hour(s))  GLUCOSE, CAPILLARY     Status: Abnormal   Collection Time   07/24/12  3:03 PM      Component Value Range Comment   Glucose-Capillary 107 (*) 70 - 99 mg/dL   URINALYSIS, ROUTINE W REFLEX MICROSCOPIC     Status: Abnormal   Collection Time   07/24/12  3:12 PM      Component Value Range Comment   Color, Urine YELLOW  YELLOW    APPearance CLEAR  CLEAR    Specific Gravity, Urine >1.030 (*) 1.005 - 1.030    pH 6.0  5.0 - 8.0    Glucose, UA NEGATIVE  NEGATIVE mg/dL    Hgb urine dipstick NEGATIVE  NEGATIVE    Bilirubin Urine NEGATIVE  NEGATIVE    Ketones, ur NEGATIVE  NEGATIVE mg/dL    Protein, ur NEGATIVE  NEGATIVE mg/dL     Urobilinogen, UA 0.2  0.0 - 1.0 mg/dL    Nitrite NEGATIVE  NEGATIVE    Leukocytes, UA NEGATIVE  NEGATIVE MICROSCOPIC NOT DONE ON URINES WITH NEGATIVE PROTEIN, BLOOD, LEUKOCYTES, NITRITE, OR GLUCOSE <1000 mg/dL.  CBC WITH DIFFERENTIAL     Status: Abnormal   Collection Time   07/24/12  3:14  PM      Component Value Range Comment   WBC 8.0  4.0 - 10.5 K/uL    RBC 4.09 (*) 4.22 - 5.81 MIL/uL    Hemoglobin 10.8 (*) 13.0 - 17.0 g/dL    HCT 16.1 (*) 09.6 - 52.0 %    MCV 91.0  78.0 - 100.0 fL    MCH 26.4  26.0 - 34.0 pg    MCHC 29.0 (*) 30.0 - 36.0 g/dL    RDW 04.5  40.9 - 81.1 %    Platelets 173  150 - 400 K/uL    Neutrophils Relative 65  43 - 77 %    Neutro Abs 5.2  1.7 - 7.7 K/uL    Lymphocytes Relative 26  12 - 46 %    Lymphs Abs 2.1  0.7 - 4.0 K/uL    Monocytes Relative 7  3 - 12 %    Monocytes Absolute 0.6  0.1 - 1.0 K/uL    Eosinophils Relative 2  0 - 5 %    Eosinophils Absolute 0.2  0.0 - 0.7 K/uL    Basophils Relative 0  0 - 1 %    Basophils Absolute 0.0  0.0 - 0.1 K/uL   COMPREHENSIVE METABOLIC PANEL     Status: Abnormal   Collection Time   07/24/12  3:14 PM      Component Value Range Comment   Sodium 138  135 - 145 mEq/L    Potassium 4.4  3.5 - 5.1 mEq/L    Chloride 105  96 - 112 mEq/L    CO2 25  19 - 32 mEq/L    Glucose, Bld 114 (*) 70 - 99 mg/dL    BUN 15  6 - 23 mg/dL    Creatinine, Ser 9.14  0.50 - 1.35 mg/dL    Calcium 78.2 (*) 8.4 - 10.5 mg/dL    Total Protein 8.4 (*) 6.0 - 8.3 g/dL    Albumin 3.9  3.5 - 5.2 g/dL    AST 15  0 - 37 U/L    ALT 9  0 - 53 U/L    Alkaline Phosphatase 81  39 - 117 U/L    Total Bilirubin 0.2 (*) 0.3 - 1.2 mg/dL    GFR calc non Af Amer 63 (*) >90 mL/min    GFR calc Af Amer 73 (*) >90 mL/min   LIPASE, BLOOD     Status: Normal   Collection Time   07/24/12  3:14 PM      Component Value Range Comment   Lipase 38  11 - 59 U/L   PROTIME-INR     Status: Normal   Collection Time   07/24/12  3:14 PM      Component Value Range Comment    Prothrombin Time 12.4  11.6 - 15.2 seconds    INR 0.93  0.00 - 1.49   TROPONIN I     Status: Normal   Collection Time   07/24/12  3:14 PM      Component Value Range Comment   Troponin I <0.30  <0.30 ng/mL   LACTIC ACID, PLASMA     Status: Normal   Collection Time   07/24/12  3:15 PM      Component Value Range Comment   Lactic Acid, Venous 1.8  0.5 - 2.2 mmol/L   CULTURE, BLOOD (ROUTINE X 2)     Status: Normal (Preliminary result)   Collection Time   07/24/12  3:15 PM  Component Value Range Comment   Specimen Description BLOOD RIGHT ANTECUBITAL      Special Requests        Value: BOTTLES DRAWN AEROBIC AND ANAEROBIC AEB=8CC ANA=6CC   Culture NO GROWTH 1 DAY      Report Status PENDING     BLOOD GAS, ARTERIAL     Status: Abnormal   Collection Time   07/24/12  3:15 PM      Component Value Range Comment   O2 Content 2.0      pH, Arterial 7.258 (*) 7.350 - 7.450    pCO2 arterial 48.3 (*) 35.0 - 45.0 mmHg    pO2, Arterial 114.0 (*) 80.0 - 100.0 mmHg    Bicarbonate 20.8  20.0 - 24.0 mEq/L    TCO2 19.8  0 - 100 mmol/L    Acid-base deficit 5.1 (*) 0.0 - 2.0 mmol/L    O2 Saturation 97.8      Patient temperature 37.0      Collection site RIGHT BRACHIAL      Drawn by COLLECTED BY RT      Sample type ARTERIAL      Allens test (pass/fail) PASS  PASS   CULTURE, BLOOD (ROUTINE X 2)     Status: Normal (Preliminary result)   Collection Time   07/24/12  3:19 PM      Component Value Range Comment   Specimen Description BLOOD LEFT HAND DRAWN BY RN      Special Requests BOTTLES DRAWN AEROBIC ONLY 5CC      Culture NO GROWTH 1 DAY      Report Status PENDING     GLUCOSE, CAPILLARY     Status: Abnormal   Collection Time   07/24/12  5:06 PM      Component Value Range Comment   Glucose-Capillary 137 (*) 70 - 99 mg/dL   MRSA PCR SCREENING     Status: Normal   Collection Time   07/24/12  6:42 PM      Component Value Range Comment   MRSA by PCR NEGATIVE  NEGATIVE   COMPREHENSIVE METABOLIC  PANEL     Status: Abnormal   Collection Time   07/25/12  5:10 AM      Component Value Range Comment   Sodium 140  135 - 145 mEq/L    Potassium 4.0  3.5 - 5.1 mEq/L    Chloride 108  96 - 112 mEq/L    CO2 25  19 - 32 mEq/L    Glucose, Bld 109 (*) 70 - 99 mg/dL    BUN 14  6 - 23 mg/dL    Creatinine, Ser 8.46  0.50 - 1.35 mg/dL    Calcium 96.2  8.4 - 10.5 mg/dL    Total Protein 7.3  6.0 - 8.3 g/dL    Albumin 3.3 (*) 3.5 - 5.2 g/dL    AST 16  0 - 37 U/L    ALT 8  0 - 53 U/L    Alkaline Phosphatase 70  39 - 117 U/L    Total Bilirubin 0.2 (*) 0.3 - 1.2 mg/dL    GFR calc non Af Amer 63 (*) >90 mL/min    GFR calc Af Amer 73 (*) >90 mL/min   CBC     Status: Abnormal   Collection Time   07/25/12  5:10 AM      Component Value Range Comment   WBC 7.9  4.0 - 10.5 K/uL    RBC 4.01 (*) 4.22 - 5.81 MIL/uL  Hemoglobin 10.5 (*) 13.0 - 17.0 g/dL    HCT 16.1 (*) 09.6 - 52.0 %    MCV 90.0  78.0 - 100.0 fL    MCH 26.2  26.0 - 34.0 pg    MCHC 29.1 (*) 30.0 - 36.0 g/dL    RDW 04.5  40.9 - 81.1 %    Platelets 171  150 - 400 K/uL     Ct Head Wo Contrast  07/24/2012  *RADIOLOGY REPORT*  Clinical Data: Altered mental status  CT HEAD WITHOUT CONTRAST  Technique:  Contiguous axial images were obtained from the base of the skull through the vertex without contrast.  Comparison: Brain MRI 06/13/2012  Findings: No skull fracture is noted.  Paranasal sinuses and mastoid air cells are unremarkable.  No intracranial hemorrhage, mass effect or midline shift.  Mild cerebral atrophy.  Stable periventricular and patchy subcortical chronic white matter disease.  No definite acute cortical infarction.  No mass lesion is noted on this unenhanced scan.  IMPRESSION: No acute intracranial abnormality.  No definite acute cortical infarction.  Stable cerebral atrophy.  Stable chronic white matter disease.   Original Report Authenticated By: Natasha Mead, M.D.    Dg Chest Portable 1 View  07/24/2012  *RADIOLOGY REPORT*   Clinical Data: Altered mental status  PORTABLE CHEST - 1 VIEW  Comparison: 06/12/2012, 04/16/2012  Findings: Background hyperinflation compatible with emphysema. Mild cardiac enlarged without CHF.  Nodular densities bilaterally, more pronounced on the left side.  These correlate with numerous bilateral pleural nodules by chest CT comparison.  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  Negative for focal pneumonia, collapse, consolidation, edema, effusion or pneumothorax.  Trachea midline.  IMPRESSION: Cardiomegaly without CHF or pneumonia  Bilateral nodular densities demonstrated to be pleural nodules by prior chest CT.  See above comment.   Original Report Authenticated By: Judie Petit. Miles Costain, M.D.     Review of Systems  Unable to perform ROS: dementia   Blood pressure 91/61, pulse 64, temperature 97.9 F (36.6 C), temperature source Oral, resp. rate 14, height 5\' 6"  (1.676 m), weight 76.6 kg (168 lb 14 oz), SpO2 99.00%. Physical Exam  Assessment/Plan: 1. Recurrent episodes of unresponsiveness lasting for several hours with spontaneous reversal to baseline. Certainly, the semiology is possibly worse on for unwitnessed seizure with spontaneous recovery. Medication effect however is a concerning given that he is on several psychotropic medications. Recurrent episodes of unresponsiveness that resolves spontaneously cannot be seen with Lewy body dementia. However, at this point in time I do not believe the patient has a criteria for low body dementia. His dementia seems more vascular.  I recommend a repeat EEG. I would also greatly diminished and/or discontinuing his psychotropic medications. He is on clonazepam several pills a day. This should be reduced to no more than 0.5 mg daily. We also can discontinue the respirdal. The family does not indicate having. The patient having significant pain with disruption. He also is on Diamox. The reasons are unclear at this time. We may consider discontinuing this medication  depending on what is being used for.     The patient is a 78 year old black male who presents with an another event of unresponsiveness. The patient states as an adult care facility where he was found unresponsive. He was taken to the emergency room for further evaluation. The patient's daughter is present here today reports that he remained unresponsive for several hours yesterday. Certainly, this morning he awoke and was at baseline. The patient had a similar event  about a month ago. The workup was essentially unrevealing. The workup at that time including imaging and EEG. I did see the patient at a time and felt that the patient may have had a unwitnessed seizure. Patient does have a history of dementia. The impression was that the patient most likely has vascular dementia.     GENERAL: The patient is awake and in no acute distress.  HEENT:  He is quite severely hearing impaired. He does have bilateral hearing aids in.  ABDOMEN: soft  EXTREMITIES: No edema. There is a small amount of negative myoclonus/flattening of the extremities bilaterally-upper extremities.   BACK:  Unremarkable.  SKIN: Normal by inspection.    MENTAL STATUS:  He is awake and lucid today. He notes that he is not reasonable at [facility]. He is not oriented to time however. He follows commands well. Speech is normal.   CRANIAL NERVES: Pupils are equal, round and reactive to light and accomodation; extra ocular movements are full, there is no significant nystagmus; visual fields are full; upper and lower facial muscles are normal in strength and symmetric, there is no flattening of the nasolabial folds; tongue is midline; uvula is midline; shoulder elevation is normal.  MOTOR: Normal tone, bulk and strength; no pronator drift.  COORDINATION: Left finger to nose is normal, right finger to nose is normal, No rest tremor; no intention tremor; no postural tremor; no bradykinesia.  REFLEXES: Deep tendon reflexes  are symmetrical and normal. Babinski reflexes are flexor bilaterally.   SENSATION: Normal to light touch and pain.    Temprance Wyre 07/25/2012, 8:49 AM

## 2012-07-25 NOTE — Progress Notes (Signed)
EEG completed at bedside as ordered °

## 2012-07-25 NOTE — Progress Notes (Signed)
UR Chart Review Completed  

## 2012-07-26 DIAGNOSIS — R7309 Other abnormal glucose: Secondary | ICD-10-CM

## 2012-07-26 DIAGNOSIS — K219 Gastro-esophageal reflux disease without esophagitis: Secondary | ICD-10-CM

## 2012-07-26 DIAGNOSIS — H409 Unspecified glaucoma: Secondary | ICD-10-CM

## 2012-07-26 MED ORDER — HYDROXYZINE HCL 25 MG PO TABS: 25.0000 mg | ORAL_TABLET | Freq: Every evening | ORAL | Status: DC | PRN

## 2012-07-26 MED ORDER — MONTELUKAST SODIUM 10 MG PO TABS
10.0000 mg | ORAL_TABLET | Freq: Every day | ORAL | Status: DC
  Administered 2012-07-26 – 2012-07-27 (×2): 10 mg via ORAL
  Filled 2012-07-26 (×2): qty 1

## 2012-07-26 MED ORDER — PANTOPRAZOLE SODIUM 40 MG PO TBEC
40.0000 mg | DELAYED_RELEASE_TABLET | Freq: Every day | ORAL | Status: DC
  Administered 2012-07-26 – 2012-07-28 (×3): 40 mg via ORAL
  Filled 2012-07-26 (×3): qty 1

## 2012-07-26 MED ORDER — HYDROXYZINE HCL 25 MG PO TABS: 25.0000 mg | ORAL_TABLET | Freq: Two times a day (BID) | ORAL | Status: DC | PRN

## 2012-07-26 MED ORDER — CLONAZEPAM 0.5 MG PO TABS
0.5000 mg | ORAL_TABLET | Freq: Every day | ORAL | Status: DC
  Administered 2012-07-26 – 2012-07-27 (×2): 0.5 mg via ORAL
  Filled 2012-07-26 (×2): qty 1

## 2012-07-26 MED ORDER — BRIMONIDINE TARTRATE 0.1 % OP SOLN: 1.0000 [drp] | Freq: Every day | OPHTHALMIC | Status: DC

## 2012-07-26 MED ORDER — SIMVASTATIN 20 MG PO TABS
20.0000 mg | ORAL_TABLET | Freq: Every day | ORAL | Status: DC
  Administered 2012-07-26 – 2012-07-27 (×2): 20 mg via ORAL
  Filled 2012-07-26 (×2): qty 1

## 2012-07-26 MED ORDER — METFORMIN HCL 500 MG PO TABS
500.0000 mg | ORAL_TABLET | Freq: Two times a day (BID) | ORAL | Status: DC
  Administered 2012-07-26 – 2012-07-28 (×5): 500 mg via ORAL
  Filled 2012-07-26 (×5): qty 1

## 2012-07-26 MED ORDER — RIVASTIGMINE 13.3 MG/24HR TD PT24: 1.0000 | MEDICATED_PATCH | Freq: Every day | TRANSDERMAL | Status: DC

## 2012-07-26 MED ORDER — DORZOLAMIDE HCL-TIMOLOL MAL PF 22.3-6.8 MG/ML OP SOLN: 1.0000 [drp] | Freq: Two times a day (BID) | OPHTHALMIC | Status: DC

## 2012-07-26 MED ORDER — OXYBUTYNIN CHLORIDE ER 5 MG PO TB24
5.0000 mg | ORAL_TABLET | Freq: Every day | ORAL | Status: DC
  Administered 2012-07-26 – 2012-07-28 (×3): 5 mg via ORAL
  Filled 2012-07-26 (×3): qty 1

## 2012-07-26 MED ORDER — PILOCARPINE HCL 1 % OP SOLN
1.0000 [drp] | Freq: Two times a day (BID) | OPHTHALMIC | Status: DC
  Administered 2012-07-26 – 2012-07-28 (×5): 1 [drp] via OPHTHALMIC
  Filled 2012-07-26: qty 15

## 2012-07-26 NOTE — Progress Notes (Addendum)
Chart reviewed.  Subjective: No complaints. No reported seizure activity or other problems. Objective: Vital signs in last 24 hours: Filed Vitals:   07/26/12 0400 07/26/12 0500 07/26/12 0600 07/26/12 0735  BP: 87/66 110/66 119/64   Pulse: 76 75    Temp: 98.6 F (37 C)     TempSrc: Axillary     Resp: 15 15 16    Height:      Weight:  79.3 kg (174 lb 13.2 oz)    SpO2: 98% 98%  95%   Weight change: -2.529 kg (-5 lb 9.2 oz)  Intake/Output Summary (Last 24 hours) at 07/26/12 0751 Last data filed at 07/26/12 0600  Gross per 24 hour  Intake 1512.5 ml  Output    600 ml  Net  912.5 ml   General: Asleep. Arousable. Confused. Lungs clear to auscultation bilaterally without wheeze rhonchi or rales Cardiovascular regular rate rhythm without murmurs gallops rubs Abdomen soft nontender nondistended Extremities no clubbing cyanosis or edema  Lab Results: Basic Metabolic Panel:  Lab 07/25/12 4098 07/24/12 1514  NA 140 138  K 4.0 4.4  CL 108 105  CO2 25 25  GLUCOSE 109* 114*  BUN 14 15  CREATININE 1.09 1.09  CALCIUM 10.4 10.9*  MG -- --  PHOS -- --   Liver Function Tests:  Lab 07/25/12 0510 07/24/12 1514  AST 16 15  ALT 8 9  ALKPHOS 70 81  BILITOT 0.2* 0.2*  PROT 7.3 8.4*  ALBUMIN 3.3* 3.9    Lab 07/24/12 1514  LIPASE 38  AMYLASE --   No results found for this basename: AMMONIA:2 in the last 168 hours CBC:  Lab 07/25/12 0510 07/24/12 1514  WBC 7.9 8.0  NEUTROABS -- 5.2  HGB 10.5* 10.8*  HCT 36.1* 37.2*  MCV 90.0 91.0  PLT 171 173   Cardiac Enzymes:  Lab 07/24/12 1514  CKTOTAL --  CKMB --  CKMBINDEX --  TROPONINI <0.30   BNP: No results found for this basename: PROBNP:3 in the last 168 hours D-Dimer: No results found for this basename: DDIMER:2 in the last 168 hours CBG:  Lab 07/24/12 1706 07/24/12 1503  GLUCAP 137* 107*   Hemoglobin A1C: No results found for this basename: HGBA1C in the last 168 hours Fasting Lipid Panel: No results found for  this basename: CHOL,HDL,LDLCALC,TRIG,CHOLHDL,LDLDIRECT in the last 119 hours Thyroid Function Tests: No results found for this basename: TSH,T4TOTAL,FREET4,T3FREE,THYROIDAB in the last 168 hours Coagulation:  Lab 07/24/12 1514  LABPROT 12.4  INR 0.93   Anemia Panel: No results found for this basename: VITAMINB12,FOLATE,FERRITIN,TIBC,IRON,RETICCTPCT in the last 168 hours Urine Drug Screen: Drugs of Abuse  No results found for this basename: labopia, cocainscrnur, labbenz, amphetmu, thcu, labbarb    Alcohol Level: No results found for this basename: ETH:2 in the last 168 hours Urinalysis:  Lab 07/24/12 1512  COLORURINE YELLOW  LABSPEC >1.030*  PHURINE 6.0  GLUCOSEU NEGATIVE  HGBUR NEGATIVE  BILIRUBINUR NEGATIVE  KETONESUR NEGATIVE  PROTEINUR NEGATIVE  UROBILINOGEN 0.2  NITRITE NEGATIVE  LEUKOCYTESUR NEGATIVE   Micro Results: Recent Results (from the past 240 hour(s))  CULTURE, BLOOD (ROUTINE X 2)     Status: Normal (Preliminary result)   Collection Time   07/24/12  3:15 PM      Component Value Range Status Comment   Specimen Description BLOOD RIGHT ANTECUBITAL   Final    Special Requests     Final    Value: BOTTLES DRAWN AEROBIC AND ANAEROBIC AEB=8CC ANA=6CC   Culture NO GROWTH  1 DAY   Final    Report Status PENDING   Incomplete   CULTURE, BLOOD (ROUTINE X 2)     Status: Normal (Preliminary result)   Collection Time   07/24/12  3:19 PM      Component Value Range Status Comment   Specimen Description BLOOD LEFT HAND DRAWN BY RN   Final    Special Requests BOTTLES DRAWN AEROBIC ONLY 5CC   Final    Culture NO GROWTH 1 DAY   Final    Report Status PENDING   Incomplete   MRSA PCR SCREENING     Status: Normal   Collection Time   07/24/12  6:42 PM      Component Value Range Status Comment   MRSA by PCR NEGATIVE  NEGATIVE Final    Studies/Results: Ct Head Wo Contrast  07/24/2012  *RADIOLOGY REPORT*  Clinical Data: Altered mental status  CT HEAD WITHOUT CONTRAST   Technique:  Contiguous axial images were obtained from the base of the skull through the vertex without contrast.  Comparison: Brain MRI 06/13/2012  Findings: No skull fracture is noted.  Paranasal sinuses and mastoid air cells are unremarkable.  No intracranial hemorrhage, mass effect or midline shift.  Mild cerebral atrophy.  Stable periventricular and patchy subcortical chronic white matter disease.  No definite acute cortical infarction.  No mass lesion is noted on this unenhanced scan.  IMPRESSION: No acute intracranial abnormality.  No definite acute cortical infarction.  Stable cerebral atrophy.  Stable chronic white matter disease.   Original Report Authenticated By: Natasha Mead, M.D.    Dg Chest Portable 1 View  07/24/2012  *RADIOLOGY REPORT*  Clinical Data: Altered mental status  PORTABLE CHEST - 1 VIEW  Comparison: 06/12/2012, 04/16/2012  Findings: Background hyperinflation compatible with emphysema. Mild cardiac enlarged without CHF.  Nodular densities bilaterally, more pronounced on the left side.  These correlate with numerous bilateral pleural nodules by chest CT comparison.  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  Negative for focal pneumonia, collapse, consolidation, edema, effusion or pneumothorax.  Trachea midline.  IMPRESSION: Cardiomegaly without CHF or pneumonia  Bilateral nodular densities demonstrated to be pleural nodules by prior chest CT.  See above comment.   Original Report Authenticated By: Judie Petit. Shick, M.D.    Scheduled Meds:   . albuterol  2.5 mg Nebulization QID  . brimonidine  1 drop Both Eyes QHS  . dorzolamide  1 drop Both Eyes BID   And  . timolol  1 drop Both Eyes BID  . heparin  5,000 Units Subcutaneous Q8H  . hydrOXYzine  25 mg Oral QHS  . ipratropium  0.5 mg Nebulization QID  . latanoprost  1 drop Left Eye QHS  . rivastigmine  13.8 mg Transdermal Daily  . senna-docusate  1 tablet Oral BID  . sodium chloride  3 mL Intravenous Q12H  . [DISCONTINUED]  risperiDONE  0.25 mg Oral QHS  . [DISCONTINUED] Rivastigmine  1 patch Transdermal Daily   Continuous Infusions:   . sodium chloride 50 mL/hr at 07/24/12 2345   PRN Meds:.acetaminophen, albuterol, ondansetron (ZOFRAN) IV, ondansetron, sodium phosphate Assessment/Plan: Principal Problem:  *Altered mental status: Possible seizure.   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   Will resume eyedrops. Resume metformin. Resume Klonopin at night to avoid withdrawal. Resume statin. Increase activity. Transfer to telemetry.   LOS: 2  days   Janthony Holleman L 07/26/2012, 7:51 AM

## 2012-07-27 DIAGNOSIS — I10 Essential (primary) hypertension: Secondary | ICD-10-CM

## 2012-07-27 MED ORDER — POLYETHYLENE GLYCOL 3350 17 G PO PACK
17.0000 g | PACK | Freq: Two times a day (BID) | ORAL | Status: DC
  Administered 2012-07-27 (×2): 17 g via ORAL
  Filled 2012-07-27 (×2): qty 1

## 2012-07-27 MED ORDER — BIOTENE DRY MOUTH MT LIQD
15.0000 mL | Freq: Two times a day (BID) | OROMUCOSAL | Status: DC
  Administered 2012-07-27 – 2012-07-28 (×2): 15 mL via OROMUCOSAL

## 2012-07-27 NOTE — Progress Notes (Signed)
Subjective: No complaints. No reported seizure activity or other problems. Nurses report constipation. Objective: Vital signs in last 24 hours: Filed Vitals:   07/27/12 0500 07/27/12 0518 07/27/12 0800 07/27/12 0840  BP: 123/75     Pulse: 83     Temp:   98.8 F (37.1 C)   TempSrc:   Oral   Resp: 17     Height:      Weight:  80.2 kg (176 lb 12.9 oz)    SpO2: 100%   97%   Weight change: 0.9 kg (1 lb 15.7 oz)  Intake/Output Summary (Last 24 hours) at 07/27/12 0856 Last data filed at 07/26/12 1000  Gross per 24 hour  Intake    240 ml  Output    300 ml  Net    -60 ml   General: Awake. Eating breakfast. Disoriented. Answers questions and follows commands. Lungs clear to auscultation bilaterally without wheeze rhonchi or rales Cardiovascular regular rate rhythm without murmurs gallops rubs Abdomen soft nontender slightly distended. Normal bowel sounds. Extremities no clubbing cyanosis or edema  Lab Results: Basic Metabolic Panel:  Lab 07/25/12 4098 07/24/12 1514  NA 140 138  K 4.0 4.4  CL 108 105  CO2 25 25  GLUCOSE 109* 114*  BUN 14 15  CREATININE 1.09 1.09  CALCIUM 10.4 10.9*  MG -- --  PHOS -- --   Liver Function Tests:  Lab 07/25/12 0510 07/24/12 1514  AST 16 15  ALT 8 9  ALKPHOS 70 81  BILITOT 0.2* 0.2*  PROT 7.3 8.4*  ALBUMIN 3.3* 3.9    Lab 07/24/12 1514  LIPASE 38  AMYLASE --   No results found for this basename: AMMONIA:2 in the last 168 hours CBC:  Lab 07/25/12 0510 07/24/12 1514  WBC 7.9 8.0  NEUTROABS -- 5.2  HGB 10.5* 10.8*  HCT 36.1* 37.2*  MCV 90.0 91.0  PLT 171 173   Cardiac Enzymes:  Lab 07/24/12 1514  CKTOTAL --  CKMB --  CKMBINDEX --  TROPONINI <0.30   BNP: No results found for this basename: PROBNP:3 in the last 168 hours D-Dimer: No results found for this basename: DDIMER:2 in the last 168 hours CBG:  Lab 07/24/12 1706 07/24/12 1503  GLUCAP 137* 107*   Hemoglobin A1C: No results found for this basename: HGBA1C  in the last 168 hours Fasting Lipid Panel: No results found for this basename: CHOL,HDL,LDLCALC,TRIG,CHOLHDL,LDLDIRECT in the last 119 hours Thyroid Function Tests: No results found for this basename: TSH,T4TOTAL,FREET4,T3FREE,THYROIDAB in the last 168 hours Coagulation:  Lab 07/24/12 1514  LABPROT 12.4  INR 0.93   Anemia Panel: No results found for this basename: VITAMINB12,FOLATE,FERRITIN,TIBC,IRON,RETICCTPCT in the last 168 hours Urine Drug Screen: Drugs of Abuse  No results found for this basename: labopia,  cocainscrnur,  labbenz,  amphetmu,  thcu,  labbarb    Alcohol Level: No results found for this basename: ETH:2 in the last 168 hours Urinalysis:  Lab 07/24/12 1512  COLORURINE YELLOW  LABSPEC >1.030*  PHURINE 6.0  GLUCOSEU NEGATIVE  HGBUR NEGATIVE  BILIRUBINUR NEGATIVE  KETONESUR NEGATIVE  PROTEINUR NEGATIVE  UROBILINOGEN 0.2  NITRITE NEGATIVE  LEUKOCYTESUR NEGATIVE   Micro Results: Recent Results (from the past 240 hour(s))  CULTURE, BLOOD (ROUTINE X 2)     Status: Normal (Preliminary result)   Collection Time   07/24/12  3:15 PM      Component Value Range Status Comment   Specimen Description BLOOD RIGHT ANTECUBITAL   Final    Special Requests  Final    Value: BOTTLES DRAWN AEROBIC AND ANAEROBIC AEB=8CC ANA=6CC   Culture NO GROWTH 3 DAYS   Final    Report Status PENDING   Incomplete   CULTURE, BLOOD (ROUTINE X 2)     Status: Normal (Preliminary result)   Collection Time   07/24/12  3:19 PM      Component Value Range Status Comment   Specimen Description BLOOD LEFT HAND DRAWN BY RN   Final    Special Requests BOTTLES DRAWN AEROBIC ONLY 5CC   Final    Culture NO GROWTH 3 DAYS   Final    Report Status PENDING   Incomplete   MRSA PCR SCREENING     Status: Normal   Collection Time   07/24/12  6:42 PM      Component Value Range Status Comment   MRSA by PCR NEGATIVE  NEGATIVE Final    Studies/Results: No results found. Scheduled Meds:    .  albuterol  2.5 mg Nebulization QID  . brimonidine  1 drop Both Eyes QHS  . clonazePAM  0.5 mg Oral QHS  . dorzolamide  1 drop Both Eyes BID   And  . timolol  1 drop Both Eyes BID  . heparin  5,000 Units Subcutaneous Q8H  . ipratropium  0.5 mg Nebulization QID  . latanoprost  1 drop Left Eye QHS  . metFORMIN  500 mg Oral BID WC  . montelukast  10 mg Oral QHS  . oxybutynin  5 mg Oral Daily  . pantoprazole  40 mg Oral Daily  . pilocarpine  1 drop Both Eyes BID  . rivastigmine  13.8 mg Transdermal Daily  . senna-docusate  1 tablet Oral BID  . simvastatin  20 mg Oral q1800  . sodium chloride  3 mL Intravenous Q12H   Continuous Infusions:  PRN Meds:.acetaminophen, albuterol, hydrOXYzine, ondansetron (ZOFRAN) IV, ondansetron, sodium phosphate Assessment/Plan: Principal Problem:  *Altered mental status: Possible seizure.   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   Klonopin at night to avoid withdrawal. Continue senna.  Add miralax.   LOS: 3 days   Britton Bera L 07/27/2012, 8:56 AM

## 2012-07-28 DIAGNOSIS — F0390 Unspecified dementia without behavioral disturbance: Secondary | ICD-10-CM

## 2012-07-28 DIAGNOSIS — R059 Cough, unspecified: Secondary | ICD-10-CM

## 2012-07-28 DIAGNOSIS — R05 Cough: Secondary | ICD-10-CM

## 2012-07-28 MED ORDER — RIVASTIGMINE 4.6 MG/24HR TD PT24: 1.0000 | MEDICATED_PATCH | Freq: Every day | TRANSDERMAL | Status: DC

## 2012-07-28 MED ORDER — CLONAZEPAM 0.5 MG PO TABS: 0.5000 mg | ORAL_TABLET | Freq: Every day | ORAL | Status: DC

## 2012-07-28 NOTE — Care Management Note (Addendum)
    Page 1 of 1   07/28/2012     11:46:41 AM   CARE MANAGEMENT NOTE 07/28/2012  Patient:  [name] [name], [name] [name]   Account Number:  [id number]  Date Initiated:  07/28/2012  Documentation initiated by:  Rosemary Holms  Subjective/Objective Assessment:   Pt admitted from [place] family home. To be DC'd to [facility] which has 24 hr care. Pt requests a suction device. Ordered. [facility] notified.     Action/Plan:   Anticipated DC Date:  07/28/2012   Anticipated DC Plan:  REST HOME  In-house referral  Clinical Social Worker      DC Planning Services  CM consult      Choice offered to / List presented to:     DME arranged  SUCTION      DME agency  [facility]        Status of service:  Completed, signed off Medicare Important Message given?  YES (If response is "NO", the following Medicare IM given date fields will be blank) Date Medicare IM given:  07/28/2012 Date Additional Medicare IM given:    Discharge Disposition:  REST HOME  Per UR Regulation:    If discussed at Long Length of Stay Meetings, dates discussed:    Comments:  07/28/12 1115 Arelie Kuzel RN BSN CM Suction ordered due to dementia and unable to handle secretions. High risk for aspiration of secretions. [facility] notified

## 2012-07-28 NOTE — Procedures (Signed)
[facility] Oaklyn Mans A. Gerilyn Pilgrim, MD     [email or web address]        NAME:  [name] [name], [name] [name]            ACCOUNT NO.:  [id number]  MEDICAL RECORD NO.:  [id number]  LOCATION:                                 FACILITY:  PHYSICIAN:  Bera Pinela A. Gerilyn Pilgrim, M.D. DATE OF BIRTH:  1931-08-03  DATE OF PROCEDURE:  07/26/2012 DATE OF DISCHARGE:                             EEG INTERPRETATION   INDICATION:  This is a 78 year old man who presents with spells of altered mental status and confusion.  The study is being done to evaluate for seizures medication, Timolol, heparin, hydroxyzine, risperidone, senna, rivastatin.  ANALYSIS:  A 16-channel recording using standard 10/20 measurements is conducted for 21 minute.  There is a posterior dominant rhythm which is high as 7-1/2 Hz which attenuates with eye opening.  There is beta activity observed in the frontal areas.  Awake and drowsy activities are recorded.  Photic stimulation is carried out without abnormal changes in the background activity.  There is no focal or lateralized slowing. There is no epileptiform activity observed.  IMPRESSION:  Mild generalized slowing, otherwise no epileptiform activity observed.     Pauline Pegues A. Gerilyn Pilgrim, M.D.     KAD/MEDQ  D:  07/26/2012  T:  07/26/2012  Job:  [id number]

## 2012-07-28 NOTE — Discharge Summary (Signed)
Physician Discharge Summary  [name] [name] ZOX:[id number] DOB: 1931-03-06 DOA: 07/24/2012  PCP: Syliva Overman, MD  Admit date: 07/24/2012 Discharge date: 07/28/2012  Time spent: Greater than 30 minutes  Recommendations for Outpatient Follow-up:  1. Followup with primary care physician.  2. Followup with neurology, Dr Gerilyn Pilgrim.  Discharge Diagnoses:  1. Altered mental status/unresponsiveness, unclear etiology, resolved. 2. Dementia with delusional features. 3. Hypertension. 4. GERD. 5. Hyperlipidemia.   Discharge Condition: Stable and improved.  Diet recommendation: Low glycemic index nutrition.  Filed Weights   07/25/12 0412 07/26/12 0500 07/27/12 0518  Weight: 76.6 kg (168 lb 14 oz) 79.3 kg (174 lb 13.2 oz) 80.2 kg (176 lb 12.9 oz)    History of present illness:  This 78 year old man presents to the hospital with symptoms of altered mental status. Please see initial history as outlined below: HPI: [name] [name] is a 78 y.o. male presents with the above symptoms since this morning. He had a similar episode approximately a month ago when he was admitted. No obvious cause was found, possibly thought to be due secondary to a seizure. He recovered spontaneously and was able to be discharged back to the assisted-living facility. He now once again presents with sudden unresponsiveness. There was no evidence of seizure-like activity witnessed at the assisted-living facility. There is no history of head injury. CT scan of the brain in the emergency room is negative for any acute pathology. The patient himself is unable to give me any clear history secondary to his altered mental status.  Hospital Course:  Patient was admitted and monitored closely. The etiology of his unresponsiveness was not entirely clear. Previously, when he was admitted, for the same symptoms, it was felt that maybe he had had a seizure. On this occasion, the symptoms were similar but it is not clear  whether he had a seizure. He was seen by neurology, Dr Gerilyn Pilgrim and he modified his medications, reducing sedating medications. He also underwent an EEG which showed mild generalized slowing, no epileptiform activity observed. Imaging studies of his brain included a CT brain scan, this was unremarkable. There was stable cerebral atrophy but no acute pathology. Over the last couple of days he is medically and steady improvement, now back to his baseline. He is ready for discharge.  Procedures:  None.  Consultations:  Neurology, Dr Gerilyn Pilgrim.  Discharge Exam: Filed Vitals:   07/27/12 1926 07/27/12 2100 07/28/12 0500 07/28/12 0740  BP:  111/72 114/62   Pulse:  98 82   Temp:  98.7 F (37.1 C) 98.8 F (37.1 C)   TempSrc:  Oral Oral   Resp:  18 16   Height:      Weight:      SpO2: 95% 93% 95% 92%    General: He looks systemically well. He is alert. Cardiovascular: Heart sounds are present and normal without gallop rhythm. Respiratory: Lung fields are clear. There are no focal neurological signs. He is hard of hearing but does follow commands and is appropriate.  Discharge Instructions  Discharge Orders    Future Appointments: Provider: Department: Dept Phone: Center:   11/21/2012 10:30 AM Kerri Perches, MD [facility] [phone] [facility]     Future Orders Please Complete By Expires   Diet - low sodium heart healthy      Increase activity slowly          Medication List     As of 07/28/2012 10:32 AM    STOP taking these medications  acetaZOLAMIDE 250 MG tablet   Commonly known as: DIAMOX      risperiDONE 0.25 MG tablet   Commonly known as: RISPERDAL      Rivastigmine 13.3 MG/24HR Pt24      TAKE these medications         albuterol (2.5 MG/3ML) 0.083% nebulizer solution   Commonly known as: PROVENTIL   Take 2.5 mg by nebulization 4 (four) times daily.      ALPHAGAN P 0.1 % Soln   Generic drug: brimonidine   Place 1 drop into both eyes at  bedtime. One drop in both eyes at bedtime      amLODipine 2.5 MG tablet   Commonly known as: NORVASC   Take 1 tablet (2.5 mg total) by mouth daily.      clonazePAM 0.5 MG tablet   Commonly known as: KLONOPIN   Take 1 tablet (0.5 mg total) by mouth at bedtime.      Dorzolamide HCl-Timolol Mal PF 22.3-6.8 MG/ML Soln   Apply 1 drop to eye 2 (two) times daily.      hydrOXYzine 25 MG tablet   Commonly known as: ATARAX/VISTARIL   Take 25 mg by mouth at bedtime.      latanoprost 0.005 % ophthalmic solution   Commonly known as: XALATAN   Place 1 drop into the left eye at bedtime.      lovastatin 40 MG tablet   Commonly known as: MEVACOR   Take 40 mg by mouth at bedtime.      metFORMIN 500 MG tablet   Commonly known as: GLUCOPHAGE   Take 1 tablet (500 mg total) by mouth 2 (two) times daily with a meal.      multivitamin with minerals tablet   Take 1 tablet by mouth daily.      omeprazole 20 MG capsule   Commonly known as: PRILOSEC   Take 20 mg by mouth daily.      oxybutynin 5 MG 24 hr tablet   Commonly known as: DITROPAN-XL   Take 5 mg by mouth daily.      pilocarpine 1 % ophthalmic solution   Commonly known as: PILOCAR   Place 1 % into both eyes Daily.      rivastigmine 4.6 mg/24hr   Commonly known as: EXELON   Place 1 patch (4.6 mg total) onto the skin daily.      senna-docusate 8.6-50 MG per tablet   Commonly known as: Senokot-S   Take 1 tablet by mouth 2 (two) times daily.      SINGULAIR 10 MG tablet   Generic drug: montelukast   Take 10 mg by mouth at bedtime.      Vitamin D (Ergocalciferol) 50000 UNITS Caps   Commonly known as: DRISDOL   Take 50,000 Units by mouth every 7 (seven) days.          The results of significant diagnostics from this hospitalization (including imaging, microbiology, ancillary and laboratory) are listed below for reference.    Significant Diagnostic Studies: Ct Head Wo Contrast  07/24/2012  *RADIOLOGY REPORT*  Clinical Data:  Altered mental status  CT HEAD WITHOUT CONTRAST  Technique:  Contiguous axial images were obtained from the base of the skull through the vertex without contrast.  Comparison: Brain MRI 06/13/2012  Findings: No skull fracture is noted.  Paranasal sinuses and mastoid air cells are unremarkable.  No intracranial hemorrhage, mass effect or midline shift.  Mild cerebral atrophy.  Stable periventricular and patchy subcortical chronic white matter  disease.  No definite acute cortical infarction.  No mass lesion is noted on this unenhanced scan.  IMPRESSION: No acute intracranial abnormality.  No definite acute cortical infarction.  Stable cerebral atrophy.  Stable chronic white matter disease.   Original Report Authenticated By: Natasha Mead, M.D.    Dg Chest Portable 1 View  07/24/2012  *RADIOLOGY REPORT*  Clinical Data: Altered mental status  PORTABLE CHEST - 1 VIEW  Comparison: 06/12/2012, 04/16/2012  Findings: Background hyperinflation compatible with emphysema. Mild cardiac enlarged without CHF.  Nodular densities bilaterally, more pronounced on the left side.  These correlate with numerous bilateral pleural nodules by chest CT comparison.  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  Negative for focal pneumonia, collapse, consolidation, edema, effusion or pneumothorax.  Trachea midline.  IMPRESSION: Cardiomegaly without CHF or pneumonia  Bilateral nodular densities demonstrated to be pleural nodules by prior chest CT.  See above comment.   Original Report Authenticated By: Judie Petit. Miles Costain, M.D.     Microbiology: Recent Results (from the past 240 hour(s))  CULTURE, BLOOD (ROUTINE X 2)     Status: Normal (Preliminary result)   Collection Time   07/24/12  3:15 PM      Component Value Range Status Comment   Specimen Description BLOOD RIGHT ANTECUBITAL   Final    Special Requests     Final    Value: BOTTLES DRAWN AEROBIC AND ANAEROBIC AEB=8CC ANA=6CC   Culture NO GROWTH 4 DAYS   Final    Report Status PENDING    Incomplete   CULTURE, BLOOD (ROUTINE X 2)     Status: Normal (Preliminary result)   Collection Time   07/24/12  3:19 PM      Component Value Range Status Comment   Specimen Description BLOOD LEFT HAND DRAWN BY RN   Final    Special Requests BOTTLES DRAWN AEROBIC ONLY 5CC   Final    Culture NO GROWTH 4 DAYS   Final    Report Status PENDING   Incomplete   MRSA PCR SCREENING     Status: Normal   Collection Time   07/24/12  6:42 PM      Component Value Range Status Comment   MRSA by PCR NEGATIVE  NEGATIVE Final      Labs: Basic Metabolic Panel:  Lab 07/25/12 1610 07/24/12 1514  NA 140 138  K 4.0 4.4  CL 108 105  CO2 25 25  GLUCOSE 109* 114*  BUN 14 15  CREATININE 1.09 1.09  CALCIUM 10.4 10.9*  MG -- --  PHOS -- --   Liver Function Tests:  Lab 07/25/12 0510 07/24/12 1514  AST 16 15  ALT 8 9  ALKPHOS 70 81  BILITOT 0.2* 0.2*  PROT 7.3 8.4*  ALBUMIN 3.3* 3.9    Lab 07/24/12 1514  LIPASE 38  AMYLASE --    CBC:  Lab 07/25/12 0510 07/24/12 1514  WBC 7.9 8.0  NEUTROABS -- 5.2  HGB 10.5* 10.8*  HCT 36.1* 37.2*  MCV 90.0 91.0  PLT 171 173   Cardiac Enzymes:  Lab 07/24/12 1514  CKTOTAL --  CKMB --  CKMBINDEX --  TROPONINI <0.30     CBG:  Lab 07/24/12 1706 07/24/12 1503  GLUCAP 137* 107*       Signed:  GOSRANI,NIMISH C  [facility] 07/28/2012, 10:32 AM

## 2012-07-28 NOTE — Progress Notes (Signed)
Pt discharged to [facility] today per Dr. Karilyn Cota. Pt's IV site D/c'd and WNL. Pt's VS stable at this time. Pt's daughter provided with home medication list, discharge paperwork, and prescriptions. Verbalized understanding. Made aware of F/U appointment with Dr. Gerilyn Pilgrim. Verbalized understanding. Pt left floor via WC in stable condition accompanied by NT.

## 2012-07-28 NOTE — Clinical Social Work Note (Signed)
Patient ready for discharge.  FL2 reviewed w RN and updated.  Discharge summary faxed to facility.  Discharge packet prepared and placed in shadow chart for discharge.  Family agreeable to patient placement at [facility] and  transporting patient to [facility] and also bringing discharge packet with them.  Colbert Coyer from facility aware that patient is coming today and that suction machine has been requested by RN CM.  PASARR received for ALF, additional documents faxed to [facility] as requested.  Santa Genera, LCSW Clinical Social Worker [phone])

## 2012-07-29 LAB — CULTURE, BLOOD (ROUTINE X 2): Culture: NO GROWTH

## 2012-08-13 ENCOUNTER — Emergency Department (HOSPITAL_COMMUNITY)
Admission: EM | Admit: 2012-08-13 | Discharge: 2012-08-13 | Disposition: A | Discharge status: Home / Self Care | Payer: PRIVATE HEALTH INSURANCE | Source: Home / Self Care | Attending: Emergency Medicine | Admitting: Emergency Medicine

## 2012-08-13 ENCOUNTER — Encounter (HOSPITAL_COMMUNITY): Payer: Self-pay

## 2012-08-13 ENCOUNTER — Emergency Department (HOSPITAL_COMMUNITY): Payer: PRIVATE HEALTH INSURANCE

## 2012-08-13 DIAGNOSIS — R0609 Other forms of dyspnea: Secondary | ICD-10-CM | POA: Insufficient documentation

## 2012-08-13 DIAGNOSIS — E119 Type 2 diabetes mellitus without complications: Secondary | ICD-10-CM | POA: Insufficient documentation

## 2012-08-13 DIAGNOSIS — E785 Hyperlipidemia, unspecified: Secondary | ICD-10-CM | POA: Insufficient documentation

## 2012-08-13 DIAGNOSIS — R0989 Other specified symptoms and signs involving the circulatory and respiratory systems: Secondary | ICD-10-CM | POA: Insufficient documentation

## 2012-08-13 DIAGNOSIS — G8929 Other chronic pain: Secondary | ICD-10-CM | POA: Insufficient documentation

## 2012-08-13 DIAGNOSIS — F0390 Unspecified dementia without behavioral disturbance: Secondary | ICD-10-CM | POA: Insufficient documentation

## 2012-08-13 DIAGNOSIS — H40009 Preglaucoma, unspecified, unspecified eye: Secondary | ICD-10-CM | POA: Insufficient documentation

## 2012-08-13 DIAGNOSIS — R109 Unspecified abdominal pain: Secondary | ICD-10-CM | POA: Insufficient documentation

## 2012-08-13 DIAGNOSIS — R0600 Dyspnea, unspecified: Secondary | ICD-10-CM

## 2012-08-13 DIAGNOSIS — Z859 Personal history of malignant neoplasm, unspecified: Secondary | ICD-10-CM | POA: Insufficient documentation

## 2012-08-13 DIAGNOSIS — F411 Generalized anxiety disorder: Secondary | ICD-10-CM | POA: Insufficient documentation

## 2012-08-13 DIAGNOSIS — Z79899 Other long term (current) drug therapy: Secondary | ICD-10-CM | POA: Insufficient documentation

## 2012-08-13 DIAGNOSIS — Z87891 Personal history of nicotine dependence: Secondary | ICD-10-CM | POA: Insufficient documentation

## 2012-08-13 DIAGNOSIS — I10 Essential (primary) hypertension: Secondary | ICD-10-CM | POA: Insufficient documentation

## 2012-08-13 DIAGNOSIS — J9610 Chronic respiratory failure, unspecified whether with hypoxia or hypercapnia: Secondary | ICD-10-CM | POA: Insufficient documentation

## 2012-08-13 DIAGNOSIS — K219 Gastro-esophageal reflux disease without esophagitis: Secondary | ICD-10-CM | POA: Insufficient documentation

## 2012-08-13 DIAGNOSIS — Z8679 Personal history of other diseases of the circulatory system: Secondary | ICD-10-CM | POA: Insufficient documentation

## 2012-08-13 HISTORY — DX: Bradycardia, unspecified: R00.1

## 2012-08-13 LAB — URINALYSIS, ROUTINE W REFLEX MICROSCOPIC
Bilirubin Urine: NEGATIVE
Ketones, ur: NEGATIVE mg/dL
Nitrite: NEGATIVE
Protein, ur: NEGATIVE mg/dL
Urobilinogen, UA: 0.2 mg/dL (ref 0.0–1.0)
pH: 5.5 (ref 5.0–8.0)

## 2012-08-13 LAB — BASIC METABOLIC PANEL
BUN: 12 mg/dL (ref 6–23)
CO2: 31 mEq/L (ref 19–32)
Calcium: 10.6 mg/dL — ABNORMAL HIGH (ref 8.4–10.5)
Creatinine, Ser: 1.07 mg/dL (ref 0.50–1.35)
GFR calc Af Amer: 74 mL/min — ABNORMAL LOW (ref >90)

## 2012-08-13 LAB — CBC WITH DIFFERENTIAL/PLATELET
Basophils Absolute: 0 10*3/uL (ref 0.0–0.1)
Basophils Relative: 0 % (ref 0–1)
Eosinophils Relative: 2 % (ref 0–5)
Lymphocytes Relative: 27 % (ref 12–46)
MCHC: 30 g/dL (ref 30.0–36.0)
MCV: 89.7 fL (ref 78.0–100.0)
Platelets: 288 10*3/uL (ref 150–400)
RDW: 14.8 % (ref 11.5–15.5)
WBC: 8.5 10*3/uL (ref 4.0–10.5)

## 2012-08-13 LAB — URINE MICROSCOPIC-ADD ON

## 2012-08-13 MED ORDER — ALBUTEROL SULFATE (5 MG/ML) 0.5% IN NEBU
5.0000 mg | INHALATION_SOLUTION | Freq: Once | RESPIRATORY_TRACT | Status: AC
  Administered 2012-08-13: 5 mg via RESPIRATORY_TRACT
  Filled 2012-08-13: qty 1

## 2012-08-13 MED ORDER — IPRATROPIUM BROMIDE 0.02 % IN SOLN
0.5000 mg | Freq: Once | RESPIRATORY_TRACT | Status: AC
  Administered 2012-08-13: 0.5 mg via RESPIRATORY_TRACT
  Filled 2012-08-13: qty 2.5

## 2012-08-13 MED ORDER — QUETIAPINE FUMARATE 50 MG PO TABS: 50.0000 mg | ORAL_TABLET | Freq: Every day | ORAL | Status: DC

## 2012-08-13 MED ORDER — RIVASTIGMINE 9.5 MG/24HR TD PT24: 1.0000 | MEDICATED_PATCH | Freq: Every day | TRANSDERMAL | Status: DC

## 2012-08-13 NOTE — ED Notes (Signed)
Family at bedside stating pt was sent here secondary to altered mental status x 2 days worsening throughout the night and today. Pt's appetite has also decreased per family. EDP aware of families concerns.

## 2012-08-13 NOTE — ED Notes (Signed)
Pt attempting to crawl over side rail to get out of bed. Pt reoriented to place and observed to prevent fall.

## 2012-08-13 NOTE — ED Notes (Signed)
MD at bedside. Dr Berton Mount at bedside discussing plan of care with family. Family verbalized understanding.

## 2012-08-13 NOTE — ED Notes (Signed)
-  Pt presents via EMS secondary to call to nursing facility for respiratory distress, and dehydration. Decreased I/O and urine is dark in color per EMS. Pt was received on 2 LPM [place], per maintained at nursing facility.  Pt has history of dementia. Pt is Hard of hearing.

## 2012-08-13 NOTE — ED Provider Notes (Signed)
History    This chart was scribed for [name] Gaskins, MD, MD by Smitty Pluck, ED Scribe. The patient was seen in room APA05 and the patient's care was started at 6:04PM.   CSN: [id number]  Arrival date & time 08/13/12  1756       Chief Complaint  Patient presents with  . Shortness of Breath  . Altered Mental Status    Patient is a 78 y.o. male presenting with shortness of breath and altered mental status. The history is provided by the patient and the EMS personnel. The history is limited by the absence of a caregiver. No language interpreter was used.  Shortness of Breath  The current episode started today. The problem occurs continuously. The problem has been unchanged. The problem is moderate. Associated symptoms include shortness of breath.  Altered Mental Status Associated symptoms include shortness of breath.  Pt is Level 5 caveat  [name] [name] is a 78 y.o. male with hx of dementia, HTN who presents to the Emergency Department BIB EMS from rest home due for moderate SOB. Per EMS upon arrival pt was not in any respiratory distress. Pt normally uses 2L of O2 at home. Per EMS pt has been awake since 1AM today and his urine appears dark in color. Pt is DNR.   No other details are known at arrival as patient can not answer questions fully  Past Medical History  Diagnosis Date  . Glaucoma(365)   . Anxiety   . GERD (gastroesophageal reflux disease)   . Hyperlipidemia   . Hypertension   . Chronic abdominal pain   . Mesothelioma   . 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 Chronic respiratory failure   . DM type 2 (diabetes mellitus, type 2) 06/13/2012  . Altered mental status 06/12/2012    Unwitnessed isolated seizure suspected, but because of the EEG being normal, Dr. Gerilyn Pilgrim did not recommend antiseizure therapy.  . Hard of hearing   . Seizure 06/14/2012    Isolated seizure suspected, but not confirmed.  . Bradycardia   . Mesothelioma     Past  Surgical History  Procedure Date  . Hernia repair   . Prostate surgery   . Right eye laser surgery 2005  . Left eye surgery for glaucoma at [facility] 10/2009    Family History  Problem Relation Age of Onset  . Lung cancer Sister     History  Substance Use Topics  . Smoking status: Former Games developer  . Smokeless tobacco: Not on file  . Alcohol Use: No      Review of Systems  Unable to perform ROS: Dementia  Respiratory: Positive for shortness of breath.   Psychiatric/Behavioral: Positive for altered mental status.    Allergies  Review of patient's allergies indicates no known allergies.  Home Medications   Current Outpatient Rx  Name  Route  Sig  Dispense  Refill  . ALBUTEROL SULFATE (2.5 MG/3ML) 0.083% IN NEBU   Nebulization   Take 2.5 mg by nebulization 4 (four) times daily.         Marland Kitchen AMLODIPINE BESYLATE 2.5 MG PO TABS   Oral   Take 1 tablet (2.5 mg total) by mouth daily.   30 tablet   11     Dose reduction effective 07/18/2012. Discontinue  ...   . BRIMONIDINE TARTRATE 0.1 % OP SOLN   Both Eyes   Place 1 drop into both eyes at bedtime. One drop in both eyes at bedtime         .  CLONAZEPAM 0.5 MG PO TABS   Oral   Take 1 tablet (0.5 mg total) by mouth at bedtime.   30 tablet   0   . DORZOLAMIDE HCL-TIMOLOL MAL PF 22.3-6.8 MG/ML OP SOLN   Ophthalmic   Apply 1 drop to eye 2 (two) times daily.          Marland Kitchen HYDROXYZINE HCL 25 MG PO TABS   Oral   Take 25 mg by mouth at bedtime.           Marland Kitchen LATANOPROST 0.005 % OP SOLN   Left Eye   Place 1 drop into the left eye at bedtime.          Marland Kitchen LOVASTATIN 40 MG PO TABS   Oral   Take 40 mg by mouth at bedtime.           Marland Kitchen METFORMIN HCL 500 MG PO TABS   Oral   Take 1 tablet (500 mg total) by mouth 2 (two) times daily with a meal.   60 tablet   11   . MONTELUKAST SODIUM 10 MG PO TABS   Oral   Take 10 mg by mouth at bedtime.           . MULTI-VITAMIN/MINERALS PO TABS   Oral   Take 1 tablet by  mouth daily.   30 tablet   6   . OMEPRAZOLE 20 MG PO CPDR   Oral   Take 20 mg by mouth daily.         . OXYBUTYNIN CHLORIDE ER 5 MG PO TB24   Oral   Take 5 mg by mouth daily.           Marland Kitchen PILOCARPINE HCL 1 % OP SOLN   Both Eyes   Place 1 % into both eyes Daily.         Marland Kitchen RIVASTIGMINE 4.6 MG/24HR TD PT24   Transdermal   Place 1 patch (4.6 mg total) onto the skin daily.   30 patch   0   . SENNOSIDES-DOCUSATE SODIUM 8.6-50 MG PO TABS   Oral   Take 1 tablet by mouth 2 (two) times daily.   60 tablet   3   . VITAMIN D (ERGOCALCIFEROL) 50000 UNITS PO CAPS   Oral   Take 50,000 Units by mouth every 7 (seven) days.           BP 125/70  Pulse 76  Temp 98.7 F (37.1 C) (Oral)  Resp 18  SpO2 94% BP 146/70  Pulse 89  Temp 98.7 F (37.1 C) (Oral)  Resp 18  SpO2 98%   Physical Exam  Nursing note and vitals reviewed. CONSTITUTIONAL: Well developed/well nourished HEAD AND FACE: Normocephalic/atraumatic EYES: EOMI/PERRL ENMT: Mucous membranes moist NECK: supple no meningeal signs SPINE:entire spine nontender CV: S1/S2 noted, no murmurs/rubs/gallops noted LUNGS: coarse breath sounds bilaterally, no apparent distress ABDOMEN: soft, nontender, no rebound or guarding GU:no cva tenderness NEURO: Pt is awake/alert, moves all extremitiesx4, pt appears confused. He will answer some questions appropriately and then stop talking.  He does follow commands.  At times he will try to crawl out of bed EXTREMITIES: pulses normal, full ROM SKIN: warm, color normal PSYCH: no abnormalities of mood noted   ED Course  Procedures  DIAGNOSTIC STUDIES: Oxygen Saturation is 94% on [place], adequate by my interpretation.    COORDINATION OF CARE: 6:09 PM Discussed ED treatment with pt  6:21 PM Ordered:    . albuterol  5 mg Nebulization  Once  . ipratropium  0.5 mg Nebulization Once       Labs Reviewed  BASIC METABOLIC PANEL - Abnormal; Notable for the following:    Glucose, Bld  106 (*)     Calcium 10.6 (*)     GFR calc non Af Amer 64 (*)     GFR calc Af Amer 74 (*)     All other components within normal limits  CBC WITH DIFFERENTIAL - Abnormal; Notable for the following:    RBC 4.16 (*)     Hemoglobin 11.2 (*)     HCT 37.3 (*)     All other components within normal limits  URINALYSIS, ROUTINE W REFLEX MICROSCOPIC   Dg Chest Port 1 View  08/13/2012  *RADIOLOGY REPORT*  Clinical Data: Shortness of breath and altered mental status. History of mesothelioma.  PORTABLE CHEST - 1 VIEW  Comparison: 07/24/2012  Findings: Chronic lung disease and pleural-based nodularity is stable.  The heart size is stable.  There is stable tortuosity of the thoracic aorta.  No infiltrates, edema or pleural fluid identified.  IMPRESSION: No acute findings.  Stable chronic lung disease and pleural nodules.   Original Report Authenticated By: Irish Lack, M.D.       I had a long discussion with daughters.  They feel his dementia/mental status has been declining since he had his exelon patch decreased since last admission. This is not an acute issue today, it was just that rest home felt he needed eval due to decreased sleep and some agitation   They deny recent falls/head injuries.  No fever reported.  He can ambulate, but at times will refuse to walk even though he can on his own.  He is also not sleeping well at rest home.  They feel he needs patch increased.  While he was in the ED I received info from dr Gerilyn Pilgrim who recommend increasing exelon, stopping atarax/klonopin and starting seroquel.  Family is comfortable with this plan and do not want further workup or admission. His labs/imaging were unremarkable He is stable for d/c   MDM  Nursing notes including past medical history and social history reviewed and considered in documentation xrays reviewed and considered Previous records reviewed and considered - recent d/c summary reviewed Labs/vital reviewed and considered       Date: 08/13/2012  Rate: 78  Rhythm: normal sinus rhythm  QRS Axis: normal  Intervals: normal  ST/T Wave abnormalities: nonspecific ST changes  Conduction Disutrbances:none  Narrative Interpretation:   Old EKG Reviewed: unchanged    I personally performed the services described in this documentation, which was scribed in my presence. The recorded information has been reviewed and is accurate.        [name] Gaskins, MD 08/13/12 2259

## 2012-08-14 ENCOUNTER — Emergency Department (HOSPITAL_COMMUNITY): Payer: PRIVATE HEALTH INSURANCE

## 2012-08-14 ENCOUNTER — Encounter (HOSPITAL_COMMUNITY): Payer: Self-pay | Admitting: Emergency Medicine

## 2012-08-14 ENCOUNTER — Inpatient Hospital Stay (HOSPITAL_COMMUNITY)
Admission: EM | Admit: 2012-08-14 | Discharge: 2012-08-26 | DRG: 092 | Disposition: A | Discharge status: Skilled Nursing Facility | Payer: PRIVATE HEALTH INSURANCE | Source: Ambulatory Visit | Attending: Internal Medicine | Admitting: Internal Medicine

## 2012-08-14 DIAGNOSIS — E1169 Type 2 diabetes mellitus with other specified complication: Secondary | ICD-10-CM | POA: Diagnosis present

## 2012-08-14 DIAGNOSIS — K219 Gastro-esophageal reflux disease without esophagitis: Secondary | ICD-10-CM

## 2012-08-14 DIAGNOSIS — R7303 Prediabetes: Secondary | ICD-10-CM

## 2012-08-14 DIAGNOSIS — E785 Hyperlipidemia, unspecified: Secondary | ICD-10-CM

## 2012-08-14 DIAGNOSIS — R410 Disorientation, unspecified: Secondary | ICD-10-CM

## 2012-08-14 DIAGNOSIS — R059 Cough, unspecified: Secondary | ICD-10-CM

## 2012-08-14 DIAGNOSIS — F0390 Unspecified dementia without behavioral disturbance: Secondary | ICD-10-CM | POA: Diagnosis present

## 2012-08-14 DIAGNOSIS — R0602 Shortness of breath: Secondary | ICD-10-CM

## 2012-08-14 DIAGNOSIS — R569 Unspecified convulsions: Secondary | ICD-10-CM

## 2012-08-14 DIAGNOSIS — J309 Allergic rhinitis, unspecified: Secondary | ICD-10-CM

## 2012-08-14 DIAGNOSIS — R0902 Hypoxemia: Secondary | ICD-10-CM

## 2012-08-14 DIAGNOSIS — R918 Other nonspecific abnormal finding of lung field: Secondary | ICD-10-CM

## 2012-08-14 DIAGNOSIS — G4700 Insomnia, unspecified: Secondary | ICD-10-CM

## 2012-08-14 DIAGNOSIS — E876 Hypokalemia: Secondary | ICD-10-CM | POA: Diagnosis present

## 2012-08-14 DIAGNOSIS — E119 Type 2 diabetes mellitus without complications: Secondary | ICD-10-CM

## 2012-08-14 DIAGNOSIS — J9610 Chronic respiratory failure, unspecified whether with hypoxia or hypercapnia: Secondary | ICD-10-CM

## 2012-08-14 DIAGNOSIS — G92 Toxic encephalopathy: Principal | ICD-10-CM | POA: Diagnosis present

## 2012-08-14 DIAGNOSIS — R4182 Altered mental status, unspecified: Secondary | ICD-10-CM | POA: Diagnosis present

## 2012-08-14 DIAGNOSIS — K5900 Constipation, unspecified: Secondary | ICD-10-CM

## 2012-08-14 DIAGNOSIS — I10 Essential (primary) hypertension: Secondary | ICD-10-CM

## 2012-08-14 DIAGNOSIS — M549 Dorsalgia, unspecified: Secondary | ICD-10-CM

## 2012-08-14 DIAGNOSIS — F411 Generalized anxiety disorder: Secondary | ICD-10-CM

## 2012-08-14 DIAGNOSIS — G929 Unspecified toxic encephalopathy: Principal | ICD-10-CM | POA: Diagnosis present

## 2012-08-14 DIAGNOSIS — F0392 Unspecified dementia, unspecified severity, with psychotic disturbance: Secondary | ICD-10-CM

## 2012-08-14 DIAGNOSIS — Z66 Do not resuscitate: Secondary | ICD-10-CM | POA: Diagnosis present

## 2012-08-14 DIAGNOSIS — D649 Anemia, unspecified: Secondary | ICD-10-CM

## 2012-08-14 DIAGNOSIS — R05 Cough: Secondary | ICD-10-CM

## 2012-08-14 DIAGNOSIS — R5383 Other fatigue: Secondary | ICD-10-CM

## 2012-08-14 DIAGNOSIS — R109 Unspecified abdominal pain: Secondary | ICD-10-CM

## 2012-08-14 DIAGNOSIS — F22 Delusional disorders: Secondary | ICD-10-CM

## 2012-08-14 DIAGNOSIS — R001 Bradycardia, unspecified: Secondary | ICD-10-CM

## 2012-08-14 DIAGNOSIS — R5381 Other malaise: Secondary | ICD-10-CM

## 2012-08-14 DIAGNOSIS — H409 Unspecified glaucoma: Secondary | ICD-10-CM

## 2012-08-14 DIAGNOSIS — N390 Urinary tract infection, site not specified: Secondary | ICD-10-CM

## 2012-08-14 DIAGNOSIS — Z87891 Personal history of nicotine dependence: Secondary | ICD-10-CM

## 2012-08-14 DIAGNOSIS — IMO0002 Reserved for concepts with insufficient information to code with codable children: Secondary | ICD-10-CM

## 2012-08-14 HISTORY — DX: Other forms of dyspnea: R06.09

## 2012-08-14 HISTORY — DX: Dyspnea, unspecified: R06.00

## 2012-08-14 HISTORY — DX: Adverse effect of unspecified anesthetic, initial encounter: T41.45XA

## 2012-08-14 HISTORY — DX: Pleural plaque without asbestos: J92.9

## 2012-08-14 LAB — URINE MICROSCOPIC-ADD ON

## 2012-08-14 LAB — HEPATIC FUNCTION PANEL
Bilirubin, Direct: <0.1 mg/dL (ref 0.0–0.3)
Total Protein: 7.4 g/dL (ref 6.0–8.3)

## 2012-08-14 LAB — CREATININE, SERUM
GFR calc Af Amer: 70 mL/min — ABNORMAL LOW (ref >90)
GFR calc non Af Amer: 61 mL/min — ABNORMAL LOW (ref >90)

## 2012-08-14 LAB — CBC
Platelets: 286 10*3/uL (ref 150–400)
RBC: 4.08 MIL/uL — ABNORMAL LOW (ref 4.22–5.81)
RDW: 14.9 % (ref 11.5–15.5)
WBC: 9.5 10*3/uL (ref 4.0–10.5)

## 2012-08-14 LAB — BASIC METABOLIC PANEL
Calcium: 10.6 mg/dL — ABNORMAL HIGH (ref 8.4–10.5)
GFR calc Af Amer: 63 mL/min — ABNORMAL LOW (ref >90)
GFR calc non Af Amer: 55 mL/min — ABNORMAL LOW (ref >90)
Potassium: 4.7 mEq/L (ref 3.5–5.1)
Sodium: 138 mEq/L (ref 135–145)

## 2012-08-14 LAB — ALBUMIN: Albumin: 3.7 g/dL (ref 3.5–5.2)

## 2012-08-14 LAB — GLUCOSE, CAPILLARY: Glucose-Capillary: 86 mg/dL (ref 70–99)

## 2012-08-14 LAB — URINALYSIS, ROUTINE W REFLEX MICROSCOPIC
Nitrite: NEGATIVE
Protein, ur: 30 mg/dL — AB
Specific Gravity, Urine: 1.018 (ref 1.005–1.030)
Urobilinogen, UA: 1 mg/dL (ref 0.0–1.0)

## 2012-08-14 LAB — TROPONIN I: Troponin I: <0.3 ng/mL (ref <0.30)

## 2012-08-14 LAB — CBC WITH DIFFERENTIAL/PLATELET
Basophils Absolute: 0 10*3/uL (ref 0.0–0.1)
Basophils Relative: 0 % (ref 0–1)
Eosinophils Absolute: 0.1 10*3/uL (ref 0.0–0.7)
Hemoglobin: 11.4 g/dL — ABNORMAL LOW (ref 13.0–17.0)
MCHC: 29.6 g/dL — ABNORMAL LOW (ref 30.0–36.0)
Monocytes Relative: 8 % (ref 3–12)
Neutro Abs: 6.1 10*3/uL (ref 1.7–7.7)
Neutrophils Relative %: 71 % (ref 43–77)
Platelets: 296 10*3/uL (ref 150–400)
RDW: 14.9 % (ref 11.5–15.5)

## 2012-08-14 MED ORDER — ACETAMINOPHEN 650 MG RE SUPP: 650.0000 mg | Freq: Four times a day (QID) | RECTAL | Status: DC | PRN

## 2012-08-14 MED ORDER — DORZOLAMIDE HCL-TIMOLOL MAL PF 22.3-6.8 MG/ML OP SOLN: 1.0000 [drp] | Freq: Two times a day (BID) | OPHTHALMIC | Status: DC

## 2012-08-14 MED ORDER — ONDANSETRON HCL 4 MG PO TABS: 4.0000 mg | ORAL_TABLET | Freq: Four times a day (QID) | ORAL | Status: DC | PRN

## 2012-08-14 MED ORDER — SENNOSIDES-DOCUSATE SODIUM 8.6-50 MG PO TABS
1.0000 | ORAL_TABLET | Freq: Two times a day (BID) | ORAL | Status: DC
  Administered 2012-08-15 – 2012-08-23 (×11): 1 via ORAL
  Filled 2012-08-14 (×21): qty 1

## 2012-08-14 MED ORDER — LATANOPROST 0.005 % OP SOLN
1.0000 [drp] | Freq: Every day | OPHTHALMIC | Status: DC
  Administered 2012-08-15 – 2012-08-25 (×9): 1 [drp] via OPHTHALMIC
  Filled 2012-08-14: qty 2.5

## 2012-08-14 MED ORDER — SODIUM CHLORIDE 0.9 % IV BOLUS (SEPSIS): 500.0000 mL | Freq: Once | INTRAVENOUS | Status: DC

## 2012-08-14 MED ORDER — MONTELUKAST SODIUM 10 MG PO TABS
10.0000 mg | ORAL_TABLET | Freq: Every day | ORAL | Status: DC
  Administered 2012-08-15 – 2012-08-25 (×10): 10 mg via ORAL
  Filled 2012-08-14 (×13): qty 1

## 2012-08-14 MED ORDER — OXYBUTYNIN CHLORIDE ER 5 MG PO TB24
5.0000 mg | ORAL_TABLET | Freq: Every day | ORAL | Status: DC
  Administered 2012-08-15 – 2012-08-26 (×10): 5 mg via ORAL
  Filled 2012-08-14 (×12): qty 1

## 2012-08-14 MED ORDER — SIMVASTATIN 5 MG PO TABS
5.0000 mg | ORAL_TABLET | Freq: Every day | ORAL | Status: DC
  Administered 2012-08-15 – 2012-08-25 (×9): 5 mg via ORAL
  Filled 2012-08-14 (×13): qty 1

## 2012-08-14 MED ORDER — LEVALBUTEROL HCL 0.63 MG/3ML IN NEBU
0.6300 mg | INHALATION_SOLUTION | Freq: Four times a day (QID) | RESPIRATORY_TRACT | Status: DC | PRN
  Administered 2012-08-19: 0.63 mg via RESPIRATORY_TRACT
  Filled 2012-08-14: qty 3

## 2012-08-14 MED ORDER — DORZOLAMIDE HCL-TIMOLOL MAL 2-0.5 % OP SOLN
1.0000 [drp] | Freq: Two times a day (BID) | OPHTHALMIC | Status: DC
  Administered 2012-08-15 – 2012-08-26 (×17): 1 [drp] via OPHTHALMIC
  Filled 2012-08-14: qty 10

## 2012-08-14 MED ORDER — ONDANSETRON HCL 4 MG/2ML IJ SOLN: 4.0000 mg | Freq: Four times a day (QID) | INTRAMUSCULAR | Status: DC | PRN

## 2012-08-14 MED ORDER — BRIMONIDINE TARTRATE 0.1 % OP SOLN: 1.0000 [drp] | Freq: Every day | OPHTHALMIC | Status: DC

## 2012-08-14 MED ORDER — INSULIN ASPART 100 UNIT/ML ~~LOC~~ SOLN
0.0000 [IU] | Freq: Three times a day (TID) | SUBCUTANEOUS | Status: DC
  Administered 2012-08-17: 2 [IU] via SUBCUTANEOUS

## 2012-08-14 MED ORDER — AMLODIPINE BESYLATE 2.5 MG PO TABS
2.5000 mg | ORAL_TABLET | Freq: Every day | ORAL | Status: DC
  Administered 2012-08-15 – 2012-08-19 (×5): 2.5 mg via ORAL
  Filled 2012-08-14 (×5): qty 1

## 2012-08-14 MED ORDER — ENOXAPARIN SODIUM 40 MG/0.4ML ~~LOC~~ SOLN
40.0000 mg | SUBCUTANEOUS | Status: DC
  Administered 2012-08-16 – 2012-08-25 (×10): 40 mg via SUBCUTANEOUS
  Filled 2012-08-14 (×13): qty 0.4

## 2012-08-14 MED ORDER — ACETAMINOPHEN 325 MG PO TABS: 650.0000 mg | ORAL_TABLET | Freq: Four times a day (QID) | ORAL | Status: DC | PRN

## 2012-08-14 MED ORDER — SODIUM CHLORIDE 0.9 % IJ SOLN
3.0000 mL | Freq: Two times a day (BID) | INTRAMUSCULAR | Status: DC
  Administered 2012-08-15 – 2012-08-26 (×14): 3 mL via INTRAVENOUS

## 2012-08-14 MED ORDER — ALBUTEROL SULFATE (5 MG/ML) 0.5% IN NEBU
2.5000 mg | INHALATION_SOLUTION | Freq: Four times a day (QID) | RESPIRATORY_TRACT | Status: DC
  Administered 2012-08-14 – 2012-08-16 (×5): 2.5 mg via RESPIRATORY_TRACT
  Filled 2012-08-14 (×6): qty 0.5

## 2012-08-14 MED ORDER — DEXTROSE 5 % IV SOLN
1.0000 g | Freq: Two times a day (BID) | INTRAVENOUS | Status: DC
  Administered 2012-08-14: 15:00:00 via INTRAVENOUS
  Administered 2012-08-14 – 2012-08-17 (×6): 1 g via INTRAVENOUS
  Filled 2012-08-14 (×10): qty 1

## 2012-08-14 MED ORDER — RIVASTIGMINE 4.6 MG/24HR TD PT24
4.6000 mg | MEDICATED_PATCH | Freq: Every day | TRANSDERMAL | Status: DC
  Administered 2012-08-15: 4.6 mg via TRANSDERMAL
  Filled 2012-08-14 (×3): qty 1

## 2012-08-14 MED ORDER — BRIMONIDINE TARTRATE 0.2 % OP SOLN
1.0000 [drp] | Freq: Every day | OPHTHALMIC | Status: DC
  Administered 2012-08-15 – 2012-08-25 (×9): 1 [drp] via OPHTHALMIC
  Filled 2012-08-14: qty 5

## 2012-08-14 NOTE — ED Notes (Signed)
Per EMS pt is from [facility] with a c/o of altered mental status. He was seen at [facility] yesterday for Dementia, they adjusted his medications and today he has become worse. Pt was combative with EMS. Vitals 148/90, 88 HR, 14 Resp, 98% 4L [place], CBG 164. 18g LFA.

## 2012-08-14 NOTE — Progress Notes (Signed)
Utilization Review Completed.Sahith Nurse T1/25/2013   

## 2012-08-14 NOTE — ED Notes (Addendum)
Pt is hard of hearing, pt was calm when brought in but began to get agitated when we tried to undress him. Pt is a little confused. Has a hx of dementia.

## 2012-08-14 NOTE — ED Provider Notes (Signed)
History     CSN: [id number]  Arrival date & time 08/14/12  4098   First MD Initiated Contact with Patient 08/14/12 [phone]      Chief Complaint  Patient presents with  . Altered Mental Status    (Consider location/radiation/quality/duration/timing/severity/associated sxs/prior treatment) Patient is a 78 y.o. male presenting with altered mental status. The history is provided by the patient.  Altered Mental Status This is a new problem. The current episode started yesterday. The problem occurs constantly. The problem has been unchanged. Pertinent negatives include no abdominal pain, change in bowel habit, chest pain, coughing, fatigue, fever, nausea, rash or visual change. Nothing aggravates the symptoms. He has tried nothing for the symptoms.    Past Medical History  Diagnosis Date  . Glaucoma(365)   . Anxiety   . GERD (gastroesophageal reflux disease)   . Hyperlipidemia   . Hypertension   . Chronic abdominal pain   . Mesothelioma   . 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 Chronic respiratory failure   . DM type 2 (diabetes mellitus, type 2) 06/13/2012  . Altered mental status 06/12/2012    Unwitnessed isolated seizure suspected, but because of the EEG being normal, Dr. Gerilyn Pilgrim did not recommend antiseizure therapy.  . Hard of hearing   . Seizure 06/14/2012    Isolated seizure suspected, but not confirmed.  . Bradycardia   . Mesothelioma     Past Surgical History  Procedure Date  . Hernia repair   . Prostate surgery   . Right eye laser surgery 2005  . Left eye surgery for glaucoma at [facility] 10/2009    Family History  Problem Relation Age of Onset  . Lung cancer Sister     History  Substance Use Topics  . Smoking status: Former Games developer  . Smokeless tobacco: Not on file  . Alcohol Use: No      Review of Systems  Constitutional: Negative for fever and fatigue.  Respiratory: Negative for cough and shortness of breath.   Cardiovascular:  Negative for chest pain.  Gastrointestinal: Negative for nausea, abdominal pain and change in bowel habit.  Skin: Negative for rash.  Psychiatric/Behavioral: Positive for altered mental status.  All other systems reviewed and are negative.    Allergies  Review of patient's allergies indicates no known allergies.  Home Medications   Current Outpatient Rx  Name  Route  Sig  Dispense  Refill  . ALBUTEROL SULFATE (2.5 MG/3ML) 0.083% IN NEBU   Nebulization   Take 2.5 mg by nebulization 4 (four) times daily.         Marland Kitchen AMLODIPINE BESYLATE 2.5 MG PO TABS   Oral   Take 1 tablet (2.5 mg total) by mouth daily.   30 tablet   11     Dose reduction effective 07/18/2012. Discontinue  ...   . BRIMONIDINE TARTRATE 0.1 % OP SOLN   Both Eyes   Place 1 drop into both eyes at bedtime. One drop in both eyes at bedtime         . DORZOLAMIDE HCL-TIMOLOL MAL PF 22.3-6.8 MG/ML OP SOLN   Ophthalmic   Apply 1 drop to eye 2 (two) times daily.          Marland Kitchen LATANOPROST 0.005 % OP SOLN   Left Eye   Place 1 drop into the left eye at bedtime.          Marland Kitchen LOVASTATIN 40 MG PO TABS   Oral   Take 40 mg  by mouth at bedtime.           Marland Kitchen METFORMIN HCL 500 MG PO TABS   Oral   Take 1 tablet (500 mg total) by mouth 2 (two) times daily with a meal.   60 tablet   11   . MONTELUKAST SODIUM 10 MG PO TABS   Oral   Take 10 mg by mouth at bedtime.           . MULTI-VITAMIN/MINERALS PO TABS   Oral   Take 1 tablet by mouth daily.   30 tablet   6   . OMEPRAZOLE 20 MG PO CPDR   Oral   Take 20 mg by mouth daily.         . OXYBUTYNIN CHLORIDE ER 5 MG PO TB24   Oral   Take 5 mg by mouth daily.           Marland Kitchen PILOCARPINE HCL 1 % OP SOLN   Both Eyes   Place 1 drop into both eyes Daily.          . QUETIAPINE FUMARATE 50 MG PO TABS   Oral   Take 1 tablet (50 mg total) by mouth at bedtime.   15 tablet   0   . RIVASTIGMINE 9.5 MG/24HR TD PT24   Transdermal   Place 1 patch (9.5 mg total)  onto the skin daily.   1 patch   12   . SENNOSIDES-DOCUSATE SODIUM 8.6-50 MG PO TABS   Oral   Take 1 tablet by mouth 2 (two) times daily.   60 tablet   3   . VITAMIN D (ERGOCALCIFEROL) 50000 UNITS PO CAPS   Oral   Take 50,000 Units by mouth every 7 (seven) days.           BP 134/87  Pulse 90  Temp 98.6 F (37 C) (Oral)  Resp 20  SpO2 99%  Physical Exam  Constitutional: He appears well-developed and well-nourished. No distress.  HENT:  Head: Normocephalic and atraumatic.  Mouth/Throat: No oropharyngeal exudate.  Eyes: EOM are normal. Pupils are equal, round, and reactive to light.  Neck: Normal range of motion. Neck supple.  Cardiovascular: Normal rate and regular rhythm.  Exam reveals no friction rub.   No murmur heard. Pulmonary/Chest: Effort normal and breath sounds normal. No respiratory distress. He has no wheezes. He has no rales.  Abdominal: He exhibits no distension. There is no tenderness. There is no rebound.  Musculoskeletal: Normal range of motion. He exhibits no edema.  Neurological: He is alert. No cranial nerve deficit. He exhibits normal muscle tone. Coordination normal.       Patient answers questions, however is disoriented.  Skin: He is not diaphoretic.    ED Course  Procedures (including critical care time)   Labs Reviewed  CBC WITH DIFFERENTIAL  URINALYSIS, ROUTINE W REFLEX MICROSCOPIC  BASIC METABOLIC PANEL   Dg Chest Port 1 View  08/13/2012  *RADIOLOGY REPORT*  Clinical Data: Shortness of breath and altered mental status. History of mesothelioma.  PORTABLE CHEST - 1 VIEW  Comparison: 07/24/2012  Findings: Chronic lung disease and pleural-based nodularity is stable.  The heart size is stable.  There is stable tortuosity of the thoracic aorta.  No infiltrates, edema or pleural fluid identified.  IMPRESSION: No acute findings.  Stable chronic lung disease and pleural nodules.   Original Report Authenticated By: Irish Lack, M.D.      1.  Unspecified essential hypertension   2. UTI (lower  urinary tract infection)   3. Altered mental status      Date: 08/14/2012  Rate: 91  Rhythm: normal sinus rhythm  QRS Axis: normal  Intervals: normal  ST/T Wave abnormalities: normal  Conduction Disutrbances:none  Narrative Interpretation:   Old EKG Reviewed: unchanged and heavy artifact    MDM   Patient is a 78 year old male with history of dementia. He was evaluated for altered mental status yesterday. He was sent home after Dr. Clide Cliff spoke with his primary care doctor and follow has recommended drug changes which included stopping of Xanax, initiating Seroquel, and increasing his Exelon patch. Daughters do not think is increased Exelon patch was given. Patient apparently did not sleep last night was more combative with his nursing home staff per the daughters. Yesterday a urine was normal. Today, patient has normal vital signs. He is resting in bed and awakening this questions and follows commands properly. His lungs are clear belly is soft. Will be further workup today including head CT, EKG, labs, rectal temp to see if he is possibly infected or find a reason for his altered mental status. I explained to the daughter this could be a progression of his Alzheimer's, which they understand.  Labs showed a urinary tract infection patient given cefepime since he is healthcare acquired urinary tract infection. Patient admitted to medicine.      Elwin Mocha, MD 08/14/12 [phone]

## 2012-08-14 NOTE — ED Notes (Signed)
Family at bedside helping pt use urinal. Pt remains calm with presence of family. Pt alert, calm, comfortable.

## 2012-08-14 NOTE — ED Notes (Signed)
Dr. Walden at bedside 

## 2012-08-14 NOTE — H&P (Signed)
[facility] History and Physical  [name] [name] ZOX:[id number] DOB: 02-15-1931 DOA: 08/14/2012  Referring physician: ED  PCP: Syliva Overman, MD   Chief Complaint: Altered mental status  HPI:  78 year old male presents to the ER from [facility] home with a c/o of altered mental status. He was seen at [facility] yesterday for a similar presentation, they increased his Exelon patch, however the patient has not received the increased dose as of yet. He was on a 16 mg patch that was reduced to 4 mg in the ED recommended him to double up the dose yesterday. Yesterday He was sent home after Dr. Clide Cliff spoke with his primary care doctor who recommended drug changes which included stopping of Xanax, initiating Seroquel, and increasing his Exelon patch. Daughters do not think is increased Exelon patch was given. Patient apparently did not sleep last night was more combative with his nursing home staff per the daughters. Yesterday a urine was normal. Today, patient has normal vital signs. He is resting in bed and awakening this questions and follows commands properly.  The patient was also found to have a mild UTI however he did not receive any treatment for it. Pt was combative with EMS. Vitals 148/90, 88 HR, 14 Resp, 98% 4L [place]. Today the patient was brought back to [facility], and he found to be more combative and confused. He was found to have reviewing the WBCs too numerous to count, with a large amount of leukocyte esterase. No documented fever nausea vomiting abdominal pain at the nursing home. He has received cefepime in the ED       Review of Systems: negative for the following  Constitutional: Denies fever, chills, diaphoresis, appetite change and fatigue.  HEENT: Denies photophobia, eye pain, redness, hearing loss, ear pain, congestion, sore throat, rhinorrhea, sneezing, mouth sores, trouble swallowing, neck pain, neck stiffness and tinnitus.  Respiratory: Denies SOB, DOE, cough,  chest tightness, and wheezing.  Cardiovascular: Denies chest pain, palpitations and leg swelling.  Gastrointestinal: Denies nausea, vomiting, abdominal pain, diarrhea, constipation, blood in stool and abdominal distention.  Genitourinary: Denies dysuria, urgency, frequency, hematuria, flank pain and difficulty urinating.  Musculoskeletal: Denies myalgias, back pain, joint swelling, arthralgias and gait problem.  Skin: Denies pallor, rash and wound.  Neurological: Denies dizziness, seizures, syncope, weakness, light-headedness, numbness and headaches.  Hematological: Denies adenopathy. Easy bruising, personal or family bleeding history  Psychiatric/Behavioral: Denies suicidal ideation, mood changes, confusion, nervousness, sleep disturbance and agitation       Past Medical History  Diagnosis Date  . Glaucoma(365)   . Anxiety   . GERD (gastroesophageal reflux disease)   . Hyperlipidemia   . Hypertension   . Chronic abdominal pain   . Mesothelioma   . 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 Chronic respiratory failure   . DM type 2 (diabetes mellitus, type 2) 06/13/2012  . Altered mental status 06/12/2012    Unwitnessed isolated seizure suspected, but because of the EEG being normal, Dr. Gerilyn Pilgrim did not recommend antiseizure therapy.  . Hard of hearing   . Seizure 06/14/2012    Isolated seizure suspected, but not confirmed.  . Bradycardia   . Mesothelioma      Past Surgical History  Procedure Date  . Hernia repair   . Prostate surgery   . Right eye laser surgery 2005  . Left eye surgery for glaucoma at [facility] 10/2009      Social History:  reports that he has quit smoking. He  does not have any smokeless tobacco history on file. He reports that he does not drink alcohol or use illicit drugs.    No Known Allergies  Family History  Problem Relation Age of Onset  . Lung cancer Sister      Prior to Admission medications   Medication Sig Start Date End  Date Taking? Authorizing Provider  albuterol (PROVENTIL) (2.5 MG/3ML) 0.083% nebulizer solution Take 2.5 mg by nebulization 4 (four) times daily.   Yes Historical Provider, MD  amLODipine (NORVASC) 2.5 MG tablet Take 1 tablet (2.5 mg total) by mouth daily. 07/18/12 07/18/13 Yes Kerri Perches, MD  brimonidine (ALPHAGAN P) 0.1 % SOLN Place 1 drop into both eyes at bedtime.    Yes Historical Provider, MD  Dorzolamide HCl-Timolol Mal PF 22.3-6.8 MG/ML SOLN Apply 1 drop to eye 2 (two) times daily.    Yes Historical Provider, MD  hydrOXYzine (ATARAX/VISTARIL) 25 MG tablet Take 25 mg by mouth at bedtime.   Yes Historical Provider, MD  latanoprost (XALATAN) 0.005 % ophthalmic solution Place 1 drop into the left eye at bedtime.    Yes Historical Provider, MD  lovastatin (MEVACOR) 40 MG tablet Take 40 mg by mouth at bedtime.     Yes Historical Provider, MD  metFORMIN (GLUCOPHAGE) 500 MG tablet Take 1 tablet (500 mg total) by mouth 2 (two) times daily with a meal. 04/16/12 04/16/13 Yes Kerri Perches, MD  montelukast (SINGULAIR) 10 MG tablet Take 10 mg by mouth at bedtime.     Yes Historical Provider, MD  Multiple Vitamins-Minerals (MULTIVITAMIN WITH MINERALS) tablet Take 1 tablet by mouth daily. 06/14/12  Yes Elliot Cousin, MD  omeprazole (PRILOSEC) 20 MG capsule Take 20 mg by mouth daily.   Yes Historical Provider, MD  oxybutynin (DITROPAN-XL) 5 MG 24 hr tablet Take 5 mg by mouth daily.     Yes Historical Provider, MD  pilocarpine (PILOCAR) 1 % ophthalmic solution Place 1 drop into both eyes Daily.  07/14/12  Yes Historical Provider, MD  rivastigmine (EXELON) 4.6 mg/24hr Place 1 patch onto the skin daily.   Yes Historical Provider, MD  senna-docusate (DOC-Q-LAX) 8.6-50 MG per tablet Take 1 tablet by mouth 2 (two) times daily.   Yes Historical Provider, MD  Vitamin D, Ergocalciferol, (DRISDOL) 50000 UNITS CAPS Take 50,000 Units by mouth every 7 (seven) days. tuesdays   Yes Historical Provider, MD      Physical Exam: Filed Vitals:   08/14/12 0828 08/14/12 0852 08/14/12 0951  BP:  134/87 129/66  Pulse:  90 89  Temp:  98.6 F (37 C) 98.6 F (37 C)  TempSrc:  Oral Rectal  Resp:  20 20  SpO2: 98% 99% 99%     Constitutional: Vital signs reviewed. Patient is a well-developed and well-nourished in no acute distress and cooperative with exam. Alert and oriented x3.  Head: Normocephalic and atraumatic  Ear: TM normal bilaterally  Mouth: no erythema or exudates, MMM  Eyes: PERRL, EOMI, conjunctivae normal, No scleral icterus.  Neck: Supple, Trachea midline normal ROM, No JVD, mass, thyromegaly, or carotid bruit present.  Cardiovascular: RRR, S1 normal, S2 normal, no MRG, pulses symmetric and intact bilaterally  Pulmonary/Chest: CTAB, no wheezes, rales, or rhonchi  Abdominal: Soft. Non-tender, non-distended, bowel sounds are normal, no masses, organomegaly, or guarding present.  GU: no CVA tenderness Musculoskeletal: No joint deformities, erythema, or stiffness, ROM full and no nontender Ext: no edema and no cyanosis, pulses palpable bilaterally (DP and PT)  Hematology: no cervical, inginal,  or axillary adenopathy.  Neurological: He is alert. No cranial nerve deficit. He exhibits normal muscle tone. Coordination normal.  Patient answers questions, however is disoriented.  Skin: Warm, dry and intact. No rash, cyanosis, or clubbing.  Psychiatric: Normal mood and affect. speech and behavior is normal. Judgment and thought content normal. Cognition and memory are normal.       Labs on Admission:    Basic Metabolic Panel:  Lab 08/14/12 6644 08/13/12 1821  NA 138 136  K 4.7 4.4  CL 98 98  CO2 32 31  GLUCOSE 110* 106*  BUN 14 12  CREATININE 1.22 1.07  CALCIUM 10.6* 10.6*  MG -- --  PHOS -- --   Liver Function Tests: No results found for this basename: AST:5,ALT:5,ALKPHOS:5,BILITOT:5,PROT:5,ALBUMIN:5 in the last 168 hours No results found for this basename:  LIPASE:5,AMYLASE:5 in the last 168 hours No results found for this basename: AMMONIA:5 in the last 168 hours CBC:  Lab 08/14/12 0945 08/13/12 1821  WBC 8.6 8.5  NEUTROABS 6.1 5.3  HGB 11.4* 11.2*  HCT 38.5* 37.3*  MCV 88.9 89.7  PLT 296 288   Cardiac Enzymes: No results found for this basename: CKTOTAL:5,CKMB:5,CKMBINDEX:5,TROPONINI:5 in the last 168 hours  BNP (last 3 results) No results found for this basename: PROBNP:3 in the last 8760 hours    CBG: No results found for this basename: GLUCAP:5 in the last 168 hours  Radiological Exams on Admission: Dg Chest 2 View  08/14/2012  *RADIOLOGY REPORT*  Clinical Data: Altered mental status, shortness of breath  CHEST - 2 VIEW  Comparison: Multiple prior chest radiograph performed between May 8 and December 2013 plus chest CT 04/16/2012  Findings: There is stable mild cardiomegaly.  The vascular pattern is within normal limits.  Multiple bilateral pleural based nodules are noted to be present some of which are again identified by radiography.  There is no change in these findings.  There is no infiltrate or effusion.  IMPRESSION: No acute findings.  Stable mild cardiomegaly.  Multiple bilateral stable pleural based nodules.   Original Report Authenticated By: Esperanza Heir, M.D.    Ct Head Wo Contrast  08/14/2012  *RADIOLOGY REPORT*  Clinical Data: Behavioral changes, history of Alzheimer's  CT HEAD WITHOUT CONTRAST  Technique:  Contiguous axial images were obtained from the base of the skull through the vertex without contrast.  Comparison: 07/24/2012  Findings: Calvarium is intact.  There is stable diffuse cortical atrophy.  There is no evidence of vascular territory infarct, hemorrhage, or mass.  There is no change from the prior study.  IMPRESSION: No acute findings.   Original Report Authenticated By: Esperanza Heir, M.D.    Dg Chest Port 1 View  08/13/2012  *RADIOLOGY REPORT*  Clinical Data: Shortness of breath and altered mental  status. History of mesothelioma.  PORTABLE CHEST - 1 VIEW  Comparison: 07/24/2012  Findings: Chronic lung disease and pleural-based nodularity is stable.  The heart size is stable.  There is stable tortuosity of the thoracic aorta.  No infiltrates, edema or pleural fluid identified.  IMPRESSION: No acute findings.  Stable chronic lung disease and pleural nodules.   Original Report Authenticated By: Irish Lack, M.D.     EKG: Independently reviewed.    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 Altered mental status probably secondary to urinary tract infection,  he has been started on cefepime. CT head was negative. This is likely secondary to infectious encephalopathy 2. Dementia: Continue with current dose of Exelon patch, Xanax was discontinued yesterday, monitor for withdrawal. Will consider low-dose seroquel, once stable from a UTI 3. Diabetes hold metformin and start the patient sliding scale insulin 4.   Code Status:  DO NOT RESUSCITATE Family Communication: bedside Disposition Plan: admit   Time spent: 70 mins   [facility] Pager [phone]  If 7PM-7AM, please contact night-coverage [email or web address] Password [email or web address] 08/14/2012, 1:59 PM

## 2012-08-14 NOTE — ED Notes (Signed)
Phlebotomy at the bedside  

## 2012-08-14 NOTE — ED Provider Notes (Signed)
I saw and evaluated the patient, reviewed the resident's note and I agree with the findings and plan.  Pt with a UTI, some AMS reportedly. 0 SIRS criteria, dodnt suspect urosepsis. Will admit for IVAB and appropriate tx per cultures.   Derwood Kaplan, MD 08/14/12 [phone]

## 2012-08-14 NOTE — ED Notes (Signed)
Per pt's daughter. Pt has been confused more and combative since yesterday. He went to [facility] and they decreased his patch for dementia from 16mg  to 4mg , and decreased his xanax. When pt returned to home he became increasingly more confused and his condition had worsened. Pt was not sleeping.

## 2012-08-15 DIAGNOSIS — R4182 Altered mental status, unspecified: Secondary | ICD-10-CM

## 2012-08-15 DIAGNOSIS — F0390 Unspecified dementia without behavioral disturbance: Secondary | ICD-10-CM

## 2012-08-15 DIAGNOSIS — N390 Urinary tract infection, site not specified: Secondary | ICD-10-CM

## 2012-08-15 LAB — TSH: TSH: 1.899 u[IU]/mL (ref 0.350–4.500)

## 2012-08-15 LAB — GLUCOSE, CAPILLARY: Glucose-Capillary: 119 mg/dL — ABNORMAL HIGH (ref 70–99)

## 2012-08-15 LAB — CBC
MCH: 26.4 pg (ref 26.0–34.0)
MCHC: 30.1 g/dL (ref 30.0–36.0)
Platelets: 278 10*3/uL (ref 150–400)
RDW: 14.8 % (ref 11.5–15.5)

## 2012-08-15 LAB — TROPONIN I: Troponin I: <0.3 ng/mL (ref <0.30)

## 2012-08-15 MED ORDER — LORAZEPAM 2 MG/ML IJ SOLN: 1.0000 mg | Freq: Four times a day (QID) | INTRAMUSCULAR | Status: DC | PRN

## 2012-08-15 MED ORDER — HALOPERIDOL 2 MG PO TABS
2.0000 mg | ORAL_TABLET | Freq: Four times a day (QID) | ORAL | Status: DC | PRN
  Filled 2012-08-15: qty 1

## 2012-08-15 MED ORDER — LORAZEPAM 2 MG/ML IJ SOLN
1.0000 mg | Freq: Once | INTRAMUSCULAR | Status: AC
  Administered 2012-08-15: 1 mg via INTRAVENOUS
  Filled 2012-08-15: qty 1

## 2012-08-15 MED ORDER — LORAZEPAM 2 MG/ML IJ SOLN
INTRAMUSCULAR | Status: AC
  Administered 2012-08-15: 1 mg via INTRAVENOUS
  Filled 2012-08-15: qty 1

## 2012-08-15 MED ORDER — HALOPERIDOL LACTATE 5 MG/ML IJ SOLN
2.0000 mg | Freq: Four times a day (QID) | INTRAMUSCULAR | Status: DC | PRN
  Administered 2012-08-17 (×2): 2 mg via INTRAMUSCULAR
  Filled 2012-08-15 (×4): qty 0.4

## 2012-08-15 MED ORDER — LORAZEPAM 2 MG/ML IJ SOLN
1.0000 mg | Freq: Four times a day (QID) | INTRAMUSCULAR | Status: DC | PRN
  Administered 2012-08-15 – 2012-08-20 (×16): 1 mg via INTRAVENOUS
  Filled 2012-08-15 (×15): qty 1

## 2012-08-15 MED ORDER — QUETIAPINE FUMARATE 25 MG PO TABS
25.0000 mg | ORAL_TABLET | Freq: Every day | ORAL | Status: DC
  Administered 2012-08-15: 25 mg via ORAL
  Filled 2012-08-15 (×3): qty 1

## 2012-08-15 MED ORDER — SODIUM CHLORIDE 0.9 % IV SOLN
INTRAVENOUS | Status: DC
  Administered 2012-08-15: 100 mL/h via INTRAVENOUS
  Administered 2012-08-16 – 2012-08-19 (×4): via INTRAVENOUS

## 2012-08-15 MED ORDER — HALOPERIDOL 5 MG PO TABS
5.0000 mg | ORAL_TABLET | Freq: Once | ORAL | Status: AC
  Administered 2012-08-15: 5 mg via ORAL
  Filled 2012-08-15: qty 1

## 2012-08-15 NOTE — Progress Notes (Signed)
Pt became combative and agitated when lab came to draw blood, it took multiple nurses to attempt to calm the pt. Pt is still very agitated, less restrictive techniques have been used. MD on call notified.   [name] [name] M

## 2012-08-15 NOTE — Clinical Social Work Psychosocial (Signed)
Clinical Social Work Department BRIEF PSYCHOSOCIAL ASSESSMENT 08/15/2012  Patient:  [name] [name], [name] [name]     Account Number:  [id number]     Admit date:  08/14/2012  Clinical Social Worker:  Thomasene Mohair  Date/Time:  08/15/2012 11:00 AM  Referred by:  Physician  Date Referred:  08/15/2012 Referred for  Other - See comment   Other Referral:   DC planning, Pt from ALF possible LOC change to SNF at dc.   Interview type:  Family Other interview type:   Pt has acute AMS at this time    PSYCHOSOCIAL DATA Living Status:  FACILITY Admitted from facility:   Level of care:  Assisted Living Primary support name:  Toniann Fail  [phone]   [phone] Primary support relationship to patient:  CHILD, ADULT Degree of support available:   Pt has 8 children (5 daughters, 3 sons) who are involved in his life.  Toniann Fail handles most of the doctors appts and paperwork per his daugther Dois Davenport.    CURRENT CONCERNS Current Concerns  Post-Acute Placement   Other Concerns:   Goals of Care    SOCIAL WORK ASSESSMENT / PLAN CSW was referred to Pt to assist with dc planning. Pt is a long-term ALF resident who was just moved to a newer ALF, [facility], so that he would have an increased level of supervision. Pt was "able to do for himself" prior to admission per daughter Sandra's report.  Pt is confused at this time, agitated, and refusing medications. Pt's daughter Dois Davenport stated that Pt would not be able to return to ALF in his current state and that if SNF is necessary, they would prefer [place].  CSW will continue to f/u as Pt's care progresses.  Pt's daugther mentioned that they have signed papers for Pt to be a DNR in the past.   Assessment/plan status:  Psychosocial Support/Ongoing Assessment of Needs Other assessment/ plan:   Information/referral to community resources:    PATIENT'S/FAMILY'S RESPONSE TO PLAN OF CARE: Pt unable to participate in DC planning d/t acute AMS.  Pt's daughter Dois Davenport was at bedside offering Pt support and assiting nurse as able with father's agitation.  Pt's daughter shared that Pt's wife of 52 yrs died quickly from ovarian cancer in 2007 and that she made the daughters "promise to take care of our father." Pt has 1 other sibling who is living in [place], but has lost 8 other siblings to cancer.   Frederico Hamman, LCSW [phone]

## 2012-08-15 NOTE — Progress Notes (Signed)
Pt was not placed in restraints,pt calmed down with redirecting and active listening techniques.  [name] [name] M

## 2012-08-15 NOTE — Progress Notes (Signed)
Pt very anxious and agitated, activity interferes with q4 neuro checks a well as vital signs checks.  [name] [name] M

## 2012-08-15 NOTE — Progress Notes (Signed)
[facility] PROGRESS NOTE  [name] [name] ZOX:[id number] DOB: Jul 13, 1931 DOA: 08/14/2012 PCP: Syliva Overman, MD   Assessment/Plan: Altered mental status  probably secondary to urinary tract infection, he has been started on cefepime ( day 2). CT head was negative.  Patient quite agitated and delirious. Will avoid narcotics try minimizing benzos. Haldol prn for agitation.  UTI  Cx growing proteus. Follow sensitivity.continue cefepime.  Dementia:  Continue with current dose of Exelon patch, Xanax was discontinued on admission, monitor for withdrawal.  Will add low dose seroquel. Monitor Qtc  Diabetes mellitus holding  metformin  Continue  sliding scale insulin   Code Status: full Family Communication: family at bedside Disposition Plan: home once stable   Consultants:  none  Procedures:  none  Antibiotics: IV cefepime>> 12/5  HPI/Subjective: Patient quite Agitated since overnight and confused wanting to leave. He was tachycardic as well. Restrains placed and haldol given after which he calmed down for some time.  Objective: Filed Vitals:   08/14/12 1415 08/14/12 2100 08/15/12 0336 08/15/12 1034  BP: 127/74 101/75 140/73 144/85  Pulse: 111 93 119   Temp:      TempSrc:      Resp: 23 22    SpO2: 99%      No intake or output data in the 24 hours ending 08/15/12 1511 There were no vitals filed for this visit.  Exam:   General:  Elderly male agitated in bed and restless  HEENT: no pallor, dry oral mucosa  Cardiovascular:  SI &S2 tachycardic  Respiratory: clear b/l no added sounds  Abdomen: soft, NT, ND, BS+  Ext: warm, no edema   CNS: AAOX1 , agitated  Data Reviewed: Basic Metabolic Panel:  Lab 08/14/12 8119 08/14/12 0945 08/13/12 1821  NA -- 138 136  K -- 4.7 4.4  CL -- 98 98  CO2 -- 32 31  GLUCOSE -- 110* 106*  BUN -- 14 12  CREATININE 1.12 1.22 1.07  CALCIUM -- 10.6* 10.6*  MG -- -- --  PHOS -- -- --   Liver Function  Tests:  Lab 08/14/12 1802 08/14/12 0945  AST 18 --  ALT 14 --  ALKPHOS 69 --  BILITOT 0.3 --  PROT 7.4 --  ALBUMIN 3.6 3.7   No results found for this basename: LIPASE:5,AMYLASE:5 in the last 168 hours No results found for this basename: AMMONIA:5 in the last 168 hours CBC:  Lab 08/14/12 1802 08/14/12 0945 08/13/12 1821  WBC 9.5 8.6 8.5  NEUTROABS -- 6.1 5.3  HGB 10.9* 11.4* 11.2*  HCT 36.5* 38.5* 37.3*  MCV 89.5 88.9 89.7  PLT 286 296 288   Cardiac Enzymes:  Lab 08/14/12 2355 08/14/12 1754  CKTOTAL -- --  CKMB -- --  CKMBINDEX -- --  TROPONINI <0.30 <0.30   BNP (last 3 results) No results found for this basename: PROBNP:3 in the last 8760 hours CBG:  Lab 08/14/12 1616  GLUCAP 86    Recent Results (from the past 240 hour(s))  URINE CULTURE     Status: Normal (Preliminary result)   Collection Time   08/13/12  8:49 PM      Component Value Range Status Comment   Specimen Description URINE, CATHETERIZED   Final    Special Requests NONE   Final    Culture  Setup Time 08/13/2012 20:45   Final    Colony Count PENDING   Incomplete    Culture Culture reincubated for better growth   Final    Report  Status PENDING   Incomplete   URINE CULTURE     Status: Normal (Preliminary result)   Collection Time   08/14/12 11:08 AM      Component Value Range Status Comment   Specimen Description URINE, CLEAN CATCH   Final    Special Requests NONE   Final    Culture  Setup Time 08/14/2012 12:42   Final    Colony Count >=100,000 COLONIES/ML   Final    Culture PROTEUS MIRABILIS   Final    Report Status PENDING   Incomplete      Studies: Dg Chest 2 View  08/14/2012  *RADIOLOGY REPORT*  Clinical Data: Altered mental status, shortness of breath  CHEST - 2 VIEW  Comparison: Multiple prior chest radiograph performed between May 8 and December 2013 plus chest CT 04/16/2012  Findings: There is stable mild cardiomegaly.  The vascular pattern is within normal limits.  Multiple bilateral  pleural based nodules are noted to be present some of which are again identified by radiography.  There is no change in these findings.  There is no infiltrate or effusion.  IMPRESSION: No acute findings.  Stable mild cardiomegaly.  Multiple bilateral stable pleural based nodules.   Original Report Authenticated By: Esperanza Heir, M.D.    Ct Head Wo Contrast  08/14/2012  *RADIOLOGY REPORT*  Clinical Data: Behavioral changes, history of Alzheimer's  CT HEAD WITHOUT CONTRAST  Technique:  Contiguous axial images were obtained from the base of the skull through the vertex without contrast.  Comparison: 07/24/2012  Findings: Calvarium is intact.  There is stable diffuse cortical atrophy.  There is no evidence of vascular territory infarct, hemorrhage, or mass.  There is no change from the prior study.  IMPRESSION: No acute findings.   Original Report Authenticated By: Esperanza Heir, M.D.    Dg Chest Port 1 View  08/13/2012  *RADIOLOGY REPORT*  Clinical Data: Shortness of breath and altered mental status. History of mesothelioma.  PORTABLE CHEST - 1 VIEW  Comparison: 07/24/2012  Findings: Chronic lung disease and pleural-based nodularity is stable.  The heart size is stable.  There is stable tortuosity of the thoracic aorta.  No infiltrates, edema or pleural fluid identified.  IMPRESSION: No acute findings.  Stable chronic lung disease and pleural nodules.   Original Report Authenticated By: Irish Lack, M.D.     Scheduled Meds:   . albuterol  2.5 mg Nebulization Q6H  . amLODipine  2.5 mg Oral Daily  . brimonidine  1 drop Both Eyes QHS  . ceFEPime (MAXIPIME) IV  1 g Intravenous Q12H  . dorzolamide-timolol  1 drop Both Eyes BID  . enoxaparin (LOVENOX) injection  40 mg Subcutaneous Q24H  . [COMPLETED] haloperidol  5 mg Oral Once  . insulin aspart  0-15 Units Subcutaneous TID WC  . latanoprost  1 drop Left Eye QHS  . [COMPLETED] LORazepam  1 mg Intravenous Once  . montelukast  10 mg Oral QHS  .  oxybutynin  5 mg Oral Daily  . rivastigmine  4.6 mg Transdermal Daily  . senna-docusate  1 tablet Oral BID  . simvastatin  5 mg Oral q1800  . sodium chloride  500 mL Intravenous Once  . sodium chloride  3 mL Intravenous Q12H  . [DISCONTINUED] brimonidine  1 drop Both Eyes QHS  . [DISCONTINUED] brimonidine  1 drop Both Eyes QHS  . [DISCONTINUED] Dorzolamide HCl-Timolol Mal PF  1 drop Ophthalmic BID  . [DISCONTINUED] Dorzolamide HCl-Timolol Mal PF  1 drop Ophthalmic  BID   Continuous Infusions:   . sodium chloride 100 mL/hr (08/15/12 1210)      Time spent: 30 minutes    Shakyra Mattera  [facility] Pager [phone]. If 8PM-8AM, please contact night-coverage at [email or web address], password [facility] 08/15/2012, 3:11 PM  LOS: 1 day

## 2012-08-15 NOTE — Progress Notes (Signed)
RT gave patient treatment, but had to get help from family. Patient is combative, with family's help treatment was given. RT will continue to monitor.

## 2012-08-16 LAB — BASIC METABOLIC PANEL
BUN: 14 mg/dL (ref 6–23)
Creatinine, Ser: 1.1 mg/dL (ref 0.50–1.35)
GFR calc Af Amer: 72 mL/min — ABNORMAL LOW (ref >90)
GFR calc non Af Amer: 62 mL/min — ABNORMAL LOW (ref >90)

## 2012-08-16 LAB — URINE CULTURE

## 2012-08-16 LAB — CBC
HCT: 33 % — ABNORMAL LOW (ref 39.0–52.0)
MCHC: 30 g/dL (ref 30.0–36.0)
MCV: 88.7 fL (ref 78.0–100.0)
RDW: 14.8 % (ref 11.5–15.5)

## 2012-08-16 MED ORDER — QUETIAPINE FUMARATE 25 MG PO TABS
25.0000 mg | ORAL_TABLET | Freq: Two times a day (BID) | ORAL | Status: DC
  Administered 2012-08-16 – 2012-08-18 (×5): 25 mg via ORAL
  Filled 2012-08-16 (×6): qty 1

## 2012-08-16 NOTE — Progress Notes (Signed)
[facility] PROGRESS NOTE  [name] [name]:[id number] DOB: [birth year] DOA: 08/14/2012 PCP: Syliva Overman, MD  Assessment/Plan:   Altered mental status  probably secondary to urinary tract infection, however has persistent delirium with underlying dementia . he has been started on cefepime ( day 3). CT head was negative.  -was admitted at [facility] 3 weeks back for dementia with delusional symptoms. EEG done there was negative for seizure activity. Patient quite agitated and delirious. avoid narcotics try minimizing benzos. Haldol prn for agitation. Added Seroquel bid and qhs . Monitor QTc - family at bedside mention patient to have moderate dementia but no agitation or delirium at baseline. He is on exelon patch which can cause delirium, agitation and  hallucinations in over 5 % cases. -i will discontinue his exelon patch.  UTI  Cx growing proteus. Follow sensitivity.continue cefepime. ( day 3)   Dementia:  Continue with current dose of Exelon patch, Xanax was discontinued on admission, monitor for withdrawal.  Will add low dose seroquel. Monitor Qtc   Diabetes mellitus  holding metformin  Continue sliding scale insulin      Code Status: DNR Family Communication: family at bedside Disposition Plan: home once stable   Consultants:  none  Procedures:  none  Antibiotics:  Cefepime ( day 3)   HPI/Subjective: Still agitated at times and very confused   Objective: Filed Vitals:   08/15/12 2028 08/15/12 2140 08/16/12 0633 08/16/12 0900  BP:  132/67 114/75   Pulse:  89 96   Temp:   98.3 F (36.8 C)   TempSrc:  Oral    Resp:  19 19   SpO2: 99% 99% 99% 95%   No intake or output data in the 24 hours ending 08/16/12 1005 There were no vitals filed for this visit.  Exam:  General: Elderly male agitated in bed and restless  HEENT: no pallor, dry oral mucosa  Cardiovascular: SI &S2 tachycardic  Respiratory: clear b/l no added sounds  Abdomen:  soft, NT, ND, BS+  Ext: warm, no edema  CNS: AAOX1 , agitated and delirious    Data Reviewed: Basic Metabolic Panel:  Lab 08/16/12 8119 08/14/12 1802 08/14/12 0945 08/13/12 1821  NA 139 -- 138 136  K 4.5 -- 4.7 4.4  CL 103 -- 98 98  CO2 28 -- 32 31  GLUCOSE 101* -- 110* 106*  BUN 14 -- 14 12  CREATININE 1.10 1.12 1.22 1.07  CALCIUM 9.7 -- 10.6* 10.6*  MG -- -- -- --  PHOS -- -- -- --   Liver Function Tests:  Lab 08/14/12 1802 08/14/12 0945  AST 18 --  ALT 14 --  ALKPHOS 69 --  BILITOT 0.3 --  PROT 7.4 --  ALBUMIN 3.6 3.7   No results found for this basename: LIPASE:5,AMYLASE:5 in the last 168 hours No results found for this basename: AMMONIA:5 in the last 168 hours CBC:  Lab 08/16/12 0440 08/15/12 1442 08/14/12 1802 08/14/12 0945 08/13/12 1821  WBC 8.5 10.3 9.5 8.6 8.5  NEUTROABS -- -- -- 6.1 5.3  HGB 9.9* 10.6* 10.9* 11.4* 11.2*  HCT 33.0* 35.2* 36.5* 38.5* 37.3*  MCV 88.7 87.6 89.5 88.9 89.7  PLT 258 278 286 296 288   Cardiac Enzymes:  Lab 08/15/12 1441 08/14/12 2355 08/14/12 1754  CKTOTAL -- -- --  CKMB -- -- --  CKMBINDEX -- -- --  TROPONINI <0.30 <0.30 <0.30   BNP (last 3 results) No results found for this basename: PROBNP:3 in the last 8760  hours CBG:  Lab 08/16/12 0627 08/15/12 2132 08/15/12 1616 08/14/12 1616  GLUCAP 103* 148* 119* 86    Recent Results (from the past 240 hour(s))  URINE CULTURE     Status: Normal (Preliminary result)   Collection Time   08/13/12  8:49 PM      Component Value Range Status Comment   Specimen Description URINE, CATHETERIZED   Final    Special Requests NONE   Final    Culture  Setup Time 08/13/2012 20:45   Final    Colony Count 25,000 COLONIES/ML   Final    Culture PRIM   Final    Report Status PENDING   Incomplete   URINE CULTURE     Status: Normal (Preliminary result)   Collection Time   08/14/12 11:08 AM      Component Value Range Status Comment   Specimen Description URINE, CLEAN CATCH   Final    Special  Requests NONE   Final    Culture  Setup Time 08/14/2012 12:42   Final    Colony Count >=100,000 COLONIES/ML   Final    Culture PROTEUS MIRABILIS   Final    Report Status PENDING   Incomplete      Studies: Dg Chest 2 View  08/14/2012  *RADIOLOGY REPORT*  Clinical Data: Altered mental status, shortness of breath  CHEST - 2 VIEW  Comparison: Multiple prior chest radiograph performed between May 8 and December 2013 plus chest CT 04/16/2012  Findings: There is stable mild cardiomegaly.  The vascular pattern is within normal limits.  Multiple bilateral pleural based nodules are noted to be present some of which are again identified by radiography.  There is no change in these findings.  There is no infiltrate or effusion.  IMPRESSION: No acute findings.  Stable mild cardiomegaly.  Multiple bilateral stable pleural based nodules.   Original Report Authenticated By: Esperanza Heir, M.D.    Ct Head Wo Contrast  08/14/2012  *RADIOLOGY REPORT*  Clinical Data: Behavioral changes, history of Alzheimer's  CT HEAD WITHOUT CONTRAST  Technique:  Contiguous axial images were obtained from the base of the skull through the vertex without contrast.  Comparison: 07/24/2012  Findings: Calvarium is intact.  There is stable diffuse cortical atrophy.  There is no evidence of vascular territory infarct, hemorrhage, or mass.  There is no change from the prior study.  IMPRESSION: No acute findings.   Original Report Authenticated By: Esperanza Heir, M.D.     Scheduled Meds:   . albuterol  2.5 mg Nebulization Q6H  . amLODipine  2.5 mg Oral Daily  . brimonidine  1 drop Both Eyes QHS  . ceFEPime (MAXIPIME) IV  1 g Intravenous Q12H  . dorzolamide-timolol  1 drop Both Eyes BID  . enoxaparin (LOVENOX) injection  40 mg Subcutaneous Q24H  . [COMPLETED] haloperidol  5 mg Oral Once  . insulin aspart  0-15 Units Subcutaneous TID WC  . latanoprost  1 drop Left Eye QHS  . montelukast  10 mg Oral QHS  . oxybutynin  5 mg Oral Daily   . QUEtiapine  25 mg Oral QHS  . QUEtiapine  25 mg Oral BID  . senna-docusate  1 tablet Oral BID  . simvastatin  5 mg Oral q1800  . sodium chloride  500 mL Intravenous Once  . sodium chloride  3 mL Intravenous Q12H  . [DISCONTINUED] rivastigmine  4.6 mg Transdermal Daily   Continuous Infusions:   . sodium chloride 100 mL/hr (08/15/12 1210)  Time spent: 25 minutes    Eddie North  [facility] Pager [phone] If 8PM-8AM, please contact night-coverage at [email or web address], password [facility] 08/16/2012, 10:05 AM  LOS: 2 days

## 2012-08-16 NOTE — Progress Notes (Signed)
Removed restraints from BUE and BLE. Patient was fine while he slept. Patient become agitated when he had a bowel movement and staff tried to clean him up. Patient was trying to stand up and get out of bed. Notified MD and restraint order was reordered. Lajuana Matte, RN

## 2012-08-17 DIAGNOSIS — F22 Delusional disorders: Secondary | ICD-10-CM

## 2012-08-17 LAB — URINE CULTURE: Colony Count: 25000

## 2012-08-17 LAB — GLUCOSE, CAPILLARY
Glucose-Capillary: 97 mg/dL (ref 70–99)
Glucose-Capillary: 87 mg/dL (ref 70–99)
Glucose-Capillary: 75 mg/dL (ref 70–99)

## 2012-08-17 MED ORDER — DEXTROSE 5 % IV SOLN
1.0000 g | INTRAVENOUS | Status: DC
  Administered 2012-08-17 – 2012-08-20 (×4): 1 g via INTRAVENOUS
  Filled 2012-08-17 (×4): qty 10

## 2012-08-17 MED ORDER — ALBUTEROL SULFATE (5 MG/ML) 0.5% IN NEBU: 2.5000 mg | INHALATION_SOLUTION | RESPIRATORY_TRACT | Status: DC | PRN

## 2012-08-17 NOTE — Progress Notes (Signed)
[facility] PROGRESS NOTE  [name] [name] WGN:[id number] DOB: June 23, 1931 DOA: 08/14/2012 PCP: Syliva Overman, MD  Assessment/Plan:  Altered mental status  initially thought secondary to urinary tract infection, however has persistent delirium with underlying dementia . he has been started on cefepime ( day 4). CT head was negative.  -was admitted at [facility] 3 weeks back for dementia with delusional symptoms. EEG done there was negative for seizure activity.  Patient quite agitated and delirious. avoid narcotics try minimizing benzos. Haldol prn for agitation. Added Seroquel bid and qhs . Monitor QTc  - family mentioned patient to have moderate dementia but no agitation or delirium at baseline. He is on exelon patch which can cause delirium, agitation and hallucinations in over 5 % cases.  discontinued his exelon patch.   UTI  Cx growing proteus. Mostly sensitive. Will switch to rocephin.( day 4 of abx)  Dementia:  D/c Exelon patch, Xanax was discontinued on admission, monitor for withdrawal.  Added scheduled Seroquel tid. patient still agitated when waken up for vitals, cleaning up or meals. Will continue prn haldol for now.   Diabetes mellitus  holding metformin  Continue sliding scale insulin    Code Status: DNR  Family Communication: family at bedside  Disposition Plan: home once stable   Consultants:  none Procedures:  none Antibiotics:  Cefepime ( day 3)  HPI/Subjective:  Still agitated at times and very confused    Objective: Filed Vitals:   08/16/12 1404 08/16/12 1446 08/16/12 1933 08/17/12 0537  BP: 100/64  160/85 146/85  Pulse: 86  108 84  Temp: 97.3 F (36.3 C)     TempSrc:      Resp: 17  16 18   Height:      Weight:      SpO2:  95% 94% 92%    Intake/Output Summary (Last 24 hours) at 08/17/12 1246 Last data filed at 08/17/12 0500  Gross per 24 hour  Intake 3223.33 ml  Output   1800 ml  Net 1423.33 ml   Filed Weights   08/16/12 0850   Weight: 78.881 kg (173 lb 14.4 oz)    Exam:  General: Elderly male agitated in bed and restless  HEENT: no pallor, dry oral mucosa  Cardiovascular: SI &S2 tachycardic  Respiratory: clear b/l no added sounds  Abdomen: soft, NT, ND, BS+  Ext: warm, no edema  CNS: AAOX1 , agitated and delirious    Data Reviewed: Basic Metabolic Panel:  Lab 08/16/12 7846 08/14/12 1802 08/14/12 0945 08/13/12 1821  NA 139 -- 138 136  K 4.5 -- 4.7 4.4  CL 103 -- 98 98  CO2 28 -- 32 31  GLUCOSE 101* -- 110* 106*  BUN 14 -- 14 12  CREATININE 1.10 1.12 1.22 1.07  CALCIUM 9.7 -- 10.6* 10.6*  MG -- -- -- --  PHOS -- -- -- --   Liver Function Tests:  Lab 08/14/12 1802 08/14/12 0945  AST 18 --  ALT 14 --  ALKPHOS 69 --  BILITOT 0.3 --  PROT 7.4 --  ALBUMIN 3.6 3.7   No results found for this basename: LIPASE:5,AMYLASE:5 in the last 168 hours No results found for this basename: AMMONIA:5 in the last 168 hours CBC:  Lab 08/16/12 0440 08/15/12 1442 08/14/12 1802 08/14/12 0945 08/13/12 1821  WBC 8.5 10.3 9.5 8.6 8.5  NEUTROABS -- -- -- 6.1 5.3  HGB 9.9* 10.6* 10.9* 11.4* 11.2*  HCT 33.0* 35.2* 36.5* 38.5* 37.3*  MCV 88.7 87.6 89.5 88.9 89.7  PLT 258 278 286 296 288   Cardiac Enzymes:  Lab 08/15/12 1441 08/14/12 2355 08/14/12 1754  CKTOTAL -- -- --  CKMB -- -- --  CKMBINDEX -- -- --  TROPONINI <0.30 <0.30 <0.30   BNP (last 3 results) No results found for this basename: PROBNP:3 in the last 8760 hours CBG:  Lab 08/17/12 1113 08/17/12 0828 08/17/12 0630 08/16/12 2128 08/16/12 1109  GLUCAP 134* 87 97 109* 96    Recent Results (from the past 240 hour(s))  URINE CULTURE     Status: Normal (Preliminary result)   Collection Time   08/13/12  8:49 PM      Component Value Range Status Comment   Specimen Description URINE, CATHETERIZED   Final    Special Requests NONE   Final    Culture  Setup Time 08/13/2012 20:45   Final    Colony Count 25,000 COLONIES/ML   Final    Culture PRIM    Final    Report Status PENDING   Incomplete   URINE CULTURE     Status: Normal   Collection Time   08/14/12 11:08 AM      Component Value Range Status Comment   Specimen Description URINE, CLEAN CATCH   Final    Special Requests NONE   Final    Culture  Setup Time 08/14/2012 12:42   Final    Colony Count >=100,000 COLONIES/ML   Final    Culture PROTEUS MIRABILIS   Final    Report Status 08/16/2012 FINAL   Final    Organism ID, Bacteria PROTEUS MIRABILIS   Final      Studies: No results found.  Scheduled Meds:   . amLODipine  2.5 mg Oral Daily  . brimonidine  1 drop Both Eyes QHS  . ceFEPime (MAXIPIME) IV  1 g Intravenous Q12H  . dorzolamide-timolol  1 drop Both Eyes BID  . enoxaparin (LOVENOX) injection  40 mg Subcutaneous Q24H  . insulin aspart  0-15 Units Subcutaneous TID WC  . latanoprost  1 drop Left Eye QHS  . montelukast  10 mg Oral QHS  . oxybutynin  5 mg Oral Daily  . QUEtiapine  25 mg Oral BID  . senna-docusate  1 tablet Oral BID  . simvastatin  5 mg Oral q1800  . sodium chloride  500 mL Intravenous Once  . sodium chloride  3 mL Intravenous Q12H  . [DISCONTINUED] albuterol  2.5 mg Nebulization Q6H  . [DISCONTINUED] QUEtiapine  25 mg Oral QHS   Continuous Infusions:   . sodium chloride 100 mL/hr at 08/17/12 0410      Time spent: 25 minutes    [name] [name]  [facility] Pager [phone]. If 8PM-8AM, please contact night-coverage at [email or web address], password [facility] 08/17/2012, 12:46 PM  LOS: 3 days

## 2012-08-18 ENCOUNTER — Inpatient Hospital Stay (HOSPITAL_COMMUNITY): Payer: PRIVATE HEALTH INSURANCE

## 2012-08-18 LAB — GLUCOSE, CAPILLARY
Glucose-Capillary: 76 mg/dL (ref 70–99)
Glucose-Capillary: 80 mg/dL (ref 70–99)
Glucose-Capillary: 88 mg/dL (ref 70–99)
Glucose-Capillary: 75 mg/dL (ref 70–99)

## 2012-08-18 MED ORDER — LORAZEPAM 2 MG/ML IJ SOLN
1.0000 mg | Freq: Once | INTRAMUSCULAR | Status: AC
  Administered 2012-08-18: 1 mg via INTRAVENOUS
  Filled 2012-08-18: qty 1

## 2012-08-18 MED ORDER — RISPERIDONE 1 MG PO TABS
1.0000 mg | ORAL_TABLET | Freq: Two times a day (BID) | ORAL | Status: DC
  Administered 2012-08-19 – 2012-08-20 (×3): 1 mg via ORAL
  Filled 2012-08-18 (×7): qty 1

## 2012-08-18 MED ORDER — RIVASTIGMINE 13.3 MG/24HR TD PT24: 1.0000 | MEDICATED_PATCH | Freq: Every day | TRANSDERMAL | Status: DC

## 2012-08-18 MED ORDER — RIVASTIGMINE 4.6 MG/24HR TD PT24: 13.3000 mg | MEDICATED_PATCH | Freq: Every day | TRANSDERMAL | Status: DC

## 2012-08-18 MED ORDER — RIVASTIGMINE 13.3 MG/24HR TD PT24
1.0000 | MEDICATED_PATCH | Freq: Every day | TRANSDERMAL | Status: DC
  Administered 2012-08-18 – 2012-08-26 (×9): 13.3 mg via TRANSDERMAL

## 2012-08-18 NOTE — Care Management Note (Signed)
    Page 1 of 1   08/26/2012     5:00:07 PM   CARE MANAGEMENT NOTE 08/26/2012  Patient:  [name] [name], [name] [name]   Account Number:  [id number]  Date Initiated:  08/18/2012  Documentation initiated by:  Sheppard Luckenbach  Subjective/Objective Assessment:   PT ADM WITH AMS, UTI ON 08/14/12.  PTA, PT RESIDES AT [facility].     Action/Plan:   CSW CONSULTED TO FACILITATE RETURN TO GROUP HOME WHEN MEDICALLY STABLE.  WILL FOLLOW.   Anticipated DC Date:  08/26/2012   Anticipated DC Plan:  SKILLED NURSING FACILITY  In-house referral  Clinical Social Worker      DC Planning Services  CM consult      Choice offered to / List presented to:             Status of service:  Completed, signed off Medicare Important Message given?   (If response is "NO", the following Medicare IM given date fields will be blank) Date Medicare IM given:   Date Additional Medicare IM given:    Discharge Disposition:  SKILLED NURSING FACILITY  Per UR Regulation:  Reviewed for med. necessity/level of care/duration of stay  If discussed at Long Length of Stay Meetings, dates discussed:    Comments:  08/26/12 Ly Wass,RN,BSN [phone] PT DISCHARGING TO SNF TODAY, PER CSW ARRANGEMENTS.  Rosalie Buenaventura,RN,BSN [phone] PT RESIDED AT ALF PRIOR TO ADMISSION, AND WILL NOT BE ABLE TO RETURN THERE IN CURRENT STATE.  REMAINS RESTRAINED AT INTERVALS WITH SAFETY SITTER.  WILL FOLLOW PROGRESS.  WILL LIKELY NEED SNF ONCE MENTATION IMPROVES.

## 2012-08-18 NOTE — Progress Notes (Signed)
Pt is confused and agitated. He refused all 22:00 po medications and refused his 22:00 doses of his eye drops. Pt is lying to the right side of the bed with his head and arm against the rail. Attempted to reposition pt but he refused to lie in the center of the bed and returned to the position of lying against the right side rails. Pt asks [place] to leave him alone whenever we try to move him. Administering prn dose of IV ativan for relief of pt's agitation.

## 2012-08-18 NOTE — Progress Notes (Addendum)
Pt getting progressively anxious, and saying he needs to leave. Pt could not be consoled or calmed down.  Gave 1mg  ativan and will continue to monitor.

## 2012-08-18 NOTE — Progress Notes (Signed)
[facility] PROGRESS NOTE  [name] [name] YQM:[id number] DOB: 11-19-1930 DOA: 08/14/2012 PCP: Syliva Overman, MD  Assessment/Plan: Altered mental status  initially thought secondary to urinary tract infection, however has persistent delirium with underlying dementia . he has been started on cefepime ( day 5). CT head was negative.  -was admitted at [facility] 3 weeks back for dementia with delusional symptoms. EEG done there was negative for seizure activity.  Patient  Continues with agitation this am. Continuing to  avoid narcotics but am needing to use some prn benzos when haldol not effective. Added Seroquel bid and qhs . Monitor QTc  - family mentioned patient to have moderate dementia but no agitation or delirium at baseline. He was on exelon patch which can cause delirium, agitation and hallucinations in over 5 % cases. discontinued his exelon patch.  -productive cough this am with audible expiratory wheeze. Will check chest xray and rectal temp. On rocephin. VSS. Monitor closely UTI  Cx growing proteus. Mostly sensitive. Will switch to rocephin.( day 5 of abx)  Dementia:  D/c Exelon patch on 08/17/12, Xanax was discontinued on admission. Continue with scheduled Seroquel tid. patient still agitated when waken up for vitals, cleaning up or meals. Will continue prn haldol for now.  Diabetes mellitus  holding metformin  Continue sliding scale insulin  Controlled. Continue to monitor.  Chronic resp failure Hypoxia on admission. Improved. This am somewhat tachypnic with productive cough and audible wheeze. Will check rectal temp, chest xray and O2 at 2L. Sats 92-100.  HTN Fair control. Continue home norvasc. monitor  Code Status: full Family Communication: Daughter at bedside Disposition Plan: home to assisted living when medically stable.    Consultants:  none  Procedures:  None   Antibiotics:  Rocephin 08/14/12 >>>>  HPI/Subjective: Lying in bed with eyes  closed. Responds to light touch with some rambling of requests i.e. "need some water, need have BM". Does not follow commands. Easily agitated during exam  Objective: Filed Vitals:   08/16/12 1933 08/17/12 0537 08/17/12 1504 08/18/12 0500  BP: 160/85 146/85 135/70 146/84  Pulse: 108 84 78 68  Temp:   98.4 F (36.9 C)   TempSrc:   Oral   Resp: 16 18 14 18   Height:      Weight:      SpO2: 94% 92% 100% 95%    Intake/Output Summary (Last 24 hours) at 08/18/12 1054 Last data filed at 08/18/12 0644  Gross per 24 hour  Intake   1200 ml  Output    900 ml  Net    300 ml   Filed Weights   08/16/12 0850  Weight: 78.881 kg (173 lb 14.4 oz)    Exam:   General:  Easily agitated, uncooperative. Oriented to self only  Cardiovascular: RRR no MGR No LEE  Respiratory: mild increased work of breathing with use of abdominal accessory muscles. Audible expiratory wheeze. BS coarse bilaterally. Productive cough  Abdomen: soft +BS non-tender to palpation  Skin: very warm to touch. Dry. No rash. MM mouth dry  Data Reviewed: Basic Metabolic Panel:  Lab 08/16/12 5284 08/14/12 1802 08/14/12 0945 08/13/12 1821  NA 139 -- 138 136  K 4.5 -- 4.7 4.4  CL 103 -- 98 98  CO2 28 -- 32 31  GLUCOSE 101* -- 110* 106*  BUN 14 -- 14 12  CREATININE 1.10 1.12 1.22 1.07  CALCIUM 9.7 -- 10.6* 10.6*  MG -- -- -- --  PHOS -- -- -- --  Liver Function Tests:  Lab 08/14/12 1802 08/14/12 0945  AST 18 --  ALT 14 --  ALKPHOS 69 --  BILITOT 0.3 --  PROT 7.4 --  ALBUMIN 3.6 3.7   No results found for this basename: LIPASE:5,AMYLASE:5 in the last 168 hours No results found for this basename: AMMONIA:5 in the last 168 hours CBC:  Lab 08/16/12 0440 08/15/12 1442 08/14/12 1802 08/14/12 0945 08/13/12 1821  WBC 8.5 10.3 9.5 8.6 8.5  NEUTROABS -- -- -- 6.1 5.3  HGB 9.9* 10.6* 10.9* 11.4* 11.2*  HCT 33.0* 35.2* 36.5* 38.5* 37.3*  MCV 88.7 87.6 89.5 88.9 89.7  PLT 258 278 286 296 288   Cardiac  Enzymes:  Lab 08/15/12 1441 08/14/12 2355 08/14/12 1754  CKTOTAL -- -- --  CKMB -- -- --  CKMBINDEX -- -- --  TROPONINI <0.30 <0.30 <0.30   BNP (last 3 results) No results found for this basename: PROBNP:3 in the last 8760 hours CBG:  Lab 08/18/12 0627 08/17/12 2101 08/17/12 1630 08/17/12 1113 08/17/12 0828  GLUCAP 73 76 75 134* 87    Recent Results (from the past 240 hour(s))  URINE CULTURE     Status: Normal   Collection Time   08/13/12  8:49 PM      Component Value Range Status Comment   Specimen Description URINE, CATHETERIZED   Final    Special Requests NONE   Final    Culture  Setup Time 08/13/2012 20:45   Final    Colony Count 25,000 COLONIES/ML   Final    Culture PROTEUS MIRABILIS   Final    Report Status 08/17/2012 FINAL   Final    Organism ID, Bacteria PROTEUS MIRABILIS   Final   URINE CULTURE     Status: Normal   Collection Time   08/14/12 11:08 AM      Component Value Range Status Comment   Specimen Description URINE, CLEAN CATCH   Final    Special Requests NONE   Final    Culture  Setup Time 08/14/2012 12:42   Final    Colony Count >=100,000 COLONIES/ML   Final    Culture PROTEUS MIRABILIS   Final    Report Status 08/16/2012 FINAL   Final    Organism ID, Bacteria PROTEUS MIRABILIS   Final      Studies: No results found.  Scheduled Meds:   . amLODipine  2.5 mg Oral Daily  . brimonidine  1 drop Both Eyes QHS  . cefTRIAXone (ROCEPHIN)  IV  1 g Intravenous Q24H  . dorzolamide-timolol  1 drop Both Eyes BID  . enoxaparin (LOVENOX) injection  40 mg Subcutaneous Q24H  . insulin aspart  0-15 Units Subcutaneous TID WC  . latanoprost  1 drop Left Eye QHS  . [COMPLETED] LORazepam  1 mg Intravenous Once  . montelukast  10 mg Oral QHS  . oxybutynin  5 mg Oral Daily  . QUEtiapine  25 mg Oral BID  . senna-docusate  1 tablet Oral BID  . simvastatin  5 mg Oral q1800  . sodium chloride  500 mL Intravenous Once  . sodium chloride  3 mL Intravenous Q12H  .  [DISCONTINUED] albuterol  2.5 mg Nebulization Q6H  . [DISCONTINUED] ceFEPime (MAXIPIME) IV  1 g Intravenous Q12H   Continuous Infusions:   . sodium chloride 100 mL/hr at 08/18/12 2841    Principal Problem:  *Altered mental status Active Problems:  HYPERLIPIDEMIA  Presenile dementia with delusional features  GLAUCOMA  HYPERTENSION  GERD  INSOMNIA  Senile dementia with delusional or depressive features    Time spent: 30 minutes    [facility] M  [facility]  If 8PM-8AM, please contact night-coverage at [email or web address], password [facility] 08/18/2012, 10:54 AM  LOS: 4 days

## 2012-08-18 NOTE — Progress Notes (Signed)
Pt agitated trying to get OOB; pt given 1mg  IV Ativan at this time; will cont. To monitor.

## 2012-08-18 NOTE — Progress Notes (Signed)
Patient seen and examined . Still agitated and very delirious. Family at bedside inform  that patient was stable on high dose of  exelon ( 16 mg daily) and was reduced when he became altered and less oriented at [employer].  i will resume his exelon patch at 13.3 mg daily. Will also d/c Seroquel and switch to Risperdal 1 mg bid ( was on 0.5 mg qhs at home)

## 2012-08-19 DIAGNOSIS — J9610 Chronic respiratory failure, unspecified whether with hypoxia or hypercapnia: Secondary | ICD-10-CM

## 2012-08-19 LAB — GLUCOSE, CAPILLARY
Glucose-Capillary: 76 mg/dL (ref 70–99)
Glucose-Capillary: 126 mg/dL — ABNORMAL HIGH (ref 70–99)
Glucose-Capillary: 111 mg/dL — ABNORMAL HIGH (ref 70–99)

## 2012-08-19 LAB — BASIC METABOLIC PANEL
CO2: 25 mEq/L (ref 19–32)
Calcium: 10.1 mg/dL (ref 8.4–10.5)
Creatinine, Ser: 0.97 mg/dL (ref 0.50–1.35)
GFR calc Af Amer: 89 mL/min — ABNORMAL LOW (ref >90)
GFR calc non Af Amer: 76 mL/min — ABNORMAL LOW (ref >90)
Sodium: 142 mEq/L (ref 135–145)

## 2012-08-19 LAB — CBC
MCH: 26.8 pg (ref 26.0–34.0)
MCV: 92.4 fL (ref 78.0–100.0)
Platelets: 285 10*3/uL (ref 150–400)
RBC: 3.84 MIL/uL — ABNORMAL LOW (ref 4.22–5.81)
RDW: 14.9 % (ref 11.5–15.5)

## 2012-08-19 LAB — MAGNESIUM: Magnesium: 2 mg/dL (ref 1.5–2.5)

## 2012-08-19 MED ORDER — LEVALBUTEROL HCL 0.63 MG/3ML IN NEBU
0.6300 mg | INHALATION_SOLUTION | RESPIRATORY_TRACT | Status: DC | PRN
  Filled 2012-08-19: qty 3

## 2012-08-19 MED ORDER — ALBUTEROL SULFATE (5 MG/ML) 0.5% IN NEBU
2.5000 mg | INHALATION_SOLUTION | RESPIRATORY_TRACT | Status: DC
  Filled 2012-08-19: qty 0.5

## 2012-08-19 MED ORDER — AMLODIPINE BESYLATE 5 MG PO TABS
5.0000 mg | ORAL_TABLET | Freq: Every day | ORAL | Status: DC
  Administered 2012-08-20 – 2012-08-26 (×5): 5 mg via ORAL
  Filled 2012-08-19 (×7): qty 1

## 2012-08-19 MED ORDER — LEVALBUTEROL HCL 0.63 MG/3ML IN NEBU
0.6300 mg | INHALATION_SOLUTION | Freq: Four times a day (QID) | RESPIRATORY_TRACT | Status: DC
  Administered 2012-08-19 – 2012-08-26 (×25): 0.63 mg via RESPIRATORY_TRACT
  Filled 2012-08-19 (×34): qty 3

## 2012-08-19 MED ORDER — ALBUTEROL SULFATE (5 MG/ML) 0.5% IN NEBU: 2.5000 mg | INHALATION_SOLUTION | RESPIRATORY_TRACT | Status: DC | PRN

## 2012-08-19 NOTE — Evaluation (Signed)
Physical Therapy Evaluation Patient Details Name: [name] [name] MRN: [id number] DOB: 07/15/1931 Today's Date: 08/19/2012 Time: 4696-2952 PT Time Calculation (min): 30 min  PT Assessment / Plan / Recommendation Clinical Impression  Pt. s/p AMS with decr mobility secondary to impaired cognition and poor endurance at present.  Will benefit from SNF level of care with therapy to return to prior level of function with eventual return to A living as PTA.      PT Assessment  Patient needs continued PT services    Follow Up Recommendations  SNF;Supervision/Assistance - 24 hour    Does the patient have the potential to tolerate intense rehabilitation      Barriers to Discharge Decreased caregiver support      Equipment Recommendations       Recommendations for Other Services     Frequency Min 3X/week    Precautions / Restrictions Precautions Precautions: Fall;Other (comment) (DNR) Restrictions Weight Bearing Restrictions: No   Pertinent Vitals/Pain VSS, No pain      Mobility  Bed Mobility Bed Mobility: Rolling Right;Right Sidelying to Sit;Sitting - Scoot to [place] of Bed Rolling Right: 3: Mod assist;With rail Right Sidelying to Sit: 3: Mod assist;With rails;HOB elevated Sitting - Scoot to Edge of Bed: 4: Min assist Details for Bed Mobility Assistance: cues for technique and assist to elevate trunk Transfers Transfers: Sit to Stand;Stand to Sit Sit to Stand: 1: +2 Total assist;With upper extremity assist;From bed Sit to Stand: Patient Percentage: 60% Stand to Sit: 1: +2 Total assist;With upper extremity assist;With armrests;To chair/3-in-1 Stand to Sit: Patient Percentage: 60% Details for Transfer Assistance: cues for hand placement.  Weight posterior on heels initially.  Took incr time for patient to get balance as well as +2 assist and cues. Ambulation/Gait Ambulation/Gait Assistance: 1: +2 Total assist Ambulation/Gait: Patient Percentage: 70% Ambulation Distance  (Feet): 85 Feet Assistive device: 2 person hand held assist Ambulation/Gait Assistance Details: Pt ambulated needing cues for technique and sequencing steps.  Pt with unsteady gait staggering multiple times.  Needed guidance and constant cues to continue ambulating.   Gait Pattern: Step-to pattern;Decreased stride length;Shuffle;Trunk flexed;Narrow base of support;Left steppage;Right steppage Stairs: No Wheelchair Mobility Wheelchair Mobility: No              PT Diagnosis: Generalized weakness  PT Problem List: Decreased activity tolerance;Decreased balance;Decreased mobility;Decreased safety awareness;Decreased knowledge of precautions PT Treatment Interventions: DME instruction;Gait training;Functional mobility training;Therapeutic activities;Therapeutic exercise;Balance training;Patient/family education   PT Goals Acute Rehab PT Goals PT Goal Formulation: With patient Time For Goal Achievement: 09/02/12 Potential to Achieve Goals: Good [name] Supine/Side to Sit: Independently PT Goal: Supine/Side to Sit - Progress: Goal set today [name] Sit to Stand: Independently PT Goal: Sit to Stand - Progress: Goal set today Pt will Ambulate: >150 feet;with modified independence;with least restrictive assistive device PT Goal: Ambulate - Progress: Goal set today  Visit Information  Last PT Received On: 08/19/12 Assistance Needed: +2    Subjective Data  Subjective: "I want to walk." Patient Stated Goal: To go back to A living   Prior Functioning  Home Living Lives With: Alone Available Help at Discharge: Available 24 hours/day Type of Home: Assisted living Home Access: Ramped entrance Home Layout: One level Bathroom Shower/Tub: Health visitor: Handicapped height Home Adaptive Equipment: Walker - rolling;Grab bars around toilet;Grab bars in shower;Hospital bed;Shower chair with back (2 L O2) Prior Function Level of Independence: Needs assistance Needs  Assistance: Bathing;Dressing Bath: Moderate Dressing: Moderate Feeding:  (  Mod I after set-up) Able to Take Stairs?: No Driving: No Vocation: Retired Musician: HOH;Other (comment) (Hearing aid batteries dead, dtr to bring new) Dominant Hand: Right    Cognition  Overall Cognitive Status: History of cognitive impairments - at baseline Arousal/Alertness: Awake/alert Orientation Level: Disoriented to;Place;Time;Situation Behavior During Session: [facility] for tasks performed    Extremity/Trunk Assessment Right Lower Extremity Assessment RLE ROM/Strength/Tone: [facility] for tasks assessed Left Lower Extremity Assessment LLE ROM/Strength/Tone: [facility] for tasks assessed   Balance Dynamic Standing Balance Dynamic Standing - Balance Support: Bilateral upper extremity supported;During functional activity Dynamic Standing - Level of Assistance: 4: Min assist Dynamic Standing - Balance Activities: Lateral lean/weight shifting;Reaching across midline  End of Session PT - End of Session Equipment Utilized During Treatment: Gait belt;Oxygen Activity Tolerance: Patient limited by fatigue Patient left: in chair;with call bell/phone within reach;with chair alarm set;with family/visitor present;with nursing in room Nurse Communication: Mobility status      INGOLD,[name] [name] 08/19/2012, 11:52 AM  [name] [phone] (pager)

## 2012-08-19 NOTE — Evaluation (Signed)
Occupational Therapy Evaluation Patient Details Name: Windsor ANTOLIN MRN: [id number] DOB: 1931-06-05 Today's Date: 08/19/2012 Time: 4098-1191 OT Time Calculation (min): 19 min  OT Assessment / Plan / Recommendation Clinical Impression  Pt is a 78 y/o male w/ dx/ AMS, pt lives in ALF. Pt's daughter present throughout assessment today. According to dtr, pt required extensive assist for bathing and dressing at ALF and she anticipates that this will not change. He is not currently feeding himself since recent AMS and acute OT will address this to maximize I for anticipated return to ALF at [facility]. Recommend nursing staff assist pt with ADL's related to bathing, dressing and selfcare.    OT Assessment  Patient needs continued OT Services    Follow Up Recommendations  No OT follow up;Supervision/Assistance - 24 hour    Barriers to Discharge      Equipment Recommendations  None recommended by OT    Recommendations for Other Services    Frequency  Min 2X/week    Precautions / Restrictions Precautions Precautions: Fall;Other (comment) (DNR) Restrictions Weight Bearing Restrictions: No   Pertinent Vitals/Pain Pt w/o c/o pain    ADL  Eating/Feeding: Simulated;Maximal assistance;Other (comment) (Dtr reports pt was I self feeding PTA) Where Assessed - Eating/Feeding: Chair Grooming: Simulated;Wash/dry face;Wash/dry hands;Moderate assistance Where Assessed - Grooming: Supported sitting Upper Body Bathing: Simulated;Moderate assistance Where Assessed - Upper Body Bathing: Supported sitting Lower Body Bathing: Simulated;Moderate assistance Where Assessed - Lower Body Bathing: Supine, head of bed up;Supported sitting Upper Body Dressing: Simulated;Moderate assistance Where Assessed - Upper Body Dressing: Supported sitting Lower Body Dressing: Simulated;Maximal assistance Where Assessed - Lower Body Dressing: Supported sitting;Supine, head of bed up Toilet Transfer: Other (comment) (pt  w/ catheter, currently +2 A ambulation) Tub/Shower Transfer Method: Not assessed ADL Comments: Pt is a 78 y/o male w/ dx/ AMS, pt lives in ALF. Pt's daughter present throughout assessment today. According to dtr, pt required extensive assist for bathing and dressing at ALF and she anticipates that this will not change. He is not currently feeding himself since recent AMS and acute OT will address this to maximize I for anticipated return to ALF at [facility]. Recommend nursing staff assist pt with ADL's related to bathing, dressing and selfcare.    OT Diagnosis: Altered mental status  OT Problem List: Decreased activity tolerance;Decreased safety awareness;Decreased knowledge of use of DME or AE;Decreased cognition;Other (comment) (AMS) OT Treatment Interventions: Self-care/ADL training;DME and/or AE instruction;Therapeutic activities;Patient/family education   OT Goals Acute Rehab OT Goals OT Goal Formulation: With family Time For Goal Achievement: 08/26/12 Potential to Achieve Goals: Good ADL Goals Pt Will Perform Eating: with set-up;with modified independence;Sitting, edge of bed;Sitting, chair;Supine, head of bed up ADL Goal: Eating - Progress: Goal set today Pt Will Perform Grooming: with modified independence;with set-up;Sitting, edge of bed;Supine, head of bed up;Sitting, chair;Other (comment) (Grooming for wash/dry face/hands in prep self feeding) ADL Goal: Grooming - Progress: Goal set today  Visit Information  Last OT Received On: 08/19/12 Assistance Needed: +2    Subjective Data  Subjective: Pt's daughter states pt from ALF  Patient Stated Goal: Return to ALF when medically able, per pt's daughter   Prior Functioning     Home Living Lives With: Alone Available Help at Discharge: Available 24 hours/day Type of Home: Assisted living Home Access: Ramped entrance Home Layout: One level Bathroom Shower/Tub: [employer] manager Toilet: Handicapped height Home Adaptive  Equipment: Walker - rolling;Grab bars around toilet;Grab bars in shower;Hospital bed;Shower chair with back (2 L  O2) Prior Function Level of Independence: Needs assistance Needs Assistance: Bathing;Dressing Bath: Moderate Dressing: Moderate Feeding:  (Mod I after set-up) Able to Take Stairs?: No Driving: No Vocation: Retired Musician: HOH;Other (comment) (Hearing aid batteries dead, dtr to bring new) Dominant Hand: Right    Vision/Perception  Wears glasses   Cognition  Overall Cognitive Status: History of cognitive impairments - at baseline Arousal/Alertness: Awake/alert Orientation Level: Disoriented to;Place;Time;Situation Behavior During Session: [facility] for tasks performed    Extremity/Trunk Assessment Right Upper Extremity Assessment RUE ROM/Strength/Tone: Within functional levels RUE Coordination: WFL - gross/fine motor Left Upper Extremity Assessment LUE ROM/Strength/Tone: Within functional levels LUE Coordination: WFL - gross/fine motor Right Lower Extremity Assessment RLE ROM/Strength/Tone: WFL for tasks assessed Left Lower Extremity Assessment LLE ROM/Strength/Tone: WFL for tasks assessed     Mobility Bed Mobility Bed Mobility: Not assessed (Pt up in chair) Rolling Right: 3: Mod assist;With rail Right Sidelying to Sit: 3: Mod assist;With rails;HOB elevated Sitting - Scoot to Edge of Bed: 4: Min assist Details for Bed Mobility Assistance: cues for technique and assist to elevate trunk Transfers Transfers: Sit to Stand;Stand to Sit Sit to Stand: 1: +2 Total assist;With upper extremity assist;From chair/3-in-1 Sit to Stand: Patient Percentage: 60% Stand to Sit: 1: +2 Total assist;With upper extremity assist;To chair/3-in-1 Stand to Sit: Patient Percentage: 60% Details for Transfer Assistance: Verbal/Tactile cues for safety and sequencing           Balance Dynamic Standing Balance Dynamic Standing - Balance Support: Bilateral upper extremity  supported;During functional activity Dynamic Standing - Level of Assistance: 4: Min assist Dynamic Standing - Balance Activities: Lateral lean/weight shifting;Reaching across midline   End of Session OT - End of Session Activity Tolerance: Patient tolerated treatment well;Other (comment) (Limited secondary HOH, family to bring batteries hearing aid) Patient left: in chair;with call bell/phone within reach;with nursing in room;with family/visitor present  GO     Roselie Awkward Dixon 08/19/2012, 12:01 PM

## 2012-08-19 NOTE — Progress Notes (Signed)
Patient awake and sitting in chair. Patient able to feed himself. States "I can see a little better now, If only I had my hearing aides." Second hearing aide found. Encouraged family to bring other hearing aide and battery. Will continue to monitor. Family and Call bell near. Mamie Levers

## 2012-08-19 NOTE — Progress Notes (Signed)
Pt is refusing to eat or drink. CBG this morning is 70. Will continue to monitor CBG.

## 2012-08-19 NOTE — Progress Notes (Signed)
[facility] PROGRESS NOTE  [name] [name] MVH:[id number] DOB: 12-30-1930 DOA: 08/14/2012 PCP: Syliva Overman, MD  Assessment/Plan: Altered mental status  Likely multifactorial i.e. Infectious encephalopathy and or toxic encephalopathy from medication changes. Much improved this am after restarting exelon yesterday and 5 days of antibiotics for UTI. Making needs known, questioning why in hospital and why restrained. CT head was negative. Admitted at [facility] 3 weeks back for dementia with delusional symptoms. EEG done there was negative for seizure activity.   -Will attempt to get up in chair and have PT/Ot eval - continues with productive cough and audible expiratory wheeze. Chest xray negative and afebrile. VSS. Monitor closely  UTI  Cx growing proteus. Mostly sensitive. Rocephin.( day 6 of abx)  Dementia:  D/c Exelon patch on 08/17/12, and restarted 08/18/12. Xanax was discontinued on admission. Discontinue scheduled Seroquel. 08/18/12. Remains confused about routine i.e. States he needs to get up and put his clothes on because that is what he does at home.   Diabetes mellitus  holding metformin  Continue sliding scale insulin  Controlled. Continue to monitor.  Chronic resp failure  Hypoxia on admission. Improved. Continues with productive cough and wheezes. Chest xray neg and O2 at 2L. Sats 92-100.  HTN  Fair control. Continue home norvasc. monitor   Code Status: DNR Family Communication: No family at bedside Disposition Plan: Came from assisted living   Consultants:  None  Procedures:  none  Antibiotics:  Rocephin  HPI/Subjective: Awake alert oriented to self only. Denies pain but states, "need some food and help going to bathroom". Also requesting mouth care. Very HOH and not wearing his bilateral hearing aids  Objective: Filed Vitals:   08/18/12 1108 08/18/12 1401 08/18/12 2007 08/19/12 0605  BP:  119/63 168/97 148/67  Pulse:  88 119 107  Temp: 99.4 F  (37.4 C) 97.7 F (36.5 C) 98.4 F (36.9 C) 97.9 F (36.6 C)  TempSrc: Rectal Axillary Oral Axillary  Resp:  18 18 19   Height:      Weight:      SpO2:  100% 96% 96%    Intake/Output Summary (Last 24 hours) at 08/19/12 0730 Last data filed at 08/19/12 8413  Gross per 24 hour  Intake    520 ml  Output   1750 ml  Net  -1230 ml   Filed Weights   08/16/12 0850  Weight: 78.881 kg (173 lb 14.4 oz)    Exam:   General:  Awake alert NAD  Cardiovascular: RRR No MGR No LEE PPP  Respiratory: normal effort BS with diffuse wheeze/rhonchi. Productive cough with thick pale sputum  Abdomen: round, soft +BS non-tender to palpation  Data Reviewed: Basic Metabolic Panel:  Lab 08/19/12 2440 08/16/12 0440 08/14/12 1802 08/14/12 0945 08/13/12 1821  NA 142 139 -- 138 136  K 4.2 4.5 -- 4.7 4.4  CL 102 103 -- 98 98  CO2 25 28 -- 32 31  GLUCOSE 66* 101* -- 110* 106*  BUN 11 14 -- 14 12  CREATININE 0.97 1.10 1.12 1.22 1.07  CALCIUM 10.1 9.7 -- 10.6* 10.6*  MG 2.0 -- -- -- --  PHOS -- -- -- -- --   Liver Function Tests:  Lab 08/14/12 1802 08/14/12 0945  AST 18 --  ALT 14 --  ALKPHOS 69 --  BILITOT 0.3 --  PROT 7.4 --  ALBUMIN 3.6 3.7   No results found for this basename: LIPASE:5,AMYLASE:5 in the last 168 hours No results found for this basename: AMMONIA:5  in the last 168 hours CBC:  Lab 08/19/12 0430 08/16/12 0440 08/15/12 1442 08/14/12 1802 08/14/12 0945 08/13/12 1821  WBC 10.7* 8.5 10.3 9.5 8.6 --  NEUTROABS -- -- -- -- 6.1 5.3  HGB 10.3* 9.9* 10.6* 10.9* 11.4* --  HCT 35.5* 33.0* 35.2* 36.5* 38.5* --  MCV 92.4 88.7 87.6 89.5 88.9 --  PLT 285 258 278 286 296 --   Cardiac Enzymes:  Lab 08/15/12 1441 08/14/12 2355 08/14/12 1754  CKTOTAL -- -- --  CKMB -- -- --  CKMBINDEX -- -- --  TROPONINI <0.30 <0.30 <0.30   BNP (last 3 results) No results found for this basename: PROBNP:3 in the last 8760 hours CBG:  Lab 08/19/12 0601 08/18/12 2018 08/18/12 1634 08/18/12 1122  08/18/12 0627  GLUCAP 70 75 88 80 73    Recent Results (from the past 240 hour(s))  URINE CULTURE     Status: Normal   Collection Time   08/13/12  8:49 PM      Component Value Range Status Comment   Specimen Description URINE, CATHETERIZED   Final    Special Requests NONE   Final    Culture  Setup Time 08/13/2012 20:45   Final    Colony Count 25,000 COLONIES/ML   Final    Culture PROTEUS MIRABILIS   Final    Report Status 08/17/2012 FINAL   Final    Organism ID, Bacteria PROTEUS MIRABILIS   Final   URINE CULTURE     Status: Normal   Collection Time   08/14/12 11:08 AM      Component Value Range Status Comment   Specimen Description URINE, CLEAN CATCH   Final    Special Requests NONE   Final    Culture  Setup Time 08/14/2012 12:42   Final    Colony Count >=100,000 COLONIES/ML   Final    Culture PROTEUS MIRABILIS   Final    Report Status 08/16/2012 FINAL   Final    Organism ID, Bacteria PROTEUS MIRABILIS   Final      Studies: Dg Chest Port 1 View  08/18/2012  *RADIOLOGY REPORT*  Clinical Data: Wheezing.  Productive cough.  Shortness of breath.  PORTABLE CHEST - 1 VIEW  Comparison: 08/14/2012  Findings: There are numerous bilateral pleural plaques which appear unchanged since the CT scan dated 04/16/2012.  There are no acute infiltrates or effusions.  Heart size and vascularity are normal.  No acute osseous abnormality.  IMPRESSION: No acute disease.  Multiple nodular pleural plaques.   Original Report Authenticated By: Francene Boyers, M.D.     Scheduled Meds:   . amLODipine  2.5 mg Oral Daily  . brimonidine  1 drop Both Eyes QHS  . cefTRIAXone (ROCEPHIN)  IV  1 g Intravenous Q24H  . dorzolamide-timolol  1 drop Both Eyes BID  . enoxaparin (LOVENOX) injection  40 mg Subcutaneous Q24H  . insulin aspart  0-15 Units Subcutaneous TID WC  . latanoprost  1 drop Left Eye QHS  . montelukast  10 mg Oral QHS  . oxybutynin  5 mg Oral Daily  . risperiDONE  1 mg Oral BID  . Rivastigmine  1  patch Transdermal Daily  . senna-docusate  1 tablet Oral BID  . simvastatin  5 mg Oral q1800  . sodium chloride  500 mL Intravenous Once  . sodium chloride  3 mL Intravenous Q12H  . [DISCONTINUED] QUEtiapine  25 mg Oral BID  . [DISCONTINUED] rivastigmine  13.8 mg Transdermal Daily  . [  DISCONTINUED] Rivastigmine  1 patch Transdermal Daily   Continuous Infusions:   . 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

## 2012-08-19 NOTE — Progress Notes (Signed)
Inpatient Diabetes Program Recommendations  AACE/ADA: New Consensus Statement on Inpatient Glycemic Control (2013)  Target Ranges:  Prepandial:   less than 140 mg/dL      Peak postprandial:   less than 180 mg/dL (1-2 hours)      Critically ill patients:  140 - 180 mg/dL  Results for BRYANT, SAYE (MRN [id number]) as of 08/19/2012 15:19  Ref. Range 08/18/2012 11:22 08/18/2012 16:34 08/18/2012 20:18 08/19/2012 06:01 08/19/2012 11:31  Glucose-Capillary Latest Range: 70-99 mg/dL 80 88 75 70 76   Inpatient Diabetes Program Recommendations Correction (SSI): Discontinue Novolog Thank you  Piedad Climes [facility] Inpatient Diabetes Coordinator [phone]

## 2012-08-19 NOTE — Clinical Documentation Improvement (Signed)
CHANGE MENTAL STATUS DOCUMENTATION CLARIFICATION   THIS DOCUMENT IS NOT A PERMANENT PART OF THE MEDICAL RECORD  TO RESPOND TO THE THIS QUERY, FOLLOW THE INSTRUCTIONS BELOW:  1. If needed, update documentation for the patient's encounter via the notes activity.  2. Access this query again and click edit on the In [employer].  3. After updating, or not, click F2 to complete all highlighted (required) fields concerning your review. Select "additional documentation in the medical record" OR "no additional documentation provided".  4. Click Sign note button.  5. The deficiency will fall out of your In Basket *Please let us know if you are not able to complete this workflow by phone or e-mail (listed below).         08/19/12  Dear  Toya Smothers, NP   Marton Redwood  In an effort to better capture your patient's severity of illness/SOI, risk of mortality/ROM, reflect appropriate length of stay and utilization of resources, a review of the patient medical record has revealed the following indicators.    In responding to this query please exercise your independent judgment.  The fact that a query is asked, does not imply that any particular answer is desired or expected. NOTED IN RECORD PRIMARY DIAGNOSIS LISTED MULTIPLE TIMES AS "ALTERED MENTAL STATUS". THIS IS A SYMPTOM. PLEASE DOCUMENT IN NOTES/DC SUMMARY POSSIBLE/SUSPECTED UNDERLYING CONDITION LEADING TO "AMS" TO BETTER CLARIFY PATIENT'S SOI. THANK YOU.  Possible Clinical Conditions?  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

## 2012-08-19 NOTE — Progress Notes (Signed)
Patient a little more appropriate. Asking why he is in the hospital and in restraints. Explained to patient illness and hospitalization. Family at bedside. Patient very apologetic about recent behavior. Restraints removed and family at bedside; awaiting staff sitter. Call bell near.[name] [name]

## 2012-08-19 NOTE — Progress Notes (Signed)
Patient seen and examined. Assessment and plan and in and please note reviewed and agree with the plan. Patient daily p.m. and agitation has markedly improved after placing him back on high dose of Exelon patch that he was on at home. Symptoms are likely worsening off vision dementia with delirium. Will discontinue restraints in place him on [facility] for now. Patient noted to be wheezy and continue with scheduled nebs. No signs of pneumonia on chest x-ray.  PT eval and plan for disposition. We will treat with 7 days of antibiotics for UTI.

## 2012-08-20 ENCOUNTER — Ambulatory Visit: Payer: PRIVATE HEALTH INSURANCE | Admitting: Family Medicine

## 2012-08-20 DIAGNOSIS — N390 Urinary tract infection, site not specified: Secondary | ICD-10-CM | POA: Diagnosis present

## 2012-08-20 DIAGNOSIS — R404 Transient alteration of awareness: Secondary | ICD-10-CM

## 2012-08-20 DIAGNOSIS — E119 Type 2 diabetes mellitus without complications: Secondary | ICD-10-CM

## 2012-08-20 DIAGNOSIS — R410 Disorientation, unspecified: Secondary | ICD-10-CM | POA: Diagnosis present

## 2012-08-20 LAB — CBC
Hemoglobin: 10.2 g/dL — ABNORMAL LOW (ref 13.0–17.0)
MCH: 26.4 pg (ref 26.0–34.0)
Platelets: 233 10*3/uL (ref 150–400)
RBC: 3.86 MIL/uL — ABNORMAL LOW (ref 4.22–5.81)
WBC: 8.7 10*3/uL (ref 4.0–10.5)

## 2012-08-20 LAB — GLUCOSE, CAPILLARY
Glucose-Capillary: 90 mg/dL (ref 70–99)
Glucose-Capillary: 107 mg/dL — ABNORMAL HIGH (ref 70–99)

## 2012-08-20 MED ORDER — LORAZEPAM 2 MG/ML IJ SOLN
1.0000 mg | Freq: Once | INTRAMUSCULAR | Status: AC
  Administered 2012-08-20: 1 mg via INTRAVENOUS
  Filled 2012-08-20: qty 1

## 2012-08-20 MED ORDER — BENZTROPINE MESYLATE 0.5 MG PO TABS
0.5000 mg | ORAL_TABLET | Freq: Two times a day (BID) | ORAL | Status: DC
  Administered 2012-08-20 – 2012-08-23 (×4): 0.5 mg via ORAL
  Filled 2012-08-20 (×7): qty 1

## 2012-08-20 MED ORDER — OLANZAPINE 5 MG PO TBDP
5.0000 mg | ORAL_TABLET | Freq: Every day | ORAL | Status: DC
  Administered 2012-08-20 – 2012-08-21 (×2): 5 mg via ORAL
  Filled 2012-08-20 (×5): qty 1

## 2012-08-20 MED ORDER — HALOPERIDOL LACTATE 5 MG/ML IJ SOLN
2.0000 mg | Freq: Four times a day (QID) | INTRAMUSCULAR | Status: DC | PRN
  Administered 2012-08-20: 2 mg via INTRAVENOUS
  Filled 2012-08-20 (×2): qty 0.4

## 2012-08-20 MED ORDER — HYDROXYZINE HCL 25 MG PO TABS
25.0000 mg | ORAL_TABLET | Freq: Every day | ORAL | Status: DC
  Administered 2012-08-20 – 2012-08-25 (×6): 25 mg via ORAL
  Filled 2012-08-20 (×8): qty 1

## 2012-08-20 NOTE — Plan of Care (Signed)
Problem: Phase III Progression Outcomes Goal: Foley discontinued Outcome: Not Met (add Reason) Pt is wearing a condom cath

## 2012-08-20 NOTE — Progress Notes (Addendum)
Patient becoming agitated, restless, and attempting to pull out his IV , sitter present in room by bedside, anti anxiety medication given, MD notified, will continue to monitor.

## 2012-08-20 NOTE — Progress Notes (Signed)
Unable to obtain EKG at this time. Tried, and is unable to be still right now. Will pass along to night shift to try to obtain EKG.

## 2012-08-20 NOTE — Progress Notes (Signed)
Pt very agitated and trying to get out of bed. MD ordered to give haldol 2mg  IV. Gave 2mg  haldol IV and will continue to monitor. Once pt calms down, will attempt to obtain EKG.

## 2012-08-20 NOTE — Progress Notes (Addendum)
[facility] PROGRESS NOTE  [name] [name] OVF:[id number] DOB: [birth year] DOA: 08/14/2012 PCP: Syliva Overman, MD  Assessment/Plan:  Altered mental status/delirium   Likely multifactorial i.e. Infectious encephalopathy and or toxic encephalopathy from medication changes. CT head was negative. Admitted at [facility] 3 weeks back for dementia with delusional symptoms. EEG done there was negative for seizure activity.  Apparently was better on 12/10 but has worsened 12/11-agitated, confused and trying to get out of bed. Is on restraints.  Continue Exelon patch. DC Ativan. Check EKG when able for QTC. Continue Recruitment consultant.  Discussed with psychiatry, recommended starting Zyprexa Zydis 5 mg by mouth each bedtime, Cogentin 0.5 mg by mouth twice a day and discontinue Risperdal and when necessary haloperidol.   Requested formal psychiatric consultation for 12/12.   UTI   Cx growing proteus. Mostly sensitive. Complete total 7 days of antibiotics-initially cefepime then Rocephin. DC antibiotics which may be contributing to mental status changes too.  Dementia:   D/c Exelon patch on 08/17/12, and restarted 08/18/12. Xanax was discontinued on admission. Discontinue scheduled Seroquel. 08/18/12. Please see above.   Diabetes mellitus   holding metformin   Continue sliding scale insulin   Controlled. Continue to monitor.   Hypoglycemia on BMP on 12/10 but CBGs look okay.  Chronic resp failure   Hypoxia on admission. Improved. On and off wheezes. Chest xray neg and O2 at 2L. Sats 92-100.   HTN   Fair control. Continue home norvasc. monitor   Anemia  Stable  Code Status: DNR Family Communication:  discussed with 2 daughters, son and daughter-in-law at bedside.  Disposition Plan: BC to assisted living when stable.   Consultants:  None  Procedures:  none  Antibiotics:  Rocephin-discontinued on 12/11.  HPI/Subjective: Agitated.  Objective: Filed Vitals:    08/20/12 0500 08/20/12 0749 08/20/12 1309 08/20/12 1505  BP: 148/88  158/96   Pulse: 94  88   Temp: 98.1 F (36.7 C)  98.2 F (36.8 C)   TempSrc: Axillary  Oral   Resp: 20  20   Height:      Weight:      SpO2: 95% 92% 100% 98%    Intake/Output Summary (Last 24 hours) at 08/20/12 1711 Last data filed at 08/20/12 1300  Gross per 24 hour  Intake    240 ml  Output    900 ml  Net   -660 ml   Filed Weights   08/16/12 0850  Weight: 78.881 kg (173 lb 14.4 oz)    Exam:   General:  Awake alert NAD  Cardiovascular: RRR No MGR . Telemetry shows normal sinus rhythm.  Respiratory: Occasional bilateral wheezing but fair breath sounds and no increased work of breathing.  Abdomen: round, soft +BS non-tender to palpation  CNS: Alert and oriented only to self. No focal deficits.  Extremities: Symmetric 5 x 5 power.  Data Reviewed: Basic Metabolic Panel:  Lab 08/19/12 8416 08/16/12 0440 08/14/12 1802 08/14/12 0945 08/13/12 1821  NA 142 139 -- 138 136  K 4.2 4.5 -- 4.7 4.4  CL 102 103 -- 98 98  CO2 25 28 -- 32 31  GLUCOSE 66* 101* -- 110* 106*  BUN 11 14 -- 14 12  CREATININE 0.97 1.10 1.12 1.22 1.07  CALCIUM 10.1 9.7 -- 10.6* 10.6*  MG 2.0 -- -- -- --  PHOS -- -- -- -- --   Liver Function Tests:  Lab 08/14/12 1802 08/14/12 0945  AST 18 --  ALT 14 --  ALKPHOS  69 --  BILITOT 0.3 --  PROT 7.4 --  ALBUMIN 3.6 3.7   No results found for this basename: LIPASE:5,AMYLASE:5 in the last 168 hours No results found for this basename: AMMONIA:5 in the last 168 hours CBC:  Lab 08/20/12 0445 08/19/12 0430 08/16/12 0440 08/15/12 1442 08/14/12 1802 08/14/12 0945 08/13/12 1821  WBC 8.7 10.7* 8.5 10.3 9.5 -- --  NEUTROABS -- -- -- -- -- 6.1 5.3  HGB 10.2* 10.3* 9.9* 10.6* 10.9* -- --  HCT 34.5* 35.5* 33.0* 35.2* 36.5* -- --  MCV 89.4 92.4 88.7 87.6 89.5 -- --  PLT 233 285 258 278 286 -- --   Cardiac Enzymes:  Lab 08/15/12 1441 08/14/12 2355 08/14/12 1754  CKTOTAL -- -- --   CKMB -- -- --  CKMBINDEX -- -- --  TROPONINI <0.30 <0.30 <0.30   BNP (last 3 results) No results found for this basename: PROBNP:3 in the last 8760 hours CBG:  Lab 08/20/12 1633 08/20/12 1128 08/20/12 0626 08/19/12 2106 08/19/12 1651  GLUCAP 106* 97 90 111* 126*    Recent Results (from the past 240 hour(s))  URINE CULTURE     Status: Normal   Collection Time   08/13/12  8:49 PM      Component Value Range Status Comment   Specimen Description URINE, CATHETERIZED   Final    Special Requests NONE   Final    Culture  Setup Time 08/13/2012 20:45   Final    Colony Count 25,000 COLONIES/ML   Final    Culture PROTEUS MIRABILIS   Final    Report Status 08/17/2012 FINAL   Final    Organism ID, Bacteria PROTEUS MIRABILIS   Final   URINE CULTURE     Status: Normal   Collection Time   08/14/12 11:08 AM      Component Value Range Status Comment   Specimen Description URINE, CLEAN CATCH   Final    Special Requests NONE   Final    Culture  Setup Time 08/14/2012 12:42   Final    Colony Count >=100,000 COLONIES/ML   Final    Culture PROTEUS MIRABILIS   Final    Report Status 08/16/2012 FINAL   Final    Organism ID, Bacteria PROTEUS MIRABILIS   Final      Studies: No results found.  Scheduled Meds:    . amLODipine  5 mg Oral Daily  . brimonidine  1 drop Both Eyes QHS  . cefTRIAXone (ROCEPHIN)  IV  1 g Intravenous Q24H  . dorzolamide-timolol  1 drop Both Eyes BID  . enoxaparin (LOVENOX) injection  40 mg Subcutaneous Q24H  . insulin aspart  0-15 Units Subcutaneous TID WC  . latanoprost  1 drop Left Eye QHS  . levalbuterol  0.63 mg Nebulization Q6H  . [COMPLETED] LORazepam  1 mg Intravenous Once  . montelukast  10 mg Oral QHS  . oxybutynin  5 mg Oral Daily  . risperiDONE  1 mg Oral BID  . Rivastigmine  1 patch Transdermal Daily  . senna-docusate  1 tablet Oral BID  . simvastatin  5 mg Oral q1800  . sodium chloride  500 mL Intravenous Once  . 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

## 2012-08-20 NOTE — Progress Notes (Deleted)
Pt spontaneously converted to sinus rhythm this afternoon. Will continue to monitor.

## 2012-08-21 ENCOUNTER — Ambulatory Visit (HOSPITAL_COMMUNITY): Payer: PRIVATE HEALTH INSURANCE

## 2012-08-21 LAB — BLOOD GAS, ARTERIAL
Acid-Base Excess: 6 mmol/L — ABNORMAL HIGH (ref 0.0–2.0)
Bicarbonate: 30.7 mEq/L — ABNORMAL HIGH (ref 20.0–24.0)
Drawn by: 296031
O2 Content: 2 L/min
O2 Saturation: 99 %
Patient temperature: 98.6
TCO2: 32.3 mmol/L (ref 0–100)
pCO2 arterial: 50.8 mmHg — ABNORMAL HIGH (ref 35.0–45.0)
pH, Arterial: 7.399 (ref 7.350–7.450)
pO2, Arterial: 94.3 mmHg (ref 80.0–100.0)

## 2012-08-21 LAB — CREATININE, SERUM: GFR calc Af Amer: 78 mL/min — ABNORMAL LOW (ref >90)

## 2012-08-21 LAB — GLUCOSE, CAPILLARY
Glucose-Capillary: 77 mg/dL (ref 70–99)
Glucose-Capillary: 102 mg/dL — ABNORMAL HIGH (ref 70–99)
Glucose-Capillary: 76 mg/dL (ref 70–99)

## 2012-08-21 MED ORDER — LORAZEPAM 1 MG PO TABS: 2.0000 mg | ORAL_TABLET | Freq: Four times a day (QID) | ORAL | Status: DC | PRN

## 2012-08-21 MED ORDER — LORAZEPAM 2 MG/ML IJ SOLN
2.0000 mg | Freq: Four times a day (QID) | INTRAMUSCULAR | Status: DC | PRN
  Administered 2012-08-21: 2 mg via INTRAVENOUS
  Administered 2012-08-21 – 2012-08-22 (×2): 4 mg via INTRAVENOUS
  Administered 2012-08-23 – 2012-08-24 (×4): 2 mg via INTRAVENOUS
  Filled 2012-08-21 (×2): qty 1
  Filled 2012-08-21: qty 2
  Filled 2012-08-21 (×3): qty 1
  Filled 2012-08-21: qty 2

## 2012-08-21 MED ORDER — LORAZEPAM 2 MG/ML IJ SOLN: 2.0000 mg | Freq: Four times a day (QID) | INTRAMUSCULAR | Status: DC | PRN

## 2012-08-21 MED ORDER — LORAZEPAM 2 MG/ML IJ SOLN
INTRAMUSCULAR | Status: AC
  Administered 2012-08-21: 2 mg
  Filled 2012-08-21: qty 1

## 2012-08-21 NOTE — Progress Notes (Signed)
Physical Therapy Treatment Patient Details Name: [name] [name] MRN: [id number] DOB: Nov 25, 1930 Today's Date: 08/21/2012 Time: 7829-5621 PT Time Calculation (min): 21 min  PT Assessment / Plan / Recommendation Comments on Treatment Session  Pt admitted with AMS, presents today with increased agitation and limited gait, balance, coordination, cognition, and activity tolerance.  Will continue to benefit from skilled therapy to address these limitations.    Follow Up Recommendations  SNF;Supervision/Assistance - 24 hour                 Frequency Min 3X/week   Plan Discharge plan remains appropriate;Frequency remains appropriate    Precautions / Restrictions Precautions Precautions: Fall Restrictions Weight Bearing Restrictions: No   Pertinent Vitals/Pain BP 159/94 after activity O2sats 97% on 3L via [place]    Mobility  Bed Mobility Bed Mobility: Supine to Sit;Sit to Supine Supine to Sit: 1: +2 Total assist;HOB flat Supine to Sit: Patient Percentage: 70% Sitting - Scoot to Edge of Bed: 4: Min assist Sit to Supine: 4: Min guard Details for Bed Mobility Assistance: cues and assist due to pt 's behavioral state Transfers Transfers: Sit to Stand;Stand to Sit Sit to Stand: 1: +2 Total assist;With upper extremity assist;From bed;From chair/3-in-1 Sit to Stand: Patient Percentage: 30% Stand to Sit: 1: +2 Total assist;With upper extremity assist;To bed;To chair/3-in-1 Stand to Sit: Patient Percentage: 30% Details for Transfer Assistance: vc/tc for safety and sequencing- pt with strong posterior lean and limited ability to maintain standing Ambulation/Gait Ambulation/Gait Assistance: 1: +2 Total assist Ambulation/Gait: Patient Percentage: 30% Ambulation Distance (Feet): 10 Feet Assistive device: 2 person hand held assist Ambulation/Gait Assistance Details: max cueing 2+total assist for progessing LE, varying participation, strong posterior lean, scissor step with very extended  LE, decreased initiation, seated break, PT and NT turned pt in chair to face bed, once told he was going back to bed pt with improved gain but cont. to have posterior lean and req 2+total A for upright and forward progression Gait Pattern: Step-to pattern;Decreased stride length;Decreased hip/knee flexion - right;Decreased hip/knee flexion - left;Decreased weight shift to right;Decreased weight shift to left;Scissoring;Ataxic (extremely variable ) Gait velocity: decr Stairs: No Wheelchair Mobility Wheelchair Mobility: No     PT Goals Acute Rehab PT Goals PT Goal Formulation: With patient PT Goal: Supine/Side to Sit - Progress: Progressing toward goal PT Goal: Sit to Stand - Progress: Progressing toward goal PT Goal: Ambulate - Progress: Not progressing  Visit Information  Last PT Received On: 08/21/12 Assistance Needed: +3 or more (agitated and strong posterior lean, difficulty ambulating)    Subjective Data  Subjective: Where are my shoes?  Patient Stated Goal: not stated   Cognition  Overall Cognitive Status: Difficult to assess Difficult to assess due to: Hard of hearing/deaf;Level of arousal (confused and agitated) Arousal/Alertness: Other (comment) (varies between awake, agitated, lethargic) Orientation Level: Disoriented X4 Behavior During Session: Agitated Cognition - Other Comments: varies quickly from agitated to semi-cooperative to semi-lethargic    Balance  Balance Balance Assessed: Yes Static Sitting Balance Static Sitting - Balance Support: Bilateral upper extremity supported;Feet supported Static Sitting - Level of Assistance: 2: Max assist Static Sitting - Comment/# of Minutes: slumped in chair unale to maintain upright posture Dynamic Standing Balance Dynamic Standing - Balance Support: Bilateral upper extremity supported;During functional activity Dynamic Standing - Level of Assistance: 1: +2 Total assist (Pt = 20%)  End of Session PT - End of  Session Equipment Utilized During Treatment: Gait belt;Oxygen Activity Tolerance: Patient limited by fatigue  Patient left: in bed;Other (comment);with restraints reapplied Psychiatrist in room) Nurse Communication: Mobility status        Sharion Balloon 08/21/2012, 10:59 AM Sharion Balloon, SPT Acute Rehab Services [phone]

## 2012-08-21 NOTE — Consult Note (Signed)
Patient Identification:  [name] [name] Date of Evaluation:  08/21/2012 Reason for Consult: Dementia Agitation  Referring Provider: Dr. Waymon [name] History of Present Illness: Pt is sent from [facility] nursing home for altered mental status.  He was sent back with ED MD talking with MD at nursing home.  He is sent back now from [facility] ED with agitation and confusion.  He is admitted with UTI and treatment.   Past Psychiatric History:Dementia with severe agitation, Depression, delusional features.   Past Medical History:     Past Medical History  Diagnosis Date  . Glaucoma(365)   . Anxiety   . GERD (gastroesophageal reflux disease)   . Hyperlipidemia   . Hypertension   . Chronic abdominal pain   . 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 Chronic respiratory failure   . Altered mental status 06/12/2012; 08/14/2012    Unwitnessed isolated seizure suspected, but because of the EEG being normal, Dr. Gerilyn [name] did not recommend antiseizure therapy.; "violent since 08/13/2012" (08/14/2012)  . Hard of hearing   . Seizure 06/14/2012    Isolated seizure suspected, but not confirmed.  . Bradycardia   . Complication of anesthesia 2005    "couldn't get him woke up when he should have after eye surgery" (08/14/2012)  . Mesothelioma   . Pleural plaque 04/2012  . Exertional dyspnea   . DM type 2 (diabetes mellitus, type 2) 06/13/2012       Past Surgical History  Procedure Date  . Inguinal hernia repair   . Prostate surgery   . Left eye surgery for glaucoma at [facility] 10/2009  . Cataract extraction w/ intraocular lens implant 2005    "left" (08/14/2012)    Allergies: No Known Allergies  Current Medications:  Prior to Admission medications   Medication Sig Start Date End Date Taking? Authorizing Provider  albuterol (PROVENTIL) (2.5 MG/3ML) 0.083% nebulizer solution Take 2.5 mg by nebulization 4 (four) times daily.   Yes Historical Provider, MD  amLODipine (NORVASC) 2.5 MG  tablet Take 1 tablet (2.5 mg total) by mouth daily. 07/18/12 07/18/13 Yes [name] Perches, MD  brimonidine (ALPHAGAN P) 0.1 % SOLN Place 1 drop into both eyes at bedtime.    Yes Historical Provider, MD  Dorzolamide HCl-Timolol Mal PF 22.3-6.8 MG/ML SOLN Apply 1 drop to eye 2 (two) times daily.    Yes Historical Provider, MD  hydrOXYzine (ATARAX/VISTARIL) 25 MG tablet Take 25 mg by mouth at bedtime.   Yes Historical Provider, MD  latanoprost (XALATAN) 0.005 % ophthalmic solution Place 1 drop into the left eye at bedtime.    Yes Historical Provider, MD  lovastatin (MEVACOR) 40 MG tablet Take 40 mg by mouth at bedtime.     Yes Historical Provider, MD  metFORMIN (GLUCOPHAGE) 500 MG tablet Take 1 tablet (500 mg total) by mouth 2 (two) times daily with a meal. 04/16/12 04/16/13 Yes [name] Perches, MD  montelukast (SINGULAIR) 10 MG tablet Take 10 mg by mouth at bedtime.     Yes Historical Provider, MD  Multiple Vitamins-Minerals (MULTIVITAMIN WITH MINERALS) tablet Take 1 tablet by mouth daily. 06/14/12  Yes [name] Cousin, MD  omeprazole (PRILOSEC) 20 MG capsule Take 20 mg by mouth daily.   Yes Historical Provider, MD  oxybutynin (DITROPAN-XL) 5 MG 24 hr tablet Take 5 mg by mouth daily.     Yes Historical Provider, MD  pilocarpine (PILOCAR) 1 % ophthalmic solution Place 1 drop into both eyes Daily.  07/14/12  Yes Historical Provider,  MD  rivastigmine (EXELON) 4.6 mg/24hr Place 1 patch onto the skin daily.   Yes Historical Provider, MD  senna-docusate (DOC-Q-LAX) 8.6-50 MG per tablet Take 1 tablet by mouth 2 (two) times daily.   Yes Historical Provider, MD  Vitamin D, Ergocalciferol, (DRISDOL) 50000 UNITS CAPS Take 50,000 Units by mouth every 7 (seven) days. tuesdays   Yes Historical Provider, MD    Social History:    reports that he has quit smoking. His smoking use included Cigarettes. He has a 80 pack-year smoking history. He has never used smokeless tobacco. He reports that he drinks alcohol. He reports  that he does not use illicit drugs.   Family History:    Family History  Problem Relation Age of Onset  . Lung cancer Sister     Mental Status Examination/Evaluation: Objective:  Appearance: undernourished, sleeping  Eye Contact::  None  Speech:  none  Volume:  none  Mood:  Very agitated and aggressive when awake  Affect:  hostile to staff  Thought Process:  not assessed, sleeping  Orientation:    Thought Content:    Suicidal Thoughts:    Homicidal Thoughts:    Judgement:    Insight:     DIAGNOSIS:   AXIS I  Dementia, agitated, Disorientation due to UTI   AXIS II  Deferred  AXIS III See medical notes.  AXIS IV other psychosocial or environmental problems, problems related to social environment and assessment pending; [given ativan prior to visit]  AXIS V 41-50 serious symptoms   Assessment/Plan:  Discussed with Dr. Waymon [name], Psych CSW Pt is sleeping in am and upon return visit in afternoon.  Aide reports he was very combative in am and ativan ws given; still combative and ativan was given ~ 1 1/2 hrs later. RECOMMENDATION:  1.  Will continue to see pt. [name] Skinner MD 08/21/2012 11:16 AM

## 2012-08-21 NOTE — Progress Notes (Addendum)
[facility] PROGRESS NOTE  [name] [name] ZOX:[id number] DOB: 10/12/1930 DOA: 08/14/2012 PCP: Syliva Overman, MD  HPI 78 year old African American male patient, resident of a rest home, PMH of senile dementia with delusional or depressive features, HTN, HL, DM 2, hard of hearing, seizure (isolated seizure but EEG said to be normal) has had worsening altered mental status ongoing for about 4 weeks. He was initially admitted to [facility] for unresponsiveness-seizure workup was negative and medication changes were made-reduction in dose of Exelon and Xanax. He was discharged but mental status changes persisted and was seen again at [facility] but was not admitted. He returned on 12/5 to [facility] cone with mental status changes. On admission he was found to have UTI. Was admitted for further evaluation and management.  Assessment/Plan:  Altered mental status/delirium   Likely multifactorial i.e. Infectious encephalopathy and or toxic encephalopathy from medication changes. CT head was negative. Admitted at [facility] 3 weeks back for dementia with delusional symptoms. EEG done there was negative for seizure activity.  Apparently was better on 12/10 but has worsened 12/11. Continues to be agitated and delusional per family and nursing.   For now Continue Exelon patch, Zyprexa Zydis (started on 12/11) and Cogentin and Recruitment consultant. Added when necessary Ativan for agitation. Unable to do proper EKG due to agitation- most recent one shows baseline artiifacts and QTC on 405 msecs. QT on monitor 320 msecs.  Will request a repeat EEG to rule out seizure like activity.  ABG reviewed: elevated CO2 may be chronic and stable. PH normal.  Formal psychiatric consultation requested on 12/11-pending.    Proteus UTI   Completed total 7 days of antibiotics-initially cefepime then Rocephin. Treated.  Dementia:   D/c Exelon patch on 08/17/12, and restarted 08/18/12. Xanax was  discontinued on admission-was not part of home medication-probably gradually tapered and discontinued. Discontinue scheduled Seroquel. 08/18/12. Please see above.   Diabetes mellitus   holding metformin   Initially placed on sliding scale insulin but blood sugars well controlled without it-discontinued SSI.  Controlled. Continue to monitor.   No hypoglycemic episodes.  Chronic resp failure   Hypoxia on admission. Improved. On and off wheezes. Chest xray neg and O2 at 2L. Sats 92-100. We'll check ABG to rule out CO2 retention.  HTN   Fair control. Continue home norvasc. monitor   Anemia  Stable  Code Status: DNR Family Communication:  discussed with 2 daughters, son and daughter-in-law at bedside.  Disposition Plan: DC to assisted living when stable.   Consultants:  None  Procedures:  none  Antibiotics:  Rocephin-discontinued on 12/11.  HPI/Subjective: Agitated.  Objective: Filed Vitals:   08/20/12 1505 08/20/12 2003 08/21/12 0455 08/21/12 0909  BP:  131/78 147/81 159/94  Pulse:  94 107   Temp:  98.9 F (37.2 C) 97.5 F (36.4 C)   TempSrc:  Oral Oral   Resp:  20 20   Height:      Weight:      SpO2: 98% 99% 97% 97%    Intake/Output Summary (Last 24 hours) at 08/21/12 1138 Last data filed at 08/21/12 0825  Gross per 24 hour  Intake    363 ml  Output    250 ml  Net    113 ml   Filed Weights   08/16/12 0850  Weight: 78.881 kg (173 lb 14.4 oz)    Exam:   General:   agitated and in restraints.   Cardiovascular: RRR No MGR . Telemetry shows  normal sinus rhythm-ST in 100's.  Respiratory: Occasional bilateral wheezing but fair breath sounds and no increased work of breathing.  Abdomen: round, soft +BS non-tender to palpation  CNS: Alert and oriented only to self. No focal deficits.  Extremities: Symmetric 5 x 5 power.  Data Reviewed: Basic Metabolic Panel:  Lab 08/21/12 4782 08/19/12 0430 08/16/12 0440 08/14/12 1802  NA -- 142 139 --  K  -- 4.2 4.5 --  CL -- 102 103 --  CO2 -- 25 28 --  GLUCOSE -- 66* 101* --  BUN -- 11 14 --  CREATININE 1.03 0.97 1.10 1.12  CALCIUM -- 10.1 9.7 --  MG -- 2.0 -- --  PHOS -- -- -- --   Liver Function Tests:  Lab 08/14/12 1802  AST 18  ALT 14  ALKPHOS 69  BILITOT 0.3  PROT 7.4  ALBUMIN 3.6   No results found for this basename: LIPASE:5,AMYLASE:5 in the last 168 hours No results found for this basename: AMMONIA:5 in the last 168 hours CBC:  Lab 08/20/12 0445 08/19/12 0430 08/16/12 0440 08/15/12 1442 08/14/12 1802  WBC 8.7 10.7* 8.5 10.3 9.5  NEUTROABS -- -- -- -- --  HGB 10.2* 10.3* 9.9* 10.6* 10.9*  HCT 34.5* 35.5* 33.0* 35.2* 36.5*  MCV 89.4 92.4 88.7 87.6 89.5  PLT 233 285 258 278 286   Cardiac Enzymes:  Lab 08/15/12 1441 08/14/12 2355 08/14/12 1754  CKTOTAL -- -- --  CKMB -- -- --  CKMBINDEX -- -- --  TROPONINI <0.30 <0.30 <0.30   BNP (last 3 results) No results found for this basename: PROBNP:3 in the last 8760 hours CBG:  Lab 08/21/12 1120 08/21/12 0619 08/20/12 2054 08/20/12 1633 08/20/12 1128  GLUCAP 102* 77 107* 106* 97    Recent Results (from the past 240 hour(s))  URINE CULTURE     Status: Normal   Collection Time   08/13/12  8:49 PM      Component Value Range Status Comment   Specimen Description URINE, CATHETERIZED   Final    Special Requests NONE   Final    Culture  Setup Time 08/13/2012 20:45   Final    Colony Count 25,000 COLONIES/ML   Final    Culture PROTEUS MIRABILIS   Final    Report Status 08/17/2012 FINAL   Final    Organism ID, Bacteria PROTEUS MIRABILIS   Final   URINE CULTURE     Status: Normal   Collection Time   08/14/12 11:08 AM      Component Value Range Status Comment   Specimen Description URINE, CLEAN CATCH   Final    Special Requests NONE   Final    Culture  Setup Time 08/14/2012 12:42   Final    Colony Count >=100,000 COLONIES/ML   Final    Culture PROTEUS MIRABILIS   Final    Report Status 08/16/2012 FINAL   Final     Organism ID, Bacteria PROTEUS MIRABILIS   Final      Studies: No results found.  Scheduled Meds:    . amLODipine  5 mg Oral Daily  . benztropine  0.5 mg Oral BID  . brimonidine  1 drop Both Eyes QHS  . dorzolamide-timolol  1 drop Both Eyes BID  . enoxaparin (LOVENOX) injection  40 mg Subcutaneous Q24H  . hydrOXYzine  25 mg Oral QHS  . latanoprost  1 drop Left Eye QHS  . levalbuterol  0.63 mg Nebulization Q6H  . [COMPLETED] LORazepam      .  montelukast  10 mg Oral QHS  . OLANZapine zydis  5 mg Oral QHS  . oxybutynin  5 mg Oral Daily  . Rivastigmine  1 patch Transdermal Daily  . senna-docusate  1 tablet Oral BID  . simvastatin  5 mg Oral q1800  . sodium chloride  500 mL Intravenous Once  . sodium chloride  3 mL Intravenous Q12H  . [DISCONTINUED] cefTRIAXone (ROCEPHIN)  IV  1 g Intravenous Q24H  . [DISCONTINUED] insulin aspart  0-15 Units Subcutaneous TID WC  . [DISCONTINUED] risperiDONE  1 mg Oral BID   Continuous Infusions:   Principal Problem:  *Altered mental status Active Problems:  HYPERLIPIDEMIA  Presenile dementia with delusional features  GLAUCOMA  HYPERTENSION  GERD  INSOMNIA  Senile dementia with delusional or depressive features  Delirium  UTI (lower urinary tract infection)  Diabetes    Time spent: 30 minutes    [name] [name]  [facility] Pager: [phone]   If 8PM-8AM, please contact night-coverage at [email or web address], password [email or web address] 08/21/2012, 11:38 AM  LOS: 7 days

## 2012-08-21 NOTE — Progress Notes (Signed)
OT Cancellation Note  Patient Details Name: [name] [name] MRN: [id number] DOB: 04-Apr-1931   Cancelled Treatment:    Reason Eval/Treat Not Completed: Medical issues which prohibited therapy;Other (comment) (Pt sedated secondary to being combative earlier) Currently resting will not attempt to wake after discussion with sitter.  Proctor Carriker OTR/L Pager number [phone]  08/21/2012, 3:23 PM

## 2012-08-21 NOTE — Progress Notes (Signed)
[name] [name],PT Acute Rehabilitation [phone] (pager)  

## 2012-08-22 ENCOUNTER — Inpatient Hospital Stay (HOSPITAL_COMMUNITY): Payer: PRIVATE HEALTH INSURANCE

## 2012-08-22 LAB — CBC
MCH: 26.3 pg (ref 26.0–34.0)
MCHC: 29.6 g/dL — ABNORMAL LOW (ref 30.0–36.0)
MCV: 89 fL (ref 78.0–100.0)
Platelets: 224 10*3/uL (ref 150–400)
RBC: 3.53 MIL/uL — ABNORMAL LOW (ref 4.22–5.81)

## 2012-08-22 LAB — BASIC METABOLIC PANEL
CO2: 32 mEq/L (ref 19–32)
Calcium: 10 mg/dL (ref 8.4–10.5)
Creatinine, Ser: 0.98 mg/dL (ref 0.50–1.35)
GFR calc non Af Amer: 76 mL/min — ABNORMAL LOW (ref >90)
Glucose, Bld: 73 mg/dL (ref 70–99)
Sodium: 143 mEq/L (ref 135–145)

## 2012-08-22 LAB — GLUCOSE, CAPILLARY: Glucose-Capillary: 66 mg/dL — ABNORMAL LOW (ref 70–99)

## 2012-08-22 MED ORDER — ENSURE COMPLETE PO LIQD
237.0000 mL | Freq: Two times a day (BID) | ORAL | Status: DC
  Administered 2012-08-23 – 2012-08-26 (×5): 237 mL via ORAL

## 2012-08-22 MED ORDER — DIVALPROEX SODIUM 500 MG PO DR TAB
500.0000 mg | DELAYED_RELEASE_TABLET | Freq: Every day | ORAL | Status: DC
  Administered 2012-08-22 – 2012-08-23 (×2): 500 mg via ORAL
  Filled 2012-08-22 (×3): qty 1

## 2012-08-22 MED ORDER — KCL IN DEXTROSE-NACL 20-5-0.9 MEQ/L-%-% IV SOLN
INTRAVENOUS | Status: DC
  Administered 2012-08-22 – 2012-08-24 (×3): via INTRAVENOUS
  Filled 2012-08-22 (×4): qty 1000

## 2012-08-22 MED ORDER — DIVALPROEX SODIUM 250 MG PO DR TAB
250.0000 mg | DELAYED_RELEASE_TABLET | Freq: Every day | ORAL | Status: DC
  Administered 2012-08-23 – 2012-08-24 (×2): 250 mg via ORAL
  Filled 2012-08-22 (×3): qty 1

## 2012-08-22 NOTE — Progress Notes (Signed)
INITIAL NUTRITION ASSESSMENT  DOCUMENTATION CODES Per approved criteria  -Not Applicable   INTERVENTION:  Ensure Complete twice daily between meals (350 kcals, 13 gm protein per 8 fl oz bottle) RD to follow for nutrition care plan  NUTRITION DIAGNOSIS: Inadequate oral intake related to AMS as evidenced by PO intake 10-50%  Goal: Oral intake with meals & supplements to meet >/= 90% of estimated nutrition needs  Monitor:  PO & supplemental intake, weight, labs, I/O's  Reason for Assessment: Malnutrition Screening Tool Report  78 y.o. male  Admitting Dx: Altered mental status  ASSESSMENT: Patient presented with altered mental status; was also found to have a mild UTI; was combative with EMS and currently sitter present at bedside; RD unable to obtain nutrition hx; PO intake 10-50% per flowsheet records; would benefit from addition of nutrition supplements -- RD to order.  Height: Ht Readings from Last 1 Encounters:  08/16/12 5\' 10"  (1.778 m)    Weight: Wt Readings from Last 1 Encounters:  08/16/12 173 lb 14.4 oz (78.881 kg)    Ideal Body Weight: 75.4 kg  % Ideal Body Weight: 104%  Wt Readings from Last 10 Encounters:  08/16/12 173 lb 14.4 oz (78.881 kg)  07/27/12 176 lb 12.9 oz (80.2 kg)  07/18/12 180 lb 6.4 oz (81.829 kg)  06/23/12 175 lb 1.9 oz (79.434 kg)  06/14/12 183 lb 4.8 oz (83.144 kg)  04/16/12 184 lb (83.462 kg)  02/07/12 180 lb 1.3 oz (81.684 kg)  12/12/11 175 lb (79.379 kg)  07/17/11 171 lb 6.4 oz (77.747 kg)  04/24/11 169 lb 1.9 oz (76.712 kg)    Usual Body Weight: 176 lb  % Usual Body Weight: 98%  BMI:  Body mass index is 24.95 kg/(m^2).  Estimated Nutritional Needs: Kcal: 1950-2150 Protein: 90-100 gm Fluid: 1.9-2.1 L  Skin: Intact  Diet Order: Carb Control  EDUCATION NEEDS: -No education needs identified at this time   Intake/Output Summary (Last 24 hours) at 08/22/12 1427 Last data filed at 08/22/12 1300  Gross per 24 hour   Intake    183 ml  Output    575 ml  Net   -392 ml     Labs:   Lab 08/22/12 0505 08/21/12 0430 08/19/12 0430 08/16/12 0440  NA 143 -- 142 139  K 3.4* -- 4.2 4.5  CL 103 -- 102 103  CO2 32 -- 25 28  BUN 7 -- 11 14  CREATININE 0.98 1.03 0.97 --  CALCIUM 10.0 -- 10.1 9.7  MG -- -- 2.0 --  PHOS -- -- -- --  GLUCOSE 73 -- 66* 101*    CBG (last 3)   Basename 08/22/12 1118 08/22/12 0745 08/22/12 0600  GLUCAP 96 119* 66*    Scheduled Meds:   . amLODipine  5 mg Oral Daily  . benztropine  0.5 mg Oral BID  . brimonidine  1 drop Both Eyes QHS  . dorzolamide-timolol  1 drop Both Eyes BID  . enoxaparin (LOVENOX) injection  40 mg Subcutaneous Q24H  . hydrOXYzine  25 mg Oral QHS  . latanoprost  1 drop Left Eye QHS  . levalbuterol  0.63 mg Nebulization Q6H  . montelukast  10 mg Oral QHS  . OLANZapine zydis  5 mg Oral QHS  . oxybutynin  5 mg Oral Daily  . Rivastigmine  1 patch Transdermal Daily  . senna-docusate  1 tablet Oral BID  . simvastatin  5 mg Oral q1800  . sodium chloride  500 mL Intravenous Once  .  sodium chloride  3 mL Intravenous Q12H    Continuous Infusions:   Past Medical History  Diagnosis Date  . Glaucoma(365)   . Anxiety   . GERD (gastroesophageal reflux disease)   . Hyperlipidemia   . Hypertension   . Chronic abdominal pain   . 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 Chronic respiratory failure   . Altered mental status 06/12/2012; 08/14/2012    Unwitnessed isolated seizure suspected, but because of the EEG being normal, Dr. Gerilyn Pilgrim did not recommend antiseizure therapy.; "violent since 08/13/2012" (08/14/2012)  . Hard of hearing   . Seizure 06/14/2012    Isolated seizure suspected, but not confirmed.  . Bradycardia   . Complication of anesthesia 2005    "couldn't get him woke up when he should have after eye surgery" (08/14/2012)  . Mesothelioma   . Pleural plaque 04/2012  . Exertional dyspnea   . DM type 2 (diabetes mellitus, type  2) 06/13/2012    Past Surgical History  Procedure Date  . Inguinal hernia repair   . Prostate surgery   . Left eye surgery for glaucoma at [facility] 10/2009  . Cataract extraction w/ intraocular lens implant 2005    "left" (08/14/2012)    Kirkland Hun, RD, LDN Pager #: [phone] After-Hours Pager #: [phone]

## 2012-08-22 NOTE — Progress Notes (Signed)
PT Cancellation Note  Patient Details Name: [name] [name] MRN: [id number] DOB: July 04, 1931   Cancelled Treatment:    Reason Treat Not Completed: Fatigue/lethargy limiting ability to participate--per nurse tech, pt was very agitated this morning and was hitting and kicking staff. Per RN, he was given meds to reduce these behaviors and has been sleeping soundly. Unable to maintain arousal to attempt PT/mobility at this time.   [name] [name] 08/22/2012, 3:16 PM Pager [phone]

## 2012-08-22 NOTE — Consult Note (Signed)
Reason for Consult: Agitation and confusion Referring Physician: Dr. Jacklynn Barnacle [name] [name] is an 78 y.o. male.  HPI: Patient was seen and chart reviewed. Patient was seen and his family, 3 daughters, grand daughter and son in law was at bed side. He presented with altered mental status from assisted living care group home. He has history of senile dementia with delusional or depressive features. His medical history consist of  HTN, HL, DM 2, hard of hearing, seizure (isolated seizure but EEG said to be normal) has had worsening altered mental status ongoing for about 4 weeks. He was initially admitted to [facility] for unresponsiveness-seizure workup was negative and medication changes were made-reduction in dose of Exelon and Xanax. He was discharged but mental status changes persisted and was seen again at [facility] but was not admitted. He returned on 12/5 to [facility] with mental status changes. On admission he was found to have UTI and treated.   MSE: patient appeared in supine position with four point restraints for safety. He was awake, closed his eyes and constantly talking about making a man with legs, hands etc. He has hard hearing but followed verbal directions. He seems like having hallucinations, delusional and combative on the unit.   Past Medical History  Diagnosis Date  . Glaucoma(365)   . Anxiety   . GERD (gastroesophageal reflux disease)   . Hyperlipidemia   . Hypertension   . Chronic abdominal pain   . 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 Chronic respiratory failure   . Altered mental status 06/12/2012; 08/14/2012    Unwitnessed isolated seizure suspected, but because of the EEG being normal, Dr. Gerilyn Pilgrim did not recommend antiseizure therapy.; "violent since 08/13/2012" (08/14/2012)  . Hard of hearing   . Seizure 06/14/2012    Isolated seizure suspected, but not confirmed.  . Bradycardia   . Complication of anesthesia 2005     "couldn't get him woke up when he should have after eye surgery" (08/14/2012)  . Mesothelioma   . Pleural plaque 04/2012  . Exertional dyspnea   . DM type 2 (diabetes mellitus, type 2) 06/13/2012    Past Surgical History  Procedure Date  . Inguinal hernia repair   . Prostate surgery   . Left eye surgery for glaucoma at [facility] 10/2009  . Cataract extraction w/ intraocular lens implant 2005    "left" (08/14/2012)    Family History  Problem Relation Age of Onset  . Lung cancer Sister     Social History:  reports that he has quit smoking. His smoking use included Cigarettes. He has a 80 pack-year smoking history. He has never used smokeless tobacco. He reports that he drinks alcohol. He reports that he does not use illicit drugs.  Allergies: No Known Allergies  Medications: I have reviewed the patient's current medications.  Results for orders placed during the hospital encounter of 08/14/12 (from the past 48 hour(s))  GLUCOSE, CAPILLARY     Status: Abnormal   Collection Time   08/20/12  8:54 PM      Component Value Range Comment   Glucose-Capillary 107 (*) 70 - 99 mg/dL    Comment 1 Documented in Chart      Comment 2 Notify RN     CREATININE, SERUM     Status: Abnormal   Collection Time   08/21/12  4:30 AM      Component Value Range Comment   Creatinine, Ser 1.03  0.50 -  1.35 mg/dL    GFR calc non Af Amer 67 (*) >90 mL/min    GFR calc Af Amer 78 (*) >90 mL/min   GLUCOSE, CAPILLARY     Status: Normal   Collection Time   08/21/12  6:19 AM      Component Value Range Comment   Glucose-Capillary 77  70 - 99 mg/dL   GLUCOSE, CAPILLARY     Status: Abnormal   Collection Time   08/21/12 11:20 AM      Component Value Range Comment   Glucose-Capillary 102 (*) 70 - 99 mg/dL   BLOOD GAS, ARTERIAL     Status: Abnormal   Collection Time   08/21/12 12:52 PM      Component Value Range Comment   O2 Content 2.0      Delivery systems NASAL CANNULA      pH, Arterial 7.399  7.350 - 7.450     pCO2 arterial 50.8 (*) 35.0 - 45.0 mmHg    pO2, Arterial 94.3  80.0 - 100.0 mmHg    Bicarbonate 30.7 (*) 20.0 - 24.0 mEq/L    TCO2 32.3  0 - 100 mmol/L    Acid-Base Excess 6.0 (*) 0.0 - 2.0 mmol/L    O2 Saturation 99.0      Patient temperature 98.6      Collection site LEFT RADIAL      Drawn by [id number]      Sample type ARTERIAL DRAW      Allens test (pass/fail) PASS  PASS   GLUCOSE, CAPILLARY     Status: Normal   Collection Time   08/21/12  4:08 PM      Component Value Range Comment   Glucose-Capillary 82  70 - 99 mg/dL   GLUCOSE, CAPILLARY     Status: Normal   Collection Time   08/21/12  9:02 PM      Component Value Range Comment   Glucose-Capillary 76  70 - 99 mg/dL   CBC     Status: Abnormal   Collection Time   08/22/12  5:05 AM      Component Value Range Comment   WBC 7.1  4.0 - 10.5 K/uL    RBC 3.53 (*) 4.22 - 5.81 MIL/uL    Hemoglobin 9.3 (*) 13.0 - 17.0 g/dL    HCT 62.9 (*) 52.8 - 52.0 %    MCV 89.0  78.0 - 100.0 fL    MCH 26.3  26.0 - 34.0 pg    MCHC 29.6 (*) 30.0 - 36.0 g/dL    RDW 41.3  24.4 - 01.0 %    Platelets 224  150 - 400 K/uL   BASIC METABOLIC PANEL     Status: Abnormal   Collection Time   08/22/12  5:05 AM      Component Value Range Comment   Sodium 143  135 - 145 mEq/L    Potassium 3.4 (*) 3.5 - 5.1 mEq/L    Chloride 103  96 - 112 mEq/L    CO2 32  19 - 32 mEq/L    Glucose, Bld 73  70 - 99 mg/dL    BUN 7  6 - 23 mg/dL    Creatinine, Ser 2.72  0.50 - 1.35 mg/dL    Calcium 53.6  8.4 - 10.5 mg/dL    GFR calc non Af Amer 76 (*) >90 mL/min    GFR calc Af Amer 88 (*) >90 mL/min   GLUCOSE, CAPILLARY     Status: Abnormal   Collection  Time   08/22/12  6:00 AM      Component Value Range Comment   Glucose-Capillary 66 (*) 70 - 99 mg/dL   GLUCOSE, CAPILLARY     Status: Abnormal   Collection Time   08/22/12  7:45 AM      Component Value Range Comment   Glucose-Capillary 119 (*) 70 - 99 mg/dL   GLUCOSE, CAPILLARY     Status: Normal   Collection Time    08/22/12 11:18 AM      Component Value Range Comment   Glucose-Capillary 96  70 - 99 mg/dL    Comment 1 Documented in Chart      Comment 2 Notify RN     GLUCOSE, CAPILLARY     Status: Normal   Collection Time   08/22/12  4:41 PM      Component Value Range Comment   Glucose-Capillary 92  70 - 99 mg/dL     No results found.  Positive for aggressive behavior and delusional, constant talking and combative. Blood pressure 146/86, pulse 80, temperature 97.9 F (36.6 C), temperature source Axillary, resp. rate 18, height 5\' 10"  (1.778 m), weight 173 lb 14.4 oz (78.881 kg), SpO2 100.00%.   Assessment/Plan: Dementia severe with agitation and hallucinations  Recommendation: Will start Depakote 250 mg qam and 500 mg Qhs for controlling agitation and combative behaviors. Patient family agree with the recommendation.     Jalyiah Shelley,JANARDHAHA R. 08/22/2012, 6:52 PM

## 2012-08-22 NOTE — Progress Notes (Signed)
Portable EEG completed

## 2012-08-22 NOTE — Progress Notes (Signed)
Clinical Social Work Department CLINICAL SOCIAL WORK PSYCHIATRY SERVICE LINE ASSESSMENT 08/22/2012  Patient:  JONANTHAN BOLENDER  Account:  [id number]  Admit Date:  08/14/2012  Clinical Social Worker:  Unk Lightning, LCSW  Date/Time:  08/22/2012 09:00 AM Referred by:  Physician  Date referred:  08/22/2012 Reason for Referral  Behavioral Health Issues   Presenting Symptoms/Problems (In the person's/family's own words):   Patient being combative and aggressive   Abuse/Neglect/Trauma History (check all that apply)  Denies history   Abuse/Neglect/Trauma Comments:   Psychiatric History (check all that apply)  Denies history   Psychiatric medications:  Patient currently taking Zyprexa   Current Mental Health Hospitalizations/Previous Mental Health History:   None reported   Current provider:   PCP   Place and Date:   N/A   Current Medications:   acetaminophen, acetaminophen, LORazepam, LORazepam, LORazepam, ondansetron (ZOFRAN) IV, ondansetron                        . amLODipine  5 mg Oral Daily  . benztropine  0.5 mg Oral BID  . brimonidine  1 drop Both Eyes QHS  . dorzolamide-timolol  1 drop Both Eyes BID  . enoxaparin (LOVENOX) injection  40 mg Subcutaneous Q24H  . hydrOXYzine  25 mg Oral QHS  . latanoprost  1 drop Left Eye QHS  . levalbuterol  0.63 mg Nebulization Q6H  . montelukast  10 mg Oral QHS  . OLANZapine zydis  5 mg Oral QHS  . oxybutynin  5 mg Oral Daily  . Rivastigmine  1 patch Transdermal Daily  . senna-docusate  1 tablet Oral BID  . simvastatin  5 mg Oral q1800  . sodium chloride  500 mL Intravenous Once  . sodium chloride  3 mL Intravenous Q12H   Previous Impatient Admission/Date/Reason:   None reported   Emotional Health / Current Symptoms    Suicide/Self Harm  None reported   Suicide attempt in the past:   Other harmful behavior:   Psychotic/Dissociative Symptoms  Other - See comment   Other Psychotic/Dissociative Symptoms:   Unable to  assess patient directly to assess    Attention/Behavioral Symptoms  Physicial aggression   Other Attention / Behavioral Symptoms:    Cognitive Impairment  Unable to accurately assess   Other Cognitive Impairment:    Mood and Adjustment  Aggressive/frustrated    Stress, Anxiety, Trauma, Any Recent Loss/Stressor  None reported   Anxiety (frequency):   Phobia (specify):   Compulsive behavior (specify):   Obsessive behavior (specify):   Other:   Substance Abuse/Use  In recovery   SBIRT completed (please refer for detailed history):  NA  Self-reported substance use:   Dtr reports patient was an alcoholic but has not drank since 2003-2004   Urinary Drug Screen Completed:  N Alcohol level:   N/A    Environmental/Housing/Living Arrangement  Assisted Living / Group Home   Who is in the home:   ALF   Emergency contact:  Rosa-dtr   Financial  Medicare  Medicaid   Patient's Strengths and Goals (patient's own words):   Patient has stable housing and several children that assist with care   Clinical Social Worker's Interpretive Summary:   CSW received referral due to patient being aggressive and combative. CSW reviewed chart and attempted to meet with patient on two separate occasions. Patient unable to complete assessment.    CSW spoke with sitter who reports patient remains combative this morning but stated that patient  was less aggressive last night. Sitter reports the night before last patient was up the entire night and did not sleep at all. Since CSW unable to assess patient, CSW spoke with dtr Clotilde Dieter). Dtr reports that patient moved into ALF after spouse passed away. Dtr states he was at the same ALF from 2005-Nov 2013. In November, patient was moved to a different ALF. Dtr reports appropriate behavior at the ALF and reports that behaviors in the hospital are new. Dtr reports that patient has no mental health history. Patient was an alcoholic but has not drank since  2003 or 2004.    CSW will staff case with psych MD.   Disposition:  Recommend Psych CSW continuing to support while in hospital

## 2012-08-22 NOTE — Progress Notes (Signed)
[facility] PROGRESS NOTE  TOVIA KISNER RUE:[id number] DOB: [birth year] DOA: 08/14/2012 PCP: Syliva Overman, MD  HPI 78 year old African American male patient, resident of a rest home, PMH of senile dementia with delusional or depressive features, HTN, HL, DM 2, hard of hearing, seizure (isolated seizure but EEG said to be normal) has had worsening altered mental status ongoing for about 4 weeks. He was initially admitted to [facility] for unresponsiveness-seizure workup was negative and medication changes were made-reduction in dose of Exelon and Xanax. He was discharged but mental status changes persisted and was seen again at [facility] but was not admitted. He returned on 12/5 to [facility] with mental status changes. On admission he was found to have UTI. Was admitted for further evaluation and management.  Assessment/Plan:  Altered mental status/delirium   Likely multifactorial i.e. Infectious encephalopathy and or toxic encephalopathy from medication changes. CT head was negative. Admitted at [facility] 3 weeks back for dementia with delusional symptoms. EEG done there was negative for seizure activity.  Apparently was better on 12/10 but has worsened 12/11. Continues to be agitated and delusional per family and nursing.   For now Continue Exelon patch, Zyprexa Zydis (started on 12/11) and Cogentin and Recruitment consultant. Added when necessary Ativan for agitation. Unable to do proper EKG due to agitation- most recent one shows baseline artiifacts and QTC on 405 msecs. QT on monitor 390 msecs.  EEG done 12/13-report pending  ABG reviewed: elevated CO2 may be chronic and stable. PH normal.  Psychiatry was unable to evaluate on 11/12 secondary to sedation. Discussed with psychiatry-await recommendations.   Proteus UTI   Completed total 7 days of antibiotics-initially cefepime then Rocephin. Treated.  Dementia:   D/c Exelon patch on 08/17/12, and restarted  08/18/12. Xanax was discontinued on admission-was not part of home medication-probably gradually tapered and discontinued. Discontinue scheduled Seroquel. 08/18/12. Please see above.   Diabetes mellitus   holding metformin   Initially placed on sliding scale insulin but blood sugars well controlled without it-discontinued SSI.  Controlled. Continue to monitor.   Hypoglycemic CBG that 6 this morning. Patient tolerating some meals. Will place on D5NS and monitor CBGs.  Chronic resp failure   Hypoxia on admission. Improved. On and off wheezes. Chest xray neg and O2 at 2L. Sats 92-100.   HTN   Fair control. Continue home norvasc. monitor   Anemia  Stable  Hypokalemia  Replete in IV fluids   Code Status: DNR Family Communication:  discussed with 2 daughters and son-in-law on 12/12 Disposition Plan: DC to assisted living when stable.   Consultants:  None  Procedures:  none  Antibiotics:  Rocephin-discontinued on 12/11.  HPI/Subjective: Was agitated early this morning, has come down after Ativan. Currently sedated but arousable-mumbles incomprehensibly.  Objective: Filed Vitals:   08/21/12 2030 08/21/12 2147 08/22/12 0340 08/22/12 1249  BP: 177/91  150/83 146/86  Pulse: 85 92 85 80  Temp: 98.2 F (36.8 C)  98 F (36.7 C) 97.9 F (36.6 C)  TempSrc: Oral  Oral Axillary  Resp: 18 18 18 18   Height:      Weight:      SpO2: 100% 97% 100% 100%    Intake/Output Summary (Last 24 hours) at 08/22/12 1748 Last data filed at 08/22/12 1300  Gross per 24 hour  Intake     63 ml  Output    250 ml  Net   -187 ml   Filed Weights   08/16/12 0850  Weight: 78.881 kg (173 lb 14.4 oz)    Exam:   General:   Currently somnolent but arousable. Comfortable.   Cardiovascular: RRR No MGR . Telemetry shows normal sinus rhythm.  Respiratory: Clear to auscultation and no increased work of breathing.  Abdomen: round, soft +BS non-tender to palpation  CNS: Somnolent but  easily arousable. No focal deficits.  Extremities: Symmetric 5 x 5 power.  Data Reviewed: Basic Metabolic Panel:  Lab 08/22/12 1308 08/21/12 0430 08/19/12 0430 08/16/12 0440  NA 143 -- 142 139  K 3.4* -- 4.2 4.5  CL 103 -- 102 103  CO2 32 -- 25 28  GLUCOSE 73 -- 66* 101*  BUN 7 -- 11 14  CREATININE 0.98 1.03 0.97 1.10  CALCIUM 10.0 -- 10.1 9.7  MG -- -- 2.0 --  PHOS -- -- -- --   Liver Function Tests: No results found for this basename: AST:5,ALT:5,ALKPHOS:5,BILITOT:5,PROT:5,ALBUMIN:5 in the last 168 hours No results found for this basename: LIPASE:5,AMYLASE:5 in the last 168 hours No results found for this basename: AMMONIA:5 in the last 168 hours CBC:  Lab 08/22/12 0505 08/20/12 0445 08/19/12 0430 08/16/12 0440  WBC 7.1 8.7 10.7* 8.5  NEUTROABS -- -- -- --  HGB 9.3* 10.2* 10.3* 9.9*  HCT 31.4* 34.5* 35.5* 33.0*  MCV 89.0 89.4 92.4 88.7  PLT 224 233 285 258   Cardiac Enzymes: No results found for this basename: CKTOTAL:5,CKMB:5,CKMBINDEX:5,TROPONINI:5 in the last 168 hours BNP (last 3 results) No results found for this basename: PROBNP:3 in the last 8760 hours CBG:  Lab 08/22/12 1641 08/22/12 1118 08/22/12 0745 08/22/12 0600 08/21/12 2102  GLUCAP 92 96 119* 66* 76    Recent Results (from the past 240 hour(s))  URINE CULTURE     Status: Normal   Collection Time   08/13/12  8:49 PM      Component Value Range Status Comment   Specimen Description URINE, CATHETERIZED   Final    Special Requests NONE   Final    Culture  Setup Time 08/13/2012 20:45   Final    Colony Count 25,000 COLONIES/ML   Final    Culture PROTEUS MIRABILIS   Final    Report Status 08/17/2012 FINAL   Final    Organism ID, Bacteria PROTEUS MIRABILIS   Final   URINE CULTURE     Status: Normal   Collection Time   08/14/12 11:08 AM      Component Value Range Status Comment   Specimen Description URINE, CLEAN CATCH   Final    Special Requests NONE   Final    Culture  Setup Time 08/14/2012 12:42    Final    Colony Count >=100,000 COLONIES/ML   Final    Culture PROTEUS MIRABILIS   Final    Report Status 08/16/2012 FINAL   Final    Organism ID, Bacteria PROTEUS MIRABILIS   Final      Studies: No results found.  Scheduled Meds:    . amLODipine  5 mg Oral Daily  . benztropine  0.5 mg Oral BID  . brimonidine  1 drop Both Eyes QHS  . dorzolamide-timolol  1 drop Both Eyes BID  . enoxaparin (LOVENOX) injection  40 mg Subcutaneous Q24H  . feeding supplement  237 mL Oral BID BM  . hydrOXYzine  25 mg Oral QHS  . latanoprost  1 drop Left Eye QHS  . levalbuterol  0.63 mg Nebulization Q6H  . montelukast  10 mg Oral QHS  . OLANZapine zydis  5 mg Oral QHS  . oxybutynin  5 mg Oral Daily  . Rivastigmine  1 patch Transdermal Daily  . senna-docusate  1 tablet Oral BID  . simvastatin  5 mg Oral q1800  . sodium chloride  500 mL Intravenous Once  . 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

## 2012-08-23 LAB — BASIC METABOLIC PANEL
CO2: 34 mEq/L — ABNORMAL HIGH (ref 19–32)
Chloride: 101 mEq/L (ref 96–112)
Glucose, Bld: 122 mg/dL — ABNORMAL HIGH (ref 70–99)
Sodium: 142 mEq/L (ref 135–145)

## 2012-08-23 LAB — CBC
Hemoglobin: 10.1 g/dL — ABNORMAL LOW (ref 13.0–17.0)
Platelets: 251 10*3/uL (ref 150–400)
RBC: 3.82 MIL/uL — ABNORMAL LOW (ref 4.22–5.81)
WBC: 10.3 10*3/uL (ref 4.0–10.5)

## 2012-08-23 LAB — GLUCOSE, CAPILLARY
Glucose-Capillary: 96 mg/dL (ref 70–99)
Glucose-Capillary: 125 mg/dL — ABNORMAL HIGH (ref 70–99)
Glucose-Capillary: 106 mg/dL — ABNORMAL HIGH (ref 70–99)
Glucose-Capillary: 96 mg/dL (ref 70–99)

## 2012-08-23 MED ORDER — SIMETHICONE 80 MG PO CHEW
80.0000 mg | CHEWABLE_TABLET | Freq: Four times a day (QID) | ORAL | Status: DC | PRN
  Filled 2012-08-23: qty 1

## 2012-08-23 NOTE — Procedures (Signed)
EEG NUMBER:  [id number].  REFERRING PHYSICIAN:  Marcellus Scott, MD  INDICATION FOR STUDY:  A 78 year old man with a history of confusion and delirium with encephalopathic state, thought to be possibly toxic or infectious in etiology.  The study was performed to assess severity of encephalopathy as well as to rule out any indication of seizure activity.  This is a routine EEG recording performed during wakefulness.  The patient was noted to be confused and agitated, and required 4 mg of Ativan in order to obtain EEG study.  The predominant background activity consisted of low-amplitude 1-2 hertz diffuse symmetrical continuous delta activity with superimposed 15-20 hertz diffuse low- amplitude beta activity.  Infrequent brief runs of 8 hertz alpha rhythm also recorded.  Photic stimulation and hyperventilation were not performed.  No epileptiform discharges were recorded.  There were no areas of disproportionate focal slowing.  INTERPRETATION:  This EEG showed mild generalized nonspecific slowing of cerebral activity consistent with the patient's history of encephalopathic state.  This slowing can be seen with toxic as well as metabolic and degenerative central nervous system disorders.  No evidence of an epileptic disorder was demonstrated.     [name] Christmas, MD    [id number] D:  08/22/2012 12:19:05  T:  08/23/2012 00:02:05  Job #:  [id number]

## 2012-08-23 NOTE — Progress Notes (Signed)
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 He was initially admitted to [facility] for unresponsiveness-seizure workup was negative and medication changes were made-reduction in dose of Exelon and Xanax. He was discharged but mental status changes persisted and was seen again at [facility] but was not admitted. He returned on 12/5 to [facility] with mental status changes. On admission he was found to have UTI. Was admitted for further evaluation and management.  Assessment/Plan:  Altered mental status/delirium   Likely multifactorial- Infectious encephalopathy (UTI treated), toxic encephalopathy from medication changes, declining dementia with agitation and hallucinations and hospital delirium. CT head was negative. Admitted at [facility] 3 weeks back for dementia with delusional symptoms. EEG done there was negative for seizure activity.  Apparently was better on 12/10 but has worsened 12/11. Improving.  For now Continue Exelon patch. Added when necessary Ativan for agitation. Unable to do proper EKG due to agitation- most recent one shows baseline artiifacts and QTC on 405 msecs. QT on monitor 300 msecs.  EEG done 12/13- no seizure activity  ABG reviewed: elevated CO2 may be chronic and stable. PH normal.  Psychiatry consulted on 12/13 and started patient on Depakote. This morning patient is not agitated and actually is cooperating with care. Discussed with psychiatry who advised discontinuing Zyprexa and Cogentin.  Proteus UTI   Completed total 7 days of antibiotics-initially cefepime then Rocephin.  Treated.  Dementia:   D/c Exelon patch on 08/17/12, and restarted 08/18/12. Xanax was discontinued on admission-was not part of home medication-probably gradually tapered and discontinued. Discontinue scheduled Seroquel. 08/18/12. Please see above.   Diabetes mellitus   holding metformin   Initially placed on sliding scale insulin but blood sugars well controlled without it-discontinued SSI.  Controlled. Continue to monitor.   Hypoglycemic on 12/13 a.m.-on D5NS and monitor CBGs-no further episodes. Tolerating some by mouth. Reduce IV fluids.  Chronic resp failure   Hypoxia on admission. Improved. On and off wheezes. Chest xray neg and O2 at 2L. Sats 92-100.   HTN   Fair control. Continue home norvasc. monitor   Anemia  Stable  Hypokalemia  Repleted   Code Status: DNR Family Communication:  discussed with 2 daughters and son-in-law on 12/12 Disposition Plan: DC to assisted living when stable.   Consultants:  Psychiatry  Procedures:  none  Antibiotics:  Rocephin-discontinued on 12/11.  HPI/Subjective: Still confused. Oriented only to self. Obeys some commands. Not agitated. Cooperative with care  Objective: Filed Vitals:   08/23/12 0240 08/23/12 0349 08/23/12 0948 08/23/12 1113  BP:  143/83  128/69  Pulse:  86    Temp:  98.3 F (36.8 C)    TempSrc:  Oral    Resp:  19    Height:      Weight:      SpO2: 98% 97% 99%     Intake/Output Summary (Last 24 hours) at 08/23/12 1203 Last data filed at 08/23/12 0900  Gross per 24 hour  Intake    300 ml  Output    250 ml  Net     50 ml   Filed Weights   08/16/12 0850  Weight: 78.881 kg (  173 lb 14.4 oz)    Exam:   General:   Alert and confused. Not agitated..   Cardiovascular: RRR No MGR . Telemetry shows normal sinus rhythm.  Respiratory: Clear to auscultation and no increased work of breathing.  Abdomen: round, soft +BS non-tender to palpation  CNS: Alert and oriented to self only. Obeys some  commands. No focal deficits.  Extremities: Symmetric 5 x 5 power.  Data Reviewed: Basic Metabolic Panel:  Lab 08/23/12 1478 08/22/12 0505 08/21/12 0430 08/19/12 0430  NA 142 143 -- 142  K 3.6 3.4* -- 4.2  CL 101 103 -- 102  CO2 34* 32 -- 25  GLUCOSE 122* 73 -- 66*  BUN 6 7 -- 11  CREATININE 0.89 0.98 1.03 0.97  CALCIUM 9.8 10.0 -- 10.1  MG -- -- -- 2.0  PHOS -- -- -- --   Liver Function Tests: No results found for this basename: AST:5,ALT:5,ALKPHOS:5,BILITOT:5,PROT:5,ALBUMIN:5 in the last 168 hours No results found for this basename: LIPASE:5,AMYLASE:5 in the last 168 hours No results found for this basename: AMMONIA:5 in the last 168 hours CBC:  Lab 08/23/12 0730 08/22/12 0505 08/20/12 0445 08/19/12 0430  WBC 10.3 7.1 8.7 10.7*  NEUTROABS -- -- -- --  HGB 10.1* 9.3* 10.2* 10.3*  HCT 34.9* 31.4* 34.5* 35.5*  MCV 91.4 89.0 89.4 92.4  PLT 251 224 233 285   Cardiac Enzymes: No results found for this basename: CKTOTAL:5,CKMB:5,CKMBINDEX:5,TROPONINI:5 in the last 168 hours BNP (last 3 results) No results found for this basename: PROBNP:3 in the last 8760 hours CBG:  Lab 08/23/12 1123 08/23/12 0620 08/22/12 1641 08/22/12 1118 08/22/12 0745  GLUCAP 125* 96 92 96 119*    Recent Results (from the past 240 hour(s))  URINE CULTURE     Status: Normal   Collection Time   08/13/12  8:49 PM      Component Value Range Status Comment   Specimen Description URINE, CATHETERIZED   Final    Special Requests NONE   Final    Culture  Setup Time 08/13/2012 20:45   Final    Colony Count 25,000 COLONIES/ML   Final    Culture PROTEUS MIRABILIS   Final    Report Status 08/17/2012 FINAL   Final    Organism ID, Bacteria PROTEUS MIRABILIS   Final   URINE CULTURE     Status: Normal   Collection Time   08/14/12 11:08 AM      Component Value Range Status Comment   Specimen Description URINE, CLEAN CATCH   Final    Special Requests NONE   Final    Culture  Setup Time 08/14/2012 12:42   Final     Colony Count >=100,000 COLONIES/ML   Final    Culture PROTEUS MIRABILIS   Final    Report Status 08/16/2012 FINAL   Final    Organism ID, Bacteria PROTEUS MIRABILIS   Final      Studies: No results found.  Scheduled Meds:    . amLODipine  5 mg Oral Daily  . benztropine  0.5 mg Oral BID  . brimonidine  1 drop Both Eyes QHS  . divalproex  250 mg Oral Q breakfast  . divalproex  500 mg Oral QHS  . dorzolamide-timolol  1 drop Both Eyes BID  . enoxaparin (LOVENOX) injection  40 mg Subcutaneous Q24H  . feeding supplement  237 mL Oral BID BM  . hydrOXYzine  25 mg Oral QHS  . latanoprost  1 drop Left Eye QHS  .  levalbuterol  0.63 mg Nebulization Q6H  . montelukast  10 mg Oral QHS  . OLANZapine zydis  5 mg Oral QHS  . oxybutynin  5 mg Oral Daily  . Rivastigmine  1 patch Transdermal Daily  . senna-docusate  1 tablet Oral BID  . simvastatin  5 mg Oral q1800  . sodium chloride  500 mL Intravenous Once  . sodium chloride  3 mL Intravenous Q12H   Continuous Infusions:    . dextrose 5 % and 0.9 % NaCl with KCl 20 mEq/L 75 mL/hr at 08/23/12 9562    Principal Problem:  *Altered mental status Active Problems:  HYPERLIPIDEMIA  Presenile dementia with delusional features  GLAUCOMA  HYPERTENSION  GERD  INSOMNIA  Senile dementia with delusional or depressive features  Delirium  UTI (lower urinary tract infection)  Diabetes  Other lab data  TSH: 1.899  RPR on 06/13/12: Nonreactive  Time spent: 30 minutes    HONGALGI,ANAND  [facility] Pager: [phone]   If 8PM-8AM, please contact night-coverage at [email or web address], password [facility] 08/23/2012, 12:03 PM  LOS: 9 days

## 2012-08-24 DIAGNOSIS — K5900 Constipation, unspecified: Secondary | ICD-10-CM | POA: Diagnosis not present

## 2012-08-24 LAB — GLUCOSE, CAPILLARY
Glucose-Capillary: 82 mg/dL (ref 70–99)
Glucose-Capillary: 117 mg/dL — ABNORMAL HIGH (ref 70–99)

## 2012-08-24 LAB — VITAMIN B12: Vitamin B-12: 1195 pg/mL — ABNORMAL HIGH (ref 211–911)

## 2012-08-24 MED ORDER — DIVALPROEX SODIUM 500 MG PO DR TAB
500.0000 mg | DELAYED_RELEASE_TABLET | Freq: Two times a day (BID) | ORAL | Status: DC
  Administered 2012-08-24 – 2012-08-26 (×4): 500 mg via ORAL
  Filled 2012-08-24 (×5): qty 1

## 2012-08-24 MED ORDER — SENNOSIDES-DOCUSATE SODIUM 8.6-50 MG PO TABS
2.0000 | ORAL_TABLET | Freq: Two times a day (BID) | ORAL | Status: DC
  Administered 2012-08-24 – 2012-08-26 (×4): 2 via ORAL
  Filled 2012-08-24 (×5): qty 2

## 2012-08-24 MED ORDER — POLYETHYLENE GLYCOL 3350 17 G PO PACK
17.0000 g | PACK | Freq: Every day | ORAL | Status: DC
  Administered 2012-08-24 – 2012-08-26 (×3): 17 g via ORAL
  Filled 2012-08-24 (×3): qty 1

## 2012-08-24 MED ORDER — DIVALPROEX SODIUM 250 MG PO DR TAB
250.0000 mg | DELAYED_RELEASE_TABLET | Freq: Once | ORAL | Status: AC
  Administered 2012-08-24: 250 mg via ORAL
  Filled 2012-08-24: qty 1

## 2012-08-24 MED ORDER — BISACODYL 10 MG RE SUPP
10.0000 mg | Freq: Once | RECTAL | Status: AC
  Administered 2012-08-24: 10 mg via RECTAL
  Filled 2012-08-24: qty 1

## 2012-08-24 MED ORDER — LORAZEPAM 2 MG/ML IJ SOLN
2.0000 mg | Freq: Two times a day (BID) | INTRAMUSCULAR | Status: DC | PRN
  Filled 2012-08-24: qty 1

## 2012-08-24 NOTE — Progress Notes (Signed)
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 He was initially admitted to [facility] for unresponsiveness-seizure workup was negative and medication changes were made-reduction in dose of Exelon and Xanax. He was discharged but mental status changes persisted and was seen again at [facility] but was not admitted. He returned on 12/5 to [facility] with mental status changes. On admission he was found to have UTI. Was admitted for further evaluation and management.  Assessment/Plan:  Altered mental status/delirium   Likely multifactorial- Infectious encephalopathy (UTI treated), toxic encephalopathy from medication changes, declining dementia with agitation and hallucinations and hospital delirium. CT head was negative. Admitted at [facility] 3 weeks back for dementia with delusional symptoms. EEG done there was negative for seizure activity.  Apparently was better on 12/10 but worsened 12/11. Improving after Depakote.  Continue Exelon patch. Reduce when necessary Ativan for agitation.   EEG done 12/13- no seizure activity  ABG reviewed: elevated CO2 may be chronic and stable. PH normal.  Psychiatry consulted on 12/13 and started patient on Depakote. Discussed with psychiatry and discontinued Zyprexa and Cogentin.  Overnight was agitated again- will increase Depakote to 500 mg bid.  Constipation  Make changes to bowel regimen.  Proteus UTI   Completed total 7 days of antibiotics-initially cefepime then Rocephin. Treated.  Dementia:   D/c Exelon patch on 08/17/12, and restarted 08/18/12. Xanax was discontinued  on admission-was not part of home medication-probably gradually tapered and discontinued. Discontinue scheduled Seroquel. 08/18/12. Please see above.   Diabetes mellitus   holding metformin   Initially placed on sliding scale insulin but blood sugars well controlled without it-discontinued SSI.  Controlled. Continue to monitor.   Hypoglycemic on 12/13 a.m.-on D5NS and monitor CBGs-no further episodes. Tolerating some by mouth. Reduce IV fluids.  Chronic resp failure   Hypoxia on admission. Improved. On and off wheezes. Chest xray neg and O2 at 2L. Sats 92-100.   HTN   Fair control. Continue home norvasc. monitor   Anemia  Stable  Hypokalemia  Repleted   Code Status: DNR Family Communication:  discussed with 2 daughters and son-in-law on 12/12 Disposition Plan: DC to ? Possibly SNF on 12/16 if bed available.   Consultants:  Psychiatry  Procedures:  none  Antibiotics:  Rocephin-discontinued on 12/11.  HPI/Subjective: Overnight agitation, but not agitated this am and cooperative. C/O constipation- had small BM 12/14. Poor oral intake.  Objective: Filed Vitals:   08/24/12 0242 08/24/12 0524 08/24/12 0947 08/24/12 1118  BP:  103/63  145/85  Pulse:  71    Temp:  98.6 F (37 C)    TempSrc:  Oral    Resp:  18    Height:      Weight:      SpO2: 96% 98% 93%     Intake/Output Summary (Last 24 hours) at 08/24/12 1315 Last data filed at 08/24/12 0808  Gross per 24 hour  Intake    240 ml  Output    600 ml  Net   -360 ml   Filed Weights   08/16/12 0850  Weight: 78.881 kg (173 lb 14.4 oz)    Exam:   General:  Alert and confused. Not agitated. Hard of hearing.   Cardiovascular: RRR. No MGR . Telemetry shows normal sinus rhythm.  Respiratory: Clear to auscultation and no increased work of breathing.  Abdomen: round, soft +BS non-tender to palpation. ND  CNS: Alert and oriented to self only. Obeys some commands. No focal deficits.  Extremities:  Symmetric 5 x 5 power.  Data Reviewed: Basic Metabolic Panel:  Lab 08/23/12 6213 08/22/12 0505 08/21/12 0430 08/19/12 0430  NA 142 143 -- 142  K 3.6 3.4* -- 4.2  CL 101 103 -- 102  CO2 34* 32 -- 25  GLUCOSE 122* 73 -- 66*  BUN 6 7 -- 11  CREATININE 0.89 0.98 1.03 0.97  CALCIUM 9.8 10.0 -- 10.1  MG -- -- -- 2.0  PHOS -- -- -- --   Liver Function Tests: No results found for this basename: AST:5,ALT:5,ALKPHOS:5,BILITOT:5,PROT:5,ALBUMIN:5 in the last 168 hours No results found for this basename: LIPASE:5,AMYLASE:5 in the last 168 hours No results found for this basename: AMMONIA:5 in the last 168 hours CBC:  Lab 08/23/12 0730 08/22/12 0505 08/20/12 0445 08/19/12 0430  WBC 10.3 7.1 8.7 10.7*  NEUTROABS -- -- -- --  HGB 10.1* 9.3* 10.2* 10.3*  HCT 34.9* 31.4* 34.5* 35.5*  MCV 91.4 89.0 89.4 92.4  PLT 251 224 233 285   Cardiac Enzymes: No results found for this basename: CKTOTAL:5,CKMB:5,CKMBINDEX:5,TROPONINI:5 in the last 168 hours BNP (last 3 results) No results found for this basename: PROBNP:3 in the last 8760 hours CBG:  Lab 08/24/12 1106 08/24/12 0624 08/23/12 2104 08/23/12 1601 08/23/12 1123  GLUCAP 98 82 96 106* 125*    No results found for this or any previous visit (from the past 240 hour(s)).   Studies: No results found.  Scheduled Meds:    . amLODipine  5 mg Oral Daily  . brimonidine  1 drop Both Eyes QHS  . divalproex  500 mg Oral Q12H  . dorzolamide-timolol  1 drop Both Eyes BID  . enoxaparin (LOVENOX) injection  40 mg Subcutaneous Q24H  . feeding supplement  237 mL Oral BID BM  . hydrOXYzine  25 mg Oral QHS  . latanoprost  1 drop Left Eye QHS  . levalbuterol  0.63 mg Nebulization Q6H  . montelukast  10 mg Oral QHS  . oxybutynin  5 mg Oral Daily  . polyethylene glycol  17 g Oral Daily  . Rivastigmine  1 patch Transdermal Daily  . senna-docusate  2 tablet Oral BID  . simvastatin  5 mg Oral q1800  . sodium chloride  500 mL Intravenous Once  .  sodium chloride  3 mL Intravenous Q12H   Continuous Infusions:    . dextrose 5 % and 0.9 % NaCl with KCl 20 mEq/L 30 mL/hr at 08/24/12 1148    Principal Problem:  *Altered mental status Active Problems:  HYPERLIPIDEMIA  Presenile dementia with delusional features  GLAUCOMA  HYPERTENSION  GERD  INSOMNIA  Senile dementia with delusional or depressive features  Delirium  UTI (lower urinary tract infection)  Diabetes  Other lab data  TSH: 1.899  RPR on 06/13/12: Nonreactive  Time spent: 30 minutes    HONGALGI,ANAND  [facility] Pager: [phone]   If 8PM-8AM, please contact night-coverage at [email or web address], password [facility] 08/24/2012, 1:15 PM  LOS: 10 days

## 2012-08-24 NOTE — Progress Notes (Signed)
Pt with complaints of constipation and bloated feeling. Abdomen soft but distended. Orders received to give Miralax and Dulcolax suppository. Both given to pt this AM. Pt had large BM. Will continue to monitor pt.

## 2012-08-25 LAB — COMPREHENSIVE METABOLIC PANEL
Albumin: 2.9 g/dL — ABNORMAL LOW (ref 3.5–5.2)
Alkaline Phosphatase: 63 U/L (ref 39–117)
BUN: 6 mg/dL (ref 6–23)
Calcium: 10.2 mg/dL (ref 8.4–10.5)
Creatinine, Ser: 0.89 mg/dL (ref 0.50–1.35)
Potassium: 4.1 mEq/L (ref 3.5–5.1)
Total Protein: 7.2 g/dL (ref 6.0–8.3)

## 2012-08-25 LAB — GLUCOSE, CAPILLARY
Glucose-Capillary: 79 mg/dL (ref 70–99)
Glucose-Capillary: 110 mg/dL — ABNORMAL HIGH (ref 70–99)
Glucose-Capillary: 169 mg/dL — ABNORMAL HIGH (ref 70–99)
Glucose-Capillary: 106 mg/dL — ABNORMAL HIGH (ref 70–99)

## 2012-08-25 LAB — CBC
HCT: 36.9 % — ABNORMAL LOW (ref 39.0–52.0)
MCHC: 27.6 g/dL — ABNORMAL LOW (ref 30.0–36.0)
RDW: 14.6 % (ref 11.5–15.5)

## 2012-08-25 NOTE — Progress Notes (Addendum)
Physical Therapy Treatment Patient Details Name: [name] [name] MRN: [id number] DOB: 04-14-1931 Today's Date: 08/25/2012 Time: 1001-1040 PT Time Calculation (min): 39 min  PT Assessment / Plan / Recommendation Comments on Treatment Session  Pt admitted with AMS, presents today sitting upright in chair, pleasent but confused.  He was aware he req OT assistance for feeding this morning, and willing to work with PT as well.  Improved communicatin skills despite HOH and vision deficits.  Pt ambulated with RW, significantly shuffled gait, and responded well to vc.  Pt will continue to benefit from skilled physical therapy to address activity tolerance, balance, gait, and strength limitations.      Follow Up Recommendations  SNF;Supervision/Assistance - 24 hour           Equipment Recommendations  None recommended by PT       Frequency Min 3X/week   Plan Discharge plan remains appropriate;Frequency remains appropriate    Precautions / Restrictions Precautions Precautions: Fall Restrictions Weight Bearing Restrictions: No   Pertinent Vitals/Pain VSS, denies pain.     Mobility  Bed Mobility Bed Mobility: Not assessed Transfers Transfers: Sit to Stand;Stand to Sit Sit to Stand: 4: Min assist;With upper extremity assist;With armrests;From chair/3-in-1 Stand to Sit: 4: Min assist;With upper extremity assist;With armrests;To chair/3-in-1 Details for Transfer Assistance: vc for postitioning, sequencng, and hand placement, with good recall upon second transfer demonstrated by pt moving feet under him and placing hands on arm rests appropriately   Ambulation/Gait Ambulation/Gait Assistance: 2: +2 total pt=75%  Ambulation Distance (Feet): 90 Feet Assistive device: Rolling walker Ambulation/Gait Assistance Details: Vc and assist for maneuvering RW through tight space within room.  Pt responded well to step-by-step cueing for navigating through room to hallway and back.  Pt stated he  was scared because he can't see where he is going.  Possible that using hand held assist next visit will promote taller posture and improved gait, but questionable if RW benefits of stability outweigh benefits of hand held assist (posture and stability).  Gait Pattern: Decreased step length - right;Decreased step length - left;Decreased hip/knee flexion - right;Decreased hip/knee flexion - left;Decreased dorsiflexion - right;Decreased dorsiflexion - left;Shuffle;Trunk flexed;Narrow base of support Gait velocity: decr General Gait Details: shuffle with pt sliding one foot along other to progress forward slowly Stairs: No Wheelchair Mobility Wheelchair Mobility: No      PT Goals Acute Rehab PT Goals PT Goal: Supine/Side to Sit - Progress: Progressing toward goal PT Goal: Sit to Stand - Progress: Progressing toward goal PT Goal: Ambulate - Progress: Progressing toward goal  Visit Information  Last PT Received On: 08/25/12 Assistance Needed: +2    Subjective Data  Subjective: I can't hear Patient Stated Goal: not stated   Cognition  Overall Cognitive Status: Impaired Area of Impairment: Memory Difficult to assess due to: Hard of hearing/deaf Arousal/Alertness: Awake/alert Orientation Level: Disoriented to;Place;Situation;Time Behavior During Session: [facility] for tasks performed Memory Deficits: decr recall of where he is and why he is here, can state that he has been told he is currently in [place] but does not understand why Cognition - Other Comments: Pt with improved behavior for session, able to follow one step commands inconsistently (secondary to [facility]?), pleasent and willing to work with PT today    Balance  Balance Balance Assessed: Yes Static Sitting Balance Static Sitting - Balance Support: No upper extremity supported;Feet supported Static Sitting - Level of Assistance: 6: Modified independent (Device/Increase time) Static Sitting - Comment/# of Minutes: 1-2 min  sit edge  of chair: pt without lob Static Standing Balance Static Standing - Balance Support: Bilateral upper extremity supported;During functional activity Static Standing - Level of Assistance: 4: Min assist Static Standing - Comment/# of Minutes: 3-5 standing with RW support, vc for posture, mild postural sway  End of Session PT - End of Session Equipment Utilized During Treatment: Gait belt;Oxygen Activity Tolerance: Patient limited by fatigue Patient left: in chair;with call bell/phone within reach;with nursing in room Nurse Communication: Mobility status       Sharion Balloon 08/25/2012, 11:41 AM Sharion Balloon, SPT Acute Rehab Services [phone]   08/25/2012 Veda Canning, PT Pager: [phone]

## 2012-08-25 NOTE — Progress Notes (Signed)
Clinical Social Work  CSW staffed case with psych MD who reports that patient will dc to SNF at dc. CSW updated unit CSW of psych recommendations. CSW is signing off but available if needed.  [place], [place] [phone]

## 2012-08-25 NOTE — Progress Notes (Signed)
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 He was initially admitted to [facility] for unresponsiveness-seizure workup was negative and medication changes were made-reduction in dose of Exelon and Xanax. He was discharged but mental status changes persisted and was seen again at [facility] but was not admitted. He returned on 12/5 to [facility] with mental status changes. On admission he was found to have UTI. Was admitted for further evaluation and management.  Assessment/Plan:  Altered mental status/delirium   Likely multifactorial- Infectious encephalopathy (UTI treated), toxic encephalopathy from medication changes, declining dementia with agitation and hallucinations and hospital delirium. CT head was negative. Admitted at [facility] 3 weeks back for dementia with delusional symptoms. EEG done there was negative for seizure activity.  Apparently was better on 12/10 but worsened 12/11. Improving after Depakote.  Continue Exelon patch. D/C Ativan  EEG done 12/13- no seizure activity  ABG reviewed: elevated CO2 may be chronic and stable. PH normal.  Psychiatry consulted on 12/13 and started patient on Depakote. Discussed with psychiatry and discontinued Zyprexa and Cogentin.  No further agitation. Continue Depakote 500 mg bid.   Constipation  Improved. Large BM on 12/15.  Proteus UTI   Completed total 7 days of antibiotics-initially cefepime then Rocephin. Treated.  Dementia:   D/c Exelon patch on 08/17/12, and restarted 08/18/12. Xanax was discontinued on admission-was not part of home  medication-probably gradually tapered and discontinued. Discontinue scheduled Seroquel. 08/18/12. Please see above.   Diabetes mellitus   holding metformin- resume on d/c   Initially placed on sliding scale insulin but blood sugars well controlled without it-discontinued SSI.  Controlled. Continue to monitor.   Hypoglycemic on 12/13 a.m.-on D5NS and monitor CBGs-no further episodes. Tolerating some by mouth. D/c IVF  Chronic resp failure   Hypoxia on admission. Improved. On and off wheezes. Chest xray neg and O2 at 2L. Sats 92-100.   HTN   Fair control. Continue home norvasc. monitor   Anemia  Stable  Hypokalemia  Repleted   Code Status: DNR Family Communication:  discussed with 2 daughters and son-in-law on 12/12 Disposition Plan: DC to ? Possibly SNF when bed available. Stable for D/C   Consultants:  Psychiatry  Procedures:  none  Antibiotics:  Rocephin-discontinued on 12/11.  HPI/Subjective: No further agitation. Cooperative and communicative. Hard of hearing.  Objective: Filed Vitals:   08/25/12 0147 08/25/12 0434 08/25/12 0839 08/25/12 0852  BP:      Pulse:   90   Temp:      TempSrc:      Resp:      Height:      Weight:      SpO2: 100% 97% 94% 95%    Intake/Output Summary (Last 24 hours) at 08/25/12 1108 Last data filed at 08/25/12 0700  Gross per 24 hour  Intake    360 ml  Output    501 ml  Net   -141 ml   Filed Weights   08/16/12 0850  Weight: 78.881 kg (173 lb 14.4 oz)    Exam:   General:   Alert and oriented to self and partly to place. Not agitated. Hard of hearing.  Sitting comfortably on chair. No restraints.  Cardiovascular: RRR. No MGR .   Respiratory: Clear to auscultation and no increased work of breathing.  Abdomen: round, soft +BS non-tender to palpation. ND  CNS: Alert and oriented self and partly to place. Obeys commands. No focal deficits.  Extremities: Symmetric 5 x 5 power.  Data Reviewed: Basic Metabolic  Panel:  Lab 08/25/12 0450 08/23/12 0730 08/22/12 0505 08/21/12 0430 08/19/12 0430  NA 145 142 143 -- 142  K 4.1 3.6 3.4* -- 4.2  CL 103 101 103 -- 102  CO2 38* 34* 32 -- 25  GLUCOSE 82 122* 73 -- 66*  BUN 6 6 7  -- 11  CREATININE 0.89 0.89 0.98 1.03 0.97  CALCIUM 10.2 9.8 10.0 -- 10.1  MG -- -- -- -- 2.0  PHOS -- -- -- -- --   Liver Function Tests:  Lab 08/25/12 0450  AST 17  ALT 11  ALKPHOS 63  BILITOT 0.2*  PROT 7.2  ALBUMIN 2.9*   No results found for this basename: LIPASE:5,AMYLASE:5 in the last 168 hours No results found for this basename: AMMONIA:5 in the last 168 hours CBC:  Lab 08/25/12 0450 08/23/12 0730 08/22/12 0505 08/20/12 0445 08/19/12 0430  WBC 6.0 10.3 7.1 8.7 10.7*  NEUTROABS -- -- -- -- --  HGB 10.2* 10.1* 9.3* 10.2* 10.3*  HCT 36.9* 34.9* 31.4* 34.5* 35.5*  MCV 93.4 91.4 89.0 89.4 92.4  PLT 231 251 224 233 285   Cardiac Enzymes: No results found for this basename: CKTOTAL:5,CKMB:5,CKMBINDEX:5,TROPONINI:5 in the last 168 hours BNP (last 3 results) No results found for this basename: PROBNP:3 in the last 8760 hours CBG:  Lab 08/25/12 0707 08/24/12 1610 08/24/12 1106 08/24/12 0624 08/23/12 2104  GLUCAP 79 117* 98 82 96    No results found for this or any previous visit (from the past 240 hour(s)).   Studies: No results found.  Scheduled Meds:    . amLODipine  5 mg Oral Daily  . brimonidine  1 drop Both Eyes QHS  . divalproex  500 mg Oral Q12H  . dorzolamide-timolol  1 drop Both Eyes BID  . enoxaparin (LOVENOX) injection  40 mg Subcutaneous Q24H  . feeding supplement  237 mL Oral BID BM  . hydrOXYzine  25 mg Oral QHS  . latanoprost  1 drop Left Eye QHS  . levalbuterol  0.63 mg Nebulization Q6H  . montelukast  10 mg Oral QHS  . oxybutynin  5 mg Oral Daily  . polyethylene glycol  17 g Oral Daily  . Rivastigmine  1 patch Transdermal Daily  . senna-docusate  2 tablet Oral BID  . simvastatin  5 mg Oral q1800  . sodium chloride  500 mL  Intravenous Once  . sodium chloride  3 mL Intravenous Q12H   Continuous Infusions:    . dextrose 5 % and 0.9 % NaCl with KCl 20 mEq/L 30 mL/hr at 08/24/12 1734    Principal Problem:  *Altered mental status Active Problems:  HYPERLIPIDEMIA  Presenile dementia with delusional features  GLAUCOMA  HYPERTENSION  GERD  INSOMNIA  Senile dementia with delusional or depressive features  Delirium  UTI (lower urinary tract infection)  Diabetes  Constipation  Other lab data  TSH: 1.899  RPR on 06/13/12: Nonreactive  Time spent: 30 minutes    Chetara Kropp  [facility] Pager: [phone]   If 8PM-8AM, please contact night-coverage at [email or web address], password [facility] 08/25/2012, 11:08 AM  LOS: 11 days

## 2012-08-25 NOTE — Progress Notes (Signed)
Progress Note f/u cosultation: Patient Identification:  [name] [name] Date of Evaluation:  08/25/2012 Reason for Consult:  Agitated Patient, following long phase of unresponsiveness [sleeping?]  Referring Provider:  Dr. Waymon Amato  History of Present Illness:Pt was admitted from [facility] with altered mental status and is found to have a UTI.  He was initially somnolent.  With change of medications/doses and treatment of UTI, he has become delusional but talkative.    Past Psychiatric History:He has a history of Dementia with severe agitation, Depression, delusional features.     Past Medical History:     Past Medical History  Diagnosis Date  . Glaucoma(365)   . Anxiety   . GERD (gastroesophageal reflux disease)   . Hyperlipidemia   . Hypertension   . Chronic abdominal pain   . 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 Chronic respiratory failure   . Altered mental status 06/12/2012; 08/14/2012    Unwitnessed isolated seizure suspected, but because of the EEG being normal, Dr. Gerilyn Pilgrim did not recommend antiseizure therapy.; "violent since 08/13/2012" (08/14/2012)  . Hard of hearing   . Seizure 06/14/2012    Isolated seizure suspected, but not confirmed.  . Bradycardia   . Complication of anesthesia 2005    "couldn't get him woke up when he should have after eye surgery" (08/14/2012)  . Mesothelioma   . Pleural plaque 04/2012  . Exertional dyspnea   . DM type 2 (diabetes mellitus, type 2) 06/13/2012       Past Surgical History  Procedure Date  . Inguinal hernia repair   . Prostate surgery   . Left eye surgery for glaucoma at [facility] 10/2009  . Cataract extraction w/ intraocular lens implant 2005    "left" (08/14/2012)    Allergies: No Known Allergies  Current Medications:  Prior to Admission medications   Medication Sig Start Date End Date Taking? Authorizing Provider  albuterol (PROVENTIL) (2.5 MG/3ML) 0.083% nebulizer solution Take 2.5 mg by  nebulization 4 (four) times daily.   Yes Historical Provider, MD  amLODipine (NORVASC) 2.5 MG tablet Take 1 tablet (2.5 mg total) by mouth daily. 07/18/12 07/18/13 Yes Kerri Perches, MD  brimonidine (ALPHAGAN P) 0.1 % SOLN Place 1 drop into both eyes at bedtime.    Yes Historical Provider, MD  Dorzolamide HCl-Timolol Mal PF 22.3-6.8 MG/ML SOLN Apply 1 drop to eye 2 (two) times daily.    Yes Historical Provider, MD  hydrOXYzine (ATARAX/VISTARIL) 25 MG tablet Take 25 mg by mouth at bedtime.   Yes Historical Provider, MD  latanoprost (XALATAN) 0.005 % ophthalmic solution Place 1 drop into the left eye at bedtime.    Yes Historical Provider, MD  lovastatin (MEVACOR) 40 MG tablet Take 40 mg by mouth at bedtime.     Yes Historical Provider, MD  metFORMIN (GLUCOPHAGE) 500 MG tablet Take 1 tablet (500 mg total) by mouth 2 (two) times daily with a meal. 04/16/12 04/16/13 Yes Kerri Perches, MD  montelukast (SINGULAIR) 10 MG tablet Take 10 mg by mouth at bedtime.     Yes Historical Provider, MD  Multiple Vitamins-Minerals (MULTIVITAMIN WITH MINERALS) tablet Take 1 tablet by mouth daily. 06/14/12  Yes Elliot Cousin, MD  omeprazole (PRILOSEC) 20 MG capsule Take 20 mg by mouth daily.   Yes Historical Provider, MD  oxybutynin (DITROPAN-XL) 5 MG 24 hr tablet Take 5 mg by mouth daily.     Yes Historical Provider, MD  pilocarpine (PILOCAR) 1 % ophthalmic solution Place 1  drop into both eyes Daily.  07/14/12  Yes Historical Provider, MD  rivastigmine (EXELON) 4.6 mg/24hr Place 1 patch onto the skin daily.   Yes Historical Provider, MD  senna-docusate (DOC-Q-LAX) 8.6-50 MG per tablet Take 1 tablet by mouth 2 (two) times daily.   Yes Historical Provider, MD  Vitamin D, Ergocalciferol, (DRISDOL) 50000 UNITS CAPS Take 50,000 Units by mouth every 7 (seven) days. tuesdays   Yes Historical Provider, MD    Social History:    reports that he has quit smoking. His smoking use included Cigarettes. He has a 80 pack-year  smoking history. He has never used smokeless tobacco. He reports that he drinks alcohol. He reports that he does not use illicit drugs.   Family History:    Family History  Problem Relation Age of Onset  . Lung cancer Sister     Mental Status Examination/Evaluation: Objective:  Appearance: Pt is itting in chair hearing aid in L ear, wears glasses but indicates he does not know who is there  Eye Contact::  Poor  Speech:  Pressured and pt perseverates and stutters trying to talk apidly  Volume:  Normal  Mood:  Normal to exciteable  Affect:  Congruent  Thought Process:  Disorganized  Orientation:  Negative  Thought Content:  non-stop talking, with partial reference to his meal he is eating.  non-hostile remarks  Suicidal Thoughts:  No  Homicidal Thoughts:  No  Judgement:  Impaired  Insight:  Lacking   DIAGNOSIS:   AXIS I  Dementa; Delusional with pressured speech, non-sensical references.   AXIS II  Deferred  AXIS III See medical notes.  AXIS IV other psychosocial or environmental problems, problems related to social environment, problems with primary support group and complex medical problems advancing dementia  AXIS V 51-60 moderate symptoms   Assessment/Plan:  Discussed with Psych CSW Pt is awake, alert and delusional with some comments.  He is oriented to place and recognizes MD Psychiatrist is present.  He has difficulty following a train for thought.  He makes to hostile, angry Remarks.  He affect is jovial and he is distracted with talk and has stopped eating his meal.  Disoriented to purpose.  RECOMMENDATION:  1,   Pt is no longer sedated, non-hostile at this time 2.  Suggest continue sitter for safety 3.  Agree with Depakote 250 am, 500 HS - plan depakote level after 5 days 4.  Agree with Vistaril 25 mg at bedtime 5.  Will Follow pt  Michaline Kindig MD 08/25/2012 10:31 AM

## 2012-08-25 NOTE — Progress Notes (Signed)
Clarification to above note: Pt ambulated with +2 total assist pt=75% (second person for safety, steering RW, and for lines (IV and O2)).   08/25/2012 Veda Canning, PT Pager: [phone]

## 2012-08-25 NOTE — Progress Notes (Signed)
Occupational Therapy Treatment Patient Details Name: [name] [name] MRN: [id number] DOB: [birth year] Today's Date: 08/25/2012 Time: 1308-6578 OT Time Calculation (min): 39 min  OT Assessment / Plan / Recommendation Comments on Treatment Session Pt with no agitation during session and more appropriate, just demonstrates moderate hearing and visual loss.  Able to state that he was told he was at [place], but not able to determine date or time of day.  Needs mod facilitation for self feeding secondary to his eye sight and decreased contrast between objects.     Follow Up Recommendations  No OT follow up             Frequency Min 2X/week   Plan Discharge plan remains appropriate    Precautions / Restrictions Precautions Precautions: Fall Restrictions Weight Bearing Restrictions: No   Pertinent Vitals/Pain HR 90 BPM, O2 sats 94% on 2Ls nasal cannula    ADL  Eating/Feeding: Performed;Moderate assistance Where Assessed - Eating/Feeding: Chair Transfers/Ambulation Related to ADLs: Not attempted ADL Comments: Pt more alert and attempts to follow one step commands.  Demonstrates moderate visual impairments when attempting to feed himself.  Needed mod assist for self feeding with 50% help to get food on his spoon and bring to mouth.       OT Goals Acute Rehab OT Goals Time For Goal Achievement: 09/08/12 ADL Goals Pt Will Perform Eating: with set-up ADL Goal: Eating - Progress: Goal set today Pt Will Perform Grooming: with set-up ADL Goal: Grooming - Progress: Goal set today Pt Will Transfer to Toilet: with min assist;3-in-1;with DME ADL Goal: Toilet Transfer - Progress: Goal set today  Visit Information  Last OT Received On: 08/25/12 Assistance Needed: +1    Subjective Data  Subjective: They say I'm at [facility] but I don't know how I got here. Patient Stated Goal: Pt wants to return to the ALF      Cognition  Overall Cognitive Status: Impaired Area of  Impairment: Memory Difficult to assess due to: Hard of hearing/deaf Arousal/Alertness: Awake/alert Orientation Level: Other (comment) (Oriented to place but not time or situation.) Behavior During Session: [facility] for tasks performed Cognition - Other Comments: Pt appropriate during session demonstrated no agitation but severely hard of hearing.  Did not recognize that it was in the am, thought it was at night.               End of Session OT - End of Session Activity Tolerance: Patient tolerated treatment well Patient left: in chair;with call bell/phone within reach;Other (comment) (respiratory in the room)     Falan Hensler OTR/L Pager number [phone] 08/25/2012, 9:01 AM

## 2012-08-26 DIAGNOSIS — F0390 Unspecified dementia without behavioral disturbance: Secondary | ICD-10-CM

## 2012-08-26 LAB — GLUCOSE, CAPILLARY
Glucose-Capillary: 104 mg/dL — ABNORMAL HIGH (ref 70–99)
Glucose-Capillary: 126 mg/dL — ABNORMAL HIGH (ref 70–99)

## 2012-08-26 MED ORDER — SIMETHICONE 80 MG PO CHEW: 80.0000 mg | CHEWABLE_TABLET | Freq: Four times a day (QID) | ORAL | Status: DC | PRN

## 2012-08-26 MED ORDER — LEVALBUTEROL HCL 0.63 MG/3ML IN NEBU
0.6300 mg | INHALATION_SOLUTION | Freq: Four times a day (QID) | RESPIRATORY_TRACT | Status: DC | PRN
  Filled 2012-08-26: qty 3

## 2012-08-26 MED ORDER — ALBUTEROL SULFATE (2.5 MG/3ML) 0.083% IN NEBU: 2.5000 mg | INHALATION_SOLUTION | RESPIRATORY_TRACT | Status: DC | PRN

## 2012-08-26 MED ORDER — ENSURE COMPLETE PO LIQD: 237.0000 mL | Freq: Two times a day (BID) | ORAL | Status: DC

## 2012-08-26 MED ORDER — AMLODIPINE BESYLATE 5 MG PO TABS: 5.0000 mg | ORAL_TABLET | Freq: Every day | ORAL | Status: DC

## 2012-08-26 MED ORDER — DIVALPROEX SODIUM 500 MG PO DR TAB: 500.0000 mg | DELAYED_RELEASE_TABLET | Freq: Two times a day (BID) | ORAL | Status: DC

## 2012-08-26 MED ORDER — RIVASTIGMINE 13.3 MG/24HR TD PT24: 1.0000 | MEDICATED_PATCH | Freq: Every day | TRANSDERMAL | Status: DC

## 2012-08-26 MED ORDER — POLYETHYLENE GLYCOL 3350 17 G PO PACK: 17.0000 g | PACK | Freq: Every day | ORAL | Status: DC

## 2012-08-26 NOTE — Progress Notes (Signed)
Pt O2 sat dropped to 85% while at rest in the best off of oxygen.  2L O2 [place] reapplied, increased to 93%.  Will continue to monitor.

## 2012-08-26 NOTE — Clinical Social Work Note (Signed)
CSW was informed that Pt has stabilized and is ready for DC to snf. Review of nurse documentation indicates that Pt has been out of restraint and sitter free since Friday evening with no incidents over the weekend. Pt appears to have returned close to baseline and is pleasantly confused.  CSW sent Pt's information out to area SNF's for potential bed availability.  CSW met with family and Pt to discuss dc plans. Pt's family aware of bed offers and would like CSW to confirm with SNF in [place] for availability. CSW followed up with SNF, sent additional clinical information and is awaiting response.  CSW updated FL2 and put in the chart for MD signature.  CSW will f/u with family and staff for possible dc on Tuesday.    Frederico Hamman, LCSW [phone]

## 2012-08-26 NOTE — Discharge Summary (Addendum)
Physician Discharge Summary  [name] [name] ZOX:[id number] DOB: 1931-06-06 DOA: 08/14/2012  PCP: Syliva Overman, MD  Admit date: 08/14/2012 Discharge date: 08/26/2012  Time spent: Greater than 30 minutes  Recommendations for Outpatient Follow-up:  1. With Dr. Syliva Overman, PCP 2. With M.D. at [facility] in 4 days from hospital discharge with repeat labs (CBC, LFTs and Depakote level).  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  Diet recommendation: Heart healthy and diabetic  Filed Weights   08/16/12 0850  Weight: 78.881 kg (173 lb 14.4 oz)    History of present illness:  78 year old African American male patient, resident of a rest home, PMH of senile dementia with delusional or depressive features, HTN, HL, DM 2, hard of hearing, seizure (isolated seizure but EEG said to be normal) has had worsening altered mental status ongoing for about 4 weeks. He was initially admitted to [facility] for unresponsiveness-seizure workup was negative and medication changes were made-reduction in dose of Exelon and Xanax. He was discharged but mental status changes persisted and was seen again at [facility] but was not admitted. He returned on 12/5 to [facility] cone with mental status changes. On admission he was found to have UTI. Was admitted for further evaluation and management.  Hospital Course:   Altered mental status/delirium  Likely multifactorial- Infectious encephalopathy (UTI treated), toxic encephalopathy from medication changes, declining dementia with agitation and hallucinations and hospital delirium.  CT head was negative. EEG  this admission negative for seizure activity.  ABG reviewed: elevated CO2 may be chronic and stable. PH normal.  During the initial part of hospitalization, multiple medication changes were made such as-increasing Exelon dose, added Risperdal and when necessary Ativan and haloperidol. Zyprexa Zydis was started but did not help. Despite these changes patient continued to be combative, agitated, confused and hallucinating. Finally psychiatry was consulted on 12/13 and Depakote was started. Since then patient has done remarkably well. He has no further combativeness or agitation. He is communicative and is oriented to self and place. He is cooperative and is clear. He has been off restraints for 3-4 days now. He is extremely hard of hearing and seeing which make communication with him challenging. Recommend continuing current dose of Depakote and followup repeat labs (CBC, LFTs and Depakote) level in 4 days from hospital discharge. Would definitely tried to minimize psychotropic medication changes.  Constipation  Improved. Large BM on 12/15. Continue bowel regimen.  Proteus UTI  Completed total 7 days of antibiotics-initially cefepime then Rocephin.  Treated.  Dementia:  Xanax had been discontinued prior to this admission.  Please see discussion above.   Diabetes mellitus  holding metformin- resume on d/c  Initially placed on sliding scale insulin but blood sugars well controlled without it-discontinued SSI.  Controlled. Continue to monitor.  Hypoglycemic on 12/13 a.m.-on D5NS and monitor CBGs-no further episodes. Tolerating some by mouth. D/c IVF  Chronic resp failure  Hypoxia on admission. Improved. On and off wheezes. Chest xray neg. apparently was not on oxygen prior to admission.  Oxygen saturation dropped to 85% at rest and on room air. With 2 L of oxygen via nasal cannula, increased to 93%. Continue O2 as above.  HTN  Fluctuating and  mildly uncontrolled. Amlodipine increased to 5  mg by mouth daily. This can be further titrated as outpatient.   Anemia  Stable  Hypokalemia  Repleted  Multiple pleural-based nodules/nodular plaques on chest x-ray  Outpatient followup and evaluation as deemed necessary.  DO NOT RESUSCITATE   Consultants:  Psychiatry  Procedures:  EEG  Antibiotics:  Rocephin-discontinued on 12/11.  Discharge Exam:  Complaints Patient is aware that he was confused earlier part of admission and is asking if he is better. He denies any physical complaints. Ate all breakfast. No dyspnea or pain.  Filed Vitals:   08/26/12 0255 08/26/12 0643 08/26/12 0859 08/26/12 1107  BP:  153/82  182/78  Pulse:  96    Temp:  98.7 F (37.1 C)    TempSrc:  Oral    Resp:  19    Height:      Weight:      SpO2: 98% 99% 98%      General: Alert and oriented to self and to place. Not agitated. Hard of hearing and vision. No restraints.   Cardiovascular: RRR. No MGR .   Respiratory: Clear to auscultation and no increased work of breathing.   Abdomen: round, soft +BS non-tender to palpation. ND   CNS: Alert and oriented self and to place. Obeys commands. No focal deficits.   Extremities: Symmetric 5 x 5 power  Psychiatric: No further hallucinations.  Discharge Instructions      Discharge Orders    Future Appointments: Provider: Department: Dept Phone: Center:   11/21/2012 10:30 AM Kerri Perches, MD [facility] [phone] [facility]     Future Orders Please Complete By Expires   Diet - low sodium heart healthy      Diet Carb Modified      Increase activity slowly      Call MD for:      Comments:   Agitation or worsening confusion.   Call MD for:  difficulty breathing, headache or visual disturbances      Discharge instructions      Comments:   Oxygen via nasal cannula at 2 L per minute continuously.       Medication List     As of 08/26/2012 12:10 PM    STOP taking these medications         rivastigmine 4.6 mg/24hr    Commonly known as: EXELON      TAKE these medications         albuterol (2.5 MG/3ML) 0.083% nebulizer solution   Commonly known as: PROVENTIL   Take 3 mLs (2.5 mg total) by nebulization every 4 (four) hours as needed for wheezing or shortness of breath.      albuterol (2.5 MG/3ML) 0.083% nebulizer solution   Commonly known as: PROVENTIL   Take 2.5 mg by nebulization 4 (four) times daily.      ALPHAGAN P 0.1 % Soln   Generic drug: brimonidine   Place 1 drop into both eyes at bedtime.      amLODipine 5 MG tablet   Commonly known as: NORVASC   Take 1 tablet (5 mg total) by mouth daily.      divalproex 500 MG DR tablet   Commonly known as: DEPAKOTE   Take 1 tablet (500 mg total) by mouth every 12 (twelve) hours.      DOC-Q-LAX 8.6-50 MG per tablet   Generic drug: senna-docusate   Take 1 tablet by mouth 2 (two) times daily.      Dorzolamide HCl-Timolol Mal  PF 22.3-6.8 MG/ML Soln   Apply 1 drop to eye 2 (two) times daily.      feeding supplement Liqd   Take 237 mLs by mouth 2 (two) times daily between meals.      hydrOXYzine 25 MG tablet   Commonly known as: ATARAX/VISTARIL   Take 25 mg by mouth at bedtime.      latanoprost 0.005 % ophthalmic solution   Commonly known as: XALATAN   Place 1 drop into the left eye at bedtime.      lovastatin 40 MG tablet   Commonly known as: MEVACOR   Take 40 mg by mouth at bedtime.      metFORMIN 500 MG tablet   Commonly known as: GLUCOPHAGE   Take 1 tablet (500 mg total) by mouth 2 (two) times daily with a meal.      multivitamin with minerals tablet   Take 1 tablet by mouth daily.      omeprazole 20 MG capsule   Commonly known as: PRILOSEC   Take 20 mg by mouth daily.      oxybutynin 5 MG 24 hr tablet   Commonly known as: DITROPAN-XL   Take 5 mg by mouth daily.      pilocarpine 1 % ophthalmic solution   Commonly known as: PILOCAR   Place 1 drop into both eyes Daily.      polyethylene glycol packet   Commonly known as:  MIRALAX / GLYCOLAX   Take 17 g by mouth daily.      Rivastigmine 13.3 MG/24HR Pt24   Place 1 patch (13.3 mg total) onto the skin daily.      simethicone 80 MG chewable tablet   Commonly known as: MYLICON   Chew 1 tablet (80 mg total) by mouth 4 (four) times daily as needed for flatulence.      SINGULAIR 10 MG tablet   Generic drug: montelukast   Take 10 mg by mouth at bedtime.      Vitamin D (Ergocalciferol) 50000 UNITS Caps   Commonly known as: DRISDOL   Take 50,000 Units by mouth every 7 (seven) days. tuesdays         Follow-up Information    Schedule an appointment as soon as possible for a visit with Syliva Overman, MD.   Contact information:   [address], [address] [place] [address] [phone]       Follow up with MD at Skilled Nursing Facility. Schedule an appointment as soon as possible for a visit in 4 days. (To be seen with repeat labs (CBC, LFT's and Depakote levels))           The results of significant diagnostics from this hospitalization (including imaging, microbiology, ancillary and laboratory) are listed below for reference.    Significant Diagnostic Studies: Dg Chest 2 View  08/14/2012  *RADIOLOGY REPORT*  Clinical Data: Altered mental status, shortness of breath  CHEST - 2 VIEW  Comparison: Multiple prior chest radiograph performed between May 8 and December 2013 plus chest CT 04/16/2012  Findings: There is stable mild cardiomegaly.  The vascular pattern is within normal limits.  Multiple bilateral pleural based nodules are noted to be present some of which are again identified by radiography.  There is no change in these findings.  There is no infiltrate or effusion.  IMPRESSION: No acute findings.  Stable mild cardiomegaly.  Multiple bilateral stable pleural based nodules.   Original Report Authenticated By: Esperanza Heir, M.D.    Ct Head  Wo Contrast  08/14/2012  *RADIOLOGY REPORT*  Clinical Data: Behavioral changes, history of Alzheimer's   CT HEAD WITHOUT CONTRAST  Technique:  Contiguous axial images were obtained from the base of the skull through the vertex without contrast.  Comparison: 07/24/2012  Findings: Calvarium is intact.  There is stable diffuse cortical atrophy.  There is no evidence of vascular territory infarct, hemorrhage, or mass.  There is no change from the prior study.  IMPRESSION: No acute findings.   Original Report Authenticated By: Esperanza Heir, M.D.    Dg Chest Port 1 View  08/18/2012  *RADIOLOGY REPORT*  Clinical Data: Wheezing.  Productive cough.  Shortness of breath.  PORTABLE CHEST - 1 VIEW  Comparison: 08/14/2012  Findings: There are numerous bilateral pleural plaques which appear unchanged since the CT scan dated 04/16/2012.  There are no acute infiltrates or effusions.  Heart size and vascularity are normal.  No acute osseous abnormality.  IMPRESSION: No acute disease.  Multiple nodular pleural plaques.   Original Report Authenticated By: Francene Boyers, M.D.    Dg Chest Port 1 View  08/13/2012  *RADIOLOGY REPORT*  Clinical Data: Shortness of breath and altered mental status. History of mesothelioma.  PORTABLE CHEST - 1 VIEW  Comparison: 07/24/2012  Findings: Chronic lung disease and pleural-based nodularity is stable.  The heart size is stable.  There is stable tortuosity of the thoracic aorta.  No infiltrates, edema or pleural fluid identified.  IMPRESSION: No acute findings.  Stable chronic lung disease and pleural nodules.   Original Report Authenticated By: Irish Lack, M.D.    EEG 08/23/12  INTERPRETATION: This EEG showed mild generalized nonspecific slowing of cerebral activity consistent with the patient's history of encephalopathic state. This slowing can be seen with toxic as well as metabolic and degenerative central nervous system disorders. No evidence of an epileptic disorder was demonstrated.  Microbiology: No results found for this or any previous visit (from the past 240 hour(s)).    Labs: Basic Metabolic Panel:  Lab 08/25/12 1610 08/23/12 0730 08/22/12 0505 08/21/12 0430  NA 145 142 143 --  K 4.1 3.6 3.4* --  CL 103 101 103 --  CO2 38* 34* 32 --  GLUCOSE 82 122* 73 --  BUN 6 6 7  --  CREATININE 0.89 0.89 0.98 1.03  CALCIUM 10.2 9.8 10.0 --  MG -- -- -- --  PHOS -- -- -- --   Liver Function Tests:  Lab 08/25/12 0450  AST 17  ALT 11  ALKPHOS 63  BILITOT 0.2*  PROT 7.2  ALBUMIN 2.9*   No results found for this basename: LIPASE:5,AMYLASE:5 in the last 168 hours No results found for this basename: AMMONIA:5 in the last 168 hours CBC:  Lab 08/25/12 0450 08/23/12 0730 08/22/12 0505 08/20/12 0445  WBC 6.0 10.3 7.1 8.7  NEUTROABS -- -- -- --  HGB 10.2* 10.1* 9.3* 10.2*  HCT 36.9* 34.9* 31.4* 34.5*  MCV 93.4 91.4 89.0 89.4  PLT 231 251 224 233   Cardiac Enzymes: No results found for this basename: CKTOTAL:5,CKMB:5,CKMBINDEX:5,TROPONINI:5 in the last 168 hours BNP: BNP (last 3 results) No results found for this basename: PROBNP:3 in the last 8760 hours CBG:  Lab 08/26/12 0644 08/25/12 2207 08/25/12 1643 08/25/12 1115 08/25/12 0707  GLUCAP 104* 106* 169* 110* 79    Other lab data  TSH: 1.899  RPR on 06/13/12: Non reactive ABG on 12/12 : PH 7.399, PCO2 51, PO2 94, bicarbonate 31 and oxygen saturation 99%. Vitamin B12: 1195  SignedMarcellus Scott  [facility] 08/26/2012, 12:10 PM

## 2012-08-26 NOTE — Progress Notes (Signed)
Patient Identification:  [name] [name] Date of Evaluation:  08/26/2012 Reason for Consult:  Altered mental status  Referring Provider: Dr. Waymon Amato History of Present Illness:Pt admitted with mental status changes.  He was somnolent for some time.  Following medication changes he gradually became alert.  He has visual and hearing impairment.  He was found to have a UTI and was treated.   He became responsive.  He continued to be disoriented to time and place and was talking none stop with.    Past Psychiatric History: History of Dementia with severe agitation, Depression and delusional features Past Medical History:     Past Medical History  Diagnosis Date  . Glaucoma(365)   . Anxiety   . GERD (gastroesophageal reflux disease)   . Hyperlipidemia   . Hypertension   . Chronic abdominal pain   . 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 Chronic respiratory failure   . Altered mental status 06/12/2012; 08/14/2012    Unwitnessed isolated seizure suspected, but because of the EEG being normal, Dr. Gerilyn Pilgrim did not recommend antiseizure therapy.; "violent since 08/13/2012" (08/14/2012)  . Hard of hearing   . Seizure 06/14/2012    Isolated seizure suspected, but not confirmed.  . Bradycardia   . Complication of anesthesia 2005    "couldn't get him woke up when he should have after eye surgery" (08/14/2012)  . Mesothelioma   . Pleural plaque 04/2012  . Exertional dyspnea   . DM type 2 (diabetes mellitus, type 2) 06/13/2012       Past Surgical History  Procedure Date  . Inguinal hernia repair   . Prostate surgery   . Left eye surgery for glaucoma at [facility] 10/2009  . Cataract extraction w/ intraocular lens implant 2005    "left" (08/14/2012)    Allergies: No Known Allergies  Current Medications:  Prior to Admission medications   Medication Sig Start Date End Date Taking? Authorizing Provider  albuterol (PROVENTIL) (2.5 MG/3ML) 0.083% nebulizer solution Take 2.5 mg  by nebulization 4 (four) times daily.   Yes Historical Provider, MD  brimonidine (ALPHAGAN P) 0.1 % SOLN Place 1 drop into both eyes at bedtime.    Yes Historical Provider, MD  Dorzolamide HCl-Timolol Mal PF 22.3-6.8 MG/ML SOLN Apply 1 drop to eye 2 (two) times daily.    Yes Historical Provider, MD  hydrOXYzine (ATARAX/VISTARIL) 25 MG tablet Take 25 mg by mouth at bedtime.   Yes Historical Provider, MD  latanoprost (XALATAN) 0.005 % ophthalmic solution Place 1 drop into the left eye at bedtime.    Yes Historical Provider, MD  lovastatin (MEVACOR) 40 MG tablet Take 40 mg by mouth at bedtime.     Yes Historical Provider, MD  metFORMIN (GLUCOPHAGE) 500 MG tablet Take 1 tablet (500 mg total) by mouth 2 (two) times daily with a meal. 04/16/12 04/16/13 Yes Kerri Perches, MD  montelukast (SINGULAIR) 10 MG tablet Take 10 mg by mouth at bedtime.     Yes Historical Provider, MD  Multiple Vitamins-Minerals (MULTIVITAMIN WITH MINERALS) tablet Take 1 tablet by mouth daily. 06/14/12  Yes Elliot Cousin, MD  omeprazole (PRILOSEC) 20 MG capsule Take 20 mg by mouth daily.   Yes Historical Provider, MD  oxybutynin (DITROPAN-XL) 5 MG 24 hr tablet Take 5 mg by mouth daily.     Yes Historical Provider, MD  pilocarpine (PILOCAR) 1 % ophthalmic solution Place 1 drop into both eyes Daily.  07/14/12  Yes Historical Provider, MD  senna-docusate (DOC-Q-LAX) 8.6-50  MG per tablet Take 1 tablet by mouth 2 (two) times daily.   Yes Historical Provider, MD  Vitamin D, Ergocalciferol, (DRISDOL) 50000 UNITS CAPS Take 50,000 Units by mouth every 7 (seven) days. tuesdays   Yes Historical Provider, MD  albuterol (PROVENTIL) (2.5 MG/3ML) 0.083% nebulizer solution Take 3 mLs (2.5 mg total) by nebulization every 4 (four) hours as needed for wheezing or shortness of breath. 08/26/12   Elease Etienne, MD  amLODipine (NORVASC) 5 MG tablet Take 1 tablet (5 mg total) by mouth daily. 08/26/12   Elease Etienne, MD  divalproex (DEPAKOTE) 500 MG  DR tablet Take 1 tablet (500 mg total) by mouth every 12 (twelve) hours. 08/26/12   Elease Etienne, MD  feeding supplement (ENSURE COMPLETE) LIQD Take 237 mLs by mouth 2 (two) times daily between meals. 08/26/12   Elease Etienne, MD  polyethylene glycol (MIRALAX / GLYCOLAX) packet Take 17 g by mouth daily. 08/26/12   Elease Etienne, MD  Rivastigmine (EXELON) 13.3 MG/24HR PT24 Place 1 patch (13.3 mg total) onto the skin daily. 08/26/12   Elease Etienne, MD  simethicone (MYLICON) 80 MG chewable tablet Chew 1 tablet (80 mg total) by mouth 4 (four) times daily as needed for flatulence. 08/26/12   Elease Etienne, MD    Social History:    reports that he has quit smoking. His smoking use included Cigarettes. He has a 80 pack-year smoking history. He has never used smokeless tobacco. He reports that he drinks alcohol. He reports that he does not use illicit drugs.   Family History:    Family History  Problem Relation Age of Onset  . Lung cancer Sister     Mental Status Examination/Evaluation: Objective:  Appearance: Casual  Eye Contact::  Good  Speech:  Clear and Coherent  Pressured speech  Volume:  Increased  Mood:  euthymic  Affect:  Appropriate and partially confused  Thought Process:  Coherent and he can engage in appropriate conversation and then digresses  Orientation:  Other:  Pt cannot concentrated on MSE, is disoriented to place and situation  Thought Content:  announces personal needs "I need batteries for my hearing aids."  Suicidal Thoughts:  No  Homicidal Thoughts:  No  Judgement:  Impaired  Insight:  dementia   DIAGNOSIS:   AXIS I   Delirium due to UTI, medication effect; Progressive dementia  AXIS II  Deferred  AXIS III See medical notes.  AXIS IV economic problems, other psychosocial or environmental problems, problems related to social environment, problems with primary support group and visual and hearing impairment  AXIS V 41-50 serious symptoms    Assessment/Plan:  Discussed with Dr. Waymon Amato Pt is alert.  He is speaking loudly with pressured speech and some perseveration.  He engages in  [place] and is easily distracted by his own thoughts.  He is cognitively intact to understand  That he will be going to SNF.  He agrees.   Dementia is prominent RECOMMENDATION:  1.  Pt agrees to plan to go to SNF; then asks for fresh batteries - trying to hear better.  2.  Endorse plan discussed with Dr. Waymon Amato to check VPA level after transfer to SNF 3.  No further evaluation.  MD Psychiatrist signs off Elyssa Pendelton MD 08/26/2012 12:21 PM

## 2012-10-10 ENCOUNTER — Emergency Department (HOSPITAL_COMMUNITY): Payer: PRIVATE HEALTH INSURANCE

## 2012-10-10 ENCOUNTER — Encounter (HOSPITAL_COMMUNITY): Payer: Self-pay

## 2012-10-10 ENCOUNTER — Emergency Department (HOSPITAL_COMMUNITY)
Admission: EM | Admit: 2012-10-10 | Discharge: 2012-10-10 | Disposition: A | Discharge status: Home / Self Care | Payer: PRIVATE HEALTH INSURANCE | Attending: Emergency Medicine | Admitting: Emergency Medicine

## 2012-10-10 ENCOUNTER — Other Ambulatory Visit: Payer: Self-pay

## 2012-10-10 DIAGNOSIS — Z79899 Other long term (current) drug therapy: Secondary | ICD-10-CM | POA: Insufficient documentation

## 2012-10-10 DIAGNOSIS — Z8679 Personal history of other diseases of the circulatory system: Secondary | ICD-10-CM | POA: Insufficient documentation

## 2012-10-10 DIAGNOSIS — Z8719 Personal history of other diseases of the digestive system: Secondary | ICD-10-CM | POA: Insufficient documentation

## 2012-10-10 DIAGNOSIS — Z859 Personal history of malignant neoplasm, unspecified: Secondary | ICD-10-CM | POA: Insufficient documentation

## 2012-10-10 DIAGNOSIS — F411 Generalized anxiety disorder: Secondary | ICD-10-CM | POA: Insufficient documentation

## 2012-10-10 DIAGNOSIS — H9190 Unspecified hearing loss, unspecified ear: Secondary | ICD-10-CM | POA: Insufficient documentation

## 2012-10-10 DIAGNOSIS — I10 Essential (primary) hypertension: Secondary | ICD-10-CM | POA: Insufficient documentation

## 2012-10-10 DIAGNOSIS — F22 Delusional disorders: Secondary | ICD-10-CM | POA: Insufficient documentation

## 2012-10-10 DIAGNOSIS — N390 Urinary tract infection, site not specified: Secondary | ICD-10-CM | POA: Insufficient documentation

## 2012-10-10 DIAGNOSIS — G40909 Epilepsy, unspecified, not intractable, without status epilepticus: Secondary | ICD-10-CM | POA: Insufficient documentation

## 2012-10-10 DIAGNOSIS — Z87891 Personal history of nicotine dependence: Secondary | ICD-10-CM | POA: Insufficient documentation

## 2012-10-10 DIAGNOSIS — E119 Type 2 diabetes mellitus without complications: Secondary | ICD-10-CM | POA: Insufficient documentation

## 2012-10-10 DIAGNOSIS — F0392 Unspecified dementia, unspecified severity, with psychotic disturbance: Secondary | ICD-10-CM | POA: Insufficient documentation

## 2012-10-10 DIAGNOSIS — R4182 Altered mental status, unspecified: Secondary | ICD-10-CM | POA: Insufficient documentation

## 2012-10-10 DIAGNOSIS — K219 Gastro-esophageal reflux disease without esophagitis: Secondary | ICD-10-CM | POA: Insufficient documentation

## 2012-10-10 DIAGNOSIS — R4189 Other symptoms and signs involving cognitive functions and awareness: Secondary | ICD-10-CM

## 2012-10-10 DIAGNOSIS — R8271 Bacteriuria: Secondary | ICD-10-CM

## 2012-10-10 DIAGNOSIS — E785 Hyperlipidemia, unspecified: Secondary | ICD-10-CM | POA: Insufficient documentation

## 2012-10-10 DIAGNOSIS — Z8709 Personal history of other diseases of the respiratory system: Secondary | ICD-10-CM | POA: Insufficient documentation

## 2012-10-10 HISTORY — DX: Urinary tract infection, site not specified: N39.0

## 2012-10-10 LAB — CBC WITH DIFFERENTIAL/PLATELET
Eosinophils Relative: 1 % (ref 0–5)
HCT: 36.3 % — ABNORMAL LOW (ref 39.0–52.0)
Lymphocytes Relative: 17 % (ref 12–46)
Lymphs Abs: 2.1 10*3/uL (ref 0.7–4.0)
MCV: 90.1 fL (ref 78.0–100.0)
Monocytes Absolute: 0.9 10*3/uL (ref 0.1–1.0)
RBC: 4.03 MIL/uL — ABNORMAL LOW (ref 4.22–5.81)
RDW: 15.5 % (ref 11.5–15.5)
WBC: 12.2 10*3/uL — ABNORMAL HIGH (ref 4.0–10.5)

## 2012-10-10 LAB — TROPONIN I: Troponin I: <0.3 ng/mL (ref <0.30)

## 2012-10-10 LAB — COMPREHENSIVE METABOLIC PANEL
CO2: 29 mEq/L (ref 19–32)
Calcium: 10.1 mg/dL (ref 8.4–10.5)
Creatinine, Ser: 0.93 mg/dL (ref 0.50–1.35)
GFR calc Af Amer: >90 mL/min (ref >90)
GFR calc non Af Amer: 78 mL/min — ABNORMAL LOW (ref >90)
Glucose, Bld: 97 mg/dL (ref 70–99)

## 2012-10-10 LAB — URINALYSIS, ROUTINE W REFLEX MICROSCOPIC
Nitrite: NEGATIVE
Specific Gravity, Urine: 1.015 (ref 1.005–1.030)
Urobilinogen, UA: 0.2 mg/dL (ref 0.0–1.0)

## 2012-10-10 LAB — VALPROIC ACID LEVEL: Valproic Acid Lvl: 50.8 ug/mL (ref 50.0–100.0)

## 2012-10-10 MED ORDER — SODIUM CHLORIDE 0.9 % IV SOLN
500.0000 mg | Freq: Once | INTRAVENOUS | Status: AC
  Administered 2012-10-10: 500 mg via INTRAVENOUS
  Filled 2012-10-10: qty 5

## 2012-10-10 MED ORDER — CEPHALEXIN 500 MG PO CAPS: 500.0000 mg | ORAL_CAPSULE | Freq: Three times a day (TID) | ORAL | Status: DC

## 2012-10-10 MED ORDER — CEPHALEXIN 500 MG PO CAPS
500.0000 mg | ORAL_CAPSULE | Freq: Once | ORAL | Status: AC
Start: 2012-10-10 — End: 2012-10-10
  Administered 2012-10-10: 500 mg via ORAL
  Filled 2012-10-10: qty 1

## 2012-10-10 MED ORDER — SODIUM CHLORIDE 0.9 % IV SOLN
INTRAVENOUS | Status: DC
  Administered 2012-10-10: 08:00:00 via INTRAVENOUS

## 2012-10-10 MED ORDER — LEVETIRACETAM 250 MG PO TABS: 250.0000 mg | ORAL_TABLET | Freq: Two times a day (BID) | ORAL | Status: DC

## 2012-10-10 NOTE — ED Notes (Signed)
Received report on pt, pt has hearing aid in place, talking to family at bedside, update given on plan of care

## 2012-10-10 NOTE — ED Notes (Signed)
Report given to Sana at [facility].

## 2012-10-10 NOTE — ED Notes (Signed)
[facility] called and stated they were sending over a patient because he was not responding to verbal or tactile stimuli this morning.

## 2012-10-10 NOTE — ED Notes (Signed)
Discharge instructions were also given to pt's daughter's at bedside,

## 2012-10-10 NOTE — ED Notes (Signed)
[facility] here to transport pt to [facility].

## 2012-10-10 NOTE — ED Provider Notes (Signed)
History   This chart was scribed for Ward Givens, MD by Charolett Bumpers, ED Scribe. The patient was seen in room APA14/APA14. Patient's care was started at 0712.    CSN: [id number]  Arrival date & time 10/10/12  4098   First MD Initiated Contact with Patient 10/10/12 [phone]      Chief Complaint  Patient presents with  . Altered Mental Status   Level V Caveat: Dementia.   The history is provided by a caregiver. The history is limited by the condition of the patient. No language interpreter was used.  [name] [name] is a 79 y.o. male who presents to the Emergency Department for altered mental status. Pt has a h/o dementia. He is from [place] and is accompanied by staff. Staff states that when they woke him up for a bath this morning, they noticed that the pt was rigid and not responding to verbal stimuli. She states that he is usually alert and verbal, although confused, but is not verbal today or responding to commands. She states his blood sugar was 84 this morning. She states he was his usual self yesterday. Pt was admitted in early December with AMS, but was combative at that time. He had a UTI at that time which grew out proteus mirabilis which was very sensitive to most antibiotics except nitrofurantoin.  PCP Dr Selena Batten  Past Medical History  Diagnosis Date  . Glaucoma(365)   . Anxiety   . GERD (gastroesophageal reflux disease)   . Hyperlipidemia   . Hypertension   . Chronic abdominal pain   . 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 Chronic respiratory failure   . Altered mental status 06/12/2012; 08/14/2012    Unwitnessed isolated seizure suspected, but because of the EEG being normal, Dr. Gerilyn Pilgrim did not recommend antiseizure therapy.; "violent since 08/13/2012" (08/14/2012)  . Hard of hearing   . Seizure 06/14/2012    Isolated seizure suspected, but not confirmed.  . Bradycardia   . Complication of anesthesia 2005    "couldn't get him woke up when he  should have after eye surgery" (08/14/2012)  . Mesothelioma   . Pleural plaque 04/2012  . Exertional dyspnea   . DM type 2 (diabetes mellitus, type 2) 06/13/2012  . UTI (lower urinary tract infection)     Past Surgical History  Procedure Date  . Inguinal hernia repair   . Prostate surgery   . Left eye surgery for glaucoma at [facility] 10/2009  . Cataract extraction w/ intraocular lens implant 2005    "left" (08/14/2012)    Family History  Problem Relation Age of Onset  . Lung cancer Sister     History  Substance Use Topics  . Smoking status: Former Smoker -- 2.0 packs/day for 40 years    Types: Cigarettes  . Smokeless tobacco: Never Used     Comment: "quit smoking cigarettes ~ 2001"  . Alcohol Use: Yes     Comment: 08/14/2012 "used to drink heavily; stopped ~ 2001"   Living in a NH   Review of Systems  Unable to perform ROS: Dementia    Allergies  Review of patient's allergies indicates no known allergies.  Home Medications   Current Outpatient Rx  Name  Route  Sig  Dispense  Refill  . ALBUTEROL SULFATE (2.5 MG/3ML) 0.083% IN NEBU   Nebulization   Take 2.5 mg by nebulization 4 (four) times daily.         . ALBUTEROL SULFATE (2.5  MG/3ML) 0.083% IN NEBU   Nebulization   Take 3 mLs (2.5 mg total) by nebulization every 4 (four) hours as needed for wheezing or shortness of breath.   75 mL   0   . AMLODIPINE BESYLATE 5 MG PO TABS   Oral   Take 1 tablet (5 mg total) by mouth daily.   30 tablet   0   . BRIMONIDINE TARTRATE 0.1 % OP SOLN   Both Eyes   Place 1 drop into both eyes at bedtime.          Marland Kitchen DIVALPROEX SODIUM 500 MG PO TBEC   Oral   Take 1 tablet (500 mg total) by mouth every 12 (twelve) hours.   60 tablet   0   . DORZOLAMIDE HCL-TIMOLOL MAL PF 22.3-6.8 MG/ML OP SOLN   Ophthalmic   Apply 1 drop to eye 2 (two) times daily.          Marland Kitchen ENSURE COMPLETE PO LIQD   Oral   Take 237 mLs by mouth 2 (two) times daily between meals.         Marland Kitchen  HYDROXYZINE HCL 25 MG PO TABS   Oral   Take 25 mg by mouth at bedtime.         Marland Kitchen LATANOPROST 0.005 % OP SOLN   Left Eye   Place 1 drop into the left eye at bedtime.          Marland Kitchen LOVASTATIN 40 MG PO TABS   Oral   Take 40 mg by mouth at bedtime.           Marland Kitchen METFORMIN HCL 500 MG PO TABS   Oral   Take 1 tablet (500 mg total) by mouth 2 (two) times daily with a meal.   60 tablet   11   . MONTELUKAST SODIUM 10 MG PO TABS   Oral   Take 10 mg by mouth at bedtime.           . MULTI-VITAMIN/MINERALS PO TABS   Oral   Take 1 tablet by mouth daily.   30 tablet   6   . OMEPRAZOLE 20 MG PO CPDR   Oral   Take 20 mg by mouth daily.         . OXYBUTYNIN CHLORIDE ER 5 MG PO TB24   Oral   Take 5 mg by mouth daily.           Marland Kitchen PILOCARPINE HCL 1 % OP SOLN   Both Eyes   Place 1 drop into both eyes Daily.          Marland Kitchen POLYETHYLENE GLYCOL 3350 PO PACK   Oral   Take 17 g by mouth daily.         Marland Kitchen RIVASTIGMINE 13.3 MG/24HR TD PT24   Transdermal   Place 1 patch (13.3 mg total) onto the skin daily.   30 patch   0   . SENNOSIDES-DOCUSATE SODIUM 8.6-50 MG PO TABS   Oral   Take 1 tablet by mouth 2 (two) times daily.         Marland Kitchen SIMETHICONE 80 MG PO CHEW   Oral   Chew 1 tablet (80 mg total) by mouth 4 (four) times daily as needed for flatulence.   30 tablet   0   . VITAMIN D (ERGOCALCIFEROL) 50000 UNITS PO CAPS   Oral   Take 50,000 Units by mouth every 7 (seven) days. tuesdays  BP 128/67  Pulse 70  Temp 98.4 F (36.9 C) (Oral)  Resp 18  Ht 5\' 9"  (1.753 m)  Wt 195 lb (88.451 kg)  BMI 28.80 kg/m2  SpO2 100%  Rectal temp  98.1 normal  Vital signs normal    Physical Exam  Nursing note and vitals reviewed. Constitutional: He appears well-developed and well-nourished. No distress.  HENT:  Head: Normocephalic and atraumatic.  Right Ear: External ear normal.  Left Ear: External ear normal.  Nose: Nose normal.  Mouth/Throat: Oropharynx is clear  and moist. No oropharyngeal exudate.       Edentulous.   Eyes: Conjunctivae normal are normal. Pupils are equal, round, and reactive to light.       Eyes are disconjugate. Deviated to the right in the initial exam.   Neck: No tracheal deviation present. No thyromegaly present.  Cardiovascular: Normal rate, regular rhythm and normal heart sounds.  Exam reveals no gallop and no friction rub.   No murmur heard. Pulmonary/Chest: Effort normal and breath sounds normal. No stridor. No respiratory distress. He has no wheezes. He has no rhonchi. He has no rales.  Abdominal: Soft. Bowel sounds are normal. He exhibits no distension. There is no tenderness. There is no rebound and no guarding.  Musculoskeletal: Normal range of motion. He exhibits no edema and no tenderness.       Extremities are not flaccid. When extremities are moved, he holds them in the position they are left.   Lymphadenopathy:    He has no cervical adenopathy.  Neurological: He displays no Babinski's sign on the right side. He displays no Babinski's sign on the left side.       Does not respond to verbal commands. Appears to have a right facial droop. No Babinski sign noted.    Skin: Skin is warm and dry.    ED Course  Procedures (including critical care time)   Medications  0.9 %  sodium chloride infusion (  Intravenous New Bag/Given 10/10/12 0810)  Keflex 500 mg po  keppra 500 mg IV  DIAGNOSTIC STUDIES: Oxygen Saturation is 100% on 2 L [place], normal by my interpretation.    COORDINATION OF CARE:  07:20-Discussed planned course of treatment with the caregiver including UA, CT scan of head, chest x-ray and basic blood work, who is agreeable at this time.   08:00-Recheck: Pt's family is in the room and pt is now very talkative and appears to be more at baseline.   10:00-Recheck: Informed family of current lab results and imaging. Waiting on urine results. Family states he is talking about his dead wife, but patient has been  talking constantly since family arrived.   10:40-Recheck: Informed family of urine results. Will start pt on antibiotics. Pt's eyes are deviated to the right again and non-verbal. No shaking or jerking noted, but has some movements in his throat. When family spoke to him after about 30 seconds the pt started to respond but seemed confused again. Will start pt on seizure medication as well. I looked at patients right ear per family request. They feel the nursing home is leaving his hearing aids in all night. Pt ear canal is normal, he has some swelling of the helix with some redness.   11:30 Dr Gerilyn Pilgrim states to give keppra 250 mg BID after the keppra 500 mg bolus and schedule another outpatient EEG, he will follow up in the office.   Signed by Noel Christmas, MD on 08/26/12 at 0930  EEG NUMBER: [id number].  REFERRING PHYSICIAN: Marcellus Scott, MD  INDICATION FOR STUDY: A 79 year old man with a history of confusion and  delirium with encephalopathic state, thought to be possibly toxic or  infectious in etiology. The study was performed to assess severity of  encephalopathy as well as to rule out any indication of seizure  activity.  INTERPRETATION: This EEG showed mild generalized nonspecific slowing of  cerebral activity consistent with the patient's history of  encephalopathic state. This slowing can be seen with toxic as well as  metabolic and degenerative central nervous system disorders. No  evidence of an epileptic disorder was demonstrated.  Noel Christmas, MD          Results for orders placed during the hospital encounter of 10/10/12  CBC WITH DIFFERENTIAL      Component Value Range   WBC 12.2 (*) 4.0 - 10.5 K/uL   RBC 4.03 (*) 4.22 - 5.81 MIL/uL   Hemoglobin 11.1 (*) 13.0 - 17.0 g/dL   HCT 14.7 (*) 82.9 - 56.2 %   MCV 90.1  78.0 - 100.0 fL   MCH 27.5  26.0 - 34.0 pg   MCHC 30.6  30.0 - 36.0 g/dL   RDW 13.0  86.5 - 78.4 %   Platelets 234  150 - 400 K/uL    Neutrophils Relative 75  43 - 77 %   Neutro Abs 9.1 (*) 1.7 - 7.7 K/uL   Lymphocytes Relative 17  12 - 46 %   Lymphs Abs 2.1  0.7 - 4.0 K/uL   Monocytes Relative 8  3 - 12 %   Monocytes Absolute 0.9  0.1 - 1.0 K/uL   Eosinophils Relative 1  0 - 5 %   Eosinophils Absolute 0.1  0.0 - 0.7 K/uL   Basophils Relative 0  0 - 1 %   Basophils Absolute 0.0  0.0 - 0.1 K/uL  COMPREHENSIVE METABOLIC PANEL      Component Value Range   Sodium 136  135 - 145 mEq/L   Potassium 5.1  3.5 - 5.1 mEq/L   Chloride 99  96 - 112 mEq/L   CO2 29  19 - 32 mEq/L   Glucose, Bld 97  70 - 99 mg/dL   BUN 10  6 - 23 mg/dL   Creatinine, Ser 6.96  0.50 - 1.35 mg/dL   Calcium 29.5  8.4 - 28.4 mg/dL   Total Protein 7.4  6.0 - 8.3 g/dL   Albumin 3.1 (*) 3.5 - 5.2 g/dL   AST 16  0 - 37 U/L   ALT 11  0 - 53 U/L   Alkaline Phosphatase 63  39 - 117 U/L   Total Bilirubin 0.2 (*) 0.3 - 1.2 mg/dL   GFR calc non Af Amer 78 (*) >90 mL/min   GFR calc Af Amer >90  >90 mL/min  URINALYSIS, ROUTINE W REFLEX MICROSCOPIC      Component Value Range   Color, Urine YELLOW  YELLOW   APPearance CLEAR  CLEAR   Specific Gravity, Urine 1.015  1.005 - 1.030   pH 6.5  5.0 - 8.0   Glucose, UA NEGATIVE  NEGATIVE mg/dL   Hgb urine dipstick TRACE (*) NEGATIVE   Bilirubin Urine NEGATIVE  NEGATIVE   Ketones, ur NEGATIVE  NEGATIVE mg/dL   Protein, ur NEGATIVE  NEGATIVE mg/dL   Urobilinogen, UA 0.2  0.0 - 1.0 mg/dL   Nitrite NEGATIVE  NEGATIVE   Leukocytes, UA TRACE (*) NEGATIVE  CULTURE, BLOOD (SINGLE)  Component Value Range   Specimen Description Blood     Special Requests NONE     Culture NO GROWTH <24 HRS     Report Status PENDING    VALPROIC ACID LEVEL      Component Value Range   Valproic Acid Lvl 50.8  50.0 - 100.0 ug/mL  TROPONIN I      Component Value Range   Troponin I <0.30  <0.30 ng/mL  URINE MICROSCOPIC-ADD ON      Component Value Range   WBC, UA 3-6  <3 WBC/hpf   RBC / HPF 3-6  <3 RBC/hpf   Bacteria, UA MANY (*)  RARE   Laboratory interpretation all normal except mild anemia, bacteria in urine    Dg Chest Portable 1 View  10/10/2012  *RADIOLOGY REPORT*  Clinical Data: Altered mental status.  PORTABLE CHEST - 1 VIEW  Comparison: 08/18/2012  Findings: Heart size and pulmonary vascularity are normal.  No infiltrates or effusions.  Multiple pleural based nodules are noted bilaterally, unchanged.  Extensive emphysematous disease.  No acute osseous abnormality.  IMPRESSION: No acute abnormality.  Emphysema.  Multiple pleural nodules, most likely secondary to asbestos exposure.   Original Report Authenticated By: Francene Boyers, M.D.    Ct Head Wo Contrast  10/10/2012  *RADIOLOGY REPORT*  Clinical Data: Altered mental status  CT HEAD WITHOUT CONTRAST  Technique:  Contiguous axial images were obtained from the base of the skull through the vertex without contrast.  Comparison: 08/14/2012.  Findings: No skull fracture is noted.  Paranasal sinuses and mastoid air cells are unremarkable.  No intracranial hemorrhage, mass effect or midline shift.  Stable cerebral atrophy.  Stable periventricular chronic mild white matter disease.  No acute infarction.  No mass lesion is noted on this unenhanced scan.  Ventricular size is stable from prior exam. Stable postsurgical changes right eye.  IMPRESSION: No acute intracranial abnormality.  Stable cerebral atrophy and chronic periventricular mild white matter disease.   Original Report Authenticated By: Natasha Mead, M.D.       Date: 10/10/2012  Rate: 77  Rhythm: normal sinus rhythm  QRS Axis: normal  Intervals: normal  ST/T Wave abnormalities: nonspecific ST/T changes  Conduction Disutrbances:none  Narrative Interpretation:   Old EKG Reviewed: unchanged from 08/21/1012 (this EKG had a lot of artifact)     1. Unresponsive episode   2. Bacteria in urine   3. Atypical seizure   New Prescriptions   CEPHALEXIN (KEFLEX) 500 MG CAPSULE    Take 1 capsule (500 mg total) by  mouth 3 (three) times daily.   LEVETIRACETAM (KEPPRA) 250 MG TABLET    Take 1 tablet (250 mg total) by mouth every 12 (twelve) hours.    Plan discharge  Devoria Albe, MD, FACEP   MDM   I personally performed the services described in this documentation, which was scribed in my presence. The recorded information has been reviewed and considered.  Devoria Albe, MD, Armando Gang       Ward Givens, MD 10/10/12 [phone]

## 2012-10-10 NOTE — ED Notes (Signed)
[facility] called for transport to [facility],

## 2012-10-12 LAB — URINE CULTURE

## 2012-10-13 NOTE — ED Notes (Signed)
+   urine Patient treated with Keflex-sensitive to same-chart appended per protocol MD. 

## 2012-10-15 LAB — CULTURE, BLOOD (SINGLE): Culture: NO GROWTH

## 2012-10-17 ENCOUNTER — Encounter (HOSPITAL_COMMUNITY): Payer: Self-pay | Admitting: Emergency Medicine

## 2012-10-17 ENCOUNTER — Emergency Department (HOSPITAL_COMMUNITY): Payer: PRIVATE HEALTH INSURANCE

## 2012-10-17 ENCOUNTER — Observation Stay (HOSPITAL_COMMUNITY): Payer: PRIVATE HEALTH INSURANCE

## 2012-10-17 ENCOUNTER — Observation Stay (HOSPITAL_COMMUNITY)
Admission: EM | Admit: 2012-10-17 | Discharge: 2012-10-18 | Disposition: A | Discharge status: Skilled Nursing Facility | Payer: PRIVATE HEALTH INSURANCE | Attending: Internal Medicine | Admitting: Internal Medicine

## 2012-10-17 DIAGNOSIS — F22 Delusional disorders: Secondary | ICD-10-CM

## 2012-10-17 DIAGNOSIS — K5900 Constipation, unspecified: Secondary | ICD-10-CM

## 2012-10-17 DIAGNOSIS — M549 Dorsalgia, unspecified: Secondary | ICD-10-CM

## 2012-10-17 DIAGNOSIS — R0902 Hypoxemia: Secondary | ICD-10-CM

## 2012-10-17 DIAGNOSIS — R7303 Prediabetes: Secondary | ICD-10-CM

## 2012-10-17 DIAGNOSIS — R05 Cough: Secondary | ICD-10-CM

## 2012-10-17 DIAGNOSIS — H409 Unspecified glaucoma: Secondary | ICD-10-CM | POA: Diagnosis present

## 2012-10-17 DIAGNOSIS — D649 Anemia, unspecified: Secondary | ICD-10-CM | POA: Diagnosis present

## 2012-10-17 DIAGNOSIS — F411 Generalized anxiety disorder: Secondary | ICD-10-CM

## 2012-10-17 DIAGNOSIS — R918 Other nonspecific abnormal finding of lung field: Secondary | ICD-10-CM

## 2012-10-17 DIAGNOSIS — I10 Essential (primary) hypertension: Secondary | ICD-10-CM

## 2012-10-17 DIAGNOSIS — R0602 Shortness of breath: Secondary | ICD-10-CM

## 2012-10-17 DIAGNOSIS — R001 Bradycardia, unspecified: Secondary | ICD-10-CM

## 2012-10-17 DIAGNOSIS — R5381 Other malaise: Secondary | ICD-10-CM

## 2012-10-17 DIAGNOSIS — F0392 Unspecified dementia, unspecified severity, with psychotic disturbance: Secondary | ICD-10-CM

## 2012-10-17 DIAGNOSIS — N390 Urinary tract infection, site not specified: Secondary | ICD-10-CM

## 2012-10-17 DIAGNOSIS — E119 Type 2 diabetes mellitus without complications: Secondary | ICD-10-CM | POA: Diagnosis present

## 2012-10-17 DIAGNOSIS — K219 Gastro-esophageal reflux disease without esophagitis: Secondary | ICD-10-CM

## 2012-10-17 DIAGNOSIS — R4182 Altered mental status, unspecified: Secondary | ICD-10-CM | POA: Diagnosis present

## 2012-10-17 DIAGNOSIS — R109 Unspecified abdominal pain: Secondary | ICD-10-CM

## 2012-10-17 DIAGNOSIS — R059 Cough, unspecified: Secondary | ICD-10-CM

## 2012-10-17 DIAGNOSIS — R410 Disorientation, unspecified: Secondary | ICD-10-CM

## 2012-10-17 DIAGNOSIS — R569 Unspecified convulsions: Principal | ICD-10-CM | POA: Diagnosis present

## 2012-10-17 DIAGNOSIS — J309 Allergic rhinitis, unspecified: Secondary | ICD-10-CM

## 2012-10-17 DIAGNOSIS — J9610 Chronic respiratory failure, unspecified whether with hypoxia or hypercapnia: Secondary | ICD-10-CM

## 2012-10-17 DIAGNOSIS — E785 Hyperlipidemia, unspecified: Secondary | ICD-10-CM

## 2012-10-17 DIAGNOSIS — F0390 Unspecified dementia without behavioral disturbance: Secondary | ICD-10-CM | POA: Insufficient documentation

## 2012-10-17 DIAGNOSIS — G4700 Insomnia, unspecified: Secondary | ICD-10-CM

## 2012-10-17 DIAGNOSIS — IMO0002 Reserved for concepts with insufficient information to code with codable children: Secondary | ICD-10-CM | POA: Diagnosis present

## 2012-10-17 LAB — CBC WITH DIFFERENTIAL/PLATELET
HCT: 39.1 % (ref 39.0–52.0)
Hemoglobin: 12.1 g/dL — ABNORMAL LOW (ref 13.0–17.0)
Lymphocytes Relative: 24 % (ref 12–46)
MCHC: 30.9 g/dL (ref 30.0–36.0)
Monocytes Absolute: 0.7 10*3/uL (ref 0.1–1.0)
Monocytes Relative: 10 % (ref 3–12)
Neutro Abs: 4.4 10*3/uL (ref 1.7–7.7)
WBC: 6.8 10*3/uL (ref 4.0–10.5)

## 2012-10-17 LAB — COMPREHENSIVE METABOLIC PANEL
BUN: 17 mg/dL (ref 6–23)
CO2: 28 mEq/L (ref 19–32)
Chloride: 101 mEq/L (ref 96–112)
Creatinine, Ser: 1.02 mg/dL (ref 0.50–1.35)
GFR calc non Af Amer: 68 mL/min — ABNORMAL LOW (ref >90)
Total Bilirubin: 0.3 mg/dL (ref 0.3–1.2)

## 2012-10-17 LAB — GLUCOSE, CAPILLARY
Glucose-Capillary: 69 mg/dL — ABNORMAL LOW (ref 70–99)
Glucose-Capillary: 81 mg/dL (ref 70–99)

## 2012-10-17 LAB — VALPROIC ACID LEVEL: Valproic Acid Lvl: 52 ug/mL (ref 50.0–100.0)

## 2012-10-17 MED ORDER — DORZOLAMIDE HCL 2 % OP SOLN
1.0000 [drp] | Freq: Two times a day (BID) | OPHTHALMIC | Status: DC
  Administered 2012-10-18: 1 [drp] via OPHTHALMIC
  Filled 2012-10-17: qty 10

## 2012-10-17 MED ORDER — LATANOPROST 0.005 % OP SOLN
OPHTHALMIC | Status: AC
  Filled 2012-10-17: qty 2.5

## 2012-10-17 MED ORDER — ONDANSETRON HCL 4 MG/2ML IJ SOLN: 4.0000 mg | INTRAMUSCULAR | Status: DC | PRN

## 2012-10-17 MED ORDER — BISACODYL 10 MG RE SUPP: 10.0000 mg | Freq: Every day | RECTAL | Status: DC | PRN

## 2012-10-17 MED ORDER — ACETAMINOPHEN 650 MG RE SUPP: 650.0000 mg | Freq: Four times a day (QID) | RECTAL | Status: DC | PRN

## 2012-10-17 MED ORDER — TIMOLOL MALEATE 0.5 % OP SOLN
1.0000 [drp] | Freq: Two times a day (BID) | OPHTHALMIC | Status: DC
  Administered 2012-10-17: 1 [drp] via OPHTHALMIC
  Filled 2012-10-17: qty 5

## 2012-10-17 MED ORDER — ENOXAPARIN SODIUM 40 MG/0.4ML ~~LOC~~ SOLN
40.0000 mg | SUBCUTANEOUS | Status: DC
  Administered 2012-10-17: 40 mg via SUBCUTANEOUS
  Filled 2012-10-17: qty 0.4

## 2012-10-17 MED ORDER — LATANOPROST 0.005 % OP SOLN
1.0000 [drp] | Freq: Every day | OPHTHALMIC | Status: DC
  Administered 2012-10-17: 1 [drp] via OPHTHALMIC
  Filled 2012-10-17: qty 2.5

## 2012-10-17 MED ORDER — DORZOLAMIDE HCL-TIMOLOL MAL PF 22.3-6.8 MG/ML OP SOLN: 1.0000 [drp] | Freq: Two times a day (BID) | OPHTHALMIC | Status: DC

## 2012-10-17 MED ORDER — BRIMONIDINE TARTRATE 0.2 % OP SOLN
OPHTHALMIC | Status: AC
  Filled 2012-10-17: qty 5

## 2012-10-17 MED ORDER — DEXTROSE 5 % IV SOLN
1.0000 g | INTRAVENOUS | Status: DC
  Administered 2012-10-17: 1 g via INTRAVENOUS
  Filled 2012-10-17 (×3): qty 10

## 2012-10-17 MED ORDER — DEXTROSE-NACL 5-0.9 % IV SOLN
INTRAVENOUS | Status: DC
  Administered 2012-10-17: 17:00:00 via INTRAVENOUS

## 2012-10-17 MED ORDER — PANTOPRAZOLE SODIUM 40 MG IV SOLR
40.0000 mg | INTRAVENOUS | Status: DC
  Administered 2012-10-17: 40 mg via INTRAVENOUS
  Filled 2012-10-17: qty 40

## 2012-10-17 MED ORDER — BRIMONIDINE TARTRATE 0.15 % OP SOLN
1.0000 [drp] | Freq: Every day | OPHTHALMIC | Status: DC
  Administered 2012-10-17: 1 [drp] via OPHTHALMIC
  Filled 2012-10-17: qty 5

## 2012-10-17 MED ORDER — SODIUM CHLORIDE 0.9 % IV SOLN
500.0000 mg | Freq: Two times a day (BID) | INTRAVENOUS | Status: DC
  Administered 2012-10-17 – 2012-10-18 (×2): 500 mg via INTRAVENOUS
  Filled 2012-10-17 (×6): qty 5

## 2012-10-17 MED ORDER — DEXTROSE 5 % IV SOLN
500.0000 mg | Freq: Three times a day (TID) | INTRAVENOUS | Status: DC
  Administered 2012-10-18 (×2): 500 mg via INTRAVENOUS
  Filled 2012-10-17 (×8): qty 5

## 2012-10-17 MED ORDER — DEXTROSE-NACL 5-0.9 % IV SOLN
INTRAVENOUS | Status: DC
  Administered 2012-10-17: via INTRAVENOUS

## 2012-10-17 MED ORDER — LEVETIRACETAM 500 MG/5ML IV SOLN
INTRAVENOUS | Status: AC
  Filled 2012-10-17: qty 5

## 2012-10-17 MED ORDER — FLEET ENEMA 7-19 GM/118ML RE ENEM: 1.0000 | ENEMA | Freq: Once | RECTAL | Status: AC | PRN

## 2012-10-17 MED ORDER — DEXTROSE 5 % IV SOLN
INTRAVENOUS | Status: AC
  Filled 2012-10-17: qty 10

## 2012-10-17 MED ORDER — VALPROATE SODIUM 500 MG/5ML IV SOLN
INTRAVENOUS | Status: AC
  Filled 2012-10-17: qty 5

## 2012-10-17 MED ORDER — ALBUTEROL SULFATE (5 MG/ML) 0.5% IN NEBU
2.5000 mg | INHALATION_SOLUTION | Freq: Four times a day (QID) | RESPIRATORY_TRACT | Status: DC
  Administered 2012-10-17 – 2012-10-18 (×2): 2.5 mg via RESPIRATORY_TRACT
  Filled 2012-10-17 (×2): qty 0.5

## 2012-10-17 NOTE — ED Provider Notes (Signed)
History     CSN: [id number]  Arrival date & time 10/17/12  1415   First MD Initiated Contact with Patient 10/17/12 1441     Chief Complaint  Patient presents with  . Seizures    HPI Comments: Pt is a 79 yo M with PMH of dementia, seizures (on Depakote and Keppra), HTN who was brought to ED via EMS from SNF after a witnessed seizure. He received 2mg  Ativan en route to ED.  Level V caveat applies due to mental status.  Patient is a 79 y.o. male presenting with seizures.  Seizures  This is a recurrent problem. The current episode started less than 1 hour ago. The problem has been resolved. There was 1 seizure. Duration: unknown, likely less than 10 minutes. Characteristics include rhythmic jerking. The episode was witnessed. The seizures did not continue in the ED. Recently treated UTI with Keflex is only medication change. Has not missed his AED doses There has been no fever.    Past Medical History  Diagnosis Date  . Glaucoma(365)   . Anxiety   . GERD (gastroesophageal reflux disease)   . Hyperlipidemia   . Hypertension   . Chronic abdominal pain   . 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 Chronic respiratory failure   . Altered mental status 06/12/2012; 08/14/2012    Unwitnessed isolated seizure suspected, but because of the EEG being normal, Dr. Gerilyn Pilgrim did not recommend antiseizure therapy.; "violent since 08/13/2012" (08/14/2012)  . Hard of hearing   . Seizure 06/14/2012    Isolated seizure suspected, but not confirmed.  . Bradycardia   . Complication of anesthesia 2005    "couldn't get him woke up when he should have after eye surgery" (08/14/2012)  . Mesothelioma   . Pleural plaque 04/2012  . Exertional dyspnea   . DM type 2 (diabetes mellitus, type 2) 06/13/2012  . UTI (lower urinary tract infection)     Past Surgical History  Procedure Date  . Inguinal hernia repair   . Prostate surgery   . Left eye surgery for glaucoma at [facility] 10/2009  .  Cataract extraction w/ intraocular lens implant 2005    "left" (08/14/2012)    Family History  Problem Relation Age of Onset  . Lung cancer Sister     History  Substance Use Topics  . Smoking status: Former Smoker -- 2.0 packs/day for 40 years    Types: Cigarettes  . Smokeless tobacco: Never Used     Comment: "quit smoking cigarettes ~ 2001"  . Alcohol Use: Yes     Comment: 08/14/2012 "used to drink heavily; stopped ~ 2001"    Review of Systems  Unable to perform ROS: Mental status change  Neurological: Positive for seizures.    Allergies  Review of patient's allergies indicates no known allergies.  Home Medications   Current Outpatient Rx  Name  Route  Sig  Dispense  Refill  . ALBUTEROL SULFATE (2.5 MG/3ML) 0.083% IN NEBU   Nebulization   Take 2.5 mg by nebulization 4 (four) times daily.         Marland Kitchen ALPRAZOLAM 0.25 MG PO TABS   Oral   Take 0.25 mg by mouth 2 (two) times daily.         Marland Kitchen AMLODIPINE BESYLATE 5 MG PO TABS   Oral   Take 1 tablet (5 mg total) by mouth daily.   30 tablet   0   . BRIMONIDINE TARTRATE 0.1 % OP SOLN  Both Eyes   Place 1 drop into both eyes at bedtime.          . CEPHALEXIN 500 MG PO CAPS   Oral   Take 1 capsule (500 mg total) by mouth 3 (three) times daily.   30 capsule   0   . DIVALPROEX SODIUM 500 MG PO TBEC   Oral   Take 500 mg by mouth 3 (three) times daily.         . DONEPEZIL HCL 10 MG PO TABS   Oral   Take 10 mg by mouth at bedtime.         . DORZOLAMIDE HCL-TIMOLOL MAL PF 22.3-6.8 MG/ML OP SOLN   Ophthalmic   Apply 1 drop to eye 2 (two) times daily.          . RESOURCE BREEZE PO LIQD   Oral   Take 1 Container by mouth 2 (two) times daily between meals.         Marland Kitchen LATANOPROST 0.005 % OP SOLN   Left Eye   Place 1 drop into the left eye at bedtime.         Marland Kitchen LEVETIRACETAM 250 MG PO TABS   Oral   Take 1 tablet (250 mg total) by mouth every 12 (twelve) hours.   60 tablet   0   . LOVASTATIN 40 MG  PO TABS   Oral   Take 40 mg by mouth at bedtime.           Marland Kitchen METFORMIN HCL 500 MG PO TABS   Oral   Take 1 tablet (500 mg total) by mouth 2 (two) times daily with a meal.   60 tablet   11   . MONTELUKAST SODIUM 10 MG PO TABS   Oral   Take 10 mg by mouth at bedtime.           . MULTI-VITAMIN/MINERALS PO TABS   Oral   Take 1 tablet by mouth daily.   30 tablet   6   . OMEPRAZOLE 20 MG PO CPDR   Oral   Take 20 mg by mouth daily.         Marland Kitchen PILOCARPINE HCL 1 % OP SOLN   Both Eyes   Place 1 drop into both eyes Daily.          Marland Kitchen POLYETHYLENE GLYCOL 3350 PO PACK   Oral   Take 17 g by mouth daily.         Bernadette Hoit SODIUM 8.6-50 MG PO TABS   Oral   Take 1 tablet by mouth 2 (two) times daily.         Marland Kitchen SIMETHICONE 80 MG PO CHEW   Oral   Chew 1 tablet (80 mg total) by mouth 4 (four) times daily as needed for flatulence.   30 tablet   0     BP 117/74  Pulse 65  Temp 97.4 F (36.3 C) (Oral)  Resp 16  SpO2 100%  Physical Exam  Vitals reviewed. Constitutional: He appears well-developed. He appears lethargic. No distress.       On initial exam, patient is snoring. Will arouse some with tactile stimulation but does not open eyes. (Followed commands to open mouth only.) On subsequent exam, patient does not open eyes or mouth. Mild grimace to sternal rub. Family members not able to get him to open eyes either.  HENT:  Head: Normocephalic and atraumatic.  Mouth/Throat: Oropharynx is clear and moist.  Eyes: Conjunctivae  normal are normal. Pupils are equal, round, and reactive to light.  Cardiovascular: Normal rate, regular rhythm and normal heart sounds.   No murmur heard. Pulmonary/Chest: Effort normal. He has rales (Cleared with cough).  Abdominal: Soft. There is no tenderness.  Musculoskeletal: He exhibits no edema.  Neurological: He appears lethargic.  Skin: He is not diaphoretic.    ED Course  Procedures (including critical care  time)  Labs Reviewed  CBC WITH DIFFERENTIAL - Abnormal; Notable for the following:    Hemoglobin 12.1 (*)     All other components within normal limits  COMPREHENSIVE METABOLIC PANEL - Abnormal; Notable for the following:    Calcium 10.6 (*)     GFR calc non Af Amer 68 (*)     GFR calc Af Amer 79 (*)     All other components within normal limits  GLUCOSE, CAPILLARY - Abnormal; Notable for the following:    Glucose-Capillary 69 (*)     All other components within normal limits  VALPROIC ACID LEVEL   Ct Head Wo Contrast  10/17/2012  *RADIOLOGY REPORT*  Clinical Data:  History of seizures and dementia and hypertension.  CT HEAD WITHOUT CONTRAST  Technique: Contiguous axial images were obtained from the base of the skull through the vertex without contrast  Comparison:  10/10/2012.  Findings:  There is no evidence of brain mass, brain hemorrhage, or acute infarction.  The ventricular system is is stable size and shape. No evidence of obstructive hydrocephalus is seen.  There is no evidence of shift of midline structures, parenchymal lesion, or subdural or epidural hematoma. Chronic atrophic changes seen with prominence of cortical sulci and fissures.  The calvarium is intact.  Mastoids are well aerated.  No sinusitis is evident. Right orbital postsurgical changes appear stable.  IMPRESSION: There is no evidence of brain mass, brain hemorrhage, or acute infarction. Cerebral atrophic changes appear stable.  No acute or active process is seen. Chronic findings are described above and appear stable.  No skull lesion is evident.  No sinusitis is evident.   Original Report Authenticated By: Onalee Hua Call     [phone]- Patient seen and examined with representative from SNF. She did not witness the seizure activity, but we confirmed he had taken all AED medications. Awaiting labs to return.  1630- Patient less responsive than before. Family now at bedside and endorse the same. CBG 69. Will start D5NS @50cc /hr. CT  head ordered. Family agrees with plan. 1730- CT head wnl, patient continues to be minimally responsive. Will continue to observe to see if this could be due to Ativan. 1830- Patient still not responsive. Given the prolonged time, will admit to hospital for closer observation.  1. Seizure   2. Postictal state     MDM  79 yo M with PMH of seizure, HTN, HLD and dementia presenting to ED s/p witnessed seizure at [facility], now with prolonged post-ictal state.   Date: 10/17/2012  Rate: 77  Rhythm: normal sinus rhythm  QRS Axis: normal  Intervals: normal  ST/T Wave abnormalities: ST elevation in V4-V5, present on 1/31  Conduction Disutrbances: none  Narrative Interpretation: No STEMI  Old EKG Reviewed: No significant changes noted  Spoke with [facility] who will admit patient for observation. Family agrees with plan.    Hilarie Fredrickson, MD 10/17/12 [phone]

## 2012-10-17 NOTE — H&P (Signed)
[facility] History and Physical  [name] [name]  ZOX:[id number]  DOB: [birth year]   DOA: 10/17/2012   PCP:   Pearson Grippe, MD   Chief Complaint:  Unresponsive since this afternoon HPI: [name] [name] is an 79 y.o. male.  Elderly African American man, resident of [facility] skilled nursing facility, with advanced dementia, extensive investigations over the past few months for recurrent seizures with prolonged postictal period, eventually had improvement when referred to a psychiatrist at [facility] and was started on Depakote. The patient follows with Dr. Gerilyn Pilgrim, and was last seen by him this morning.  Medications include Depakote and Keppra. the Depakote dose was recently increased.  The patient reportedly had a witnessed seizure episode at nursing home, EMS was called and patient was found unresponsive. There is a report that he was given Ativan 2 mg en route to the emergency room, but it is unclear if this is because he had another seizure en route. In any event since being in the emergency room over 5 hours patient has remained responsive only to deep pain semipurposeful movements; he keeps his eyes closed. His blood sugar was found to be 69 and dextrose was added to his IV fluids.  At baseline patient has advanced dementia, does not interact meaningfully with family members, is mostly bed or wheelchair confined, but is able to ambulate short distances with assistance.  Family are concerned by his continued unresponsiveness.  There is no history of fever or trauma.   Currently CT scan of the head shows no acute findings,  it shows chronic atrophic changes, and previous MRIs of only shown similar atrophy.   Patient is severely hearing impaired and blind in both eyes according to her  Rewiew of Systems:  Unable to obtain because of patient's mental status    Past Medical History  Diagnosis Date  . Glaucoma(365)   . Anxiety   . GERD (gastroesophageal reflux disease)   .  Hyperlipidemia   . Hypertension   . Chronic abdominal pain   . 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 Chronic respiratory failure   . Altered mental status 06/12/2012; 08/14/2012    Unwitnessed isolated seizure suspected, but because of the EEG being normal, Dr. Gerilyn Pilgrim did not recommend antiseizure therapy.; "violent since 08/13/2012" (08/14/2012)  . Hard of hearing   . Seizure 06/14/2012    Isolated seizure suspected, but not confirmed.  . Bradycardia   . Complication of anesthesia 2005    "couldn't get him woke up when he should have after eye surgery" (08/14/2012)  . Mesothelioma   . Pleural plaque 04/2012  . Exertional dyspnea   . DM type 2 (diabetes mellitus, type 2) 06/13/2012  . UTI (lower urinary tract infection)     Past Surgical History  Procedure Date  . Inguinal hernia repair   . Prostate surgery   . Left eye surgery for glaucoma at [facility] 10/2009  . Cataract extraction w/ intraocular lens implant 2005    "left" (08/14/2012)    Medications:  HOME MEDS: Prior to Admission medications   Medication Sig Start Date End Date Taking? Authorizing Provider  albuterol (PROVENTIL) (2.5 MG/3ML) 0.083% nebulizer solution Take 2.5 mg by nebulization 4 (four) times daily. 08/26/12  Yes Elease Etienne, MD  ALPRAZolam Prudy Feeler) 0.25 MG tablet Take 0.25 mg by mouth 2 (two) times daily.   Yes Historical Provider, MD  amLODipine (NORVASC) 5 MG tablet Take 1 tablet (5 mg total) by mouth daily. 08/26/12  Yes Elease Etienne, MD  brimonidine (ALPHAGAN P) 0.1 % SOLN Place 1 drop into both eyes at bedtime.    Yes Historical Provider, MD  cephALEXin (KEFLEX) 500 MG capsule Take 1 capsule (500 mg total) by mouth 3 (three) times daily. 10/10/12  Yes Ward Givens, MD  divalproex (DEPAKOTE) 500 MG DR tablet Take 500 mg by mouth 3 (three) times daily. 08/26/12  Yes Elease Etienne, MD  donepezil (ARICEPT) 10 MG tablet Take 10 mg by mouth at bedtime.   Yes Historical Provider, MD   Dorzolamide HCl-Timolol Mal PF 22.3-6.8 MG/ML SOLN Apply 1 drop to eye 2 (two) times daily.    Yes Historical Provider, MD  feeding supplement (RESOURCE BREEZE) LIQD Take 1 Container by mouth 2 (two) times daily between meals.   Yes Historical Provider, MD  latanoprost (XALATAN) 0.005 % ophthalmic solution Place 1 drop into the left eye at bedtime.   Yes Historical Provider, MD  levETIRAcetam (KEPPRA) 250 MG tablet Take 1 tablet (250 mg total) by mouth every 12 (twelve) hours. 10/10/12  Yes Ward Givens, MD  lovastatin (MEVACOR) 40 MG tablet Take 40 mg by mouth at bedtime.     Yes Historical Provider, MD  metFORMIN (GLUCOPHAGE) 500 MG tablet Take 1 tablet (500 mg total) by mouth 2 (two) times daily with a meal. 04/16/12 04/16/13 Yes Kerri Perches, MD  montelukast (SINGULAIR) 10 MG tablet Take 10 mg by mouth at bedtime.     Yes Historical Provider, MD  Multiple Vitamins-Minerals (MULTIVITAMIN WITH MINERALS) tablet Take 1 tablet by mouth daily. 06/14/12  Yes Elliot Cousin, MD  omeprazole (PRILOSEC) 20 MG capsule Take 20 mg by mouth daily.   Yes Historical Provider, MD  pilocarpine (PILOCAR) 1 % ophthalmic solution Place 1 drop into both eyes Daily.  07/14/12  Yes Historical Provider, MD  polyethylene glycol (MIRALAX / GLYCOLAX) packet Take 17 g by mouth daily. 08/26/12  Yes Elease Etienne, MD  senna-docusate (DOC-Q-LAX) 8.6-50 MG per tablet Take 1 tablet by mouth 2 (two) times daily.   Yes Historical Provider, MD  simethicone (MYLICON) 80 MG chewable tablet Chew 1 tablet (80 mg total) by mouth 4 (four) times daily as needed for flatulence. 08/26/12   Elease Etienne, MD     Allergies:  No Known Allergies  Social History:   reports that he has quit smoking. His smoking use included Cigarettes. He has a 80 pack-year smoking history. He has never used smokeless tobacco. He reports that he drinks alcohol. He reports that he does not use illicit drugs.  Family History: Family History  Problem  Relation Age of Onset  . Lung cancer Sister   . Breast cancer Sister      Physical Exam: Filed Vitals:   10/17/12 1507 10/17/12 1714 10/17/12 2022  BP: 108/80 117/74 119/77  Pulse: 89 65 71  Temp: 97.4 F (36.3 C)    TempSrc: Oral    Resp: 21 16 17   SpO2: 94% 100% 97%   Blood pressure 119/77, pulse 71, temperature 97.4 F (36.3 C), temperature source Oral, resp. rate 17, SpO2 97.00%.  GEN:  Lethargic elderly African American gentleman lying in the stretcher in no acute distress; unable to be cooperative with exam PSYCH:  Does not respond to voice; response to deep pain by attempting to turn; is tending to want to lay on his side, in fetal position.  HEENT: Mucous membranes pink, dry and anicteric; no cervical lymphadenopathy  no JVD; Breasts:: Not  examined CHEST WALL: No tenderness CHEST: Normal respiration, fine bibasilar HEART: Regular rate and rhythm; no murmurs rubs or gallops BACK: No kyphosis or scoliosis; no CVA tenderness ABDOMEN: Obese, soft non-tender; no masses, no organomegaly, normal abdominal bowel sounds; no pannus; no intertriginous candida. Rectal Exam: Not done EXTREMITIES: No bone or joint deformity; age-appropriate arthropathy of the hands and knees; no edema; no ulcerations. Genitalia: not examined PULSES: 2+ and symmetric SKIN: Normal hydration no rash or ulceration CNS: Cranial nerves 3-12 grossly intact, except extremely hard of hearing, does not respond to voice; no focal lateralizing neurologic deficit   Labs on Admission:  Basic Metabolic Panel:  Lab 10/17/12 4098  NA 138  K 4.4  CL 101  CO2 28  GLUCOSE 83  BUN 17  CREATININE 1.02  CALCIUM 10.6*  MG --  PHOS --   Liver Function Tests:  Lab 10/17/12 1427  AST 13  ALT 12  ALKPHOS 65  BILITOT 0.3  PROT 8.2  ALBUMIN 3.6   No results found for this basename: LIPASE:5,AMYLASE:5 in the last 168 hours No results found for this basename: AMMONIA:5 in the last 168 hours CBC:  Lab  10/17/12 1427  WBC 6.8  NEUTROABS 4.4  HGB 12.1*  HCT 39.1  MCV 88.9  PLT 269   Cardiac Enzymes: No results found for this basename: CKTOTAL:5,CKMB:5,CKMBINDEX:5,TROPONINI:5 in the last 168 hours BNP: No components found with this basename: POCBNP:5 D-dimer: No components found with this basename: D-DIMER:5 CBG:  Lab 10/17/12 1843 10/17/12 1631  GLUCAP 81 69*    Radiological Exams on Admission: Ct Head Wo Contrast  10/17/2012  *RADIOLOGY REPORT*  Clinical Data:  History of seizures and dementia and hypertension.  CT HEAD WITHOUT CONTRAST  Technique: Contiguous axial images were obtained from the base of the skull through the vertex without contrast  Comparison:  10/10/2012.  Findings:  There is no evidence of brain mass, brain hemorrhage, or acute infarction.  The ventricular system is is stable size and shape. No evidence of obstructive hydrocephalus is seen.  There is no evidence of shift of midline structures, parenchymal lesion, or subdural or epidural hematoma. Chronic atrophic changes seen with prominence of cortical sulci and fissures.  The calvarium is intact.  Mastoids are well aerated.  No sinusitis is evident. Right orbital postsurgical changes appear stable.  IMPRESSION: There is no evidence of brain mass, brain hemorrhage, or acute infarction. Cerebral atrophic changes appear stable.  No acute or active process is seen. Chronic findings are described above and appear stable.  No skull lesion is evident.  No sinusitis is evident.   Original Report Authenticated By: Onalee Hua Call    Dg Chest Port 1 View  10/17/2012  *RADIOLOGY REPORT*  Clinical Data: Seizure, unresponsive  PORTABLE CHEST - 1 VIEW  Comparison: Portable chest x-ray of 10/10/2012 and CT chest of 04/16/2012  Findings: No active infiltrate or effusion is seen.  Vague nodular opacities left greater than right are consistent with pleural plaques, some of which are calcified, as noted on prior CT.  The heart is within upper  limits normal.  No acute skeletal abnormality is seen.  IMPRESSION: No active lung disease.  Pleural plaques left greater than right.   Original Report Authenticated By: Dwyane Dee, M.D.     Assessment/Plan Present on Admission:  . Altered mental status  Probably due to her prolonged postictal state; possible other pathology we'll observe him overnight; convert medications to IV until he becomes more alert . Seizure, breakthrough  We'll increase the dose of his Keppra  . Post-ictal state  Prolonged, likely due to his severe advanced dementia; will observe overnight per family's wishes . 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  . Anemia  Currently masked by dehydration; no further investigations warranted for this  . Diabetes  He is usually maintained on metformin, but is likely mildly hypoglycemic because of seizure; will discontinue his metformin for the time being and continue dextrose-saline IV fluids, will discontinue dextrose if blood sugar goes above 200.  Marland Kitchen GLAUCOMA  Continue glaucoma management   DO NOT RESUSCITATE status confirmed    no indication to transfer for neurological assessment at this time We'll check troponins because of abnormal EKG, but prolonged altered mental status is not typical of acute MI   Other plans as per orders.  Code Status:DNR Family Communication: 3 of his daughters present at bedside for interview physical examination and discussion of plans  Disposition Plan: likely back to of onset he becomes more alert and more    Hobert Poplaski Nocturnist [facility] Pager [phone]   10/17/2012, 8:53 PM

## 2012-10-17 NOTE — ED Notes (Addendum)
Pt from [facility]. ems called out for witness seizure. Pt was still seizing upon ems arrival. ems states pt was very rigid and unresponsive. 2mg  ativan iv given en route. Pt arrived alert/oriented to some. Sleepy, slightbgly post ictal. cbg en route 116. Pt had opa to right nare and on nonrebreather upon arrival. opa removed and pt on ra at this time. Pt follows commands. Seizure pads placed. Denies pain. Pt HOH. No peripheral swelling noted.

## 2012-10-17 NOTE — ED Provider Notes (Signed)
I have personally seen and examined the patient.  I have discussed the plan of care with the resident.  I have reviewed the documentation on PMH/FH/Soc. History.  I have reviewed the documentation of the resident and agree.  I have reviewed and agree with the ECG interpretation(s) documented by the resident.  Checked on patient frequently and he did not get back to his baseline. For this reason, plan is to admit for observation  Joya Gaskins, MD 10/17/12 [phone]

## 2012-10-18 ENCOUNTER — Other Ambulatory Visit: Payer: Self-pay

## 2012-10-18 LAB — BASIC METABOLIC PANEL
BUN: 13 mg/dL (ref 6–23)
CO2: 32 mEq/L (ref 19–32)
Calcium: 9.9 mg/dL (ref 8.4–10.5)
Chloride: 105 mEq/L (ref 96–112)
Creatinine, Ser: 1.06 mg/dL (ref 0.50–1.35)
Glucose, Bld: 97 mg/dL (ref 70–99)

## 2012-10-18 LAB — CBC
HCT: 37 % — ABNORMAL LOW (ref 39.0–52.0)
MCH: 27.1 pg (ref 26.0–34.0)
MCHC: 29.7 g/dL — ABNORMAL LOW (ref 30.0–36.0)
MCV: 91.1 fL (ref 78.0–100.0)
Platelets: 269 10*3/uL (ref 150–400)
RDW: 15.4 % (ref 11.5–15.5)
WBC: 6 10*3/uL (ref 4.0–10.5)

## 2012-10-18 LAB — GLUCOSE, CAPILLARY
Glucose-Capillary: 137 mg/dL — ABNORMAL HIGH (ref 70–99)
Glucose-Capillary: 78 mg/dL (ref 70–99)
Glucose-Capillary: 53 mg/dL — ABNORMAL LOW (ref 70–99)

## 2012-10-18 LAB — URINALYSIS, ROUTINE W REFLEX MICROSCOPIC
Bilirubin Urine: NEGATIVE
Nitrite: NEGATIVE
pH: 6 (ref 5.0–8.0)

## 2012-10-18 MED ORDER — GLUCOSE 40 % PO GEL
ORAL | Status: AC
  Administered 2012-10-18: 37.5 g
  Filled 2012-10-18: qty 0.83

## 2012-10-18 NOTE — Progress Notes (Signed)
Hypoglycemic Event  CBG: 53  Treatment: 15 GM carbohydrate snack  Symptoms: lethargic but responsive   Follow-up CBG: Time:1210 CBG Result:54  Possible Reasons for Event: Unknown  Comments/MD notified:Given more OJ and glucose gel. Patients CBG up to 77 @~1230. MD aware.    Blima Rich  Remember to initiate Hypoglycemia Order Set & complete

## 2012-10-18 NOTE — Progress Notes (Signed)
Patient left with EMS to [facility]. Report called to nurse receiving patient. Dr. Karilyn Cota aware of patients drop in blood sugar but patient responsive and taking POs. Ok to return to SNF. Nurse at [facility] aware in pts drop as well. CBG 77 before departure. Daughter [name] [name] Fail at bedside aware of CBG and transfer.

## 2012-10-18 NOTE — Discharge Summary (Signed)
Physician Discharge Summary  [name] [name] QMV:[id number] DOB: 1931-02-22 DOA: 10/17/2012  PCP: Pearson Grippe, MD  Admit date: 10/17/2012 Discharge date: 10/18/2012  Time spent: Greater than 30 minutes  Recommendations for Outpatient Follow-up:  1. Follow with Dr Gerilyn Pilgrim, neurology for adjustments of anticonvulsants as required.  Discharge Diagnoses:  1. Seizure with prolonged postictal phase. History of seizure disorder. 2. Moderate to severe dementia with delusion. 3. Type 2 diabetes mellitus. 4. Recent UTI, improved by urinalysis.    Discharge Condition: Stable and improved.  Diet recommendation: Regular.  Filed Weights   10/17/12 2100 10/18/12 0500 10/18/12 0540  Weight: 71.2 kg (156 lb 15.5 oz) 73.9 kg (162 lb 14.7 oz) 74.1 kg (163 lb 5.8 oz)    History of present illness:  This 79 year old man was admitted yesterday with unresponsiveness following a seizure. Please see initial history as outlined below: [name] [name] is an 79 y.o. male. Elderly African American man, resident of [facility] skilled nursing facility, with advanced dementia, extensive investigations over the past few months for recurrent seizures with prolonged postictal period, eventually had improvement when referred to a psychiatrist at [facility] and was started on Depakote. The patient follows with Dr. Gerilyn Pilgrim, and was last seen by him this morning. Medications include Depakote and Keppra. the Depakote dose was recently increased.  The patient reportedly had a witnessed seizure episode at nursing home, EMS was called and patient was found unresponsive. There is a report that he was given Ativan 2 mg en route to the emergency room, but it is unclear if this is because he had another seizure en route. In any event since being in the emergency room over 5 hours patient has remained responsive only to deep pain semipurposeful movements; he keeps his eyes closed. His blood sugar was found to be 69 and dextrose was  added to his IV fluids.  At baseline patient has advanced dementia, does not interact meaningfully with family members, is mostly bed or wheelchair confined, but is able to ambulate short distances with assistance.  Family are concerned by his continued unresponsiveness.  There is no history of fever or trauma.  Currently CT scan of the head shows no acute findings, it shows chronic atrophic changes, and previous MRIs of only shown similar atrophy.  Patient is severely hearing impaired and blind in both eyes according to her  Hospital Course:  Patient was admitted overnight and observe. This morning he is back to his usual self and has had no further seizures. He was placed on intravenous medications and anticonvulsants as he was n.p.o. overnight. His lab work is unremarkable and urinalysis does not show any evidence of UTI now. His valproic acid level was in normal range of 52. He did have an UTI recently. He is now stable to be discharged back to the nursing home. I had a long discussion with patient's daughter regarding his dementia and pathophysiology and prognosis going forward. She understands and appreciates that his dementia is likely worsening. He'll be sent back to the nursing home with his oral anticonvulsant medication doses.  Procedures:  None.  Consultations:  None.  Discharge Exam: Filed Vitals:   10/17/12 2251 10/18/12 0500 10/18/12 0540 10/18/12 0705  BP:   113/61   Pulse:   62   Temp:   97.9 F (36.6 C)   TempSrc:   Oral   Resp:   16   Height:      Weight:  73.9 kg (162 lb 14.7 oz) 74.1 kg (  163 lb 5.8 oz)   SpO2: 98%  100% 92%    General: He looks systemically well. He is very alert at this time. Is not toxic or septic clinically. Cardiovascular: Heart sounds are present without murmurs or gallop rhythm. Respiratory: Lung fields are clear. There are no obvious focal neurological signs.  Discharge Instructions  Discharge Orders   Future Appointments Provider  Department Dept Phone   11/21/2012 10:30 AM Kerri Perches, MD [facility] [phone]   Future Orders Complete By Expires     Diet - low sodium heart healthy  As directed     Increase activity slowly  As directed         Medication List    STOP taking these medications       cephALEXin 500 MG capsule  Commonly known as:  KEFLEX      TAKE these medications       albuterol (2.5 MG/3ML) 0.083% nebulizer solution  Commonly known as:  PROVENTIL  Take 2.5 mg by nebulization 4 (four) times daily.     ALPHAGAN P 0.1 % Soln  Generic drug:  brimonidine  Place 1 drop into both eyes at bedtime.     ALPRAZolam 0.25 MG tablet  Commonly known as:  XANAX  Take 0.25 mg by mouth 2 (two) times daily.     amLODipine 5 MG tablet  Commonly known as:  NORVASC  Take 1 tablet (5 mg total) by mouth daily.     divalproex 500 MG DR tablet  Commonly known as:  DEPAKOTE  Take 500 mg by mouth 3 (three) times daily.     DOC-Q-LAX 8.6-50 MG per tablet  Generic drug:  senna-docusate  Take 1 tablet by mouth 2 (two) times daily.     donepezil 10 MG tablet  Commonly known as:  ARICEPT  Take 10 mg by mouth at bedtime.     Dorzolamide HCl-Timolol Mal PF 22.3-6.8 MG/ML Soln  Apply 1 drop to eye 2 (two) times daily.     feeding supplement Liqd  Take 1 Container by mouth 2 (two) times daily between meals.     latanoprost 0.005 % ophthalmic solution  Commonly known as:  XALATAN  Place 1 drop into the left eye at bedtime.     levETIRAcetam 250 MG tablet  Commonly known as:  KEPPRA  Take 1 tablet (250 mg total) by mouth every 12 (twelve) hours.     lovastatin 40 MG tablet  Commonly known as:  MEVACOR  Take 40 mg by mouth at bedtime.     metFORMIN 500 MG tablet  Commonly known as:  GLUCOPHAGE  Take 1 tablet (500 mg total) by mouth 2 (two) times daily with a meal.     multivitamin with minerals tablet  Take 1 tablet by mouth daily.     omeprazole 20 MG capsule  Commonly  known as:  PRILOSEC  Take 20 mg by mouth daily.     pilocarpine 1 % ophthalmic solution  Commonly known as:  PILOCAR  Place 1 drop into both eyes Daily.     polyethylene glycol packet  Commonly known as:  MIRALAX / GLYCOLAX  Take 17 g by mouth daily.     simethicone 80 MG chewable tablet  Commonly known as:  MYLICON  Chew 1 tablet (80 mg total) by mouth 4 (four) times daily as needed for flatulence.     SINGULAIR 10 MG tablet  Generic drug:  montelukast  Take 10 mg by mouth  at bedtime.          The results of significant diagnostics from this hospitalization (including imaging, microbiology, ancillary and laboratory) are listed below for reference.    Significant Diagnostic Studies: Ct Head Wo Contrast  10/17/2012  *RADIOLOGY REPORT*  Clinical Data:  History of seizures and dementia and hypertension.  CT HEAD WITHOUT CONTRAST  Technique: Contiguous axial images were obtained from the base of the skull through the vertex without contrast  Comparison:  10/10/2012.  Findings:  There is no evidence of brain mass, brain hemorrhage, or acute infarction.  The ventricular system is is stable size and shape. No evidence of obstructive hydrocephalus is seen.  There is no evidence of shift of midline structures, parenchymal lesion, or subdural or epidural hematoma. Chronic atrophic changes seen with prominence of cortical sulci and fissures.  The calvarium is intact.  Mastoids are well aerated.  No sinusitis is evident. Right orbital postsurgical changes appear stable.  IMPRESSION: There is no evidence of brain mass, brain hemorrhage, or acute infarction. Cerebral atrophic changes appear stable.  No acute or active process is seen. Chronic findings are described above and appear stable.  No skull lesion is evident.  No sinusitis is evident.   Original Report Authenticated By: Onalee Hua Call    Ct Head Wo Contrast  10/10/2012  *RADIOLOGY REPORT*  Clinical Data: Altered mental status  CT HEAD WITHOUT  CONTRAST  Technique:  Contiguous axial images were obtained from the base of the skull through the vertex without contrast.  Comparison: 08/14/2012.  Findings: No skull fracture is noted.  Paranasal sinuses and mastoid air cells are unremarkable.  No intracranial hemorrhage, mass effect or midline shift.  Stable cerebral atrophy.  Stable periventricular chronic mild white matter disease.  No acute infarction.  No mass lesion is noted on this unenhanced scan.  Ventricular size is stable from prior exam. Stable postsurgical changes right eye.  IMPRESSION: No acute intracranial abnormality.  Stable cerebral atrophy and chronic periventricular mild white matter disease.   Original Report Authenticated By: Natasha Mead, M.D.    Dg Chest Port 1 View  10/17/2012  *RADIOLOGY REPORT*  Clinical Data: Seizure, unresponsive  PORTABLE CHEST - 1 VIEW  Comparison: Portable chest x-ray of 10/10/2012 and CT chest of 04/16/2012  Findings: No active infiltrate or effusion is seen.  Vague nodular opacities left greater than right are consistent with pleural plaques, some of which are calcified, as noted on prior CT.  The heart is within upper limits normal.  No acute skeletal abnormality is seen.  IMPRESSION: No active lung disease.  Pleural plaques left greater than right.   Original Report Authenticated By: Dwyane Dee, M.D.    Dg Chest Portable 1 View  10/10/2012  *RADIOLOGY REPORT*  Clinical Data: Altered mental status.  PORTABLE CHEST - 1 VIEW  Comparison: 08/18/2012  Findings: Heart size and pulmonary vascularity are normal.  No infiltrates or effusions.  Multiple pleural based nodules are noted bilaterally, unchanged.  Extensive emphysematous disease.  No acute osseous abnormality.  IMPRESSION: No acute abnormality.  Emphysema.  Multiple pleural nodules, most likely secondary to asbestos exposure.   Original Report Authenticated By: Francene Boyers, M.D.     Microbiology: Recent Results (from the past 240 hour(s))  CULTURE,  BLOOD (SINGLE)     Status: None   Collection Time    10/10/12  6:20 AM      Result Value Range Status   Specimen Description BLOOD LEFT ARM   Final  Special Requests BOTTLES DRAWN AEROBIC AND ANAEROBIC 6CC   Final   Culture NO GROWTH 5 DAYS   Final   Report Status 10/15/2012 FINAL   Final  URINE CULTURE     Status: None   Collection Time    10/10/12 10:12 AM      Result Value Range Status   Specimen Description URINE, CATHETERIZED   Final   Special Requests NONE   Final   Culture  Setup Time 10/11/2012 02:04   Final   Colony Count >=100,000 COLONIES/ML   Final   Culture ESCHERICHIA COLI   Final   Report Status 10/12/2012 FINAL   Final   Organism ID, Bacteria ESCHERICHIA COLI   Final     Labs: Basic Metabolic Panel:  Recent Labs Lab 10/17/12 1427 10/18/12 0608  NA 138 143  K 4.4 4.6  CL 101 105  CO2 28 32  GLUCOSE 83 97  BUN 17 13  CREATININE 1.02 1.06  CALCIUM 10.6* 9.9   Liver Function Tests:  Recent Labs Lab 10/17/12 1427  AST 13  ALT 12  ALKPHOS 65  BILITOT 0.3  PROT 8.2  ALBUMIN 3.6     CBC:  Recent Labs Lab 10/17/12 1427 10/18/12 0608  WBC 6.8 6.0  NEUTROABS 4.4  --   HGB 12.1* 11.0*  HCT 39.1 37.0*  MCV 88.9 91.1  PLT 269 269   Cardiac Enzymes:  Recent Labs Lab 10/18/12 0646  TROPONINI <0.30   BNP: BNP (last 3 results) No results found for this basename: PROBNP,  in the last 8760 hours CBG:  Recent Labs Lab 10/17/12 1631 10/17/12 1843 10/18/12 0012 10/18/12 0555 10/18/12 0809  GLUCAP 69* 81 137* 82 78       Signed:  Lilyahna Sirmon C  [facility] 10/18/2012, 8:37 AM

## 2012-10-19 LAB — TSH: TSH: 1.234 u[IU]/mL (ref 0.350–4.500)

## 2012-10-20 LAB — GLUCOSE, CAPILLARY
Glucose-Capillary: 54 mg/dL — ABNORMAL LOW (ref 70–99)
Glucose-Capillary: 77 mg/dL (ref 70–99)

## 2012-11-07 NOTE — Progress Notes (Signed)
UR Chart Review Completed  

## 2012-11-21 ENCOUNTER — Ambulatory Visit: Payer: PRIVATE HEALTH INSURANCE | Admitting: Family Medicine

## 2012-12-09 ENCOUNTER — Ambulatory Visit: Payer: PRIVATE HEALTH INSURANCE | Admitting: Family Medicine

## 2013-04-28 ENCOUNTER — Inpatient Hospital Stay (HOSPITAL_COMMUNITY)
Admission: AD | Admit: 2013-04-28 | Discharge: 2013-05-01 | DRG: 872 | Disposition: A | Discharge status: Skilled Nursing Facility | Payer: PRIVATE HEALTH INSURANCE | Attending: Family Medicine | Admitting: Family Medicine

## 2013-04-28 ENCOUNTER — Encounter (HOSPITAL_COMMUNITY): Payer: Self-pay

## 2013-04-28 ENCOUNTER — Emergency Department (HOSPITAL_COMMUNITY): Payer: PRIVATE HEALTH INSURANCE

## 2013-04-28 DIAGNOSIS — R82998 Other abnormal findings in urine: Secondary | ICD-10-CM

## 2013-04-28 DIAGNOSIS — E860 Dehydration: Secondary | ICD-10-CM | POA: Diagnosis present

## 2013-04-28 DIAGNOSIS — R509 Fever, unspecified: Secondary | ICD-10-CM

## 2013-04-28 DIAGNOSIS — J9610 Chronic respiratory failure, unspecified whether with hypoxia or hypercapnia: Secondary | ICD-10-CM

## 2013-04-28 DIAGNOSIS — R109 Unspecified abdominal pain: Secondary | ICD-10-CM

## 2013-04-28 DIAGNOSIS — E785 Hyperlipidemia, unspecified: Secondary | ICD-10-CM | POA: Diagnosis present

## 2013-04-28 DIAGNOSIS — Z9981 Dependence on supplemental oxygen: Secondary | ICD-10-CM

## 2013-04-28 DIAGNOSIS — K219 Gastro-esophageal reflux disease without esophagitis: Secondary | ICD-10-CM | POA: Diagnosis present

## 2013-04-28 DIAGNOSIS — R8290 Unspecified abnormal findings in urine: Secondary | ICD-10-CM | POA: Diagnosis present

## 2013-04-28 DIAGNOSIS — F0393 Unspecified dementia, unspecified severity, with mood disturbance: Secondary | ICD-10-CM | POA: Diagnosis present

## 2013-04-28 DIAGNOSIS — Z79899 Other long term (current) drug therapy: Secondary | ICD-10-CM

## 2013-04-28 DIAGNOSIS — J449 Chronic obstructive pulmonary disease, unspecified: Secondary | ICD-10-CM | POA: Diagnosis present

## 2013-04-28 DIAGNOSIS — I10 Essential (primary) hypertension: Secondary | ICD-10-CM | POA: Diagnosis present

## 2013-04-28 DIAGNOSIS — D72829 Elevated white blood cell count, unspecified: Secondary | ICD-10-CM | POA: Diagnosis present

## 2013-04-28 DIAGNOSIS — R1310 Dysphagia, unspecified: Secondary | ICD-10-CM | POA: Diagnosis present

## 2013-04-28 DIAGNOSIS — R0602 Shortness of breath: Secondary | ICD-10-CM | POA: Diagnosis present

## 2013-04-28 DIAGNOSIS — Z87891 Personal history of nicotine dependence: Secondary | ICD-10-CM

## 2013-04-28 DIAGNOSIS — F411 Generalized anxiety disorder: Secondary | ICD-10-CM | POA: Diagnosis present

## 2013-04-28 DIAGNOSIS — H9190 Unspecified hearing loss, unspecified ear: Secondary | ICD-10-CM | POA: Diagnosis present

## 2013-04-28 DIAGNOSIS — F0392 Unspecified dementia, unspecified severity, with psychotic disturbance: Secondary | ICD-10-CM | POA: Diagnosis present

## 2013-04-28 DIAGNOSIS — G40909 Epilepsy, unspecified, not intractable, without status epilepticus: Secondary | ICD-10-CM | POA: Diagnosis present

## 2013-04-28 DIAGNOSIS — E119 Type 2 diabetes mellitus without complications: Secondary | ICD-10-CM

## 2013-04-28 DIAGNOSIS — A419 Sepsis, unspecified organism: Principal | ICD-10-CM | POA: Diagnosis present

## 2013-04-28 DIAGNOSIS — J4489 Other specified chronic obstructive pulmonary disease: Secondary | ICD-10-CM | POA: Diagnosis present

## 2013-04-28 DIAGNOSIS — F0390 Unspecified dementia without behavioral disturbance: Secondary | ICD-10-CM | POA: Diagnosis present

## 2013-04-28 DIAGNOSIS — H409 Unspecified glaucoma: Secondary | ICD-10-CM | POA: Diagnosis present

## 2013-04-28 DIAGNOSIS — G8929 Other chronic pain: Secondary | ICD-10-CM | POA: Diagnosis present

## 2013-04-28 DIAGNOSIS — N390 Urinary tract infection, site not specified: Secondary | ICD-10-CM | POA: Diagnosis present

## 2013-04-28 DIAGNOSIS — Z87440 Personal history of urinary (tract) infections: Secondary | ICD-10-CM

## 2013-04-28 LAB — CBC WITH DIFFERENTIAL/PLATELET
Basophils Absolute: 0 10*3/uL (ref 0.0–0.1)
Eosinophils Absolute: 0.1 10*3/uL (ref 0.0–0.7)
Eosinophils Relative: 1 % (ref 0–5)
HCT: 35.7 % — ABNORMAL LOW (ref 39.0–52.0)
Lymphocytes Relative: 20 % (ref 12–46)
MCH: 28 pg (ref 26.0–34.0)
MCV: 92.5 fL (ref 78.0–100.0)
Monocytes Absolute: 1.7 10*3/uL — ABNORMAL HIGH (ref 0.1–1.0)
RDW: 15.4 % (ref 11.5–15.5)
WBC: 11.7 10*3/uL — ABNORMAL HIGH (ref 4.0–10.5)

## 2013-04-28 LAB — COMPREHENSIVE METABOLIC PANEL
CO2: 32 mEq/L (ref 19–32)
Calcium: 10.6 mg/dL — ABNORMAL HIGH (ref 8.4–10.5)
Creatinine, Ser: 1.15 mg/dL (ref 0.50–1.35)
GFR calc Af Amer: 67 mL/min — ABNORMAL LOW (ref >90)
GFR calc non Af Amer: 58 mL/min — ABNORMAL LOW (ref >90)
Glucose, Bld: 103 mg/dL — ABNORMAL HIGH (ref 70–99)
Total Protein: 7.8 g/dL (ref 6.0–8.3)

## 2013-04-28 LAB — URINALYSIS, ROUTINE W REFLEX MICROSCOPIC
Protein, ur: 30 mg/dL — AB
Urobilinogen, UA: 0.2 mg/dL (ref 0.0–1.0)

## 2013-04-28 LAB — BLOOD GAS, ARTERIAL
O2 Content: 4 L/min
pCO2 arterial: 53.9 mmHg — ABNORMAL HIGH (ref 35.0–45.0)
pH, Arterial: 7.364 (ref 7.350–7.450)
pO2, Arterial: 121 mmHg — ABNORMAL HIGH (ref 80.0–100.0)

## 2013-04-28 LAB — URINE MICROSCOPIC-ADD ON

## 2013-04-28 LAB — TROPONIN I: Troponin I: <0.3 ng/mL (ref <0.30)

## 2013-04-28 MED ORDER — DIVALPROEX SODIUM 125 MG PO CPSP
500.0000 mg | ORAL_CAPSULE | Freq: Three times a day (TID) | ORAL | Status: DC
  Administered 2013-04-29 – 2013-04-30 (×6): 500 mg via ORAL
  Filled 2013-04-28 (×26): qty 4

## 2013-04-28 MED ORDER — ACETAMINOPHEN 650 MG RE SUPP: 650.0000 mg | Freq: Four times a day (QID) | RECTAL | Status: DC | PRN

## 2013-04-28 MED ORDER — DONEPEZIL HCL 5 MG PO TABS
10.0000 mg | ORAL_TABLET | Freq: Every day | ORAL | Status: DC
  Administered 2013-04-28 – 2013-04-30 (×3): 10 mg via ORAL
  Filled 2013-04-28: qty 1
  Filled 2013-04-28 (×2): qty 2
  Filled 2013-04-28: qty 1
  Filled 2013-04-28: qty 2

## 2013-04-28 MED ORDER — ALBUTEROL SULFATE (5 MG/ML) 0.5% IN NEBU
2.5000 mg | INHALATION_SOLUTION | Freq: Four times a day (QID) | RESPIRATORY_TRACT | Status: DC
  Administered 2013-04-28 – 2013-05-01 (×12): 2.5 mg via RESPIRATORY_TRACT
  Filled 2013-04-28 (×12): qty 0.5

## 2013-04-28 MED ORDER — ALBUTEROL SULFATE (5 MG/ML) 0.5% IN NEBU
2.5000 mg | INHALATION_SOLUTION | RESPIRATORY_TRACT | Status: AC
  Administered 2013-04-28: 2.5 mg via RESPIRATORY_TRACT
  Filled 2013-04-28: qty 0.5

## 2013-04-28 MED ORDER — ONDANSETRON HCL 4 MG PO TABS: 4.0000 mg | ORAL_TABLET | Freq: Four times a day (QID) | ORAL | Status: DC | PRN

## 2013-04-28 MED ORDER — SIMVASTATIN 20 MG PO TABS
20.0000 mg | ORAL_TABLET | Freq: Every day | ORAL | Status: DC
  Administered 2013-04-29 – 2013-04-30 (×2): 20 mg via ORAL
  Filled 2013-04-28 (×3): qty 1

## 2013-04-28 MED ORDER — BRIMONIDINE TARTRATE 0.15 % OP SOLN
1.0000 [drp] | Freq: Two times a day (BID) | OPHTHALMIC | Status: DC
  Administered 2013-04-29 – 2013-05-01 (×5): 1 [drp] via OPHTHALMIC
  Filled 2013-04-28 (×18): qty 0.1

## 2013-04-28 MED ORDER — LEVETIRACETAM 500 MG PO TABS
250.0000 mg | ORAL_TABLET | Freq: Two times a day (BID) | ORAL | Status: DC
  Administered 2013-04-28 – 2013-05-01 (×6): 250 mg via ORAL
  Filled 2013-04-28 (×7): qty 1
  Filled 2013-04-28: qty 2
  Filled 2013-04-28: qty 1
  Filled 2013-04-28: qty 2

## 2013-04-28 MED ORDER — ENOXAPARIN SODIUM 40 MG/0.4ML ~~LOC~~ SOLN
40.0000 mg | SUBCUTANEOUS | Status: DC
  Administered 2013-04-28 – 2013-04-30 (×3): 40 mg via SUBCUTANEOUS
  Filled 2013-04-28 (×3): qty 0.4

## 2013-04-28 MED ORDER — IPRATROPIUM BROMIDE 0.02 % IN SOLN
0.5000 mg | RESPIRATORY_TRACT | Status: AC
  Administered 2013-04-28: 0.5 mg via RESPIRATORY_TRACT
  Filled 2013-04-28: qty 2.5

## 2013-04-28 MED ORDER — CITALOPRAM HYDROBROMIDE 20 MG PO TABS
10.0000 mg | ORAL_TABLET | Freq: Every day | ORAL | Status: DC
  Administered 2013-04-29 – 2013-05-01 (×3): 10 mg via ORAL
  Filled 2013-04-28 (×5): qty 1

## 2013-04-28 MED ORDER — SODIUM CHLORIDE 0.9 % IV SOLN
INTRAVENOUS | Status: AC
  Administered 2013-04-28 – 2013-04-29 (×3): via INTRAVENOUS

## 2013-04-28 MED ORDER — AMLODIPINE BESYLATE 5 MG PO TABS
5.0000 mg | ORAL_TABLET | Freq: Every day | ORAL | Status: DC
  Administered 2013-04-29 – 2013-05-01 (×3): 5 mg via ORAL
  Filled 2013-04-28 (×3): qty 1

## 2013-04-28 MED ORDER — PILOCARPINE HCL 1 % OP SOLN
1.0000 [drp] | Freq: Four times a day (QID) | OPHTHALMIC | Status: DC
  Administered 2013-04-29 – 2013-05-01 (×9): 1 [drp] via OPHTHALMIC
  Filled 2013-04-28: qty 15

## 2013-04-28 MED ORDER — DEXTROSE 5 % IV SOLN
1.0000 g | Freq: Two times a day (BID) | INTRAVENOUS | Status: DC
  Administered 2013-04-29 – 2013-05-01 (×5): 1 g via INTRAVENOUS
  Filled 2013-04-28 (×11): qty 1

## 2013-04-28 MED ORDER — VANCOMYCIN HCL IN DEXTROSE 750-5 MG/150ML-% IV SOLN
750.0000 mg | Freq: Two times a day (BID) | INTRAVENOUS | Status: DC
  Administered 2013-04-29 – 2013-04-30 (×3): 750 mg via INTRAVENOUS
  Filled 2013-04-28 (×3): qty 150

## 2013-04-28 MED ORDER — ONDANSETRON HCL 4 MG/2ML IJ SOLN: 4.0000 mg | Freq: Four times a day (QID) | INTRAMUSCULAR | Status: DC | PRN

## 2013-04-28 MED ORDER — ALBUTEROL SULFATE (5 MG/ML) 0.5% IN NEBU
2.5000 mg | INHALATION_SOLUTION | RESPIRATORY_TRACT | Status: DC | PRN
  Administered 2013-04-29 – 2013-04-30 (×2): 2.5 mg via RESPIRATORY_TRACT
  Filled 2013-04-28 (×2): qty 0.5

## 2013-04-28 MED ORDER — POLYETHYLENE GLYCOL 3350 17 G PO PACK
17.0000 g | PACK | Freq: Every day | ORAL | Status: DC
  Administered 2013-04-29 – 2013-05-01 (×3): 17 g via ORAL
  Filled 2013-04-28 (×3): qty 1

## 2013-04-28 MED ORDER — PANTOPRAZOLE SODIUM 40 MG PO TBEC
40.0000 mg | DELAYED_RELEASE_TABLET | Freq: Every day | ORAL | Status: DC
  Administered 2013-04-28 – 2013-05-01 (×4): 40 mg via ORAL
  Filled 2013-04-28 (×4): qty 1

## 2013-04-28 MED ORDER — VANCOMYCIN HCL IN DEXTROSE 1-5 GM/200ML-% IV SOLN
1000.0000 mg | INTRAVENOUS | Status: AC
  Administered 2013-04-28: 1000 mg via INTRAVENOUS
  Filled 2013-04-28: qty 200

## 2013-04-28 MED ORDER — DORZOLAMIDE HCL-TIMOLOL MAL 2-0.5 % OP SOLN
1.0000 [drp] | Freq: Two times a day (BID) | OPHTHALMIC | Status: DC
  Administered 2013-04-29 – 2013-05-01 (×5): 1 [drp] via OPHTHALMIC
  Filled 2013-04-28: qty 10

## 2013-04-28 MED ORDER — SODIUM CHLORIDE 0.9 % IJ SOLN
3.0000 mL | Freq: Two times a day (BID) | INTRAMUSCULAR | Status: DC
  Administered 2013-04-28 – 2013-05-01 (×4): 3 mL via INTRAVENOUS

## 2013-04-28 MED ORDER — DEXTROSE 5 % IV SOLN
2.0000 g | Freq: Once | INTRAVENOUS | Status: AC
  Administered 2013-04-28: 2 g via INTRAVENOUS
  Filled 2013-04-28: qty 2

## 2013-04-28 MED ORDER — ACETAMINOPHEN 325 MG PO TABS
650.0000 mg | ORAL_TABLET | Freq: Four times a day (QID) | ORAL | Status: DC | PRN
  Administered 2013-04-29 – 2013-04-30 (×2): 650 mg via ORAL
  Filled 2013-04-28 (×3): qty 2

## 2013-04-28 MED ORDER — ACETAMINOPHEN 650 MG RE SUPP
RECTAL | Status: AC
  Administered 2013-04-28: 650 mg via RECTAL
  Filled 2013-04-28: qty 1

## 2013-04-28 MED ORDER — SODIUM CHLORIDE 0.9 % IV BOLUS (SEPSIS)
1000.0000 mL | Freq: Once | INTRAVENOUS | Status: AC
  Administered 2013-04-28: 1000 mL via INTRAVENOUS

## 2013-04-28 MED ORDER — BOOST / RESOURCE BREEZE PO LIQD
1.0000 | Freq: Two times a day (BID) | ORAL | Status: DC
  Administered 2013-04-29 – 2013-04-30 (×4): 1 via ORAL
  Filled 2013-04-28: qty 1

## 2013-04-28 MED ORDER — ACETAMINOPHEN 650 MG RE SUPP
650.0000 mg | Freq: Once | RECTAL | Status: AC
  Administered 2013-04-28: 650 mg via RECTAL

## 2013-04-28 MED ORDER — MONTELUKAST SODIUM 10 MG PO TABS
10.0000 mg | ORAL_TABLET | Freq: Every morning | ORAL | Status: DC
  Administered 2013-04-29 – 2013-05-01 (×3): 10 mg via ORAL
  Filled 2013-04-28 (×3): qty 1

## 2013-04-28 MED ORDER — INSULIN ASPART 100 UNIT/ML ~~LOC~~ SOLN
0.0000 [IU] | Freq: Three times a day (TID) | SUBCUTANEOUS | Status: DC
  Administered 2013-04-29 (×2): 1 [IU] via SUBCUTANEOUS
  Administered 2013-04-30: 2 [IU] via SUBCUTANEOUS
  Administered 2013-04-30: 1 [IU] via SUBCUTANEOUS

## 2013-04-28 MED ORDER — LORAZEPAM 0.5 MG PO TABS
0.5000 mg | ORAL_TABLET | Freq: Two times a day (BID) | ORAL | Status: DC
  Administered 2013-04-28 – 2013-05-01 (×6): 0.5 mg via ORAL
  Filled 2013-04-28 (×6): qty 1

## 2013-04-28 MED ORDER — LATANOPROST 0.005 % OP SOLN
1.0000 [drp] | Freq: Every day | OPHTHALMIC | Status: DC
  Administered 2013-04-29 – 2013-04-30 (×2): 1 [drp] via OPHTHALMIC
  Filled 2013-04-28: qty 2.5

## 2013-04-28 NOTE — ED Provider Notes (Signed)
CSN: [id number]     Arrival date & time 04/28/13  1653 History  This chart was scribed for Junius Argyle, MD by Ralene Bathe, ED Scribe and Bennett Scrape, ED Scribe. This patient was seen in room APA18/APA18 and the patient's care was started at 5:30 PM.   Chief Complaint  Patient presents with  . Shortness of Breath  . Fever    Patient is a 79 y.o. male presenting with fever. The history is provided by the patient and the nursing home. No language interpreter was used.  Fever Max temp prior to arrival:  102 Timing:  Constant Progression:  Worsening Chronicity:  New Associated symptoms: no chest pain, no congestion, no cough, no dysuria, no nausea, no rash and no vomiting   Associated symptoms comment:  SOB and shaking.   HPI Comments: [name] [name] is a 79 y.o. male who presents to the Emergency Department from [facility] complaining of a fever of 102 that started today. Fever is associated with SOB and "shaking" and is 102.8 in the ED. Per SNF, pt is on 2L of O2 at baseline. O2 was increased to 4L in the ED with improvement. SNF also reports pt was given 2 breathing treatments today for his symptoms. At baseline, pt is oriented but [facility] and does not ambulate. SNF denies any recent admissions within the past 2 months. Pt denies cough, nausea, emesis and CP. Pt has h/o diabetes, cancer, respiratory failure. Pt smoked for 10-15 years but quit 30 years ago.   Pt is not a DNR  Past Medical History  Diagnosis Date  . Glaucoma   . Anxiety   . GERD (gastroesophageal reflux disease)   . Hyperlipidemia   . Hypertension   . Chronic abdominal pain   . 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 Chronic respiratory failure   . Altered mental status 06/12/2012; 08/14/2012    Unwitnessed isolated seizure suspected, but because of the EEG being normal, Dr. Gerilyn Pilgrim did not recommend antiseizure therapy.; "violent since 08/13/2012" (08/14/2012)  . Hard of hearing   .  Seizure 06/14/2012    Isolated seizure suspected, but not confirmed.  . Bradycardia   . Complication of anesthesia 2005    "couldn't get him woke up when he should have after eye surgery" (08/14/2012)  . Mesothelioma   . Pleural plaque 04/2012  . Exertional dyspnea   . DM type 2 (diabetes mellitus, type 2) 06/13/2012  . UTI (lower urinary tract infection)    Past Surgical History  Procedure Laterality Date  . Inguinal hernia repair    . Prostate surgery    . Left eye surgery for glaucoma at [facility]  10/2009  . Cataract extraction w/ intraocular lens implant  2005    "left" (08/14/2012)   Family History  Problem Relation Age of Onset  . Lung cancer Sister   . Breast cancer Sister    History  Substance Use Topics  . Smoking status: Former Smoker -- 2.00 packs/day for 40 years    Types: Cigarettes  . Smokeless tobacco: Never Used     Comment: "quit smoking cigarettes ~ 2001"  . Alcohol Use: Yes     Comment: 08/14/2012 "used to drink heavily; stopped ~ 2001"    Review of Systems  Constitutional: Positive for fever.  HENT: Negative for congestion and neck pain.   Eyes: Negative for visual disturbance.  Respiratory: Positive for shortness of breath. Negative for cough.   Cardiovascular: Negative for chest pain.  Gastrointestinal: Negative for nausea and vomiting.  Endocrine: Negative for polyuria.  Genitourinary: Negative for dysuria.  Musculoskeletal: Negative for back pain and arthralgias.  Skin: Negative for color change and rash.  Allergic/Immunologic: Negative for immunocompromised state.  Neurological: Negative for dizziness, speech difficulty and numbness.  Hematological: Negative for adenopathy.  Psychiatric/Behavioral: Negative for behavioral problems and agitation.  All other systems reviewed and are negative.    Allergies  Review of patient's allergies indicates no known allergies.  Home Medications   Current Outpatient Rx  Name  Route  Sig  Dispense  Refill   . albuterol (PROVENTIL) (2.5 MG/3ML) 0.083% nebulizer solution   Nebulization   Take 2.5 mg by nebulization 4 (four) times daily.         Marland Kitchen ALPRAZolam (XANAX) 0.25 MG tablet   Oral   Take 0.25 mg by mouth 2 (two) times daily.         Marland Kitchen amLODipine (NORVASC) 5 MG tablet   Oral   Take 1 tablet (5 mg total) by mouth daily.   30 tablet   0   . brimonidine (ALPHAGAN P) 0.1 % SOLN   Both Eyes   Place 1 drop into both eyes at bedtime.          . divalproex (DEPAKOTE) 500 MG DR tablet   Oral   Take 500 mg by mouth 3 (three) times daily.         Marland Kitchen donepezil (ARICEPT) 10 MG tablet   Oral   Take 10 mg by mouth at bedtime.         . Dorzolamide HCl-Timolol Mal PF 22.3-6.8 MG/ML SOLN   Ophthalmic   Apply 1 drop to eye 2 (two) times daily.          . feeding supplement (RESOURCE BREEZE) LIQD   Oral   Take 1 Container by mouth 2 (two) times daily between meals.         . latanoprost (XALATAN) 0.005 % ophthalmic solution   Left Eye   Place 1 drop into the left eye at bedtime.         . levETIRAcetam (KEPPRA) 250 MG tablet   Oral   Take 1 tablet (250 mg total) by mouth every 12 (twelve) hours.   60 tablet   0   . lovastatin (MEVACOR) 40 MG tablet   Oral   Take 40 mg by mouth at bedtime.           Marland Kitchen EXPIRED: metFORMIN (GLUCOPHAGE) 500 MG tablet   Oral   Take 1 tablet (500 mg total) by mouth 2 (two) times daily with a meal.   60 tablet   11   . montelukast (SINGULAIR) 10 MG tablet   Oral   Take 10 mg by mouth at bedtime.           . Multiple Vitamins-Minerals (MULTIVITAMIN WITH MINERALS) tablet   Oral   Take 1 tablet by mouth daily.   30 tablet   6   . omeprazole (PRILOSEC) 20 MG capsule   Oral   Take 20 mg by mouth daily.         . pilocarpine (PILOCAR) 1 % ophthalmic solution   Both Eyes   Place 1 drop into both eyes Daily.          . polyethylene glycol (MIRALAX / GLYCOLAX) packet   Oral   Take 17 g by mouth daily.         Marland Kitchen  senna-docusate (DOC-Q-LAX) 8.6-50  MG per tablet   Oral   Take 1 tablet by mouth 2 (two) times daily.         . simethicone (MYLICON) 80 MG chewable tablet   Oral   Chew 1 tablet (80 mg total) by mouth 4 (four) times daily as needed for flatulence.   30 tablet   0    Triage Vitals: BP 122/66  Pulse 92  Temp(Src) 102.8 F (39.3 C) (Rectal)  Resp 22  SpO2 100%  Physical Exam  Nursing note and vitals reviewed. Constitutional: He appears well-developed and well-nourished. No distress.  HENT:  Head: Normocephalic and atraumatic.  Mouth/Throat: Oropharynx is clear and moist.  Eyes: EOM are normal.  Neck: Neck supple. No tracheal deviation present.  Cardiovascular: Regular rhythm.   Hr 101. sinus tachycardia.  Pulmonary/Chest: Effort normal. No respiratory distress.  Mild crackles at the lung bases.  Abdominal: He exhibits no distension and no mass. There is no tenderness. There is no rebound and no guarding.  Musculoskeletal: Normal range of motion.  Neurological: He is alert.  Oriented x 2. Does not know year.  Skin: Skin is warm and dry.  Psychiatric: He has a normal mood and affect. His behavior is normal.    ED Course   Medications  vancomycin (VANCOCIN) IVPB 1000 mg/200 mL premix (not administered)  ceFEPIme (MAXIPIME) 2 g in dextrose 5 % 50 mL IVPB (not administered)  acetaminophen (TYLENOL) suppository 650 mg (650 mg Rectal Given 04/28/13 1710)  sodium chloride 0.9 % bolus 1,000 mL (1,000 mL Intravenous New Bag/Given 04/28/13 1731)  ipratropium (ATROVENT) nebulizer solution 0.5 mg (0.5 mg Nebulization Given 04/28/13 1743)    And  albuterol (PROVENTIL) (5 MG/ML) 0.5% nebulizer solution 2.5 mg (2.5 mg Nebulization Given 04/28/13 1744)   DIAGNOSTIC STUDIES: Oxygen Saturation is 100% on 4L [place], normal by my interpretation.    COORDINATION OF CARE: 5:36 PM-Discussed treatment plan which includes breathing treatment, CXR, CBC panel, CMP and UA with pt at bedside and pt  agreed to plan.     Procedures (including critical care time)  Labs Reviewed  CBC WITH DIFFERENTIAL - Abnormal; Notable for the following:    WBC 11.7 (*)    RBC 3.86 (*)    Hemoglobin 10.8 (*)    HCT 35.7 (*)    Monocytes Relative 14 (*)    Monocytes Absolute 1.7 (*)    All other components within normal limits  COMPREHENSIVE METABOLIC PANEL - Abnormal; Notable for the following:    Glucose, Bld 103 (*)    Calcium 10.6 (*)    Albumin 3.3 (*)    Total Bilirubin 0.2 (*)    GFR calc non Af Amer 58 (*)    GFR calc Af Amer 67 (*)    All other components within normal limits  BLOOD GAS, ARTERIAL - Abnormal; Notable for the following:    pCO2 arterial 53.9 (*)    pO2, Arterial 121.0 (*)    Bicarbonate 30.0 (*)    Acid-Base Excess 4.9 (*)    All other components within normal limits  URINALYSIS, ROUTINE W REFLEX MICROSCOPIC - Abnormal; Notable for the following:    APPearance HAZY (*)    Hgb urine dipstick MODERATE (*)    Protein, ur 30 (*)    Leukocytes, UA MODERATE (*)    All other components within normal limits  URINE MICROSCOPIC-ADD ON - Abnormal; Notable for the following:    Bacteria, UA MANY (*)    All other components within normal  limits  COMPREHENSIVE METABOLIC PANEL - Abnormal; Notable for the following:    Glucose, Bld 122 (*)    Albumin 2.7 (*)    Total Bilirubin 0.2 (*)    GFR calc non Af Amer 58 (*)    GFR calc Af Amer 67 (*)    All other components within normal limits  CBC - Abnormal; Notable for the following:    RBC 3.40 (*)    Hemoglobin 9.5 (*)    HCT 31.2 (*)    Platelets 147 (*)    All other components within normal limits  GLUCOSE, CAPILLARY - Abnormal; Notable for the following:    Glucose-Capillary 114 (*)    All other components within normal limits  CULTURE, BLOOD (ROUTINE X 2)  CULTURE, BLOOD (ROUTINE X 2)  URINE CULTURE  LACTIC ACID, PLASMA  TROPONIN I  TROPONIN I  TROPONIN I  TROPONIN I  GLUCOSE, CAPILLARY  TSH  HEMOGLOBIN A1C    Dg Chest 2 View  04/29/2013   *RADIOLOGY REPORT*  Clinical Data: Shortness of breath, weakness, follow-up; history smoking, diabetes  CHEST - 2 VIEW  Comparison: 04/28/2013 Correlation:  CT chest 04/16/2012  Findings: Upper normal heart size. Mediastinal contours and pulmonary vascularity normal. Emphysematous changes without infiltrate, pleural effusion or pneumothorax. Minimal peribronchial thickening. Bilateral pleural plaque disease, left greater than right, unchanged; observed plaques demonstrated partial calcification on prior CT, question asbestos exposure. Bones unremarkable.  IMPRESSION: Mild emphysematous changes. Prior asbestos exposure. No acute abnormalities. No interval change.   Original Report Authenticated By: Ulyses Southward, M.D.   Dg Abd 1 View  04/29/2013   *RADIOLOGY REPORT*  Clinical Data: Left lower quadrant pain  ABDOMEN - 1 VIEW  Comparison: CT 02/27/2012  Findings: Limited anatomic detail due to obesity and technique. Normal bowel gas pattern.  Negative for bowel obstruction.  Foley catheter noted.  IMPRESSION: No acute abnormality.   Original Report Authenticated By: Janeece Riggers, M.D.   Dg Chest Port 1 View  04/28/2013   *RADIOLOGY REPORT*  Clinical Data: Rule out infection  PORTABLE CHEST - 1 VIEW  Comparison: 10/17/2012  Findings:  Heart size is normal.  No pleural effusion or edema.  Chronic interstitial change of COPD/emphysema identified.  Pleural plaques are again noted and appears similar to previous exam.  Visualized osseous structures are unremarkable.  IMPRESSION:  1.  No acute findings. 2.  COPD 3.  Pleural plaques compatible with prior asbestos exposure.   Original Report Authenticated By: Signa Kell, M.D.   1. UTI (lower urinary tract infection)   2. SOB (shortness of breath)   3. Fever   4. Leukocytosis   5. Shortness of breath   6. Abnormal urinalysis   7. DM type 2 (diabetes mellitus, type 2)   8. Seizure disorder     Date: 04/29/2013  Rate: 88   Rhythm: normal sinus rhythm  QRS Axis: normal  Intervals: normal  ST/T Wave abnormalities: nonspecific T wave changes  Conduction Disutrbances:none  Narrative Interpretation: T wave inversions in V5-V6 now seen  Old EKG Reviewed: changes noted   MDM  6:06 PM 79 y.o. male w hx of dementia, chronic resp failure on 2L [place] at baseline now on 3-4L via [place], mesothelioma pw sob and fever that began today. Pt denies cough. Febrile and mildly tachycardic here. Qualifies for sepsis criteria w/ suspicion for pna. Will cover HCAP w/ Vanc/cefepime. Breathing tx. Solumedrol as pt has hx of copd. Labs, IVF.   Pt remains stable. Breathing improved. Will  admit to hospitalist.   I personally performed the services described in this documentation, which was scribed in my presence. The recorded information has been reviewed and is accurate.      Junius Argyle, MD 04/29/13 1212

## 2013-04-28 NOTE — H&P (Signed)
[facility] History and Physical  [name] [name] WUJ:[id number] DOB: 01/11/1931 DOA: 04/28/2013   PCP: Pearson Grippe, MD  Specialists: None  Chief Complaint:  Shortness of breath since yesterday  HPI: [name] [name] is a 79 y.o. male with a past medical history of seizure disorder, dementia, glaucoma, diabetes, hypertension, who lives in a skilled nursing facility. He was sent over to the emergency department due to shortness of breath that started yesterday. Patient has dementia. He is able to provide some history but the history is limited. He stated that his shortness of breath started yesterday, but denies any cough. No chest pain. No nausea, vomiting. Denies any difficulty urination, but has been having lower abdominal discomfort for while. Denies any headaches. Denies any falls. He gets around in a wheelchair. He is blind. In the emergency department he was found to have a temperature of 102F. We were subsequently called for evaluation.  Home Medications: Prior to Admission medications   Medication Sig Start Date End Date Taking? Authorizing Provider  albuterol (PROVENTIL) (2.5 MG/3ML) 0.083% nebulizer solution Take 2.5 mg by nebulization 4 (four) times daily. 08/26/12  Yes Elease Etienne, MD  amLODipine (NORVASC) 5 MG tablet Take 1 tablet (5 mg total) by mouth daily. 08/26/12  Yes Elease Etienne, MD  brimonidine (ALPHAGAN P) 0.1 % SOLN Place 1 drop into both eyes 2 (two) times daily.    Yes Historical Provider, MD  citalopram (CELEXA) 10 MG tablet Take 10 mg by mouth daily.   Yes Historical Provider, MD  divalproex (DEPAKOTE SPRINKLE) 125 MG capsule Take 500 mg by mouth 3 (three) times daily.   Yes Historical Provider, MD  donepezil (ARICEPT) 10 MG tablet Take 10 mg by mouth at bedtime.   Yes Historical Provider, MD  Dorzolamide HCl-Timolol Mal PF 22.3-6.8 MG/ML SOLN Apply 1 drop to eye 2 (two) times daily.    Yes Historical Provider, MD  feeding supplement (RESOURCE  BREEZE) LIQD Take 1 Container by mouth 2 (two) times daily between meals.   Yes Historical Provider, MD  latanoprost (XALATAN) 0.005 % ophthalmic solution Place 1 drop into the left eye at bedtime.   Yes Historical Provider, MD  levETIRAcetam (KEPPRA) 250 MG tablet Take 1 tablet (250 mg total) by mouth every 12 (twelve) hours. 10/10/12  Yes Ward Givens, MD  LORazepam (ATIVAN) 0.5 MG tablet Take 0.5 mg by mouth 2 (two) times daily.   Yes Historical Provider, MD  lovastatin (MEVACOR) 40 MG tablet Take 40 mg by mouth at bedtime.     Yes Historical Provider, MD  metFORMIN (GLUCOPHAGE) 500 MG tablet Take 1 tablet (500 mg total) by mouth 2 (two) times daily with a meal. 04/16/12 04/28/13 Yes Kerri Perches, MD  montelukast (SINGULAIR) 10 MG tablet Take 10 mg by mouth every morning.    Yes Historical Provider, MD  Multiple Vitamins-Minerals (MULTIVITAMIN WITH MINERALS) tablet Take 1 tablet by mouth daily. 06/14/12  Yes Elliot Cousin, MD  omeprazole (PRILOSEC) 20 MG capsule Take 20 mg by mouth daily.   Yes Historical Provider, MD  pilocarpine (PILOCAR) 1 % ophthalmic solution Place 1 drop into both eyes 4 (four) times daily.  07/14/12  Yes Historical Provider, MD  polyethylene glycol (MIRALAX / GLYCOLAX) packet Take 17 g by mouth daily. 08/26/12  Yes Elease Etienne, MD  senna-docusate (DOC-Q-LAX) 8.6-50 MG per tablet Take 1 tablet by mouth 2 (two) times daily.   Yes Historical Provider, MD  Vitamin D, Ergocalciferol, (DRISDOL) 50000 UNITS  CAPS capsule Take 50,000 Units by mouth every Tuesday.   Yes Historical Provider, MD  simethicone (MYLICON) 80 MG chewable tablet Chew 1 tablet (80 mg total) by mouth 4 (four) times daily as needed for flatulence. 08/26/12   Elease Etienne, MD    Allergies: No Known Allergies  Past Medical History: Past Medical History  Diagnosis Date  . Glaucoma   . Anxiety   . GERD (gastroesophageal reflux disease)   . Hyperlipidemia   . Hypertension   . Chronic abdominal pain    . 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 Chronic respiratory failure   . Altered mental status 06/12/2012; 08/14/2012    Unwitnessed isolated seizure suspected, but because of the EEG being normal, Dr. Gerilyn Pilgrim did not recommend antiseizure therapy.; "violent since 08/13/2012" (08/14/2012)  . Hard of hearing   . Seizure 06/14/2012    Isolated seizure suspected, but not confirmed.  . Bradycardia   . Complication of anesthesia 2005    "couldn't get him woke up when he should have after eye surgery" (08/14/2012)  . Mesothelioma   . Pleural plaque 04/2012  . Exertional dyspnea   . DM type 2 (diabetes mellitus, type 2) 06/13/2012  . UTI (lower urinary tract infection)     Past Surgical History  Procedure Laterality Date  . Inguinal hernia repair    . Prostate surgery    . Left eye surgery for glaucoma at [facility]  10/2009  . Cataract extraction w/ intraocular lens implant  2005    "left" (08/14/2012)    Social History:  reports that he has quit smoking. His smoking use included Cigarettes. He has a 80 pack-year smoking history. He has never used smokeless tobacco. He reports that  drinks alcohol. He reports that he does not use illicit drugs.  Living Situation: Lives in a skilled nursing facility Activity Level: Gets around in a wheelchair   Family History:  Family History  Problem Relation Age of Onset  . Lung cancer Sister   . Breast cancer Sister      Review of Systems - Unable to obtain due to dementia  Physical Examination  Filed Vitals:   04/28/13 1745 04/28/13 1800 04/28/13 1833 04/28/13 1900  BP:  106/61  103/60  Pulse:  91 110 91  Temp:   100.5 F (38.1 C) 100.2 F (37.9 C)  TempSrc:      Resp:  21 26 19   SpO2: 100%       General appearance: alert, appears stated age, distracted, no distress and slowed mentation Head: Normocephalic, without obvious abnormality, atraumatic Eyes: conjunctivae/corneas clear. PERRL, EOM's intact. Throat: lips,  mucosa, and tongue normal; teeth and gums normal Neck: no adenopathy, no carotid bruit, no JVD, supple, symmetrical, trachea midline and thyroid not enlarged, symmetric, no tenderness/mass/nodules Resp: Coarse breath sounds bilaterally. Crackles at the right base. No wheezing. Cardio: regular rate and rhythm, S1, S2 normal, no murmur, click, rub or gallop GI: soft, non-tender; bowel sounds normal; no masses,  no organomegaly. Foley catheter was noted. Extremities: extremities normal, atraumatic, no cyanosis or edema Pulses: 2+ and symmetric Skin: No rashes noted. Lymph nodes: Cervical, supraclavicular, and axillary nodes normal. Neurologic: He is awake and alert. Doesn't answer to many questions. No cranial nerve deficits. Motor strength is equal, bilateral upper and lower extremities. Gait not assessed  Laboratory Data: Results for orders placed during the hospital encounter of 04/28/13 (from the past 48 hour(s))  CBC WITH DIFFERENTIAL     Status:  Abnormal   Collection Time    04/28/13  5:19 PM      Result Value Range   WBC 11.7 (*) 4.0 - 10.5 K/uL   RBC 3.86 (*) 4.22 - 5.81 MIL/uL   Hemoglobin 10.8 (*) 13.0 - 17.0 g/dL   HCT 16.1 (*) 09.6 - 04.5 %   MCV 92.5  78.0 - 100.0 fL   MCH 28.0  26.0 - 34.0 pg   MCHC 30.3  30.0 - 36.0 g/dL   RDW 40.9  81.1 - 91.4 %   Platelets 194  150 - 400 K/uL   Neutrophils Relative % 65  43 - 77 %   Neutro Abs 7.5  1.7 - 7.7 K/uL   Lymphocytes Relative 20  12 - 46 %   Lymphs Abs 2.4  0.7 - 4.0 K/uL   Monocytes Relative 14 (*) 3 - 12 %   Monocytes Absolute 1.7 (*) 0.1 - 1.0 K/uL   Eosinophils Relative 1  0 - 5 %   Eosinophils Absolute 0.1  0.0 - 0.7 K/uL   Basophils Relative 0  0 - 1 %   Basophils Absolute 0.0  0.0 - 0.1 K/uL  COMPREHENSIVE METABOLIC PANEL     Status: Abnormal   Collection Time    04/28/13  5:19 PM      Result Value Range   Sodium 137  135 - 145 mEq/L   Potassium 4.2  3.5 - 5.1 mEq/L   Chloride 98  96 - 112 mEq/L   CO2 32  19 -  32 mEq/L   Glucose, Bld 103 (*) 70 - 99 mg/dL   BUN 16  6 - 23 mg/dL   Creatinine, Ser 7.82  0.50 - 1.35 mg/dL   Calcium 95.6 (*) 8.4 - 10.5 mg/dL   Total Protein 7.8  6.0 - 8.3 g/dL   Albumin 3.3 (*) 3.5 - 5.2 g/dL   AST 18  0 - 37 U/L   ALT 13  0 - 53 U/L   Alkaline Phosphatase 66  39 - 117 U/L   Total Bilirubin 0.2 (*) 0.3 - 1.2 mg/dL   GFR calc non Af Amer 58 (*) >90 mL/min   GFR calc Af Amer 67 (*) >90 mL/min   Comment: (NOTE)     The eGFR has been calculated using the CKD EPI equation.     This calculation has not been validated in all clinical situations.     eGFR's persistently <90 mL/min signify possible Chronic Kidney     Disease.  TROPONIN I     Status: None   Collection Time    04/28/13  5:19 PM      Result Value Range   Troponin I <0.30  <0.30 ng/mL   Comment:            Due to the release kinetics of cTnI,     a negative result within the first hours     of the onset of symptoms does not rule out     myocardial infarction with certainty.     If myocardial infarction is still suspected,     repeat the test at appropriate intervals.  LACTIC ACID, PLASMA     Status: None   Collection Time    04/28/13  5:51 PM      Result Value Range   Lactic Acid, Venous 1.5  0.5 - 2.2 mmol/L  BLOOD GAS, ARTERIAL     Status: Abnormal   Collection Time    04/28/13  5:55 PM      Result Value Range   O2 Content 4.0     Delivery systems NASAL CANNULA     pH, Arterial 7.364  7.350 - 7.450   pCO2 arterial 53.9 (*) 35.0 - 45.0 mmHg   pO2, Arterial 121.0 (*) 80.0 - 100.0 mmHg   Bicarbonate 30.0 (*) 20.0 - 24.0 mEq/L   TCO2 27.6  0 - 100 mmol/L   Acid-Base Excess 4.9 (*) 0.0 - 2.0 mmol/L   O2 Saturation 98.3     Patient temperature 37.0     Collection site RIGHT BRACHIAL     Drawn by COLLECTED BY RT     Sample type ARTERIAL     Allens test (pass/fail) PASS  PASS  URINALYSIS, ROUTINE W REFLEX MICROSCOPIC     Status: Abnormal   Collection Time    04/28/13  6:10 PM      Result  Value Range   Color, Urine YELLOW  YELLOW   APPearance HAZY (*) CLEAR   Specific Gravity, Urine 1.020  1.005 - 1.030   pH 5.5  5.0 - 8.0   Glucose, UA NEGATIVE  NEGATIVE mg/dL   Hgb urine dipstick MODERATE (*) NEGATIVE   Bilirubin Urine NEGATIVE  NEGATIVE   Ketones, ur NEGATIVE  NEGATIVE mg/dL   Protein, ur 30 (*) NEGATIVE mg/dL   Urobilinogen, UA 0.2  0.0 - 1.0 mg/dL   Nitrite NEGATIVE  NEGATIVE   Leukocytes, UA MODERATE (*) NEGATIVE  URINE MICROSCOPIC-ADD ON     Status: Abnormal   Collection Time    04/28/13  6:10 PM      Result Value Range   WBC, UA TOO NUMEROUS TO COUNT  <3 WBC/hpf   RBC / HPF 3-6  <3 RBC/hpf   Bacteria, UA MANY (*) RARE    Radiology Reports: Dg Chest Port 1 View  04/28/2013   *RADIOLOGY REPORT*  Clinical Data: Rule out infection  PORTABLE CHEST - 1 VIEW  Comparison: 10/17/2012  Findings:  Heart size is normal.  No pleural effusion or edema.  Chronic interstitial change of COPD/emphysema identified.  Pleural plaques are again noted and appears similar to previous exam.  Visualized osseous structures are unremarkable.  IMPRESSION:  1.  No acute findings. 2.  COPD 3.  Pleural plaques compatible with prior asbestos exposure.   Original Report Authenticated By: Signa Kell, M.D.    Electrocardiogram: EKG shows sinus rhythm at 88 beats per minute. Normal axis. Intervals are normal. No Q waves. No concerning ST changes. Nonspecific T wave, changes noted in leads V5, and V6. These are new compared to previous EKG.  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 Chest x-ray does not show any acute findings. However, he does have a few  crackles in the right base on examination. He does have elevated white count and had fever. He could have pneumonia. His UA is also abnormal and could have UTI as well.  Plan: #1 fever: Most likely from either UTI or a pulmonary source. Blood cultures have been drawn. He'll be placed on broad-spectrum antibiotic coverage. Follow up on urine cultures as well.  #2  dyspnea: Most likely from infectious process. He'll be kept on vancomycin and cefepime for now. Consideration may be given to repeating a chest x-ray, after he's adequately hydrated. He'll be continued on oxygen, which he uses at the skilled nursing facility as well. Continue with his nebulizer treatment. Chest x-ray does suggest COPD, and he may benefit from inhaled steroids, but this can be deferred to a later date. He does have nonspecific changes on EKG. EKG will be repeated in the morning. Troponin be cycled. He denies any chest pain at this time.  #3 abnormal UA/possible UTI: Treat with antibiotics for now. Follow up on cultures.  #4 history of dementia: Continue with Aricept.  #5 diabetes mellitus, type II: Sliding scale insulin coverage will be prescribed. HbA1c will be checked. Hold metformin for now.  #6 dehydration with mild hypercalcemia: Reevaluate calcium levels after hydration. He will be given gentle IV hydration.  #7 history of hypertension: Continue with amlodipine  #8 history of glaucoma: Continue with his eye drops.  #9 history of seizure disorder: Continue with his antiepileptics.   DVT Prophylaxis: Lovenox Code Status: Full code per the daughters Family Communication: Discussed with the daughters  Disposition Plan: Admit to telemetry   Further management decisions will depend on results of further testing and patient's response to treatment.  [facility]  [facility] Pager [phone]  If 7PM-7AM, please contact night-coverage [email or web address] Password [facility]  04/28/2013, 8:15 PM

## 2013-04-28 NOTE — Progress Notes (Signed)
ANTIBIOTIC CONSULT NOTE - INITIAL  Pharmacy Consult for Vancomycin and Cefepime Indication: suspected pneumonia  No Known Allergies  Patient Measurements:     Vital Signs: Temp: 100.2 F (37.9 C) (08/19 1900) Temp src: Rectal (08/19 1659) BP: 103/60 mmHg (08/19 1900) Pulse Rate: 91 (08/19 1900) Intake/Output from previous day:   Intake/Output from this shift:    Labs:  Recent Labs  04/28/13 1719  WBC 11.7*  HGB 10.8*  PLT 194  CREATININE 1.15   The CrCl is unknown because both a height and weight (above a minimum accepted value) are required for this calculation. No results found for this basename: VANCOTROUGH, VANCOPEAK, VANCORANDOM, GENTTROUGH, GENTPEAK, GENTRANDOM, TOBRATROUGH, TOBRAPEAK, TOBRARND, AMIKACINPEAK, AMIKACINTROU, AMIKACIN,  in the last 72 hours   Microbiology: No results found for this or any previous visit (from the past 720 hour(s)).  Medical History: Past Medical History  Diagnosis Date  . Glaucoma   . Anxiety   . GERD (gastroesophageal reflux disease)   . Hyperlipidemia   . Hypertension   . Chronic abdominal pain   . 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 Chronic respiratory failure   . Altered mental status 06/12/2012; 08/14/2012    Unwitnessed isolated seizure suspected, but because of the EEG being normal, Dr. Gerilyn Pilgrim did not recommend antiseizure therapy.; "violent since 08/13/2012" (08/14/2012)  . Hard of hearing   . Seizure 06/14/2012    Isolated seizure suspected, but not confirmed.  . Bradycardia   . Complication of anesthesia 2005    "couldn't get him woke up when he should have after eye surgery" (08/14/2012)  . Mesothelioma   . Pleural plaque 04/2012  . Exertional dyspnea   . DM type 2 (diabetes mellitus, type 2) 06/13/2012  . UTI (lower urinary tract infection)    Assessment: 79 yo male admitted with suspected pna.  Pt received Cefepime 2gm and Vancomycin 1gm on admission.  Goal of Therapy:  Trough  15-20  Plan:  Vancomycin 750mg  IV q12h Check trough at steady state Cefepime 1gm IV q12h  Valrie Hart A 04/28/2013,9:20 PM

## 2013-04-28 NOTE — ED Notes (Signed)
Pt arrived by ems from [facility], pt having sob and fever since this am. Pt is normally on 02 at 3l.

## 2013-04-29 ENCOUNTER — Inpatient Hospital Stay (HOSPITAL_COMMUNITY): Payer: PRIVATE HEALTH INSURANCE

## 2013-04-29 DIAGNOSIS — N390 Urinary tract infection, site not specified: Secondary | ICD-10-CM

## 2013-04-29 DIAGNOSIS — J9610 Chronic respiratory failure, unspecified whether with hypoxia or hypercapnia: Secondary | ICD-10-CM

## 2013-04-29 LAB — CBC
MCH: 27.9 pg (ref 26.0–34.0)
MCHC: 30.4 g/dL (ref 30.0–36.0)
MCV: 91.8 fL (ref 78.0–100.0)
Platelets: 147 10*3/uL — ABNORMAL LOW (ref 150–400)
RDW: 15.4 % (ref 11.5–15.5)
WBC: 9.5 10*3/uL (ref 4.0–10.5)

## 2013-04-29 LAB — COMPREHENSIVE METABOLIC PANEL
ALT: 11 U/L (ref 0–53)
AST: 16 U/L (ref 0–37)
Albumin: 2.7 g/dL — ABNORMAL LOW (ref 3.5–5.2)
Alkaline Phosphatase: 55 U/L (ref 39–117)
BUN: 13 mg/dL (ref 6–23)
Chloride: 101 mEq/L (ref 96–112)
Potassium: 4.3 mEq/L (ref 3.5–5.1)
Sodium: 137 mEq/L (ref 135–145)
Total Bilirubin: 0.2 mg/dL — ABNORMAL LOW (ref 0.3–1.2)

## 2013-04-29 LAB — GLUCOSE, CAPILLARY
Glucose-Capillary: 133 mg/dL — ABNORMAL HIGH (ref 70–99)
Glucose-Capillary: 142 mg/dL — ABNORMAL HIGH (ref 70–99)
Glucose-Capillary: 99 mg/dL (ref 70–99)

## 2013-04-29 LAB — TROPONIN I: Troponin I: <0.3 ng/mL (ref <0.30)

## 2013-04-29 MED ORDER — VANCOMYCIN HCL IN DEXTROSE 750-5 MG/150ML-% IV SOLN
INTRAVENOUS | Status: AC
  Filled 2013-04-29: qty 150

## 2013-04-29 NOTE — Evaluation (Signed)
Clinical/Bedside Swallow Evaluation Patient Details  Name: [name] [name] MRN: [id number] Date of Birth: 1931-05-04  Today's Date: 04/29/2013 Time: 0900-0915 SLP Time Calculation (min): 15 min  Past Medical History:  Past Medical History  Diagnosis Date  . Glaucoma   . Anxiety   . GERD (gastroesophageal reflux disease)   . Hyperlipidemia   . Hypertension   . Chronic abdominal pain   . 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 Chronic respiratory failure   . Altered mental status 06/12/2012; 08/14/2012    Unwitnessed isolated seizure suspected, but because of the EEG being normal, Dr. Gerilyn Pilgrim did not recommend antiseizure therapy.; "violent since 08/13/2012" (08/14/2012)  . Hard of hearing   . Seizure 06/14/2012    Isolated seizure suspected, but not confirmed.  . Bradycardia   . Complication of anesthesia 2005    "couldn't get him woke up when he should have after eye surgery" (08/14/2012)  . Mesothelioma   . Pleural plaque 04/2012  . Exertional dyspnea   . DM type 2 (diabetes mellitus, type 2) 06/13/2012  . UTI (lower urinary tract infection)    Past Surgical History:  Past Surgical History  Procedure Laterality Date  . Inguinal hernia repair    . Prostate surgery    . Left eye surgery for glaucoma at [facility]  10/2009  . Cataract extraction w/ intraocular lens implant  2005    "left" (08/14/2012)   HPI:  [name] is a 79 y.o. male who presents to the Emergency Department from [facility] complaining of a fever of 102 that started today. Pt has hx of GERD, HTN, senile dementia, chronic respiratory failure, altered mental status, HOH, mesothelioma, UTI, DMII. During evaluation, pt was unable to answer questions related to his previous diet or PLOF.    Assessment / Plan / Recommendation Clinical Impression  Pt seen for BSE today in room. Pt presented clear vocal quality at baseline, but was vocalizing during each exhalation. Pt could not clearly answer  questions related to pain level or PLOF. SLP presented pt with ice chips, where he tolerated successfully though his oral phase was prolonged by excessive mastication. Provided sips of thin liquids, pt did  well with 5ml spoon presentations. When cup sips were attempted, pt had difficulty coordinating breathing to prepare for liquid presentation. With straw sips, pt presented with wet vocal quality post initial swallow, but swallowed again yielding clear vocal quality. This occurred in 1/8 presentations. Provided puree, pt did not present overt s/sx of aspiration post swallow. However, pt's laryngeal elevation was decreased and his swallow initiation was delayed. Because of concern for pt's airway protection, pureed diet is recommended with nursing present for feeding assistance and reminders of double swallows with thin liquids. Consider MBSS should pt not improve.    Aspiration Risk  Moderate    Diet Recommendation Dysphagia 1 (Puree);Thin liquid   Liquid Administration via: Spoon;Straw Medication Administration: Crushed with puree Supervision: Staff feed patient;Full supervision/cueing for compensatory strategies Compensations: Multiple dry swallows after each bite/sip;Slow rate;Small sips/bites Postural Changes and/or Swallow Maneuvers: Seated upright 90 degrees;Upright 30-60 min after meal    Other  Recommendations Oral Care Recommendations: Oral care BID   Follow Up Recommendations  None    Frequency and Duration min 2x/week  2 weeks   Pertinent Vitals/Pain Pt unable to clearly answer questions related to pain.     SLP Swallow Goals Patient will consume recommended diet without observed clinical signs of aspiration with: Supervision/safety Patient will utilize  recommended strategies during swallow to increase swallowing safety with: Supervision/safety   Swallow Study Prior Functional Status   Unknown. Pt was living at [facility], but pt unable to provide details regarding PLOF.      General Date of Onset: 04/29/13 HPI: [name] is a 79 y.o. male who presents to the Emergency Department from [facility] complaining of a fever of 102 that started today. Pt has hx of GERD, HTN, senile dementia, chronic respiratory failure, altered mental status, HOH, mesothelioma, UTI, DMII. During evaluation, pt was unable to answer questions related to his previous diet or PLOF.  Type of Study: Bedside swallow evaluation Diet Prior to this Study: Information not available Temperature Spikes Noted: N/A Respiratory Status: Supplemental O2 delivered via (comment) (Previously on 2L, increased to 4L upon admission.) Behavior/Cognition: Alert;Confused Oral Cavity - Dentition: Missing dentition Self-Feeding Abilities: Needs assist Patient Positioning: Upright in bed Baseline Vocal Quality: Low vocal intensity Volitional Cough: Weak;Wet Volitional Swallow: Able to elicit    Oral/Motor/Sensory Function Overall Oral Motor/Sensory Function: Appears within functional limits for tasks assessed   Ice Chips Ice chips: Within functional limits Presentation: Spoon Other Comments: Pt presented prolonged oral phase with excessive mastication. No overt s/sx of aspiration and/or penetration noted during or post swallow.    Thin Liquid Thin Liquid: Impaired Presentation: Straw Pharyngeal  Phase Impairments: Decreased hyoid-laryngeal movement;Wet Vocal Quality;Suspected delayed Swallow Other Comments: Pt initially presented "rattle" post swallow, but independently swallowed again, resulting in clear vocal quality.    Nectar Thick Nectar Thick Liquid: Not tested   Honey Thick Honey Thick Liquid: Not tested   Puree Puree: Within functional limits Presentation: Spoon Other Comments: Pt vocalized during exhalations throughout BSE and immediately post swallow of puree. Concerns for airway protection due to this behavior. However, no immediate s/sx of aspiration and/or penetration noted.      Solid   GO    Solid: Not tested       Messina Kosinski S 04/29/2013,9:51 AM

## 2013-04-29 NOTE — Progress Notes (Signed)
UR chart review completed.  

## 2013-04-29 NOTE — Care Management Note (Addendum)
    Page 1 of 1   05/01/2013     1:12:44 PM   CARE MANAGEMENT NOTE 05/01/2013  Patient:  [name] [name], [name] [name]   Account Number:  [id number]  Date Initiated:  04/29/2013  Documentation initiated by:  Sharrie Rothman  Subjective/Objective Assessment:   Pt admitted from [facility] with UTI. Pt will return at discharge.     Action/Plan:   CSW to arrange discharge to facility when medically stable.   Anticipated DC Date:  05/02/2013   Anticipated DC Plan:  SKILLED NURSING FACILITY  In-house referral  Clinical Social Worker      DC Planning Services  CM consult      Choice offered to / List presented to:             Status of service:  Completed, signed off Medicare Important Message given?  YES (If response is "NO", the following Medicare IM given date fields will be blank) Date Medicare IM given:  05/01/2013 Date Additional Medicare IM given:    Discharge Disposition:  SKILLED NURSING FACILITY  Per UR Regulation:    If discussed at Long Length of Stay Meetings, dates discussed:    Comments:  05/01/13 1310 Arlyss Queen, RN BSN CM Pt discharged to [facility] today. CSW to arrange discharge to facility.  04/29/13 1505 Arlyss Queen, RN BSN CM

## 2013-04-29 NOTE — Progress Notes (Signed)
Patient seen, independently examined and chart reviewed. I agree with exam, assessment and plan discussed with Ms. Elyn Peers.   79 year old man presented to the emergency department with fever and complaints of shortness of breath. Fever was suspected to be secondary to UTI, less likely pulmonary source. He was started on empiric antibiotics and continued on chronic oxygen therapy.  Remains febrile. Vitals otherwise stable. Stable hypoxia.excellent urine output charted  He is very hard of hearing but with shouting he understands quite well and appears to be appropriate. He follows commands. He appears calm and mildly uncomfortable. Cardiovascular regular rate and rhythm. No murmur, rub, gallop. Respiratory clear to auscultation bilaterally. No wheezes, rales, rhonchi. Normal respiratory effort. Abdomen soft, nontender, nondistended. Skin appears grossly unremarkable.    capillary blood sugars unremarkable Basic metabolic panel unremarkable Abdominal film unremarkable Repeat chest x-ray no acute abnormalities  Assessment/plan  Fever, presumably secondary to UTI, possible early sepsis. Hemodynamics remained stable. Continue empiric antibiotic therapy. Followup urine culture. Repeat chest x-ray was unremarkable. No evidence of pneumonia. Hypoxia. Stable.  Abdominal discomfort?: Likely secondary to UTI. Monitor clinically.   Chronic respiratory failure: Appears stable.  Diabetes mellitus  Dementia  Consultants: Speech therapy: Dysphagia 1 diet, thin liquids.   physical therapy: At normal level of function.  Brendia Sacks, MD [facility] [phone]

## 2013-04-29 NOTE — Progress Notes (Signed)
Physical Therapy Discharge Patient Details Name: [name] [name] MRN: [id number] DOB: [birth year] Today's Date: 04/29/2013 Time:  -     Patient discharged from PT services secondary to Pt was screened and is at prior functional level.  Pt states he is from [facility] and that he does not walk.  He is blind and has all ADL's with max assistance.Marland Kitchen   GP     Dantre Yearwood,CINDY 04/29/2013, 11:18 AM

## 2013-04-29 NOTE — Progress Notes (Signed)
[facility] PROGRESS NOTE  [name] [name] WUJ:[id number] DOB: 1931/01/02 DOA: 04/28/2013 PCP: Pearson Grippe, MD   79 yo male from SNF, Blind, very hard of hearing, with a h/o mesothelioma, DM, Seizure, and recurrent UTI.  Presents with Fever and SOB  Primary complaint this morning is left sided abdominal pain and being cold.  Assessment/Plan:  Fever:   Tmax 102.8, presently normal.  UTI or a pulmonary source.   U/A appears + for infection, while cxr shows only chronic changes.  Likely UTI  Blood cultures have been drawn.   Urine cultures ordered to be grown using previously collected specimen  On vanc and cefepime for possible HCAP.  These can likely be narrowed.  Will check cultures 8/21 and re-evaluate antibiotics  Dyspnea:   from infectious process.  vancomycin and cefepime.   continued oxygen, which he uses at the skilled nursing facility as well.   Continue with his nebulizer treatment.   Chest x-ray does suggest COPD, but no wheeze.  Defer steroids.   EKG shows NSR, Troponins x 3 are wnl.   Abnormal UA/ UTI:   Treat with antibiotics   Follow up on cultures.   Left sided abdominal pain  Could be secondary to UTI  Last stool yesterday (per patient)  Will check abdominal xray.  history of dementia:   Continue with Aricept.   diabetes mellitus, type II:   Sliding scale insulin - sensitive.  CBGs well controlled.  HbA1c will be checked.   Hold metformin for now.    dehydration with mild hypercalcemia:   Corrected calcium on 8/20 is 10.4  This appears chronic.   Will monitor.   history of hypertension: Continue with amlodipine    history of glaucoma: Continue with his eye drops.    history of seizure disorder: Continue with his antiepileptics. (keppra, depakote)   DVT Prophylaxis:  lovenox Code Status: full Family Communication: Disposition Plan: inpatient. Return to SNF when appropriate.    Antibiotics:  Vanc/  cefepime  HPI/Subjective: Complains of abdominal distension and pain on left lower side of abdomen.  Objective: Filed Vitals:   04/28/13 2339 04/29/13 0500 04/29/13 0812 04/29/13 1001  BP:  129/82    Pulse:  78 79   Temp:  98.6 F (37 C)    TempSrc:  Oral    Resp:   19   Height:      Weight:      SpO2: 96% 98% 98% 97%    Intake/Output Summary (Last 24 hours) at 04/29/13 1048 Last data filed at 04/29/13 0900  Gross per 24 hour  Intake    240 ml  Output    750 ml  Net   -510 ml   Filed Weights   04/28/13 2100  Weight: 78.9 kg (173 lb 15.1 oz)    Exam:   General:  Lying in bed, tremor, moaning.  Very HOH, (responds when I yell)  Cardiovascular: RRR, + sys murmur, no rubs or gallops, no lower extremity edema  Respiratory: CTA, no wheeze, crackles, or rales.  No increased work of breathing.  Abdomen: distended but not tight, +Bs, Tender to palpation on LLQ, no obvious masses  Musculoskeletal: moves all 4 extremities, no swelling. symmetric weakness noted in all 4 extremities - but able to push against gravity.  Data Reviewed: Basic Metabolic Panel:  Recent Labs Lab 04/28/13 1719 04/29/13 0317  NA 137 137  K 4.2 4.3  CL 98 101  CO2 32 32  GLUCOSE 103* 122*  BUN  16 13  CREATININE 1.15 1.15  CALCIUM 10.6* 9.4   Liver Function Tests:  Recent Labs Lab 04/28/13 1719 04/29/13 0317  AST 18 16  ALT 13 11  ALKPHOS 66 55  BILITOT 0.2* 0.2*  PROT 7.8 6.7  ALBUMIN 3.3* 2.7*   CBC:  Recent Labs Lab 04/28/13 1719 04/29/13 0317  WBC 11.7* 9.5  NEUTROABS 7.5  --   HGB 10.8* 9.5*  HCT 35.7* 31.2*  MCV 92.5 91.8  PLT 194 147*   Cardiac Enzymes:  Recent Labs Lab 04/28/13 1719 04/28/13 2038 04/29/13 0317 04/29/13 0811  TROPONINI <0.30 <0.30 <0.30 <0.30   CBG:  Recent Labs Lab 04/28/13 2111 04/29/13 0745  GLUCAP 96 114*    Recent Results (from the past 240 hour(s))  CULTURE, BLOOD (ROUTINE X 2)     Status: None   Collection Time     04/28/13  5:58 PM      Result Value Range Status   Specimen Description BLOOD LEFT ANTECUBITAL   Final   Special Requests     Final   Value: BOTTLES DRAWN AEROBIC AND ANAEROBIC AEB=15CC ANA=10CC   Culture NO GROWTH 1 DAY   Final   Report Status PENDING   Incomplete  CULTURE, BLOOD (ROUTINE X 2)     Status: None   Collection Time    04/28/13  6:00 PM      Result Value Range Status   Specimen Description BLOOD LEFT WRIST   Final   Special Requests BOTTLES DRAWN AEROBIC AND ANAEROBIC 12CC   Final   Culture NO GROWTH 1 DAY   Final   Report Status PENDING   Incomplete     Studies: Dg Chest 2 View  04/29/2013   *RADIOLOGY REPORT*  Clinical Data: Shortness of breath, weakness, follow-up; history smoking, diabetes  CHEST - 2 VIEW  Comparison: 04/28/2013 Correlation:  CT chest 04/16/2012  Findings: Upper normal heart size. Mediastinal contours and pulmonary vascularity normal. Emphysematous changes without infiltrate, pleural effusion or pneumothorax. Minimal peribronchial thickening. Bilateral pleural plaque disease, left greater than right, unchanged; observed plaques demonstrated partial calcification on prior CT, question asbestos exposure. Bones unremarkable.  IMPRESSION: Mild emphysematous changes. Prior asbestos exposure. No acute abnormalities. No interval change.   Original Report Authenticated By: Ulyses Southward, M.D.   Dg Abd 1 View  04/29/2013   *RADIOLOGY REPORT*  Clinical Data: Left lower quadrant pain  ABDOMEN - 1 VIEW  Comparison: CT 02/27/2012  Findings: Limited anatomic detail due to obesity and technique. Normal bowel gas pattern.  Negative for bowel obstruction.  Foley catheter noted.  IMPRESSION: No acute abnormality.   Original Report Authenticated By: Janeece Riggers, M.D.   Dg Chest Port 1 View  04/28/2013   *RADIOLOGY REPORT*  Clinical Data: Rule out infection  PORTABLE CHEST - 1 VIEW  Comparison: 10/17/2012  Findings:  Heart size is normal.  No pleural effusion or edema.  Chronic  interstitial change of COPD/emphysema identified.  Pleural plaques are again noted and appears similar to previous exam.  Visualized osseous structures are unremarkable.  IMPRESSION:  1.  No acute findings. 2.  COPD 3.  Pleural plaques compatible with prior asbestos exposure.   Original Report Authenticated By: Signa Kell, M.D.    Scheduled Meds: . albuterol  2.5 mg Nebulization QID  . amLODipine  5 mg Oral Daily  . brimonidine  1 drop Both Eyes BID  . ceFEPime (MAXIPIME) IV  1 g Intravenous Q12H  . citalopram  10 mg Oral Daily  .  divalproex  500 mg Oral TID  . donepezil  10 mg Oral QHS  . dorzolamide-timolol  1 drop Both Eyes BID  . enoxaparin (LOVENOX) injection  40 mg Subcutaneous Q24H  . feeding supplement  1 Container Oral BID BM  . insulin aspart  0-9 Units Subcutaneous TID WC  . latanoprost  1 drop Left Eye QHS  . levETIRAcetam  250 mg Oral Q12H  . LORazepam  0.5 mg Oral BID  . montelukast  10 mg Oral q morning - 10a  . pantoprazole  40 mg Oral Daily  . pilocarpine  1 drop Both Eyes QID  . polyethylene glycol  17 g Oral Daily  . simvastatin  20 mg Oral q1800  . sodium chloride  3 mL Intravenous Q12H  . 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 If 7PM-7AM, please contact night-coverage at [email or web address], password [facility] 04/29/2013, 10:48 AM  LOS: 1 day

## 2013-04-29 NOTE — Progress Notes (Signed)
Pt's oral temp 102.4. Administered oral Tylenol 650 mg per written order.  Ms. Elyn Peers, [place] made aware via text page.  Will reassess in one hour.

## 2013-04-30 DIAGNOSIS — R109 Unspecified abdominal pain: Secondary | ICD-10-CM

## 2013-04-30 LAB — GLUCOSE, CAPILLARY: Glucose-Capillary: 125 mg/dL — ABNORMAL HIGH (ref 70–99)

## 2013-04-30 LAB — BASIC METABOLIC PANEL
Chloride: 99 mEq/L (ref 96–112)
GFR calc Af Amer: 74 mL/min — ABNORMAL LOW (ref >90)
Potassium: 4.1 mEq/L (ref 3.5–5.1)
Sodium: 138 mEq/L (ref 135–145)

## 2013-04-30 LAB — URINE CULTURE

## 2013-04-30 LAB — CBC
Platelets: 170 10*3/uL (ref 150–400)
RDW: 15.4 % (ref 11.5–15.5)
WBC: 9.3 10*3/uL (ref 4.0–10.5)

## 2013-04-30 MED ORDER — VANCOMYCIN HCL IN DEXTROSE 750-5 MG/150ML-% IV SOLN
INTRAVENOUS | Status: AC
  Filled 2013-04-30: qty 150

## 2013-04-30 NOTE — Progress Notes (Signed)
Patient admitted from [facility]- message left for family to confirm d/c plans to return at d/c -awaiting callback- full assessment to follow-  Reece Levy, MSW [phone]

## 2013-04-30 NOTE — Progress Notes (Signed)
[facility] PROGRESS NOTE  TYMAR POLYAK ZOX:[id number] DOB: 01-19-1931 DOA: 04/28/2013 PCP: Pearson Grippe, MD  79 year old man presented with fever, currently being treated for UTI with improvement. If continues to improve would anticipate discharge less than 48 hours.  Assessment/Plan: 1. Fever: Presumably secondary to UTI. Possible early sepsis appears to have resolved. Hemodynamic stable. Narrow antibiotics therapy, followup urine culture. No evidence of pneumonia. 2. UTI: Continue treatment as above. 3. Chronic abdominal pain: He has no abdominal pain on examination today. Likely chronic, possibly complicated by UTI. 4. Chronic respiratory failure: Appears stable. Continue supplemental oxygen. 5. Diabetes mellitus: Capillary blood sugars stable. Continue sliding scale insulin. Resume metformin on discharge. Hemoglobin A1c 6.1. 6. Dementia: Appears stable. Continue Aricept. TSH normal. 7. History of seizure disorder: Appears stable. Continue Keppra, Depakote.   Discontinue vancomycin. Followup urine culture.  Pending studies:   Urine culture  Blood cultures  Code Status: Full code DVT prophylaxis: Lovenox Family Communication: None present. Discussed with 3 daughters at bedside last night. Disposition Plan: Return to [facility] 1-2 days.  Brendia Sacks, MD  [facility]  Pager [phone] If 7PM-7AM, please contact night-coverage at [email or web address], password [facility] 04/30/2013, 10:25 AM  LOS: 2 days   Clinical Summary: 79 year old man presented to the emergency department with fever and complaints of shortness of breath. Fever was suspected to be secondary to UTI, less likely pulmonary source. He was started on empiric antibiotics and continued on chronic oxygen therapy.  Consultants:  None   Procedures:    Antibiotics:  Cefepime 8/19 >>   Vancomycin 8/19 >> 8/21  HPI/Subjective: Feels better today. No pain. Ate very well yesterday. No  complaints.  Objective: Filed Vitals:   04/29/13 2108 04/30/13 0019 04/30/13 0532 04/30/13 0727  BP: 129/76  110/73   Pulse: 79  68   Temp: 98.7 F (37.1 C)  98.5 F (36.9 C)   TempSrc: Oral  Axillary   Resp: 20  20   Height:      Weight:      SpO2: 99% 95% 100% 99%    Intake/Output Summary (Last 24 hours) at 04/30/13 1025 Last data filed at 04/30/13 0900  Gross per 24 hour  Intake    930 ml  Output   4550 ml  Net  -3620 ml     Filed Weights   04/28/13 2100  Weight: 78.9 kg (173 lb 15.1 oz)    Exam:   Afebrile 12 hours. Vitals otherwise unremarkable. Hypoxia stable.   General: Appears calm and comfortable. Alert.  Psychiatric: Grossly normal mood and affect. Answers questions appropriately. Follows commands.  Cardiovascular: Regular rate and rhythm. No murmur, rub, gallop. No lower extremity edema.  Respiratory: Clear to auscultation bilaterally. No wheezes, rales, rhonchi. Normal respiratory effort.  Abdomen: Soft, nontender, nondistended. No left lower quadrant pain.  Musculoskeletal: Moves all extremities to command.  Data Reviewed:  Excellent urine output.  Basic metabolic panel unremarkable.  Scheduled Meds: . albuterol  2.5 mg Nebulization QID  . amLODipine  5 mg Oral Daily  . brimonidine  1 drop Both Eyes BID  . ceFEPime (MAXIPIME) IV  1 g Intravenous Q12H  . citalopram  10 mg Oral Daily  . divalproex  500 mg Oral TID  . donepezil  10 mg Oral QHS  . dorzolamide-timolol  1 drop Both Eyes BID  . enoxaparin (LOVENOX) injection  40 mg Subcutaneous Q24H  . feeding supplement  1 Container Oral BID BM  . insulin aspart  0-9 Units  Subcutaneous TID WC  . latanoprost  1 drop Left Eye QHS  . levETIRAcetam  250 mg Oral Q12H  . LORazepam  0.5 mg Oral BID  . montelukast  10 mg Oral q morning - 10a  . pantoprazole  40 mg Oral Daily  . pilocarpine  1 drop Both Eyes QID  . polyethylene glycol  17 g Oral Daily  . simvastatin  20 mg Oral q1800  . sodium  chloride  3 mL Intravenous Q12H  . 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

## 2013-05-01 LAB — BASIC METABOLIC PANEL
BUN: 13 mg/dL (ref 6–23)
CO2: 35 mEq/L — ABNORMAL HIGH (ref 19–32)
Calcium: 10.1 mg/dL (ref 8.4–10.5)
GFR calc non Af Amer: 75 mL/min — ABNORMAL LOW (ref >90)
Glucose, Bld: 96 mg/dL (ref 70–99)
Potassium: 4.4 mEq/L (ref 3.5–5.1)

## 2013-05-01 LAB — GLUCOSE, CAPILLARY: Glucose-Capillary: 83 mg/dL (ref 70–99)

## 2013-05-01 MED ORDER — CEFUROXIME AXETIL 250 MG PO TABS: 500.0000 mg | ORAL_TABLET | Freq: Two times a day (BID) | ORAL | Status: DC

## 2013-05-01 MED ORDER — SODIUM CHLORIDE 0.9 % IN NEBU
INHALATION_SOLUTION | RESPIRATORY_TRACT | Status: AC
  Administered 2013-05-01: 3 mL
  Filled 2013-05-01: qty 3

## 2013-05-01 MED ORDER — LORAZEPAM 0.5 MG PO TABS: 0.5000 mg | ORAL_TABLET | Freq: Two times a day (BID) | ORAL | Status: DC

## 2013-05-01 MED ORDER — CEFUROXIME AXETIL 500 MG PO TABS: 500.0000 mg | ORAL_TABLET | Freq: Two times a day (BID) | ORAL | Status: DC

## 2013-05-01 NOTE — Progress Notes (Signed)
UR chart review completed.  

## 2013-05-01 NOTE — Clinical Social Work Psychosocial (Signed)
    Clinical Social Work Department BRIEF PSYCHOSOCIAL ASSESSMENT 05/01/2013  Patient:  [name] [name], [name] [name]     Account Number:  [id number]     Admit date:  04/28/2013  Clinical Social Worker:  Santa Genera, CLINICAL SOCIAL WORKER  Date/Time:  05/01/2013 01:00 PM  Referred by:  Physician  Date Referred:  04/30/2013 Referred for  SNF Placement   Other Referral:   Interview type:  Family Other interview type:   Also spoke w [facility] admission    PSYCHOSOCIAL DATA Living Status:  FACILITY Admitted from facility:  [facility] Level of care:  Skilled Nursing Facility Primary support name:  Kristopher Oppenheim Primary support relationship to patient:  CHILD, ADULT Degree of support available:   Has good support from his 8 children and lives in SNF    CURRENT CONCERNS Current Concerns  Post-Acute Placement   Other Concerns:    SOCIAL WORK ASSESSMENT / PLAN CSW met w patient's daughter, Kristopher Oppenheim, at bedside. Patient has dementia and is oriented to self only, unable to participate in assessment.  Daughter confirms that patient comes from [facility], placed there in 07/2012 after being resident of [facility]  Family supportive, patient has 8 children, all of whom are working together and in agreement on father's care per [place].  [facility] confirms that they are willing to take patient back at discharge and expressed no concerns about placement.   Assessment/plan status:  Psychosocial Support/Ongoing Assessment of Needs Other assessment/ plan:   Information/referral to community resources:   None needed at this time    PATIENT'S/FAMILY'S RESPONSE TO PLAN OF CARE: Family and facility in agreement on plan to discharge back to [facility].        Santa Genera, LCSW Clinical Social Worker [phone])

## 2013-05-01 NOTE — Clinical Social Work Note (Signed)
Patient ready for discharge today, will return to [facility] via [employer].  Daughter in room and facility admissions both agreeable to discharge plan.  Patient oriented to self only.  Discharge summary faxed to facility via [facility], FL2 reviewed w RN and updated as needed.  Discharge packet prepared and placed w shadow chart for transport.  EMS called for approx 3 PM today.  CSW signing off as no further SW needs identified.  Santa Genera, LCSW Clinical Social Worker [phone])

## 2013-05-01 NOTE — Progress Notes (Signed)
Speech Language Pathology Dysphagia Treatment Patient Details Name: [name] [name] MRN: [id number] DOB: 1931-02-19 Today's Date: 05/01/2013 Time: 8413-2440 SLP Time Calculation (min): 23 min  Assessment / Plan / Recommendation Clinical Impression  Pt seen for f/u today in room SLP provided previously recommended texture of puree/thin liquids. Pt's daughter present in room. Pt stated that he had pain in his neck and shoulders. SLP asked daughter how he was doing with PO intake, and she stated "very well." Pt did very well with pureed texture fed by SLP with spoon presentations. Pt's swallow was timely and effecient, displaying no overt s/sx of aspiration and/or penetration. When given spoon/straw/cup sips of thin liquid, pt presented wet vocal quality and "gurgling" for 3-5 seconds post swallow. It is expected that pt may have penetrated this thin liquid and it was hovering over airway. Provided cues for multiple swallows, pt was able to provide clear vocal quality with clear airway. SLP spoke with daughter regarding nectar liquids as a safer option for pt. She agreed to do whatever was safest. SLP encouraged daughter that as pt got stronger, thin liquids would be reintroduced. Due to pt's weakness and complicated respiratory symptoms, and MBSS may not be the best option at this time. SLP spoke with nursing regarding this downgrade.     Diet Recommendation  Continue with Current Diet: Dysphagia 1 (puree) Initiate / Change Diet: Nectar-thick liquid    SLP Plan Continue with current plan of care   Pertinent Vitals/Pain Pt expressed mod pain in neck and shoulders.   Swallowing Goals  SLP Swallowing Goals Patient will consume recommended diet without observed clinical signs of aspiration with: Supervision/safety Swallow Study Goal #1 - Progress: Progressing toward goal Patient will utilize recommended strategies during swallow to increase swallowing safety with: Supervision/safety Swallow Study  Goal #2 - Progress: Progressing toward goal  General Respiratory Status: Supplemental O2 delivered via (comment) Behavior/Cognition: Alert;Pleasant mood;Cooperative Oral Cavity - Dentition: Missing dentition Patient Positioning: Upright in bed  Oral Cavity - Oral Hygiene Does patient have any of the following "at risk" factors?: Oxygen therapy - cannula, mask, simple oxygen devices   Dysphagia Treatment Treatment focused on: Skilled observation of diet tolerance;Patient/family/caregiver education;Facilitation of oral preparatory phase Family/Caregiver Educated: Spoke with both daughters regarding hierarchy of safe textures. Treatment Methods/Modalities: Skilled observation Patient observed directly with PO's: Yes Type of PO's observed: Dysphagia 1 (puree);Thin liquids;Ice chips Feeding: Total assist Liquids provided via: Teaspoon;Straw;Cup Pharyngeal Phase Signs & Symptoms: Audible swallow;Wet vocal quality Type of cueing: Verbal Amount of cueing: Moderate   GO     [name] [name] S 05/01/2013, 10:36 AM

## 2013-05-01 NOTE — Discharge Summary (Signed)
Physician Discharge Summary  ATA PECHA ZOX:[id number] DOB: [birth year] DOA: 04/28/2013  PCP: Pearson Grippe, MD  Admit date: 04/28/2013 Discharge date: 05/01/2013  Recommendations for Outpatient Follow-up:  1. Followup resolution of UTI 2. Followup dysphagia: Dysphagia 1 diet, nectar thick liquids. Recommend ongoing speech therapy as an outpatient.  Pending studies:  Blood cultures  Follow-up Information   Follow up with Pearson Grippe, MD In 1 week.   Specialty:  Internal Medicine     Discharge Diagnoses:  1. UTI, possible early sepsis 2. Dysphagia 3. Chronic respiratory failure 4. Chronic abdominal pain 5. Diabetes mellitus 6. Dementia  Discharge Condition: Improved Disposition: Return to [facility]  Diet recommendation: Dysphagia 1 diet, nectar thick liquids. Assistance necessary for feeding, cueing for compensatory strategies. Double swallow liquids. Slow rate of eating. Small bites/sips.  Filed Weights   04/28/13 2100  Weight: 78.9 kg (173 lb 15.1 oz)    History of present illness:  79 year old man presented to the emergency department with fever and complaints of shortness of breath. Fever was suspected to be secondary to UTI, less likely pulmonary source. He was started on empiric antibiotics and continued on chronic oxygen therapy.  Hospital Course:  Mr. [name] [name] was admitted for further evaluation of fever. Repeat chest x-ray was unremarkable and respiratory status remained stable. Urine culture was positive and with empiric treatment fever resolved as did abdominal pain. He is tolerating a diet, was evaluated by speech therapy and diet changes were recommended. Chronic comorbidities remain stable. See individual issues below.  1. Fever: Resolved. Presumably secondary to UTI. Possible early sepsis appears to have resolved. Hemodynamic stable. Change to oral antibiotics. No evidence of pneumonia. 2. UTI: Continue treatment as above. 3. Chronic abdominal pain: He has no  abdominal pain on examination. Likely chronic, possibly complicated by UTI. 4. Chronic respiratory failure: Stable. Mild hypercarbia noted, also seen in the past. No evidence of acute decompensation. Continue supplemental oxygen. 5. Diabetes mellitus: Capillary blood sugars stable. Continue sliding scale insulin. Hemoglobin A1c 6.1. Resume metformin. 6. Dementia: stable. Continue Aricept. TSH normal. 7. History of seizure disorder: Stable. Continue Keppra, Depakote. 8. Dysphagia 1 diet, nectar thick liquids. Assistance necessary for feeding, cueing for compensatory strategies. Double swallow liquids. Slow rate of eating. Small bites/sips.  Consultants:  Speech therapy: Dysphagia 1 diet, nectar thick liquids  Per physical therapy patient is at baseline Procedures: None Antibiotics:  Cefepime 8/19 >> 8/22  Vancomycin 8/19 >> 8/21  Ceftin 8/22 >> 8/25  Discharge Instructions     Medication List         albuterol (2.5 MG/3ML) 0.083% nebulizer solution  Commonly known as:  PROVENTIL  Take 2.5 mg by nebulization 4 (four) times daily.     ALPHAGAN P 0.1 % Soln  Generic drug:  brimonidine  Place 1 drop into both eyes 2 (two) times daily.     amLODipine 5 MG tablet  Commonly known as:  NORVASC  Take 1 tablet (5 mg total) by mouth daily.     cefUROXime 500 MG tablet  Commonly known as:  CEFTIN  Take 1 tablet (500 mg total) by mouth 2 (two) times daily with a meal. Last dose 8/25 in the evening.     citalopram 10 MG tablet  Commonly known as:  CELEXA  Take 10 mg by mouth daily.     divalproex 125 MG capsule  Commonly known as:  DEPAKOTE SPRINKLE  Take 500 mg by mouth 3 (three) times daily.     DOC-Q-LAX 8.6-50 MG  per tablet  Generic drug:  senna-docusate  Take 1 tablet by mouth 2 (two) times daily.     donepezil 10 MG tablet  Commonly known as:  ARICEPT  Take 10 mg by mouth at bedtime.     Dorzolamide HCl-Timolol Mal PF 22.3-6.8 MG/ML Soln  Apply 1 drop to eye 2 (two)  times daily.     feeding supplement Liqd  Take 1 Container by mouth 2 (two) times daily between meals.     latanoprost 0.005 % ophthalmic solution  Commonly known as:  XALATAN  Place 1 drop into the left eye at bedtime.     levETIRAcetam 250 MG tablet  Commonly known as:  KEPPRA  Take 1 tablet (250 mg total) by mouth every 12 (twelve) hours.     LORazepam 0.5 MG tablet  Commonly known as:  ATIVAN  Take 1 tablet (0.5 mg total) by mouth 2 (two) times daily.     lovastatin 40 MG tablet  Commonly known as:  MEVACOR  Take 40 mg by mouth at bedtime.     metFORMIN 500 MG tablet  Commonly known as:  GLUCOPHAGE  Take 1 tablet (500 mg total) by mouth 2 (two) times daily with a meal.     multivitamin with minerals tablet  Take 1 tablet by mouth daily.     omeprazole 20 MG capsule  Commonly known as:  PRILOSEC  Take 20 mg by mouth daily.     pilocarpine 1 % ophthalmic solution  Commonly known as:  PILOCAR  Place 1 drop into both eyes 4 (four) times daily.     polyethylene glycol packet  Commonly known as:  MIRALAX / GLYCOLAX  Take 17 g by mouth daily.     simethicone 80 MG chewable tablet  Commonly known as:  MYLICON  Chew 1 tablet (80 mg total) by mouth 4 (four) times daily as needed for flatulence.     SINGULAIR 10 MG tablet  Generic drug:  montelukast  Take 10 mg by mouth every morning.     Vitamin D (Ergocalciferol) 50000 UNITS Caps capsule  Commonly known as:  DRISDOL  Take 50,000 Units by mouth every Tuesday.       No Known Allergies  The results of significant diagnostics from this hospitalization (including imaging, microbiology, ancillary and laboratory) are listed below for reference.    Significant Diagnostic Studies: Dg Chest 2 View  04/29/2013   *RADIOLOGY REPORT*  Clinical Data: Shortness of breath, weakness, follow-up; history smoking, diabetes  CHEST - 2 VIEW  Comparison: 04/28/2013 Correlation:  CT chest 04/16/2012  Findings: Upper normal heart size.  Mediastinal contours and pulmonary vascularity normal. Emphysematous changes without infiltrate, pleural effusion or pneumothorax. Minimal peribronchial thickening. Bilateral pleural plaque disease, left greater than right, unchanged; observed plaques demonstrated partial calcification on prior CT, question asbestos exposure. Bones unremarkable.  IMPRESSION: Mild emphysematous changes. Prior asbestos exposure. No acute abnormalities. No interval change.   Original Report Authenticated By: Ulyses Southward, M.D.   Dg Abd 1 View  04/29/2013   *RADIOLOGY REPORT*  Clinical Data: Left lower quadrant pain  ABDOMEN - 1 VIEW  Comparison: CT 02/27/2012  Findings: Limited anatomic detail due to obesity and technique. Normal bowel gas pattern.  Negative for bowel obstruction.  Foley catheter noted.  IMPRESSION: No acute abnormality.   Original Report Authenticated By: Janeece Riggers, M.D.   Dg Chest Port 1 View  04/28/2013   *RADIOLOGY REPORT*  Clinical Data: Rule out infection  PORTABLE CHEST - 1  VIEW  Comparison: 10/17/2012  Findings:  Heart size is normal.  No pleural effusion or edema.  Chronic interstitial change of COPD/emphysema identified.  Pleural plaques are again noted and appears similar to previous exam.  Visualized osseous structures are unremarkable.  IMPRESSION:  1.  No acute findings. 2.  COPD 3.  Pleural plaques compatible with prior asbestos exposure.   Original Report Authenticated By: Signa Kell, M.D.    Microbiology: Recent Results (from the past 240 hour(s))  CULTURE, BLOOD (ROUTINE X 2)     Status: None   Collection Time    04/28/13  5:58 PM      Result Value Range Status   Specimen Description BLOOD LEFT ANTECUBITAL   Final   Special Requests     Final   Value: BOTTLES DRAWN AEROBIC AND ANAEROBIC AEB=15CC ANA=10CC   Culture NO GROWTH 2 DAYS   Final   Report Status PENDING   Incomplete  CULTURE, BLOOD (ROUTINE X 2)     Status: None   Collection Time    04/28/13  6:00 PM      Result  Value Range Status   Specimen Description BLOOD LEFT WRIST   Final   Special Requests BOTTLES DRAWN AEROBIC AND ANAEROBIC 12CC   Final   Culture NO GROWTH 2 DAYS   Final   Report Status PENDING   Incomplete  URINE CULTURE     Status: None   Collection Time    04/28/13  6:10 PM      Result Value Range Status   Specimen Description URINE, CATHETERIZED   Final   Special Requests NONE   Final   Culture  Setup Time     Final   Value: 04/28/2013 18:40     Performed at [facility] Count     Final   Value: 3,000 COLONIES/ML     Performed at [facility]   Culture     Final   Value: ESCHERICHIA COLI     Performed at [facility]   Report Status 04/30/2013 FINAL   Final   Organism ID, Bacteria ESCHERICHIA COLI   Final     Labs: Basic Metabolic Panel:  Recent Labs Lab 04/28/13 1719 04/29/13 0317 04/30/13 0510 05/01/13 0441  NA 137 137 138 139  K 4.2 4.3 4.1 4.4  CL 98 101 99 100  CO2 32 32 34* 35*  GLUCOSE 103* 122* 103* 96  BUN 16 13 11 13   CREATININE 1.15 1.15 1.06 0.99  CALCIUM 10.6* 9.4 10.2 10.1   Liver Function Tests:  Recent Labs Lab 04/28/13 1719 04/29/13 0317  AST 18 16  ALT 13 11  ALKPHOS 66 55  BILITOT 0.2* 0.2*  PROT 7.8 6.7  ALBUMIN 3.3* 2.7*   CBC:  Recent Labs Lab 04/28/13 1719 04/29/13 0317 04/30/13 0510  WBC 11.7* 9.5 9.3  NEUTROABS 7.5  --   --   HGB 10.8* 9.5* 10.0*  HCT 35.7* 31.2* 32.4*  MCV 92.5 91.8 92.3  PLT 194 147* 170   Cardiac Enzymes:  Recent Labs Lab 04/28/13 1719 04/28/13 2038 04/29/13 0317 04/29/13 0811  TROPONINI <0.30 <0.30 <0.30 <0.30   CBG:  Recent Labs Lab 04/30/13 0726 04/30/13 1151 04/30/13 1651 04/30/13 2106 05/01/13 0719  GLUCAP 125* 87 184* 137* 83    Principal Problem:   Fever Active Problems:   GLAUCOMA   HYPERTENSION   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

## 2013-05-01 NOTE — Progress Notes (Signed)
Report given to Management consultant at [employer].  She verbalized understanding.  Pt was transferred back to [facility] by [facility] staff in stable condition, with his packet.  He voided x1 a small amount post foley.  Dahlia Client was made aware. I told her to call me with any further questions.

## 2013-05-01 NOTE — Progress Notes (Signed)
[facility] PROGRESS NOTE  [name] [name] WGN:[id number] DOB: [birth year] DOA: 04/28/2013 PCP: Pearson Grippe, MD  Assessment/Plan: 1. Fever: Resolved. Presumably secondary to UTI. Possible early sepsis appears to have resolved. Hemodynamic stable. Change to oral antibiotics. No evidence of pneumonia. 2. UTI: Continue treatment as above. 3. Chronic abdominal pain: He has no abdominal pain on examination. Likely chronic, possibly complicated by UTI. 4. Chronic respiratory failure: Stable. Mild hypercarbia noted, also seen in the past. No evidence of acute decompensation. Continue supplemental oxygen. 5. Diabetes mellitus: Capillary blood sugars stable. Continue sliding scale insulin. Hemoglobin A1c 6.1. Appears to be diet controlled. 6. Dementia:  stable. Continue Aricept. TSH normal. 7. History of seizure disorder: Stable. Continue Keppra, Depakote. 8. Dysphagia 1 diet, nectar thick liquids. Assistance necessary for feeding, cueing for compensatory strategies. Double swallow liquids. Slow rate of eating. Small bites/sips.   Change to oral antibiotics.  Discharge to [facility] today  Pending studies:   Blood cultures  Code Status: Full code DVT prophylaxis: Lovenox Family Communication: None present. Discussed with daughter Hector Shade by telephone. Updated on care and plan for discharge. Disposition Plan: Return to [facility]   Brendia Sacks, MD  [facility]  Pager [phone] If 7PM-7AM, please contact night-coverage at [email or web address], password [facility] 05/01/2013, 11:36 AM  LOS: 3 days   Clinical Summary: 79 year old man presented to the emergency department with fever and complaints of shortness of breath. Fever was suspected to be secondary to UTI, less likely pulmonary source. He was started on empiric antibiotics and continued on chronic oxygen therapy.  Consultants:  Speech therapy: Dysphagia 1 diet, nectar thick liquids  Per physical therapy patient  is  Procedures:    Antibiotics:  Cefepime 8/19 >> 8/22  Vancomycin 8/19 >> 8/21  Ceftin 8/22 >> 8/25  HPI/Subjective: No complaints. Breathing fine. No nausea, vomiting. Eating fine. No pain.  Objective: Filed Vitals:   04/30/13 2114 05/01/13 0532 05/01/13 0737 05/01/13 1103  BP: 118/78 173/72    Pulse: 58 93    Temp: 99.1 F (37.3 C) 98.4 F (36.9 C)    TempSrc: Oral Axillary    Resp: 20 20    Height:      Weight:      SpO2: 94% 99% 97% 97%    Intake/Output Summary (Last 24 hours) at 05/01/13 1136 Last data filed at 05/01/13 0856  Gross per 24 hour  Intake    800 ml  Output   1350 ml  Net   -550 ml     Filed Weights   04/28/13 2100  Weight: 78.9 kg (173 lb 15.1 oz)    Exam:   Afebrile >36 hours. Vitals stable. Hypoxia stable.  General: Appears calm and comfortable.  Psychiatric: Grossly normal mood and affect. Speech fluent and appropriate. Alert. Oriented to self, any pain hospital, month.  Cardiovascular: Regular rate and rhythm. No murmur, rub, gallop. No lower extremity edema. Feet warm and dry. Nontender.  Respiratory: Clear to auscultation bilaterally. No wheezes, rales, rhonchi. Normal respiratory effort.  Abdomen: Soft, nontender, nondistended. No masses appreciated.  Skin: Appears grossly unremarkable.  Musculoskeletal: Appears grossly unremarkable.  Data Reviewed:  Basic metabolic panel unremarkable  Urine culture noted  Scheduled Meds: . albuterol  2.5 mg Nebulization QID  . amLODipine  5 mg Oral Daily  . brimonidine  1 drop Both Eyes BID  . ceFEPime (MAXIPIME) IV  1 g Intravenous Q12H  . citalopram  10 mg Oral Daily  . divalproex  500 mg Oral TID  .  donepezil  10 mg Oral QHS  . dorzolamide-timolol  1 drop Both Eyes BID  . enoxaparin (LOVENOX) injection  40 mg Subcutaneous Q24H  . feeding supplement  1 Container Oral BID BM  . insulin aspart  0-9 Units Subcutaneous TID WC  . latanoprost  1 drop Left Eye QHS  .  levETIRAcetam  250 mg Oral Q12H  . LORazepam  0.5 mg Oral BID  . montelukast  10 mg Oral q morning - 10a  . pantoprazole  40 mg Oral Daily  . pilocarpine  1 drop Both Eyes QID  . polyethylene glycol  17 g Oral Daily  . simvastatin  20 mg Oral q1800  . 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

## 2013-05-03 LAB — CULTURE, BLOOD (ROUTINE X 2)

## 2014-02-11 IMAGING — CR DG CHEST 1V PORT
1 series · 1 of 1 positions shown · non-contrast
Comparison: 06/12/2012, 04/16/2012

CLINICAL DATA: Altered mental status

PORTABLE CHEST - 1 VIEW

[view not recorded]
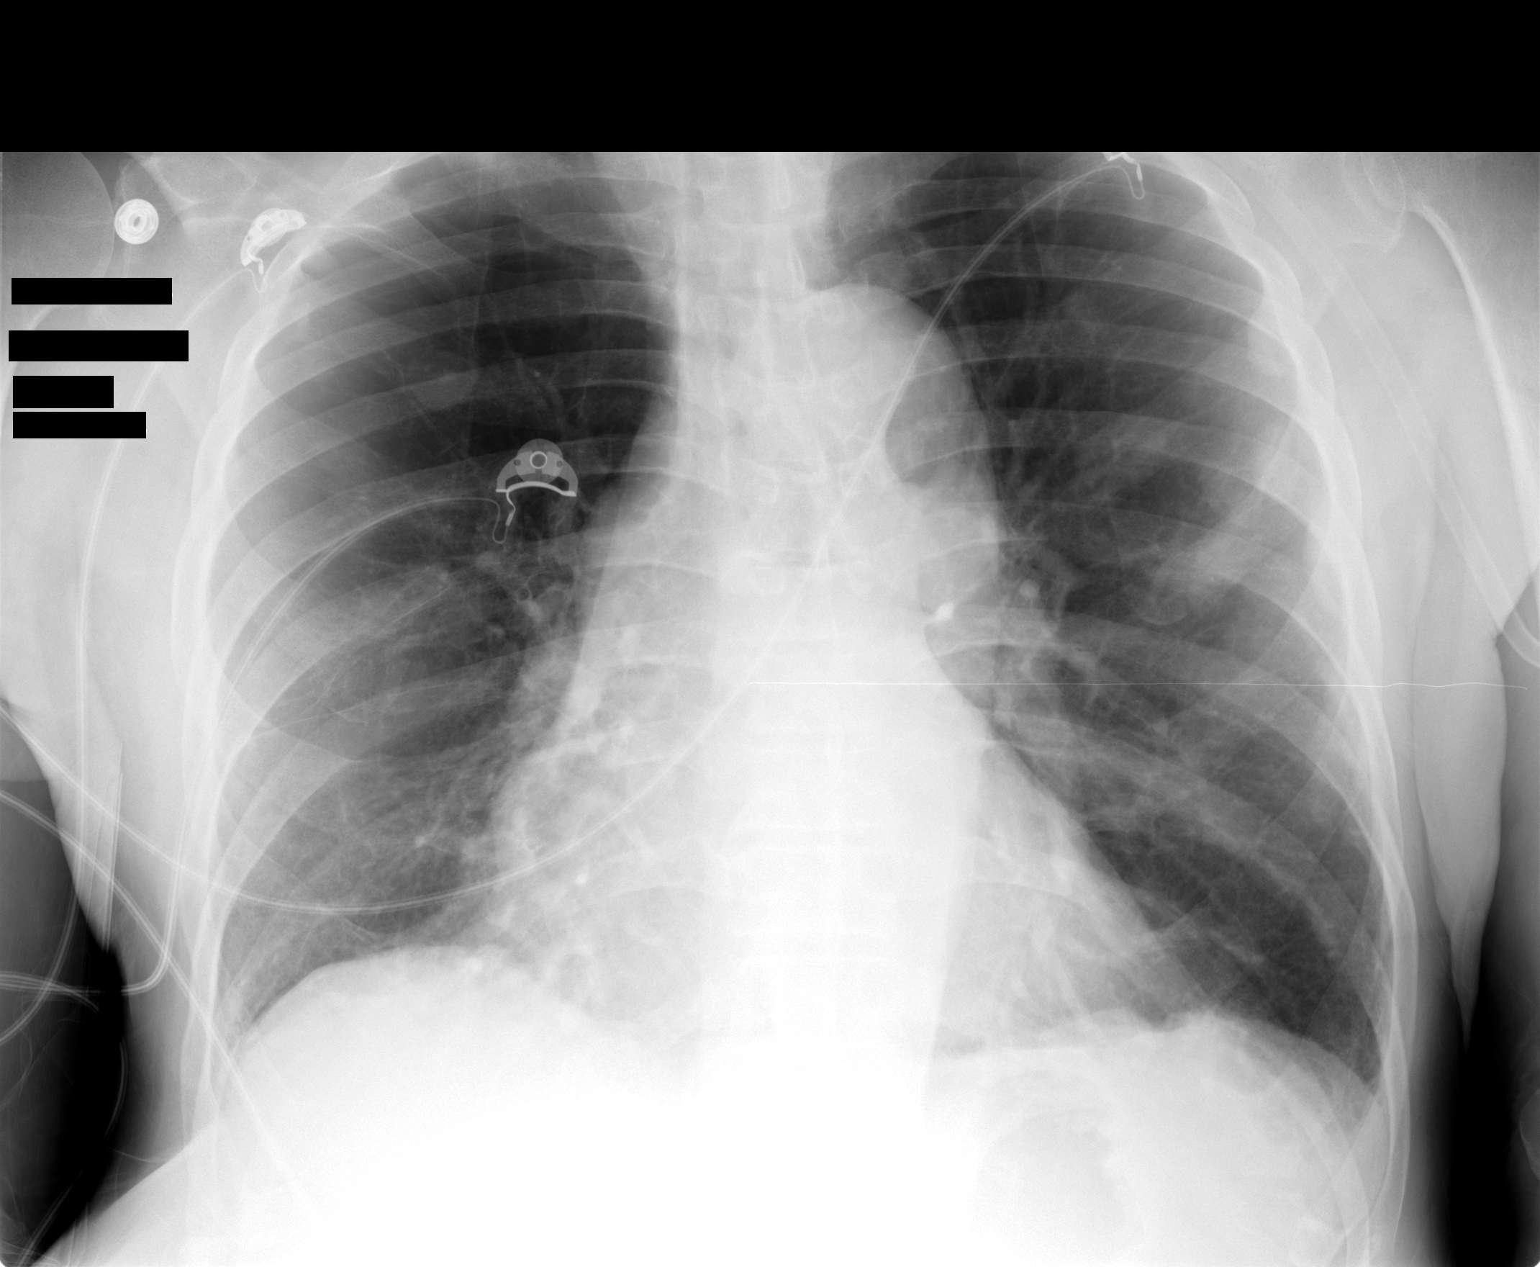

[1 of 1 positions shown; findings below may reference images not displayed]

FINDINGS: Background hyperinflation compatible with emphysema.
Mild cardiac enlarged without CHF.  Nodular densities bilaterally,
more pronounced on the left side.  These correlate with numerous
bilateral pleural nodules by chest CT comparison.  Suspect related
to asbestos pleural disease.  Negative for focal pneumonia,
collapse, consolidation, edema, effusion or pneumothorax.  Trachea
midline.
IMPRESSION: Cardiomegaly without CHF or pneumonia

Bilateral nodular densities demonstrated to be pleural nodules by
prior chest CT.  See above comment.

## 2014-02-24 ENCOUNTER — Encounter (HOSPITAL_COMMUNITY): Payer: Self-pay | Admitting: Emergency Medicine

## 2014-02-24 ENCOUNTER — Emergency Department (HOSPITAL_COMMUNITY): Payer: PRIVATE HEALTH INSURANCE

## 2014-02-24 ENCOUNTER — Inpatient Hospital Stay (HOSPITAL_COMMUNITY)
Admission: EM | Admit: 2014-02-24 | Discharge: 2014-02-27 | DRG: 189 | Disposition: A | Discharge status: Skilled Nursing Facility | Payer: PRIVATE HEALTH INSURANCE | Attending: Internal Medicine | Admitting: Internal Medicine

## 2014-02-24 DIAGNOSIS — R8290 Unspecified abnormal findings in urine: Secondary | ICD-10-CM

## 2014-02-24 DIAGNOSIS — J9620 Acute and chronic respiratory failure, unspecified whether with hypoxia or hypercapnia: Principal | ICD-10-CM | POA: Diagnosis present

## 2014-02-24 DIAGNOSIS — F0392 Unspecified dementia, unspecified severity, with psychotic disturbance: Secondary | ICD-10-CM

## 2014-02-24 DIAGNOSIS — F0393 Unspecified dementia, unspecified severity, with mood disturbance: Secondary | ICD-10-CM | POA: Diagnosis present

## 2014-02-24 DIAGNOSIS — R0602 Shortness of breath: Secondary | ICD-10-CM

## 2014-02-24 DIAGNOSIS — R4182 Altered mental status, unspecified: Secondary | ICD-10-CM

## 2014-02-24 DIAGNOSIS — I10 Essential (primary) hypertension: Secondary | ICD-10-CM

## 2014-02-24 DIAGNOSIS — F411 Generalized anxiety disorder: Secondary | ICD-10-CM

## 2014-02-24 DIAGNOSIS — Z803 Family history of malignant neoplasm of breast: Secondary | ICD-10-CM

## 2014-02-24 DIAGNOSIS — D72829 Elevated white blood cell count, unspecified: Secondary | ICD-10-CM

## 2014-02-24 DIAGNOSIS — F0390 Unspecified dementia without behavioral disturbance: Secondary | ICD-10-CM | POA: Diagnosis present

## 2014-02-24 DIAGNOSIS — H409 Unspecified glaucoma: Secondary | ICD-10-CM

## 2014-02-24 DIAGNOSIS — E119 Type 2 diabetes mellitus without complications: Secondary | ICD-10-CM

## 2014-02-24 DIAGNOSIS — D649 Anemia, unspecified: Secondary | ICD-10-CM

## 2014-02-24 DIAGNOSIS — F22 Delusional disorders: Secondary | ICD-10-CM

## 2014-02-24 DIAGNOSIS — R569 Unspecified convulsions: Secondary | ICD-10-CM

## 2014-02-24 DIAGNOSIS — R918 Other nonspecific abnormal finding of lung field: Secondary | ICD-10-CM

## 2014-02-24 DIAGNOSIS — H9190 Unspecified hearing loss, unspecified ear: Secondary | ICD-10-CM | POA: Diagnosis present

## 2014-02-24 DIAGNOSIS — G8929 Other chronic pain: Secondary | ICD-10-CM | POA: Diagnosis present

## 2014-02-24 DIAGNOSIS — J9602 Acute respiratory failure with hypercapnia: Secondary | ICD-10-CM | POA: Diagnosis present

## 2014-02-24 DIAGNOSIS — R05 Cough: Secondary | ICD-10-CM

## 2014-02-24 DIAGNOSIS — E118 Type 2 diabetes mellitus with unspecified complications: Secondary | ICD-10-CM

## 2014-02-24 DIAGNOSIS — R109 Unspecified abdominal pain: Secondary | ICD-10-CM

## 2014-02-24 DIAGNOSIS — M549 Dorsalgia, unspecified: Secondary | ICD-10-CM

## 2014-02-24 DIAGNOSIS — R5383 Other fatigue: Secondary | ICD-10-CM

## 2014-02-24 DIAGNOSIS — Z66 Do not resuscitate: Secondary | ICD-10-CM | POA: Diagnosis present

## 2014-02-24 DIAGNOSIS — E785 Hyperlipidemia, unspecified: Secondary | ICD-10-CM

## 2014-02-24 DIAGNOSIS — E872 Acidosis, unspecified: Secondary | ICD-10-CM | POA: Diagnosis present

## 2014-02-24 DIAGNOSIS — Z801 Family history of malignant neoplasm of trachea, bronchus and lung: Secondary | ICD-10-CM

## 2014-02-24 DIAGNOSIS — R059 Cough, unspecified: Secondary | ICD-10-CM

## 2014-02-24 DIAGNOSIS — R0902 Hypoxemia: Secondary | ICD-10-CM

## 2014-02-24 DIAGNOSIS — R001 Bradycardia, unspecified: Secondary | ICD-10-CM

## 2014-02-24 DIAGNOSIS — G40909 Epilepsy, unspecified, not intractable, without status epilepticus: Secondary | ICD-10-CM

## 2014-02-24 DIAGNOSIS — F05 Delirium due to known physiological condition: Secondary | ICD-10-CM

## 2014-02-24 DIAGNOSIS — G9340 Encephalopathy, unspecified: Secondary | ICD-10-CM | POA: Diagnosis present

## 2014-02-24 DIAGNOSIS — J309 Allergic rhinitis, unspecified: Secondary | ICD-10-CM

## 2014-02-24 DIAGNOSIS — J449 Chronic obstructive pulmonary disease, unspecified: Secondary | ICD-10-CM | POA: Diagnosis present

## 2014-02-24 DIAGNOSIS — G4700 Insomnia, unspecified: Secondary | ICD-10-CM

## 2014-02-24 DIAGNOSIS — Z87891 Personal history of nicotine dependence: Secondary | ICD-10-CM

## 2014-02-24 DIAGNOSIS — N390 Urinary tract infection, site not specified: Secondary | ICD-10-CM

## 2014-02-24 DIAGNOSIS — R0689 Other abnormalities of breathing: Secondary | ICD-10-CM

## 2014-02-24 DIAGNOSIS — R7303 Prediabetes: Secondary | ICD-10-CM

## 2014-02-24 DIAGNOSIS — K219 Gastro-esophageal reflux disease without esophagitis: Secondary | ICD-10-CM | POA: Diagnosis present

## 2014-02-24 DIAGNOSIS — R410 Disorientation, unspecified: Secondary | ICD-10-CM

## 2014-02-24 DIAGNOSIS — R5381 Other malaise: Secondary | ICD-10-CM

## 2014-02-24 DIAGNOSIS — J4489 Other specified chronic obstructive pulmonary disease: Secondary | ICD-10-CM | POA: Diagnosis present

## 2014-02-24 DIAGNOSIS — IMO0002 Reserved for concepts with insufficient information to code with codable children: Secondary | ICD-10-CM

## 2014-02-24 LAB — COMPREHENSIVE METABOLIC PANEL
ALT: 5 U/L (ref 0–53)
AST: 9 U/L (ref 0–37)
Albumin: 3.1 g/dL — ABNORMAL LOW (ref 3.5–5.2)
Alkaline Phosphatase: 66 U/L (ref 39–117)
BUN: 15 mg/dL (ref 6–23)
CALCIUM: 9.9 mg/dL (ref 8.4–10.5)
CHLORIDE: 93 meq/L — AB (ref 96–112)
CO2: 44 mEq/L (ref 19–32)
Creatinine, Ser: 0.88 mg/dL (ref 0.50–1.35)
GFR, EST NON AFRICAN AMERICAN: 79 mL/min — AB (ref >90)
GLUCOSE: 117 mg/dL — AB (ref 70–99)
Potassium: 4.4 mEq/L (ref 3.7–5.3)
SODIUM: 141 meq/L (ref 137–147)
Total Bilirubin: <0.2 mg/dL — ABNORMAL LOW (ref 0.3–1.2)
Total Protein: 7.4 g/dL (ref 6.0–8.3)

## 2014-02-24 LAB — URINALYSIS, ROUTINE W REFLEX MICROSCOPIC
BILIRUBIN URINE: NEGATIVE
Glucose, UA: NEGATIVE mg/dL
HGB URINE DIPSTICK: NEGATIVE
Leukocytes, UA: NEGATIVE
Nitrite: NEGATIVE
PH: 6 (ref 5.0–8.0)
Protein, ur: NEGATIVE mg/dL
Specific Gravity, Urine: 1.025 (ref 1.005–1.030)
Urobilinogen, UA: 0.2 mg/dL (ref 0.0–1.0)

## 2014-02-24 LAB — BLOOD GAS, ARTERIAL
ACID-BASE EXCESS: 13.4 mmol/L — AB (ref 0.0–2.0)
BICARBONATE: 41.6 meq/L — AB (ref 20.0–24.0)
Drawn by: 22223
O2 Content: 3 L/min
O2 SAT: 98.1 %
PATIENT TEMPERATURE: 37
PO2 ART: 131 mmHg — AB (ref 80.0–100.0)
TCO2: 40 mmol/L (ref 0–100)
pCO2 arterial: 110 mmHg (ref 35.0–45.0)
pH, Arterial: 7.202 — ABNORMAL LOW (ref 7.350–7.450)

## 2014-02-24 LAB — PROTIME-INR
INR: 0.96 (ref 0.00–1.49)
PROTHROMBIN TIME: 12.6 s (ref 11.6–15.2)

## 2014-02-24 LAB — VALPROIC ACID LEVEL: Valproic Acid Lvl: 49.5 ug/mL — ABNORMAL LOW (ref 50.0–100.0)

## 2014-02-24 LAB — CBC WITH DIFFERENTIAL/PLATELET
BASOS ABS: 0 10*3/uL (ref 0.0–0.1)
Basophils Relative: 0 % (ref 0–1)
EOS PCT: 3 % (ref 0–5)
Eosinophils Absolute: 0.2 10*3/uL (ref 0.0–0.7)
HCT: 36.9 % — ABNORMAL LOW (ref 39.0–52.0)
Hemoglobin: 10.3 g/dL — ABNORMAL LOW (ref 13.0–17.0)
LYMPHS ABS: 1.9 10*3/uL (ref 0.7–4.0)
LYMPHS PCT: 28 % (ref 12–46)
MCH: 27.2 pg (ref 26.0–34.0)
MCHC: 27.9 g/dL — ABNORMAL LOW (ref 30.0–36.0)
MCV: 97.4 fL (ref 78.0–100.0)
MONO ABS: 0.6 10*3/uL (ref 0.1–1.0)
Monocytes Relative: 10 % (ref 3–12)
NEUTROS ABS: 4.1 10*3/uL (ref 1.7–7.7)
Neutrophils Relative %: 59 % (ref 43–77)
Platelets: 158 10*3/uL (ref 150–400)
RBC: 3.79 MIL/uL — ABNORMAL LOW (ref 4.22–5.81)
RDW: 15.4 % (ref 11.5–15.5)
WBC: 6.8 10*3/uL (ref 4.0–10.5)

## 2014-02-24 LAB — AMMONIA: Ammonia: 47 umol/L (ref 11–60)

## 2014-02-24 LAB — TROPONIN I: Troponin I: <0.3 ng/mL (ref <0.30)

## 2014-02-24 MED ORDER — SODIUM CHLORIDE 0.9 % IV BOLUS (SEPSIS)
1000.0000 mL | Freq: Once | INTRAVENOUS | Status: AC
  Administered 2014-02-24: 1000 mL via INTRAVENOUS

## 2014-02-24 NOTE — ED Provider Notes (Signed)
CSN: [id number]     Arrival date & time 02/24/14  2003 History  This chart was scribed for Ezequiel Essex, MD by Lowella Petties, ED Scribe. The patient was seen in room [facility]2/[facility]2. Patient's care was started at 8:13 PM.  Chief Complaint  Patient presents with  . Altered Mental Status   Level 5 Caveat: Dementia The history is provided by a relative. The history is limited by the condition of the patient. No language interpreter was used.  HPI Comments: [name] [name] is a 80 y.o. male with history of COPD and dementia who presents to the Emergency Department with an altered mental status 2.5 weeks ago, worsened today. Relative states that during that 2.5 weeks he has been sleeping more than usual. Relative states that patient has been complaining of abdominal pain. Relative states that he has been in and out and that today during dinner he became unresponsive to shaking and his name. Relative states that patient after home care nurse was able to rouse him, the patient stated that he was cold and ate a sandwich. Relative states that after 30 min he became unresponsive again as he remains currently. Relative states patient does not normally hold his head locked in one position as he is currently. Relative states patient was patient last fully alert yesterday. Relative states he has a history of seizures last in 2013-2014, denies any recent. Relative states seizures are controlled w/ medication. Relative denies that he takes blood thinners. Patient has a history of hospitalization for UTI and kidney issues. He is on 02 at home for history of COPD. Relative states that recently patient has had a cough productive of sputum that she has had to clear from his mouth. Patient has a history of glaucoma and hearing loss. Relative denies recent falls, head trauma, or any other medical problems. Relative states patient is DNR.   Patient is alert and oriented x2 after turning up hearing aid. Patient denies any  pain but reports shaking.   PCP: [name], MD  Past Medical History  Diagnosis Date  . Glaucoma   . Anxiety   . GERD (gastroesophageal reflux disease)   . Hyperlipidemia   . Hypertension   . Chronic abdominal pain   . 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 Chronic respiratory failure   . Altered mental status 06/12/2012; 08/14/2012    Unwitnessed isolated seizure suspected, but because of the EEG being normal, Dr. Merlene Laughter did not recommend antiseizure therapy.; "violent since 08/13/2012" (08/14/2012)  . Hard of hearing   . Seizure 06/14/2012    Isolated seizure suspected, but not confirmed.  . Bradycardia   . Complication of anesthesia 2005    "couldn't get him woke up when he should have after eye surgery" (08/14/2012)  . Mesothelioma   . Pleural plaque 04/2012  . Exertional dyspnea   . DM type 2 (diabetes mellitus, type 2) 06/13/2012  . UTI (lower urinary tract infection)    Past Surgical History  Procedure Laterality Date  . Inguinal hernia repair    . Prostate surgery    . Left eye surgery for glaucoma at [facility]  10/2009  . Cataract extraction w/ intraocular lens implant  2005    "left" (08/14/2012)   Family History  Problem Relation Age of Onset  . Lung cancer Sister   . Breast cancer Sister    History  Substance Use Topics  . Smoking status: Former Smoker -- 2.00 packs/day for 40 years  Types: Cigarettes  . Smokeless tobacco: Never Used     Comment: "quit smoking cigarettes ~ 2001"  . Alcohol Use: Yes     Comment: 08/14/2012 "used to drink heavily; stopped ~ 2001"    Review of Systems  Unable to perform ROS: Dementia  Level 5 Caveat: Dementia  Allergies  Review of patient's allergies indicates no known allergies.  Home Medications   Prior to Admission medications   Medication Sig Start Date End Date Taking? Authorizing Provider  acetaminophen (TYLENOL) 650 MG CR tablet Take 650 mg by mouth 2 (two) times daily.   Yes Historical  Provider, MD  albuterol (PROVENTIL) (2.5 MG/3ML) 0.083% nebulizer solution Take 2.5 mg by nebulization 4 (four) times daily. 08/26/12  Yes Modena Jansky, MD  amLODipine (NORVASC) 5 MG tablet Take 1 tablet (5 mg total) by mouth daily. 08/26/12  Yes Modena Jansky, MD  brimonidine (ALPHAGAN P) 0.1 % SOLN Place 1 drop into both eyes 2 (two) times daily.    Yes Historical Provider, MD  citalopram (CELEXA) 10 MG tablet Take 10 mg by mouth daily.   Yes Historical Provider, MD  divalproex (DEPAKOTE SPRINKLE) 125 MG capsule Take 500 mg by mouth 3 (three) times daily.   Yes Historical Provider, MD  donepezil (ARICEPT) 10 MG tablet Take 10 mg by mouth at bedtime.   Yes Historical Provider, MD  Dorzolamide HCl-Timolol Mal PF 22.3-6.8 MG/ML SOLN Place 1 drop into the right eye 2 (two) times daily.    Yes Historical Provider, MD  ferrous sulfate 325 (65 FE) MG EC tablet Take 325 mg by mouth 2 (two) times daily.   Yes Historical Provider, MD  latanoprost (XALATAN) 0.005 % ophthalmic solution Place 1 drop into the right eye at bedtime.    Yes Historical Provider, MD  levETIRAcetam (KEPPRA) 250 MG tablet Take 250 mg by mouth 2 (two) times daily.   Yes Historical Provider, MD  loratadine (CLARITIN) 10 MG tablet Take 10 mg by mouth daily.   Yes Historical Provider, MD  LORazepam (ATIVAN) 0.5 MG tablet Take 1 tablet (0.5 mg total) by mouth 2 (two) times daily. 05/01/13  Yes Samuella Cota, MD  lovastatin (MEVACOR) 40 MG tablet Take 40 mg by mouth at bedtime.     Yes Historical Provider, MD  metFORMIN (GLUCOPHAGE) 500 MG tablet Take 500 mg by mouth 2 (two) times daily with a meal.   Yes Historical Provider, MD  montelukast (SINGULAIR) 10 MG tablet Take 10 mg by mouth every morning.    Yes Historical Provider, MD  omeprazole (PRILOSEC) 20 MG capsule Take 20 mg by mouth daily.   Yes Historical Provider, MD  pilocarpine (PILOCAR) 1 % ophthalmic solution Place 1 drop into the right eye 4 (four) times daily.  07/14/12   Yes Historical Provider, MD  polyethylene glycol (MIRALAX / GLYCOLAX) packet Take 17 g by mouth daily. 08/26/12  Yes Modena Jansky, MD  senna-docusate (DOC-Q-LAX) 8.6-50 MG per tablet Take 1 tablet by mouth 2 (two) times daily.   Yes Historical Provider, MD  vitamin C (ASCORBIC ACID) 500 MG tablet Take 500 mg by mouth 2 (two) times daily.   Yes Historical Provider, MD   Triage Vitals: BP 114/63  Pulse 78  Resp 16  Ht 5\' 10"  (1.778 m)  Wt 195 lb (88.451 kg)  BMI 27.98 kg/m2  SpO2 100% Physical Exam  Nursing note and vitals reviewed. Constitutional: He appears well-developed and well-nourished. No distress.  Obtunded. Non verbal. Follows some  commands. Moans, does not make any words. No evidence of trauma.   HENT:  Head: Normocephalic and atraumatic.  Mouth/Throat: Oropharynx is clear and moist. Mucous membranes are dry. No oropharyngeal exudate.  Protecting airway.   Eyes: Conjunctivae and EOM are normal.  Left pupil 3 mm non reactive. Right is 2 mm non reactive.  Neck: Normal range of motion. Neck supple.  No meningismus.   Cardiovascular: Normal rate, regular rhythm, normal heart sounds and intact distal pulses.   No murmur heard. Pulmonary/Chest: Effort normal. No respiratory distress. He has decreased breath sounds.  Abdominal: Soft. He exhibits distension. There is no tenderness. There is no rebound and no guarding.  Musculoskeletal: Normal range of motion. He exhibits no edema and no tenderness.  Neurological: No cranial nerve deficit. He exhibits normal muscle tone. Coordination normal.  No ataxia on finger to nose bilaterally. No pronator drift. 5/5 strength throughout. CN 2-12 intact. Negative Romberg. Equal grip strength. Sensation intact. Gait is normal.      Skin: Skin is warm.  Psychiatric: He has a normal mood and affect. His behavior is normal.    ED Course  Procedures (including critical care time) DIAGNOSTIC STUDIES: Oxygen Saturation is 100% on room air,  normal by my interpretation.    COORDINATION OF CARE: 8:18 PM-Discussed treatment plan which includes CT abdomen  with pt at bedside and pt agreed to plan.   9:01 PM Patient is alert and oriented x2 after turning up hearing aid.  Medications  citalopram (CELEXA) tablet 10 mg (10 mg Oral Given 02/25/14 1004)  divalproex (DEPAKOTE SPRINKLE) capsule 500 mg (500 mg Oral Given 02/25/14 1004)  latanoprost (XALATAN) 0.005 % ophthalmic solution 1 drop (not administered)  donepezil (ARICEPT) tablet 10 mg (not administered)  pilocarpine (PILOCAR) 1 % ophthalmic solution 1 drop (1 drop Right Eye Given 02/25/14 1004)  brimonidine (ALPHAGAN) 0.15 % ophthalmic solution 1 drop (1 drop Both Eyes Given 02/25/14 1000)  levETIRAcetam (KEPPRA) 250 mg in sodium chloride 0.9 % 100 mL IVPB (250 mg Intravenous Given 02/25/14 0325)  insulin aspart (novoLOG) injection 0-15 Units (0 Units Subcutaneous Not Given 02/25/14 0800)  heparin injection 5,000 Units (5,000 Units Subcutaneous Given 02/25/14 0543)  sodium chloride 0.9 % injection 3 mL (3 mLs Intravenous Given 02/25/14 1004)  sodium chloride 0.9 % injection 3 mL (not administered)  0.9 %  sodium chloride infusion (not administered)  acetaminophen (TYLENOL) tablet 650 mg (not administered)    Or  acetaminophen (TYLENOL) suppository 650 mg (not administered)  ondansetron (ZOFRAN) tablet 4 mg (not administered)    Or  ondansetron (ZOFRAN) injection 4 mg (not administered)  albuterol (PROVENTIL) (2.5 MG/3ML) 0.083% nebulizer solution 2.5 mg (not administered)  chlorhexidine (PERIDEX) 0.12 % solution 15 mL (15 mLs Mouth Rinse Given 02/25/14 0859)  antiseptic oral rinse (BIOTENE) solution 15 mL (not administered)  LORazepam (ATIVAN) injection 0.5 mg (0.5 mg Intravenous Given 02/25/14 1004)  dorzolamide-timolol (COSOPT) 22.3-6.8 MG/ML ophthalmic solution 1 drop (not administered)  sodium chloride 0.9 % bolus 1,000 mL (0 mLs Intravenous Stopped 02/25/14 0038)  furosemide  (LASIX) injection 20 mg (20 mg Intravenous Given 02/25/14 0325)   Labs Review Labs Reviewed  CBC WITH DIFFERENTIAL - Abnormal; Notable for the following:    RBC 3.79 (*)    Hemoglobin 10.3 (*)    HCT 36.9 (*)    MCHC 27.9 (*)    All other components within normal limits  COMPREHENSIVE METABOLIC PANEL - Abnormal; Notable for the following:    Chloride 93 (*)  CO2 44 (*)    Glucose, Bld 117 (*)    Albumin 3.1 (*)    Total Bilirubin <0.2 (*)    GFR calc non Af Amer 79 (*)    All other components within normal limits  URINALYSIS, ROUTINE W REFLEX MICROSCOPIC - Abnormal; Notable for the following:    Ketones, ur TRACE (*)    All other components within normal limits  VALPROIC ACID LEVEL - Abnormal; Notable for the following:    Valproic Acid Lvl 49.5 (*)    All other components within normal limits  BLOOD GAS, ARTERIAL - Abnormal; Notable for the following:    pH, Arterial 7.202 (*)    pCO2 arterial 110.0 (*)    pO2, Arterial 131.0 (*)    Bicarbonate 41.6 (*)    Acid-Base Excess 13.4 (*)    All other components within normal limits  BLOOD GAS, ARTERIAL - Abnormal; Notable for the following:    pH, Arterial 7.312 (*)    pCO2 arterial 82.2 (*)    pO2, Arterial 66.8 (*)    Bicarbonate 40.3 (*)    Acid-Base Excess 13.7 (*)    All other components within normal limits  COMPREHENSIVE METABOLIC PANEL - Abnormal; Notable for the following:    Chloride 95 (*)    CO2 43 (*)    Glucose, Bld 121 (*)    Albumin 3.3 (*)    Total Bilirubin 0.2 (*)    GFR calc non Af Amer 82 (*)    All other components within normal limits  CBC - Abnormal; Notable for the following:    RBC 3.89 (*)    Hemoglobin 10.5 (*)    HCT 37.4 (*)    MCHC 28.1 (*)    All other components within normal limits  GLUCOSE, CAPILLARY - Abnormal; Notable for the following:    Glucose-Capillary 110 (*)    All other components within normal limits  GLUCOSE, CAPILLARY - Abnormal; Notable for the following:     Glucose-Capillary 105 (*)    All other components within normal limits  MRSA PCR SCREENING  URINE CULTURE  AMMONIA  TROPONIN I  PROTIME-INR  PRO B NATRIURETIC PEPTIDE  TROPONIN I  GLUCOSE, CAPILLARY  HEMOGLOBIN A1C  I-STAT CG4 LACTIC ACID, ED    Imaging Review Ct Abdomen Pelvis Wo Contrast  02/25/2014   CLINICAL DATA:  Altered mental status.  EXAM: CT ABDOMEN AND PELVIS WITHOUT CONTRAST  TECHNIQUE: Multidetector CT imaging of the abdomen and pelvis was performed following the standard protocol without IV contrast.  COMPARISON:  02/27/2012 CT.  FINDINGS: Bones: No aggressive osseous lesions. Lumbar degenerative disease and Schmorl's nodes. Bilateral hip osteoarthritis.  Lung Bases: Multiple chronic rounded opacities over the hemidiaphragm bilaterally. These appear similar to the prior exam from 2013 although there is motion artifact on today's examination. The origin of these nodules is unclear. Regardless, given the chronicity, these are almost certainly benign and probably related to asbestos related pleural disease.  Liver: Unenhanced CT was performed per clinician order. Lack of IV contrast limits sensitivity and specificity, especially for evaluation of abdominal/pelvic solid viscera. Grossly normal.  Spleen:  Normal.  Gallbladder:  Normal.  Common bile duct:  Normal.  Pancreas:  Normal.  Adrenal glands:  Normal bilaterally.  Kidneys: No renal calculi. No hydronephrosis. Both ureters are within normal limits.  Stomach:  Normal.  Small bowel:  Normal.  Colon:   Normal.  Normal appendix.  Pelvic Genitourinary: Foley catheter. Collapsed urinary bladder. No free fluid.  Vasculature: Atherosclerosis without an acute vascular abnormality.  Body Wall: Fat containing left inguinal hernia. Probable right inguinal herniorrhaphy.  IMPRESSION: 1. No acute abnormality. 2. 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   Electronically  Signed   By: Dereck Ligas M.D.   On: 02/25/2014 00:15   Ct Head Wo Contrast  02/25/2014   CLINICAL DATA:  Altered mental status worsening over last 2 weeks.  EXAM: CT HEAD WITHOUT CONTRAST  TECHNIQUE: Contiguous axial images were obtained from the base of the skull through the vertex without intravenous contrast.  COMPARISON:  10/17/2012.  FINDINGS: No mass lesion, mass effect, midline shift, hydrocephalus, hemorrhage. No acute territorial cortical ischemia/infarct. Atrophy and chronic ischemic white matter disease is present. Small lacune is present in the right basal ganglia. Metal object present along the right lateral globe, unchanged from prior.  IMPRESSION: Atrophy and chronic ischemic white matter disease with old right basal ganglia lacunar infarct. No acute intracranial abnormality.   Electronically Signed   By: Dereck Ligas M.D.   On: 02/25/2014 00:09   Dg Chest Portable 1 View  02/24/2014   CLINICAL DATA:  Dementia.  Bradycardia  EXAM: PORTABLE CHEST - 1 VIEW  COMPARISON:  None.  FINDINGS: Cardiomegaly is present. Pulmonary vascular congestion. Interstitial pulmonary edema. Basilar atelectasis. Monitoring leads project over the chest. Tortuous thoracic aorta appears similar to prior exam. No pleural effusion is identified.  IMPRESSION: Constellation of findings compatible with mild CHF.   Electronically Signed   By: Dereck Ligas M.D.   On: 02/24/2014 21:12     EKG Interpretation   Date/Time:  Wednesday February 24 2014 21:29:36 EDT Ventricular Rate:  78 PR Interval:  195 QRS Duration: 66 QT Interval:  359 QTC Calculation: 409 R Axis:   65 Text Interpretation:  Sinus rhythm Probable anteroseptal infarct, old  Minimal ST elevation, inferior leads stable ST elevation v4, v5 Confirmed  by Wyvonnia Dusky  MD, STEPHEN [phone]) on 02/24/2014 10:03:00 PM      MDM   Final diagnoses:  CO2 narcosis  Altered mental status   patient from nursing home with decreased mental status over the past  one day. He is oriented x1 follows some commands. Daughter reports he is DO NOT RESUSCITATE. No reports of fever  Patient with CO2 narcosis with hypercapnia and respiratory acidosis. He is placed on BiPAP. PCO2 110 with pH 7.2. Place a paced on BiPAP.  Patient has become more alert after his hearing aids were turned on. He is oriented x1 and following some commands.   Stable ST elevation in V4 and V5 discussed with cardiology. No evidence of acute MI.  No evidence of infection in chest or urine. CT head negative. Labs at baseline. Troponin negative.  CT abdomen obtained given patient's complaint to his family of abdominal pain. This was negative for acute pathology. Suspect CO2 narcosis as source of altered mental status.  He is more awake and alert than when he arrived.  Patient may have had seizure though no history of such to cause CO2 retention. Family confirms patient is DNR/DNI. Admission d/w Dr. [place] Pink.   CRITICAL CARE Performed by: Ezequiel Essex Total critical care time: 40 Critical care time was exclusive of separately billable procedures and treating other patients. Critical care was necessary to treat or prevent imminent or life-threatening deterioration. Critical care was time spent personally by me on the following activities: development of treatment plan with patient and/or surrogate as well as nursing,  discussions with consultants, evaluation of patient's response to treatment, examination of patient, obtaining history from patient or surrogate, ordering and performing treatments and interventions, ordering and review of laboratory studies, ordering and review of radiographic studies, pulse oximetry and re-evaluation of patient's condition.   I personally performed the services described in this documentation, which was scribed in my presence. The recorded information has been reviewed and is accurate.    Ezequiel Essex, MD 02/25/14 1145

## 2014-02-24 NOTE — ED Notes (Signed)
Per [facility] nursing facility pt is not acting like himself. Staff is unsure how long pt has been altered. Staff states he was normal yesterday.

## 2014-02-25 ENCOUNTER — Encounter (HOSPITAL_COMMUNITY): Payer: Self-pay | Admitting: Internal Medicine

## 2014-02-25 DIAGNOSIS — J9620 Acute and chronic respiratory failure, unspecified whether with hypoxia or hypercapnia: Secondary | ICD-10-CM | POA: Diagnosis present

## 2014-02-25 DIAGNOSIS — J9602 Acute respiratory failure with hypercapnia: Secondary | ICD-10-CM | POA: Diagnosis present

## 2014-02-25 DIAGNOSIS — J9600 Acute respiratory failure, unspecified whether with hypoxia or hypercapnia: Secondary | ICD-10-CM

## 2014-02-25 DIAGNOSIS — F22 Delusional disorders: Secondary | ICD-10-CM

## 2014-02-25 DIAGNOSIS — G9340 Encephalopathy, unspecified: Secondary | ICD-10-CM | POA: Diagnosis present

## 2014-02-25 DIAGNOSIS — Z803 Family history of malignant neoplasm of breast: Secondary | ICD-10-CM | POA: Diagnosis not present

## 2014-02-25 DIAGNOSIS — G8929 Other chronic pain: Secondary | ICD-10-CM | POA: Diagnosis present

## 2014-02-25 DIAGNOSIS — I10 Essential (primary) hypertension: Secondary | ICD-10-CM | POA: Diagnosis present

## 2014-02-25 DIAGNOSIS — R109 Unspecified abdominal pain: Secondary | ICD-10-CM

## 2014-02-25 DIAGNOSIS — F0390 Unspecified dementia without behavioral disturbance: Secondary | ICD-10-CM

## 2014-02-25 DIAGNOSIS — R0689 Other abnormalities of breathing: Secondary | ICD-10-CM | POA: Diagnosis present

## 2014-02-25 DIAGNOSIS — R0989 Other specified symptoms and signs involving the circulatory and respiratory systems: Secondary | ICD-10-CM

## 2014-02-25 DIAGNOSIS — R4182 Altered mental status, unspecified: Secondary | ICD-10-CM | POA: Diagnosis present

## 2014-02-25 DIAGNOSIS — H409 Unspecified glaucoma: Secondary | ICD-10-CM | POA: Diagnosis present

## 2014-02-25 DIAGNOSIS — R0609 Other forms of dyspnea: Secondary | ICD-10-CM

## 2014-02-25 DIAGNOSIS — K219 Gastro-esophageal reflux disease without esophagitis: Secondary | ICD-10-CM | POA: Diagnosis present

## 2014-02-25 DIAGNOSIS — E872 Acidosis, unspecified: Secondary | ICD-10-CM | POA: Diagnosis present

## 2014-02-25 DIAGNOSIS — H9190 Unspecified hearing loss, unspecified ear: Secondary | ICD-10-CM | POA: Diagnosis present

## 2014-02-25 DIAGNOSIS — Z801 Family history of malignant neoplasm of trachea, bronchus and lung: Secondary | ICD-10-CM | POA: Diagnosis not present

## 2014-02-25 DIAGNOSIS — D649 Anemia, unspecified: Secondary | ICD-10-CM | POA: Diagnosis present

## 2014-02-25 DIAGNOSIS — F0392 Unspecified dementia, unspecified severity, with psychotic disturbance: Secondary | ICD-10-CM | POA: Diagnosis present

## 2014-02-25 DIAGNOSIS — F0393 Unspecified dementia, unspecified severity, with mood disturbance: Secondary | ICD-10-CM | POA: Diagnosis present

## 2014-02-25 DIAGNOSIS — Z66 Do not resuscitate: Secondary | ICD-10-CM | POA: Diagnosis present

## 2014-02-25 DIAGNOSIS — E785 Hyperlipidemia, unspecified: Secondary | ICD-10-CM | POA: Diagnosis present

## 2014-02-25 DIAGNOSIS — J449 Chronic obstructive pulmonary disease, unspecified: Secondary | ICD-10-CM | POA: Diagnosis present

## 2014-02-25 DIAGNOSIS — G40909 Epilepsy, unspecified, not intractable, without status epilepticus: Secondary | ICD-10-CM | POA: Diagnosis present

## 2014-02-25 DIAGNOSIS — F411 Generalized anxiety disorder: Secondary | ICD-10-CM

## 2014-02-25 DIAGNOSIS — E119 Type 2 diabetes mellitus without complications: Secondary | ICD-10-CM | POA: Diagnosis present

## 2014-02-25 DIAGNOSIS — E118 Type 2 diabetes mellitus with unspecified complications: Secondary | ICD-10-CM

## 2014-02-25 DIAGNOSIS — Z87891 Personal history of nicotine dependence: Secondary | ICD-10-CM | POA: Diagnosis not present

## 2014-02-25 LAB — COMPREHENSIVE METABOLIC PANEL
AST: 9 U/L (ref 0–37)
Albumin: 3.3 g/dL — ABNORMAL LOW (ref 3.5–5.2)
Alkaline Phosphatase: 71 U/L (ref 39–117)
BILIRUBIN TOTAL: 0.2 mg/dL — AB (ref 0.3–1.2)
BUN: 14 mg/dL (ref 6–23)
CALCIUM: 10 mg/dL (ref 8.4–10.5)
CHLORIDE: 95 meq/L — AB (ref 96–112)
CO2: 43 mEq/L (ref 19–32)
Creatinine, Ser: 0.8 mg/dL (ref 0.50–1.35)
GFR calc non Af Amer: 82 mL/min — ABNORMAL LOW (ref >90)
Glucose, Bld: 121 mg/dL — ABNORMAL HIGH (ref 70–99)
Potassium: 5.2 mEq/L (ref 3.7–5.3)
SODIUM: 143 meq/L (ref 137–147)
Total Protein: 7.7 g/dL (ref 6.0–8.3)

## 2014-02-25 LAB — BLOOD GAS, ARTERIAL
Acid-Base Excess: 13.7 mmol/L — ABNORMAL HIGH (ref 0.0–2.0)
Bicarbonate: 40.3 mEq/L — ABNORMAL HIGH (ref 20.0–24.0)
DELIVERY SYSTEMS: POSITIVE
DRAWN BY: 382351
EXPIRATORY PAP: 5
FIO2: 0.32 %
INSPIRATORY PAP: 12
O2 Saturation: 92 %
PATIENT TEMPERATURE: 37
PO2 ART: 66.8 mmHg — AB (ref 80.0–100.0)
RATE: 6 resp/min
TCO2: 38 mmol/L (ref 0–100)
pCO2 arterial: 82.2 mmHg (ref 35.0–45.0)
pH, Arterial: 7.312 — ABNORMAL LOW (ref 7.350–7.450)

## 2014-02-25 LAB — HEMOGLOBIN A1C
Hgb A1c MFr Bld: 5.8 % — ABNORMAL HIGH (ref <5.7)
MEAN PLASMA GLUCOSE: 120 mg/dL — AB (ref <117)

## 2014-02-25 LAB — CBC
HCT: 37.4 % — ABNORMAL LOW (ref 39.0–52.0)
Hemoglobin: 10.5 g/dL — ABNORMAL LOW (ref 13.0–17.0)
MCH: 27 pg (ref 26.0–34.0)
MCHC: 28.1 g/dL — AB (ref 30.0–36.0)
MCV: 96.1 fL (ref 78.0–100.0)
PLATELETS: 165 10*3/uL (ref 150–400)
RBC: 3.89 MIL/uL — ABNORMAL LOW (ref 4.22–5.81)
RDW: 15.5 % (ref 11.5–15.5)
WBC: 8.8 10*3/uL (ref 4.0–10.5)

## 2014-02-25 LAB — MRSA PCR SCREENING: MRSA BY PCR: NEGATIVE

## 2014-02-25 LAB — GLUCOSE, CAPILLARY
GLUCOSE-CAPILLARY: 97 mg/dL (ref 70–99)
GLUCOSE-CAPILLARY: 97 mg/dL (ref 70–99)
Glucose-Capillary: 110 mg/dL — ABNORMAL HIGH (ref 70–99)
Glucose-Capillary: 105 mg/dL — ABNORMAL HIGH (ref 70–99)
Glucose-Capillary: 111 mg/dL — ABNORMAL HIGH (ref 70–99)

## 2014-02-25 LAB — PRO B NATRIURETIC PEPTIDE: PRO B NATRI PEPTIDE: 64 pg/mL (ref 0–450)

## 2014-02-25 LAB — TROPONIN I

## 2014-02-25 MED ORDER — SODIUM CHLORIDE 0.9 % IV SOLN
250.0000 mg | Freq: Two times a day (BID) | INTRAVENOUS | Status: DC
  Administered 2014-02-25 – 2014-02-26 (×3): 250 mg via INTRAVENOUS
  Filled 2014-02-25 (×10): qty 2.5

## 2014-02-25 MED ORDER — PILOCARPINE HCL 1 % OP SOLN
1.0000 [drp] | Freq: Four times a day (QID) | OPHTHALMIC | Status: DC
  Administered 2014-02-25 – 2014-02-27 (×9): 1 [drp] via OPHTHALMIC
  Filled 2014-02-25: qty 15

## 2014-02-25 MED ORDER — LATANOPROST 0.005 % OP SOLN
1.0000 [drp] | Freq: Every day | OPHTHALMIC | Status: DC
  Administered 2014-02-27: 1 [drp] via OPHTHALMIC
  Filled 2014-02-25: qty 2.5

## 2014-02-25 MED ORDER — FUROSEMIDE 10 MG/ML IJ SOLN
20.0000 mg | Freq: Once | INTRAMUSCULAR | Status: AC
  Administered 2014-02-25: 20 mg via INTRAVENOUS
  Filled 2014-02-25: qty 2

## 2014-02-25 MED ORDER — DORZOLAMIDE HCL-TIMOLOL MAL PF 22.3-6.8 MG/ML OP SOLN
1.0000 [drp] | Freq: Two times a day (BID) | OPHTHALMIC | Status: DC
  Administered 2014-02-25 (×2): 1 [drp] via OPHTHALMIC
  Filled 2014-02-25 (×4): qty 1

## 2014-02-25 MED ORDER — ONDANSETRON HCL 4 MG PO TABS
4.0000 mg | ORAL_TABLET | Freq: Four times a day (QID) | ORAL | Status: DC | PRN
Start: 2014-02-25 — End: 2014-02-27

## 2014-02-25 MED ORDER — ALBUTEROL SULFATE (2.5 MG/3ML) 0.083% IN NEBU: 2.5000 mg | INHALATION_SOLUTION | RESPIRATORY_TRACT | Status: DC | PRN

## 2014-02-25 MED ORDER — HEPARIN SODIUM (PORCINE) 5000 UNIT/ML IJ SOLN
5000.0000 [IU] | Freq: Three times a day (TID) | INTRAMUSCULAR | Status: DC
  Administered 2014-02-25 – 2014-02-27 (×7): 5000 [IU] via SUBCUTANEOUS
  Filled 2014-02-25 (×7): qty 1

## 2014-02-25 MED ORDER — BRIMONIDINE TARTRATE 0.15 % OP SOLN
1.0000 [drp] | Freq: Two times a day (BID) | OPHTHALMIC | Status: DC
  Administered 2014-02-25 – 2014-02-27 (×6): 1 [drp] via OPHTHALMIC
  Filled 2014-02-25: qty 5

## 2014-02-25 MED ORDER — ALBUTEROL SULFATE (2.5 MG/3ML) 0.083% IN NEBU
2.5000 mg | INHALATION_SOLUTION | RESPIRATORY_TRACT | Status: DC
  Administered 2014-02-25 – 2014-02-27 (×11): 2.5 mg via RESPIRATORY_TRACT
  Filled 2014-02-25 (×11): qty 3

## 2014-02-25 MED ORDER — SODIUM CHLORIDE 0.9 % IJ SOLN: 3.0000 mL | INTRAMUSCULAR | Status: DC | PRN

## 2014-02-25 MED ORDER — BIOTENE DRY MOUTH MT LIQD
15.0000 mL | Freq: Two times a day (BID) | OROMUCOSAL | Status: DC
  Administered 2014-02-25 – 2014-02-27 (×5): 15 mL via OROMUCOSAL

## 2014-02-25 MED ORDER — LORAZEPAM 2 MG/ML IJ SOLN
0.5000 mg | INTRAMUSCULAR | Status: DC | PRN
  Administered 2014-02-25 – 2014-02-26 (×4): 0.5 mg via INTRAVENOUS
  Filled 2014-02-25 (×4): qty 1

## 2014-02-25 MED ORDER — INSULIN ASPART 100 UNIT/ML ~~LOC~~ SOLN
0.0000 [IU] | SUBCUTANEOUS | Status: DC
  Administered 2014-02-26: 3 [IU] via SUBCUTANEOUS
  Administered 2014-02-26 (×4): 2 [IU] via SUBCUTANEOUS
  Administered 2014-02-27: 3 [IU] via SUBCUTANEOUS
  Administered 2014-02-27 (×3): 2 [IU] via SUBCUTANEOUS

## 2014-02-25 MED ORDER — ONDANSETRON HCL 4 MG/2ML IJ SOLN: 4.0000 mg | Freq: Four times a day (QID) | INTRAMUSCULAR | Status: DC | PRN

## 2014-02-25 MED ORDER — DORZOLAMIDE HCL-TIMOLOL MAL 2-0.5 % OP SOLN
OPHTHALMIC | Status: AC
  Filled 2014-02-25: qty 10

## 2014-02-25 MED ORDER — LEVETIRACETAM 500 MG/5ML IV SOLN
INTRAVENOUS | Status: AC
  Filled 2014-02-25: qty 5

## 2014-02-25 MED ORDER — METHYLPREDNISOLONE SODIUM SUCC 40 MG IJ SOLR
40.0000 mg | Freq: Four times a day (QID) | INTRAMUSCULAR | Status: DC
  Administered 2014-02-25 – 2014-02-27 (×8): 40 mg via INTRAVENOUS
  Filled 2014-02-25 (×8): qty 1

## 2014-02-25 MED ORDER — CHLORHEXIDINE GLUCONATE 0.12 % MT SOLN
15.0000 mL | Freq: Two times a day (BID) | OROMUCOSAL | Status: DC
  Administered 2014-02-25 – 2014-02-27 (×5): 15 mL via OROMUCOSAL
  Filled 2014-02-25 (×5): qty 15

## 2014-02-25 MED ORDER — SODIUM CHLORIDE 0.9 % IV SOLN: 250.0000 mL | INTRAVENOUS | Status: DC | PRN

## 2014-02-25 MED ORDER — ACETAMINOPHEN 650 MG RE SUPP
650.0000 mg | Freq: Four times a day (QID) | RECTAL | Status: DC | PRN
Start: 2014-02-25 — End: 2014-02-27

## 2014-02-25 MED ORDER — BRIMONIDINE TARTRATE 0.2 % OP SOLN
OPHTHALMIC | Status: AC
  Filled 2014-02-25: qty 5

## 2014-02-25 MED ORDER — SODIUM CHLORIDE 0.9 % IJ SOLN
3.0000 mL | Freq: Two times a day (BID) | INTRAMUSCULAR | Status: DC
  Administered 2014-02-25 – 2014-02-27 (×6): 3 mL via INTRAVENOUS

## 2014-02-25 MED ORDER — DIVALPROEX SODIUM 125 MG PO CPSP
500.0000 mg | ORAL_CAPSULE | Freq: Three times a day (TID) | ORAL | Status: DC
  Administered 2014-02-25 – 2014-02-27 (×7): 500 mg via ORAL
  Filled 2014-02-25 (×13): qty 4

## 2014-02-25 MED ORDER — DORZOLAMIDE HCL-TIMOLOL MAL 2-0.5 % OP SOLN
1.0000 [drp] | Freq: Two times a day (BID) | OPHTHALMIC | Status: DC
  Administered 2014-02-25 – 2014-02-27 (×4): 1 [drp] via OPHTHALMIC
  Filled 2014-02-25: qty 10

## 2014-02-25 MED ORDER — DONEPEZIL HCL 5 MG PO TABS
10.0000 mg | ORAL_TABLET | Freq: Every day | ORAL | Status: DC
  Administered 2014-02-25 – 2014-02-26 (×2): 10 mg via ORAL
  Filled 2014-02-25 (×2): qty 2

## 2014-02-25 MED ORDER — ACETAMINOPHEN 325 MG PO TABS
650.0000 mg | ORAL_TABLET | Freq: Four times a day (QID) | ORAL | Status: DC | PRN
  Administered 2014-02-25: 650 mg via ORAL
  Filled 2014-02-25: qty 2

## 2014-02-25 MED ORDER — SODIUM CHLORIDE 0.9 % IJ SOLN: 3.0000 mL | Freq: Two times a day (BID) | INTRAMUSCULAR | Status: DC

## 2014-02-25 MED ORDER — CITALOPRAM HYDROBROMIDE 20 MG PO TABS
10.0000 mg | ORAL_TABLET | Freq: Every day | ORAL | Status: DC
  Administered 2014-02-25 – 2014-02-27 (×3): 10 mg via ORAL
  Filled 2014-02-25 (×3): qty 1

## 2014-02-25 NOTE — ED Notes (Signed)
RT paged to place pt on Bipap

## 2014-02-25 NOTE — Evaluation (Signed)
Physical Therapy Evaluation Patient Details Name: [name] [name] MRN: [id number] DOB: [birth year] Today's Date: 02/25/2014   History of Present Illness  [name] [name] is a 80 y.o. male with a past medical history of dementia, hypertension, Keppra, diabetes, seizure disorder, glaucoma, who lives in a skilled nursing facility. He was sent over from there because of altered mental status. Patient is currently on BiPAP and has advanced dementia and so is unable to provide any history. According to the emergency department records over the past 2-3 weeks ago. He has been sleeping more than normal. Today he was unresponsive. He was complaining of abdominal pain. He has history of hospitalization for UTI. He has a history of COPD. In the emergency department evaluation revealed hypercapnia. Patient was placed on BiPAP and his mental status has improved. Though he still remains confused due to his baseline dementia.  Clinical Impression  Pt is an 80 year old male who presents to physical therapy from a SNF secondary to altered mental status.  Family present for evaluation.  Pt currently on BiPAP during evaluation, and family reports pt usually uses 2L O2 via [place].  At facility, family reports pt requires assistance with bed mobility skills, transfers, and W/C propulsion (pt is non-ambulatory).  Family does report in the past month, pt has been requiring increasing assistance of two people for bed mobility and transfers, and progressing into using a Hoyer Lift for transfers as pt has not been assisting/participating during transfers.  No noted contractures in ankle or knees, though ankles are only to neutral in dorsiflexion so some tightness is present.  Pt required max assist to initiate rolling, and mod assist once he was able to grab onto handrail and assist; pt able to maintain sidelying position to the Rt > Lt without assistance.  Further mobility not assessed (supine <-> sit) secondary to SOB with BiPAP  on during rolling; will assess as able.  Pt appears to be about baseline, possibly with some generalized weakness which will be assessed as able.  Recommend continued PT to assess if pt is at baseline for functional mobility skills, and transition to SNF to continue care.  No DME recommendations at this time.     Follow Up Recommendations SNF    Equipment Recommendations  None recommended by PT       Precautions / Restrictions Precautions Precautions: Fall Restrictions Weight Bearing Restrictions: No      Mobility  Bed Mobility Overal bed mobility: Needs Assistance;+2 for physical assistance Bed Mobility: Rolling Rolling: Mod assist;Max assist         General bed mobility comments: Max assist to roll until pt able to reach hand rails, then moderate assist as pt able to help some.  Pt able to maintain sidelying position for 10 seconds with 1 hand on handrail.  Max assist x2 to scoot up in bed, though VC and feet positioned to assist with pushing through legs.  Further mobility not assessed (supine<->sit) as pt agitated with BiPap machine and untilling to participate.    Transfers Overall transfer level: Needs assistance               General transfer comment: Per family, 2 person assist for squat pivot transfer, or more recently use of hoyer lift for transfer to W/C.   Ambulation/Gait             General Gait Details: Pt is non-ambulatory and does not self propel w/c.       Balance Overall balance  assessment: Needs assistance                                           Pertinent Vitals/Pain PAINAD 1    Home Living Family/patient expects to be discharged to:: Skilled nursing facility                 Additional Comments: Pt lives at [place]: Needs assistance   Gait / Transfers Assistance Needed: Per family, patient required assist x2 with all mobility skills, and has progressed to [employer]  for transfers in the past month as needed.  Pt does not assist with transfers recently, requiring increasing assist.  Pt is non-ambulatory, and uses manual W/C for mobility; pt does not self propel W/C.  Family reports pt used to be able to hold legs up when W/C was being pushed, however in the past month has been unable to do that.                Extremity/Trunk Assessment   Upper Extremity Assessment: Defer to OT evaluation           Lower Extremity Assessment: Generalized weakness;Difficult to assess due to impaired cognition;RLE deficits/detail;LLE deficits/detail (Pt was able to move ankle up/down, and bend knee up/down some though required active assist to complete ROM) RLE Deficits / Details: Rt ankle to neutral dorsiflexion, no contractures noted in hamstring length LLE Deficits / Details: Lt ankle to neutral dorsiflexion, no contractures noted in hamstring length     Communication   Communication: HOH  Cognition Arousal/Alertness: Awake/alert Behavior During Therapy: Restless                          Assessment/Plan    PT Assessment Patient needs continued PT services  PT Diagnosis Generalized weakness   PT Problem List Decreased strength;Decreased activity tolerance  PT Treatment Interventions Functional mobility training;Therapeutic activities;Therapeutic exercise   PT Goals (Current goals can be found in the Care Plan section) Acute Rehab PT Goals Patient Stated Goal: Return to SNF PT Goal Formulation: With patient/family Time For Goal Achievement: 03/11/14 Potential to Achieve Goals: Good    Frequency Min 3X/week    End of Session Equipment Utilized During Treatment: Oxygen (BiPap, hx of O2 use via [place] at 2L) Activity Tolerance: Treatment limited secondary to medical complications (Comment);Other (comment) (Limited by SOB) Patient left: in bed;with call bell/phone within reach;with family/visitor present           Time: 1501-1520 PT Time  Calculation (min): 19 min   Charges:   PT Evaluation $Initial PT Evaluation Tier I: 1 Procedure          Torrin Frein 02/25/2014, 3:36 PM

## 2014-02-25 NOTE — Progress Notes (Signed)
Pt received from ED, pt is restless and has intermittent confusion but answers most orientation questions appropriately, one of his daughters is at the bedside, VSS w/ exception of O2 sat that is ranging from low 80's -high 90's on bipap, pt c/o pain r/t needing to urinate but pt has foley in place, re oriented pt to this fact and other facts, low bed, hob @ 30 degrees, call bell is in pt's hand, will cont to monitor

## 2014-02-25 NOTE — Consult Note (Signed)
Consult requested by: [facility] Consult requested for respiratory failure:  HPI: This is an 80 year old who was in his usual state of poor health living at a skilled care facility who then presented with increased lethargy and shortness of breath. When he came to the emergency department he showed significant hypercapnia and he was placed on that. He got better it remained confused and he did not cooperate with BiPAP without significant sedation. His past medical history includes diagnoses below not sure that he actually has a diagnosis of mesothelioma. He does have pleural plaques but based on CT scanning done in the emergency department I don't think they are changed significantly. He remains on BiPAP. He remained short of breath when he has his BiPAP off. He has been hypoxic  Past Medical History  Diagnosis Date  . Glaucoma   . Anxiety   . GERD (gastroesophageal reflux disease)   . Hyperlipidemia   . Hypertension   . Chronic abdominal pain   . 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 Chronic respiratory failure   . Altered mental status 06/12/2012; 08/14/2012    Unwitnessed isolated seizure suspected, but because of the EEG being normal, Dr. Merlene Laughter did not recommend antiseizure therapy.; "violent since 08/13/2012" (08/14/2012)  . Hard of hearing   . Seizure 06/14/2012    Isolated seizure suspected, but not confirmed.  . Bradycardia   . Complication of anesthesia 2005    "couldn't get him woke up when he should have after eye surgery" (08/14/2012)  . Mesothelioma   . Pleural plaque 04/2012  . Exertional dyspnea   . DM type 2 (diabetes mellitus, type 2) 06/13/2012  . UTI (lower urinary tract infection)      Family History  Problem Relation Age of Onset  . Lung cancer Sister   . Breast cancer Sister      History   Social History  . Marital Status: Widowed    Spouse Name: N/A    Number of Children: 8  . Years of Education: N/A   Occupational History   . diasable     Social History Main Topics  . Smoking status: Former Smoker -- 2.00 packs/day for 40 years    Types: Cigarettes  . Smokeless tobacco: Never Used     Comment: "quit smoking cigarettes ~ 2001"  . Alcohol Use: Yes     Comment: 08/14/2012 "used to drink heavily; stopped ~ 2001"  . Drug Use: No  . Sexual Activity: None   Other Topics Concern  . None   Social History Narrative  . None     ROS: Unobtainable    Objective: Vital signs in last 24 hours: Temp:  [97.6 F (36.4 C)-98.7 F (37.1 C)] 98.7 F (37.1 C) (06/18 0227) Pulse Rate:  [78-115] 88 (06/18 1700) Resp:  [16-35] 19 (06/18 1700) BP: (90-183)/(53-96) 90/53 mmHg (06/18 1700) SpO2:  [78 %-100 %] 99 % (06/18 1700) FiO2 (%):  [28 %] 28 % (06/18 0600) Weight:  [82.5 kg (181 lb 14.1 oz)-88.451 kg (195 lb)] 82.5 kg (181 lb 14.1 oz) (06/18 0227) Weight change:  Last BM Date: 02/25/14  Intake/Output from previous day: 06/17 0701 - 06/18 0700 In: 125.5 [I.V.:23; IV Piggyback:102.5] Out: 2000 [Urine:2000]  PHYSICAL EXAM He is lying quietly. He is on BiPAP. He has received medications for anxiety. His neck is supple. His chest shows some rhonchi bilaterally. His heart is regular by exam. Abdomen soft B. is somewhat tender in his abdomen he has chronic  abdominal pain. Extremities showed trace edema. Central nervous system exam not really evaluated because of his sedation.  Lab Results: Basic Metabolic Panel:  Recent Labs  02/24/14 2107 02/25/14 0308  NA 141 143  K 4.4 5.2  CL 93* 95*  CO2 44* 43*  GLUCOSE 117* 121*  BUN 15 14  CREATININE 0.88 0.80  CALCIUM 9.9 10.0   Liver Function Tests:  Recent Labs  02/24/14 2107 02/25/14 0308  AST 9 9  ALT 5 <5  ALKPHOS 66 71  BILITOT <0.2* 0.2*  PROT 7.4 7.7  ALBUMIN 3.1* 3.3*   No results found for this basename: LIPASE, AMYLASE,  in the last 72 hours  Recent Labs  02/24/14 2107  AMMONIA 47   CBC:  Recent Labs  02/24/14 2107  02/25/14 0308  WBC 6.8 8.8  NEUTROABS 4.1  --   HGB 10.3* 10.5*  HCT 36.9* 37.4*  MCV 97.4 96.1  PLT 158 165   Cardiac Enzymes:  Recent Labs  02/24/14 2107 02/25/14 0300  TROPONINI <0.30 <0.30   BNP:  Recent Labs  02/25/14 0300  PROBNP 64.0   D-Dimer: No results found for this basename: DDIMER,  in the last 72 hours CBG:  Recent Labs  02/25/14 0321 02/25/14 0738 02/25/14 1133 02/25/14 1632  GLUCAP 110* 105* 97 97   Hemoglobin A1C:  Recent Labs  02/25/14 0308  HGBA1C 5.8*   Fasting Lipid Panel: No results found for this basename: CHOL, HDL, LDLCALC, TRIG, CHOLHDL, LDLDIRECT,  in the last 72 hours Thyroid Function Tests: No results found for this basename: TSH, T4TOTAL, FREET4, T3FREE, THYROIDAB,  in the last 72 hours Anemia Panel: No results found for this basename: VITAMINB12, FOLATE, FERRITIN, TIBC, IRON, RETICCTPCT,  in the last 72 hours Coagulation:  Recent Labs  02/24/14 2107  LABPROT 12.6  INR 0.96   Urine Drug Screen: Drugs of Abuse  No results found for this basename: labopia, cocainscrnur, labbenz, amphetmu, thcu, labbarb    Alcohol Level: No results found for this basename: ETH,  in the last 72 hours Urinalysis:  Recent Labs  02/24/14 2138  COLORURINE YELLOW  LABSPEC 1.025  PHURINE 6.0  GLUCOSEU NEGATIVE  HGBUR NEGATIVE  BILIRUBINUR NEGATIVE  KETONESUR TRACE*  PROTEINUR NEGATIVE  UROBILINOGEN 0.2  NITRITE NEGATIVE  [place]. Labs:   ABGS:  Recent Labs  02/25/14 0300  PHART 7.312*  PO2ART 66.8*  TCO2 38.0  HCO3 40.3*     MICROBIOLOGY: Recent Results (from the past 240 hour(s))  MRSA PCR SCREENING     Status: None   Collection Time    02/25/14  2:27 AM      Result Value Ref Range Status   MRSA by PCR NEGATIVE  NEGATIVE Final   Comment:            The GeneXpert MRSA Assay (FDA     approved for NASAL specimens     only), is one component of a     comprehensive MRSA colonization      surveillance program. It is not     intended to diagnose MRSA     infection nor to guide or     monitor treatment for     MRSA infections.    Studies/Results: Ct Abdomen Pelvis Wo Contrast  02/25/2014   CLINICAL DATA:  Altered mental status.  EXAM: CT ABDOMEN AND PELVIS WITHOUT CONTRAST  TECHNIQUE: Multidetector CT imaging of the abdomen and pelvis was performed following the standard protocol without IV  contrast.  COMPARISON:  02/27/2012 CT.  FINDINGS: Bones: No aggressive osseous lesions. Lumbar degenerative disease and Schmorl's nodes. Bilateral hip osteoarthritis.  Lung Bases: Multiple chronic rounded opacities over the hemidiaphragm bilaterally. These appear similar to the prior exam from 2013 although there is motion artifact on today's examination. The origin of these nodules is unclear. Regardless, given the chronicity, these are almost certainly benign and probably related to asbestos related pleural disease.  Liver: Unenhanced CT was performed per clinician order. Lack of IV contrast limits sensitivity and specificity, especially for evaluation of abdominal/pelvic solid viscera. Grossly normal.  Spleen:  Normal.  Gallbladder:  Normal.  Common bile duct:  Normal.  Pancreas:  Normal.  Adrenal glands:  Normal bilaterally.  Kidneys: No renal calculi. No hydronephrosis. Both ureters are within normal limits.  Stomach:  Normal.  Small bowel:  Normal.  Colon:   Normal.  Normal appendix.  Pelvic Genitourinary: Foley catheter. Collapsed urinary bladder. No free fluid.  Vasculature: Atherosclerosis without an acute vascular abnormality.  Body Wall: Fat containing left inguinal hernia. Probable right inguinal herniorrhaphy.  IMPRESSION: 1. No acute abnormality. 2. 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   Electronically Signed   By: Dereck Ligas M.D.   On: 02/25/2014 00:15   Ct Head Wo Contrast  02/25/2014   CLINICAL DATA:   Altered mental status worsening over last 2 weeks.  EXAM: CT HEAD WITHOUT CONTRAST  TECHNIQUE: Contiguous axial images were obtained from the base of the skull through the vertex without intravenous contrast.  COMPARISON:  10/17/2012.  FINDINGS: No mass lesion, mass effect, midline shift, hydrocephalus, hemorrhage. No acute territorial cortical ischemia/infarct. Atrophy and chronic ischemic white matter disease is present. Small lacune is present in the right basal ganglia. Metal object present along the right lateral globe, unchanged from prior.  IMPRESSION: Atrophy and chronic ischemic white matter disease with old right basal ganglia lacunar infarct. No acute intracranial abnormality.   Electronically Signed   By: Dereck Ligas M.D.   On: 02/25/2014 00:09   Dg Chest Portable 1 View  02/24/2014   CLINICAL DATA:  Dementia.  Bradycardia  EXAM: PORTABLE CHEST - 1 VIEW  COMPARISON:  None.  FINDINGS: Cardiomegaly is present. Pulmonary vascular congestion. Interstitial pulmonary edema. Basilar atelectasis. Monitoring leads project over the chest. Tortuous thoracic aorta appears similar to prior exam. No pleural effusion is identified.  IMPRESSION: Constellation of findings compatible with mild CHF.   Electronically Signed   By: Dereck Ligas M.D.   On: 02/24/2014 21:12    Medications:  Prior to Admission:  Prescriptions prior to admission  Medication Sig Dispense Refill  . acetaminophen (TYLENOL) 650 MG CR tablet Take 650 mg by mouth 2 (two) times daily.      Marland Kitchen albuterol (PROVENTIL) (2.5 MG/3ML) 0.083% nebulizer solution Take 2.5 mg by nebulization 4 (four) times daily.      Marland Kitchen amLODipine (NORVASC) 5 MG tablet Take 1 tablet (5 mg total) by mouth daily.  30 tablet  0  . brimonidine (ALPHAGAN P) 0.1 % SOLN Place 1 drop into both eyes 2 (two) times daily.       . citalopram (CELEXA) 10 MG tablet Take 10 mg by mouth daily.      . divalproex (DEPAKOTE SPRINKLE) 125 MG capsule Take 500 mg by mouth 3 (three)  times daily.      Marland Kitchen donepezil (ARICEPT) 10 MG tablet Take 10 mg by mouth at bedtime.      Marland Kitchen  Dorzolamide HCl-Timolol Mal PF 22.3-6.8 MG/ML SOLN Place 1 drop into the right eye 2 (two) times daily.       . ferrous sulfate 325 (65 FE) MG EC tablet Take 325 mg by mouth 2 (two) times daily.      Marland Kitchen latanoprost (XALATAN) 0.005 % ophthalmic solution Place 1 drop into the right eye at bedtime.       . levETIRAcetam (KEPPRA) 250 MG tablet Take 250 mg by mouth 2 (two) times daily.      Marland Kitchen loratadine (CLARITIN) 10 MG tablet Take 10 mg by mouth daily.      Marland Kitchen LORazepam (ATIVAN) 0.5 MG tablet Take 1 tablet (0.5 mg total) by mouth 2 (two) times daily.  10 tablet  0  . lovastatin (MEVACOR) 40 MG tablet Take 40 mg by mouth at bedtime.        . metFORMIN (GLUCOPHAGE) 500 MG tablet Take 500 mg by mouth 2 (two) times daily with a meal.      . montelukast (SINGULAIR) 10 MG tablet Take 10 mg by mouth every morning.       Marland Kitchen omeprazole (PRILOSEC) 20 MG capsule Take 20 mg by mouth daily.      . pilocarpine (PILOCAR) 1 % ophthalmic solution Place 1 drop into the right eye 4 (four) times daily.       . polyethylene glycol (MIRALAX / GLYCOLAX) packet Take 17 g by mouth daily.      Marland Kitchen senna-docusate (DOC-Q-LAX) 8.6-50 MG per tablet Take 1 tablet by mouth 2 (two) times daily.      . vitamin C (ASCORBIC ACID) 500 MG tablet Take 500 mg by mouth 2 (two) times daily.       Scheduled: . antiseptic oral rinse  15 mL Mouth Rinse q12n4p  . brimonidine  1 drop Both Eyes BID  . chlorhexidine  15 mL Mouth Rinse BID  . citalopram  10 mg Oral Daily  . divalproex  500 mg Oral TID  . donepezil  10 mg Oral QHS  . dorzolamide-timolol  1 drop Right Eye BID  . heparin  5,000 Units Subcutaneous 3 times per day  . insulin aspart  0-15 Units Subcutaneous 6 times per day  . latanoprost  1 drop Right Eye QHS  . levETIRAcetam  250 mg Intravenous Q12H  . pilocarpine  1 drop Right Eye QID  . sodium chloride  3 mL Intravenous Q12H   Continuous:   GUY:QIHKVQ chloride, acetaminophen, acetaminophen, albuterol, LORazepam, ondansetron (ZOFRAN) IV, ondansetron, sodium chloride  Assesment: He has acute respiratory failure with hypercapnia. He carries a diagnosis of chronic respiratory failure. This was made apparently in 2013. He has senile dementia and has significant anxiety from that. He is improving slowly. He has what looks like previous asbestos exposure but I do not believe he has a mesothelioma. 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 One sees a little better he will need to have speech evaluation. He needs steroids because of his COPD and respiratory failure. I have ordered that. He'll have another blood gas and chest x-ray in the morning.  Thanks for allowing me to see him with you    LOS: 1 day   [facility] L 02/25/2014, 5:47 PM

## 2014-02-25 NOTE — Care Management Utilization Note (Signed)
UR completed 

## 2014-02-25 NOTE — Clinical Social Work Psychosocial (Signed)
Clinical Social Work Department BRIEF PSYCHOSOCIAL ASSESSMENT 02/25/2014  Patient:  [name] [name], [name] [name]     Account Number:  [id number]     Admit date:  02/24/2014  Clinical Social Worker:  Edwyna Shell, CLINICAL SOCIAL WORKER  Date/Time:  02/25/2014 11:00 AM  Referred by:  CSW  Date Referred:  02/25/2014 Referred for  SNF Placement   Other Referral:   Interview type:  Family Other interview type:   Patient on Bipap and unable to participate.  Also spoke w [facility] admissions.    PSYCHOSOCIAL DATA Living Status:  FACILITY Admitted from facility:  [facility] Level of care:  [place] Primary support name:  [name] [name] Primary support relationship to patient:  CHILD, ADULT Degree of support available:   Has 8 children, all live in [place] area and are supportive.    CURRENT CONCERNS Current Concerns  Post-Acute Placement   Other Concerns:    SOCIAL WORK ASSESSMENT / PLAN CSW met w patient at bedside, patient on Bipap and sleepy, unable to fully participate.  CSW spoke w son in room and w Debbie at [facility].  Son confirms that patient has been at [facility] for approx 2 years and family is willing to have patient return at discharge.  Says patient is up in wheelchair daily and spends time in day room and other common areas in facility.  Has 8 siblings, all of whom are supportive and involved in patient's care at [facility].    [facility] admissions confirms that patient is long term Medicaid placement, has been there approx 2 years.  SNF is willing to take patient back at discharge.  [name] [name], daughter, has been primary decision maker among siblings.    CSW left VM for [facility] to return call.  CSW will continue to monitor and assist w discharge planning as needed.   Assessment/plan status:  Psychosocial Support/Ongoing Assessment of Needs Other assessment/ plan:   Information/referral to community resources:   Return to [facility].    PATIENT'S/FAMILY'S  RESPONSE TO PLAN OF CARE: Family member wants MD to sign FMLA paperwork at some point.  Family willing for patient to return to [facility].        Edwyna Shell, LCSW Clinical Social Worker [phone])

## 2014-02-25 NOTE — Evaluation (Signed)
Occupational Therapy Evaluation Patient Details Name: ANMOL FLECK MRN: [id number] DOB: 26-Oct-1930 Today's Date: 02/25/2014    History of Present Illness [name] [name] is a 80 y.o. male with a past medical history of dementia, hypertension, Keppra, diabetes, seizure disorder, glaucoma, who lives in a skilled nursing facility. He was sent over from there because of altered mental status. Patient is currently on BiPAP and has advanced dementia and so is unable to provide any history. According to the emergency department records over the past 2-3 weeks ago. He has been sleeping more than normal. Today he was unresponsive. He was complaining of abdominal pain. He has history of hospitalization for UTI. He has a history of COPD. In the emergency department evaluation revealed hypercapnia. Patient was placed on BiPAP and his mental status has improved. Though he still remains confused due to his baseline dementia.   Clinical Impression   Pt is presenting to acute OT with above situation.  He remains with altered mental status, increaing diffiuclty with ADL tasks.  He was living at [facility] prior to this hospitalization, and OTR recommends returning to SNF at d/c.  Pt will benefit from continued OT services for increasing strength and improving participating in ADL tasks.    Follow Up Recommendations  SNF    Equipment Recommendations   (Defer to SNF)    Recommendations for Other Services       Precautions / Restrictions Precautions Precautions: Fall Restrictions Weight Bearing Restrictions: No      Mobility Bed Mobility Overal bed mobility: Needs Assistance             General bed mobility comments: +2 to pull pt up in bed.  Pt was able to assist by pushing through legs.  Transfers                      Balance                                            ADL Overall ADL's : Needs assistance/impaired                                        General ADL Comments: Pt requring increased cueing overall for ADL tasks at this time, but was able to assist with bed mobility during session.     Vision                     Perception     Praxis      Pertinent Vitals/Pain Increased pain with voiding.  RN aware.     Hand Dominance Right   Extremity/Trunk Assessment Upper Extremity Assessment Upper Extremity Assessment: Difficult to assess due to impaired cognition;Generalized weakness   Lower Extremity Assessment Lower Extremity Assessment: Defer to PT evaluation       Communication Communication Communication: HOH   Cognition Arousal/Alertness: Awake/alert Behavior During Therapy: WFL for tasks assessed/performed;Restless Overall Cognitive Status: Impaired/Different from baseline Area of Impairment: Orientation;Attention;Following commands Orientation Level: Disoriented to;Situation   Memory: Decreased short-term memory Following Commands: Follows one step commands inconsistently       General Comments: Son reports good cognition at baseline.  Chart describes baseline dementia.   General Comments       Exercises  Shoulder Instructions      Home Living Family/patient expects to be discharged to:: Skilled nursing facility                                 Additional Comments: pt has been living in [facility] for past 2-3 years, per son.      Prior Functioning/Environment Level of Independence: Needs assistance  Gait / Transfers Assistance Needed: Per family, pt was not ambulating.  Used WC for mobility. ADL's / Homemaking Assistance Needed: At SNF pt receiving assist with dressing, bathing, and toilet transfers.  Famility would assist with feeding due to pt stating that he 'can't see.'   Comments: per son, pt was receiving PT/OT when first arriaved at [facility], but is unsure if therapy services have continued.    OT Diagnosis: Generalized weakness;Altered mental status    OT Problem List: Decreased strength;Pain;Cardiopulmonary status limiting activity;Decreased activity tolerance   OT Treatment/Interventions: Self-care/ADL training;Therapeutic exercise;Therapeutic activities;Cognitive remediation/compensation;Patient/family education;Visual/perceptual remediation/compensation    OT Goals(Current goals can be found in the care plan section) Acute Rehab OT Goals Patient Stated Goal: None stated OT Goal Formulation: With patient Time For Goal Achievement: 03/11/14 Potential to Achieve Goals: Fair ADL Goals Pt Will Perform Eating: with mod assist Pt Will Perform Upper Body Dressing: with min assist Pt Will Transfer to Toilet: with mod assist Pt/caregiver will Perform Home Exercise Program: Increased strength;Both right and left upper extremity  OT Frequency: Min 2X/week   Barriers to D/C:            Co-evaluation              End of Session Equipment Utilized During Treatment: Other (comment) (BiPAP)  Activity Tolerance: Patient tolerated treatment well Patient left: in bed;with nursing/sitter in room;with family/visitor present   Time: 1610-9604 OT Time Calculation (min): 23 min Charges:  OT General Charges $OT Visit: 1 Procedure OT Evaluation $Initial OT Evaluation Tier I: 1 Procedure G-Codes:     Bea Graff, MS, OTR/L [phone]  02/25/2014, 9:59 AM

## 2014-02-25 NOTE — ED Notes (Signed)
CRITICAL VALUE ALERT  Critical value received:  PH 7.202 CO2 110 PO2 131 bicarb41.6  Date of notification:  02/24/14  Time of notification:  2111  Critical value read back: yes  Nurse who received alert: Cheri Rous, RN  MD notified: Dr. Wyvonnia Dusky at 2113

## 2014-02-25 NOTE — Progress Notes (Signed)
Nutrition Brief Note  Patient at risk for malnutrition, due to braden score of 12.   Wt Readings from Last 15 Encounters:  02/25/14 181 lb 14.1 oz (82.5 kg)  04/28/13 173 lb 15.1 oz (78.9 kg)  10/18/12 163 lb 5.8 oz (74.1 kg)  10/10/12 195 lb (88.451 kg)  08/16/12 173 lb 14.4 oz (78.881 kg)  07/27/12 176 lb 12.9 oz (80.2 kg)  07/18/12 180 lb 6.4 oz (81.829 kg)  06/23/12 175 lb 1.9 oz (79.434 kg)  06/14/12 183 lb 4.8 oz (83.144 kg)  04/16/12 184 lb (83.462 kg)  02/07/12 180 lb 1.3 oz (81.684 kg)  12/12/11 175 lb (79.379 kg)  07/17/11 171 lb 6.4 oz (77.747 kg)  04/24/11 169 lb 1.9 oz (76.712 kg)  08/15/10 174 lb 4 oz (79.039 kg)   Pt is a resident of [facility], admitted with ARF with hypercapnia due CO2 narcosis and possible seizure. He is currently on Bi-PAP. Weight has been stable for the past year and no physical signs of malnutrition noted. Home diet is mechanical soft with nectar thick liquids. Anticipate good intake with diet advancement.   Body mass index is 29.37 kg/(m^2). Patient meets criteria for overweight based on current BMI.   Current diet order is NPO, patient is currently on Bi-Pap. Labs and medications reviewed.   No nutrition interventions warranted at this time. If nutrition issues arise, please consult RD.   Jenifer A. Jimmye Norman, RD, LDN Pager: [phone]

## 2014-02-25 NOTE — Progress Notes (Addendum)
CRITICAL VALUE ALERT  Critical value received:  PCO2 (from blood draw, not ABG) 43  Date of notification:  02/25/14  Time of notification:  0350  Critical value read back:YES  Nurse who received alert:  Bunnie Rehberg S. Lavone Neri, RN  MD notified (1st page):  Curly Rim  Time of first page:  0400  MD notified (2nd page):  Time of second page:  Responding MD:  Curly Rim  Time MD responded:  [phone]

## 2014-02-25 NOTE — Progress Notes (Signed)
CRITICAL VALUE ALERT  Critical value received:  PCO2 82.2  Date of notification:  02/25/14  Time of notification:  1610  Critical value read back: Yes  Nurse who received alert:  Tory S. Lavone Neri, RN  MD notified (1st page): Curly Rim   Time of first page:  0400  MD notified (2nd page):  Time of second page:  Responding MD:  Curly Rim  Time MD responded:  [phone]

## 2014-02-25 NOTE — ED Notes (Signed)
Pt. Placed on cardiac monitor.

## 2014-02-25 NOTE — Progress Notes (Signed)
ANTIBIOTIC CONSULT NOTE - INITIAL  Pharmacy Consult for Zosyn Indication: rule out pneumonia  No Known Allergies  Patient Measurements: Height: 5\' 6"  (167.6 cm) Weight: 181 lb 14.1 oz (82.5 kg) IBW/kg (Calculated) : 63.8  Vital Signs: Temp: 98.7 F (37.1 C) (06/18 1600) Temp src: Axillary (06/18 1600) BP: 116/74 mmHg (06/18 1805) Pulse Rate: 90 (06/18 1805) Intake/Output from previous day: 06/17 0701 - 06/18 0700 In: 125.5 [I.V.:23; IV Piggyback:102.5] Out: 2000 [Urine:2000] Intake/Output from this shift: Total I/O In: -  Out: 700 [Urine:700]  Labs:  Recent Labs  02/24/14 2107 02/25/14 0308  WBC 6.8 8.8  HGB 10.3* 10.5*  PLT 158 165  CREATININE 0.88 0.80   Estimated Creatinine Clearance: 74.3 ml/min (by C-G formula based on Cr of 0.8). No results found for this basename: VANCOTROUGH, Corlis Leak, VANCORANDOM, [place], GENTPEAK, GENTRANDOM, TOBRATROUGH, TOBRAPEAK, TOBRARND, AMIKACINPEAK, AMIKACINTROU, AMIKACIN,  in the last 72 hours   Microbiology: Recent Results (from the past 720 hour(s))  MRSA PCR SCREENING     Status: None   Collection Time    02/25/14  2:27 AM      Result Value Ref Range Status   MRSA by PCR NEGATIVE  NEGATIVE Final   Comment:            The GeneXpert MRSA Assay (FDA     approved for NASAL specimens     only), is one component of a     comprehensive MRSA colonization     surveillance program. It is not     intended to diagnose MRSA     infection nor to guide or     monitor treatment for     MRSA infections.    Medical History: Past Medical History  Diagnosis Date  . Glaucoma   . Anxiety   . GERD (gastroesophageal reflux disease)   . Hyperlipidemia   . Hypertension   . Chronic abdominal pain   . 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 Chronic respiratory failure   . Altered mental status 06/12/2012; 08/14/2012    Unwitnessed isolated seizure suspected, but because of the EEG being normal, Dr. Merlene Laughter did  not recommend antiseizure therapy.; "violent since 08/13/2012" (08/14/2012)  . Hard of hearing   . Seizure 06/14/2012    Isolated seizure suspected, but not confirmed.  . Bradycardia   . Complication of anesthesia 2005    "couldn't get him woke up when he should have after eye surgery" (08/14/2012)  . Mesothelioma   . Pleural plaque 04/2012  . Exertional dyspnea   . DM type 2 (diabetes mellitus, type 2) 06/13/2012  . UTI (lower urinary tract infection)     Medications:  Scheduled:  . albuterol  2.5 mg Nebulization Q4H  . antiseptic oral rinse  15 mL Mouth Rinse q12n4p  . brimonidine  1 drop Both Eyes BID  . chlorhexidine  15 mL Mouth Rinse BID  . citalopram  10 mg Oral Daily  . divalproex  500 mg Oral TID  . donepezil  10 mg Oral QHS  . dorzolamide-timolol  1 drop Right Eye BID  . heparin  5,000 Units Subcutaneous 3 times per day  . insulin aspart  0-15 Units Subcutaneous 6 times per day  . latanoprost  1 drop Right Eye QHS  . levETIRAcetam  250 mg Intravenous Q12H  . methylPREDNISolone (SOLU-MEDROL) injection  40 mg Intravenous Q6H  . pilocarpine  1 drop Right Eye QID  . sodium chloride  3 mL Intravenous Q12H  Assessment: 80 yo M who presented from NH with acute respiratory failure and confusion.  He has hx of COPD.  CXR + HF exacerbation, however concern for aspiration PNA therefore Zosyn is being started.   Pt is afebrile with normal WBC.   Renal function is at patient's baseline.    Goal of Therapy:  Eradicate infection.  Plan:  Zosyn 3.375gm IV Q8h to be infused over 4hrs Monitor renal function, cx data, patient progress  Biagio Borg 02/25/2014,6:17 PM

## 2014-02-25 NOTE — ED Notes (Addendum)
CRITICAL VALUE ALERT  Critical value received:  CO2 44  Date of notification:  02/24/14 Time of notification:  2148  Critical value read back: yes  Nurse who received alert: Cheri Rous, RN  MD notified: Dr. Wyvonnia Dusky at 2151

## 2014-02-25 NOTE — ED Notes (Addendum)
Pt. Coughing, pt. Unable to clear secretions. Pt. Mouth Suctioned.

## 2014-02-25 NOTE — ED Notes (Signed)
Pt. Family reports "pt has been getting worse over past 2 weeks but seemed to get a lot worse today." Reports that earlier today pt. Was sleeping and would not respond to verbal stimuli. Reports pt would only respond minimally to painful stimuli.

## 2014-02-25 NOTE — Clinical Social Work Note (Signed)
CSW spoke w daughter, Julaine Hua, by phone.  Says they have filed many grievances re care at SNF, issues include not checking O2 frequently enough, not bathing patient enough, issues w medications not being cut appropriately and then being "Black" after being cut.  Family sits w patient from 4 PM onward 7 days/week.. Although they are not satisfied w care received at facility, they understand that seeking another placement may mean placing patient further away.  They are willing to return to [facility] and continuing to advocate for patient care needs.  Edwyna Shell, LCSW Clinical Social Worker [phone])

## 2014-02-25 NOTE — Plan of Care (Signed)
Problem: ICU Phase Progression Outcomes Goal: Pain controlled with appropriate interventions Outcome: Not Applicable Date Met:  86/77/37 Pain meds not continued on admit orders Goal: Voiding-avoid urinary catheter unless indicated Outcome: Not Progressing Foley in place, placed in ED

## 2014-02-25 NOTE — Progress Notes (Signed)
[facility]                                                                              Patient Demographics  [name] [name], is a 80 y.o. male, DOB - [birth year], QBH:[id number]  Admit date - 02/24/2014   Admitting Physician Bonnielee Haff, MD  Outpatient Primary MD for the patient is [name], MD  LOS - 1   Chief Complaint  Patient presents with  . Altered Mental Status      HPI: [name] [name] is a 80 y.o. male with a past medical history of dementia, hypertension, Keppra, diabetes, seizure disorder, glaucoma, who lives in a skilled nursing facility. He was sent over from there because of altered mental status. Patient is currently on BiPAP and has advanced dementia and so is unable to provide any history. According to the emergency department records over the past 2-3 weeks ago. He has been sleeping more than normal. Today he was unresponsive. He was complaining of abdominal pain. He has history of hospitalization for UTI. He has a history of COPD. In the emergency department evaluation revealed hypercapnia. Patient was placed on BiPAP and his mental status has improved. Though he still remains confused due to his baseline dementia.   Assessment & Plan   Acute respiratory Failure with hypercapnia -Etiology unclear -Patient appears to have a history of mild CO2 retention on previous ABGs -Will attempt to wean off of BiPAP -ABG at admission, showed pCO2 110, which improved slightly to 82.2 after BiPAP -Will continue to monitor -Respiratory failure likely complicated by anxiety, will give small dose of Ativan  Acute encephalopathy -Likely secondary to hypercapnia -UA negative, CXR showed no infiltrates -CT head and CT abd/pelvis showed no acute abnormalities  Abnormal Chest Xray  -Suggestive of pulmonary edema/mild CHF -BNP 64, patient appears euvolemic -He was given a lasix at admission  Seizure Disorder -Continue Keppra and Depakote  Diabetes Mellitus,  Type 2 -Continue ISS and CBG monitoring -Hemoglobin A1c pending  Dementia -Continue aricept  Abdominal pain -No complaints, CT abd/pelvis did not show any acute findings  Depression -Continue Celexa  Code Status: DNR  Family Communication: None at bedside  Disposition Plan: Admitted, will discharge back to [facility] once stable.   Time Spent in minutes   30 minutes  Procedures  None  Consults   None  DVT Prophylaxis  Heparin  Lab Results  Component Value Date   PLT 165 02/25/2014    Medications  Scheduled Meds: . antiseptic oral rinse  15 mL Mouth Rinse q12n4p  . brimonidine  1 drop Both Eyes BID  . chlorhexidine  15 mL Mouth Rinse BID  . citalopram  10 mg Oral Daily  . divalproex  500 mg Oral TID  . donepezil  10 mg Oral QHS  . Dorzolamide HCl-Timolol Mal PF  1 drop Right Eye BID  . heparin  5,000 Units Subcutaneous 3 times per day  . insulin aspart  0-15 Units Subcutaneous 6 times per day  . latanoprost  1 drop Right Eye QHS  . levETIRAcetam  250 mg Intravenous Q12H  . pilocarpine  1 drop Right Eye QID  . sodium chloride  3  mL Intravenous Q12H   Continuous Infusions:  PRN Meds:.sodium chloride, acetaminophen, acetaminophen, albuterol, ondansetron (ZOFRAN) IV, ondansetron, sodium chloride  Antibiotics    Anti-infectives   None      Subjective:   [name] seen and examined today.  Patient feels he cannot breathe and is scared of dying.  He feels nervous.    Objective:   Filed Vitals:   02/25/14 0400 02/25/14 0500 02/25/14 0600 02/25/14 0748  BP: 129/61 139/88 162/88   Pulse:  115 102 113  Temp:      TempSrc:      Resp: 24 21 20 29   Height:      Weight:      SpO2: 94% 98% 98% 100%    Wt Readings from Last 3 Encounters:  02/25/14 82.5 kg (181 lb 14.1 oz)  04/28/13 78.9 kg (173 lb 15.1 oz)  10/18/12 74.1 kg (163 lb 5.8 oz)     Intake/Output Summary (Last 24 hours) at 02/25/14 0842 Last data filed at 02/25/14  0549  Gross per 24 hour  Intake  125.5 ml  Output   2000 ml  Net -1874.5 ml    Exam  General: Well developed, well nourished, NAD, appears stated age  HEENT: NCAT, PERRLA, EOMI, Anicteic Sclera, BiPAP in place  Neck: Supple, no JVD, no masses  Cardiovascular: S1 S2 auscultated, no rubs, murmurs or gallops. Regular rate and rhythm.  Respiratory: Coarse breath sounds B/L, no wheezing  Abdomen: Soft, nontender, nondistended, + bowel sounds  Extremities: warm dry without cyanosis clubbing or edema  Neuro: Unable to fully assess due to BiPAP, cranial nerves grossly intact. Hard of hearing.  No focal deficits  Skin: Without rashes exudates or nodules  Psych: anxious  Data Review   Micro Results Recent Results (from the past 240 hour(s))  MRSA PCR SCREENING     Status: None   Collection Time    02/25/14  2:27 AM      Result Value Ref Range Status   MRSA by PCR NEGATIVE  NEGATIVE Final   Comment:            The GeneXpert MRSA Assay (FDA     approved for NASAL specimens     only), is one component of a     comprehensive MRSA colonization     surveillance program. It is not     intended to diagnose MRSA     infection nor to guide or     monitor treatment for     MRSA infections.    Radiology Reports Ct Abdomen Pelvis Wo Contrast  02/25/2014   CLINICAL DATA:  Altered mental status.  EXAM: CT ABDOMEN AND PELVIS WITHOUT CONTRAST  TECHNIQUE: Multidetector CT imaging of the abdomen and pelvis was performed following the standard protocol without IV contrast.  COMPARISON:  02/27/2012 CT.  FINDINGS: Bones: No aggressive osseous lesions. Lumbar degenerative disease and Schmorl's nodes. Bilateral hip osteoarthritis.  Lung Bases: Multiple chronic rounded opacities over the hemidiaphragm bilaterally. These appear similar to the prior exam from 2013 although there is motion artifact on today's examination. The origin of these nodules is unclear. Regardless, given the chronicity, these  are almost certainly benign and probably related to asbestos related pleural disease.  Liver: Unenhanced CT was performed per clinician order. Lack of IV contrast limits sensitivity and specificity, especially for evaluation of abdominal/pelvic solid viscera. Grossly normal.  Spleen:  Normal.  Gallbladder:  Normal.  Common bile duct:  Normal.  Pancreas:  Normal.  Adrenal  glands:  Normal bilaterally.  Kidneys: No renal calculi. No hydronephrosis. Both ureters are within normal limits.  Stomach:  Normal.  Small bowel:  Normal.  Colon:   Normal.  Normal appendix.  Pelvic Genitourinary: Foley catheter. Collapsed urinary bladder. No free fluid.  Vasculature: Atherosclerosis without an acute vascular abnormality.  Body Wall: Fat containing left inguinal hernia. Probable right inguinal herniorrhaphy.  IMPRESSION: 1. No acute abnormality. 2. 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   Electronically Signed   By: Dereck Ligas M.D.   On: 02/25/2014 00:15   Ct Head Wo Contrast  02/25/2014   CLINICAL DATA:  Altered mental status worsening over last 2 weeks.  EXAM: CT HEAD WITHOUT CONTRAST  TECHNIQUE: Contiguous axial images were obtained from the base of the skull through the vertex without intravenous contrast.  COMPARISON:  10/17/2012.  FINDINGS: No mass lesion, mass effect, midline shift, hydrocephalus, hemorrhage. No acute territorial cortical ischemia/infarct. Atrophy and chronic ischemic white matter disease is present. Small lacune is present in the right basal ganglia. Metal object present along the right lateral globe, unchanged from prior.  IMPRESSION: Atrophy and chronic ischemic white matter disease with old right basal ganglia lacunar infarct. No acute intracranial abnormality.   Electronically Signed   By: Dereck Ligas M.D.   On: 02/25/2014 00:09   Dg Chest Portable 1 View  02/24/2014   CLINICAL DATA:  Dementia.  Bradycardia   EXAM: PORTABLE CHEST - 1 VIEW  COMPARISON:  None.  FINDINGS: Cardiomegaly is present. Pulmonary vascular congestion. Interstitial pulmonary edema. Basilar atelectasis. Monitoring leads project over the chest. Tortuous thoracic aorta appears similar to prior exam. No pleural effusion is identified.  IMPRESSION: Constellation of findings compatible with mild CHF.   Electronically Signed   By: Dereck Ligas M.D.   On: 02/24/2014 21:12    CBC  Recent Labs Lab 02/24/14 2107 02/25/14 0308  WBC 6.8 8.8  HGB 10.3* 10.5*  HCT 36.9* 37.4*  PLT 158 165  MCV 97.4 96.1  MCH 27.2 27.0  MCHC 27.9* 28.1*  RDW 15.4 15.5  LYMPHSABS 1.9  --   MONOABS 0.6  --   EOSABS 0.2  --   BASOSABS 0.0  --     Chemistries   Recent Labs Lab 02/24/14 2107 02/25/14 0308  NA 141 143  K 4.4 5.2  CL 93* 95*  CO2 44* 43*  GLUCOSE 117* 121*  BUN 15 14  CREATININE 0.88 0.80  CALCIUM 9.9 10.0  AST 9 9  ALT 5 <5  ALKPHOS 66 71  BILITOT <0.2* 0.2*   ------------------------------------------------------------------------------------------------------------------ estimated creatinine clearance is 74.3 ml/min (by C-G formula based on Cr of 0.8). ------------------------------------------------------------------------------------------------------------------ No results found for this basename: HGBA1C,  in the last 72 hours ------------------------------------------------------------------------------------------------------------------ No results found for this basename: CHOL, HDL, LDLCALC, TRIG, CHOLHDL, LDLDIRECT,  in the last 72 hours ------------------------------------------------------------------------------------------------------------------ No results found for this basename: TSH, T4TOTAL, FREET3, T3FREE, THYROIDAB,  in the last 72 hours ------------------------------------------------------------------------------------------------------------------ No results found for this basename: VITAMINB12,  FOLATE, FERRITIN, TIBC, IRON, RETICCTPCT,  in the last 72 hours  Coagulation profile  Recent Labs Lab 02/24/14 2107  INR 0.96    No results found for this basename: DDIMER,  in the last 72 hours  Cardiac Enzymes  Recent Labs Lab 02/24/14 2107 02/25/14 0300  TROPONINI <0.30 <0.30   ------------------------------------------------------------------------------------------------------------------ No components found with this basename: POCBNP,     MIKHAIL, MARYANN D.O. on 02/25/2014 at 8:42  AM  Between 7am to 7pm - Pager - [phone]  After 7pm go to [email or web address] - password [email or web address]  And look for the night coverage person covering for me after hours  [facility] Office  [phone]

## 2014-02-25 NOTE — H&P (Signed)
[facility] History and Physical  [name] [name] MVH:[id number] DOB: 06-26-1931 DOA: 02/24/2014   PCP: [name], MD  Specialists: Unknown  Chief Complaint: Altered mental status  HPI: [name] [name] is a 80 y.o. male with a past medical history of dementia, hypertension, Keppra, diabetes, seizure disorder, glaucoma, who lives in a skilled nursing facility. He was sent over from there because of altered mental status. Patient is currently on BiPAP and has advanced dementia and so is unable to provide any history. According to the emergency department records over the past 2-3 weeks ago. He has been sleeping more than normal. Today he was unresponsive. He was complaining of abdominal pain. He has history of hospitalization for UTI. He has a history of COPD. In the emergency department evaluation revealed hypercapnia. Patient was placed on BiPAP and his mental status has improved. Though he still remains confused due to his baseline dementia.  Home Medications: Prior to Admission medications   Medication Sig Start Date End Date Taking? Authorizing Provider  acetaminophen (TYLENOL) 650 MG CR tablet Take 650 mg by mouth 2 (two) times daily.   Yes Historical Provider, MD  albuterol (PROVENTIL) (2.5 MG/3ML) 0.083% nebulizer solution Take 2.5 mg by nebulization 4 (four) times daily. 08/26/12  Yes Modena Jansky, MD  amLODipine (NORVASC) 5 MG tablet Take 1 tablet (5 mg total) by mouth daily. 08/26/12  Yes Modena Jansky, MD  brimonidine (ALPHAGAN P) 0.1 % SOLN Place 1 drop into both eyes 2 (two) times daily.    Yes Historical Provider, MD  citalopram (CELEXA) 10 MG tablet Take 10 mg by mouth daily.   Yes Historical Provider, MD  divalproex (DEPAKOTE SPRINKLE) 125 MG capsule Take 500 mg by mouth 3 (three) times daily.   Yes Historical Provider, MD  donepezil (ARICEPT) 10 MG tablet Take 10 mg by mouth at bedtime.   Yes Historical Provider, MD  Dorzolamide HCl-Timolol Mal PF 22.3-6.8  MG/ML SOLN Place 1 drop into the right eye 2 (two) times daily.    Yes Historical Provider, MD  ferrous sulfate 325 (65 FE) MG EC tablet Take 325 mg by mouth 2 (two) times daily.   Yes Historical Provider, MD  latanoprost (XALATAN) 0.005 % ophthalmic solution Place 1 drop into the right eye at bedtime.    Yes Historical Provider, MD  levETIRAcetam (KEPPRA) 250 MG tablet Take 250 mg by mouth 2 (two) times daily.   Yes Historical Provider, MD  loratadine (CLARITIN) 10 MG tablet Take 10 mg by mouth daily.   Yes Historical Provider, MD  LORazepam (ATIVAN) 0.5 MG tablet Take 1 tablet (0.5 mg total) by mouth 2 (two) times daily. 05/01/13  Yes Samuella Cota, MD  lovastatin (MEVACOR) 40 MG tablet Take 40 mg by mouth at bedtime.     Yes Historical Provider, MD  metFORMIN (GLUCOPHAGE) 500 MG tablet Take 500 mg by mouth 2 (two) times daily with a meal.   Yes Historical Provider, MD  montelukast (SINGULAIR) 10 MG tablet Take 10 mg by mouth every morning.    Yes Historical Provider, MD  omeprazole (PRILOSEC) 20 MG capsule Take 20 mg by mouth daily.   Yes Historical Provider, MD  pilocarpine (PILOCAR) 1 % ophthalmic solution Place 1 drop into the right eye 4 (four) times daily.  07/14/12  Yes Historical Provider, MD  polyethylene glycol (MIRALAX / GLYCOLAX) packet Take 17 g by mouth daily. 08/26/12  Yes Modena Jansky, MD  senna-docusate (DOC-Q-LAX) 8.6-50 MG per tablet Take  1 tablet by mouth 2 (two) times daily.   Yes Historical Provider, MD  vitamin C (ASCORBIC ACID) 500 MG tablet Take 500 mg by mouth 2 (two) times daily.   Yes Historical Provider, MD    Allergies: No Known Allergies  Past Medical History: Past Medical History  Diagnosis Date  . Glaucoma   . Anxiety   . GERD (gastroesophageal reflux disease)   . Hyperlipidemia   . Hypertension   . Chronic abdominal pain   . 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 Chronic respiratory failure   . Altered mental status 06/12/2012;  08/14/2012    Unwitnessed isolated seizure suspected, but because of the EEG being normal, Dr. Merlene Laughter did not recommend antiseizure therapy.; "violent since 08/13/2012" (08/14/2012)  . Hard of hearing   . Seizure 06/14/2012    Isolated seizure suspected, but not confirmed.  . Bradycardia   . Complication of anesthesia 2005    "couldn't get him woke up when he should have after eye surgery" (08/14/2012)  . Mesothelioma   . Pleural plaque 04/2012  . Exertional dyspnea   . DM type 2 (diabetes mellitus, type 2) 06/13/2012  . UTI (lower urinary tract infection)     Past Surgical History  Procedure Laterality Date  . Inguinal hernia repair    . Prostate surgery    . Left eye surgery for glaucoma at [facility]  10/2009  . Cataract extraction w/ intraocular lens implant  2005    "left" (08/14/2012)    Social History: He lives in a skilled nursing facility. Otherwise history is unknown.  Family History:  Family History  Problem Relation Age of Onset  . Lung cancer Sister   . Breast cancer Sister      Review of Systems - unable to obtain due to his acute respiratory failure and dementia  Physical Examination  Filed Vitals:   02/24/14 2010 02/24/14 2124 02/24/14 2130 02/24/14 2335  BP: 114/63  118/76 117/69  Pulse: 78  82 89  Temp:  97.6 F (36.4 C)    TempSrc:  Rectal    Resp: _0 Height: _1  (1.778 m)     Weight: 88.451 kg (195 lb)     SpO2: 100%  98% 97%    BP 117/69  Pulse 89  Temp(Src) 97.6 F (36.4 C) (Rectal)  Resp 19  Ht _2  (1.778 m)  Wt 88.451 kg (195 lb)  BMI 27.98 kg/m2  SpO2 97%  General appearance: appears stated age, distracted and no distress Head: Normocephalic, without obvious abnormality, atraumatic Eyes: conjunctivae/corneas clear. PERRL, EOM's intact. Resp: Course breath sounds bilaterally. No crackles appreciated. No wheezing Cardio: regular rate and rhythm, S1, S2 normal, no murmur, click, rub or gallop GI: soft, non-tender; bowel sounds  normal; no masses,  no organomegaly Extremities: extremities normal, atraumatic, no cyanosis or edema Pulses: 2+ and symmetric Skin: Skin color, texture, turgor normal. No rashes or lesions Neurologic: He is alert, confused, hard of hearing. No focal neurological deficits otherwise.  Laboratory Data: Results for orders placed during the [name] encounter of 02/24/14 (from the past 48 hour(s))  CBC WITH DIFFERENTIAL     Status: Abnormal   Collection Time    02/24/14  9:07 PM      Result Value Ref Range   WBC 6.8  4.0 - 10.5 K/uL   RBC 3.79 (*) 4.22 - 5.81 MIL/uL   Hemoglobin 10.3 (*) 13.0 - 17.0 g/dL   HCT 36.9 (*)  39.0 - 52.0 %   MCV 97.4  78.0 - 100.0 fL   MCH 27.2  26.0 - 34.0 pg   MCHC 27.9 (*) 30.0 - 36.0 g/dL   RDW 15.4  11.5 - 15.5 %   Platelets 158  150 - 400 K/uL   Neutrophils Relative % 59  43 - 77 %   Neutro Abs 4.1  1.7 - 7.7 K/uL   Lymphocytes Relative 28  12 - 46 %   Lymphs Abs 1.9  0.7 - 4.0 K/uL   Monocytes Relative 10  3 - 12 %   Monocytes Absolute 0.6  0.1 - 1.0 K/uL   Eosinophils Relative 3  0 - 5 %   Eosinophils Absolute 0.2  0.0 - 0.7 K/uL   Basophils Relative 0  0 - 1 %   Basophils Absolute 0.0  0.0 - 0.1 K/uL  COMPREHENSIVE METABOLIC PANEL     Status: Abnormal   Collection Time    02/24/14  9:07 PM      Result Value Ref Range   Sodium 141  137 - 147 mEq/L   Potassium 4.4  3.7 - 5.3 mEq/L   Chloride 93 (*) 96 - 112 mEq/L   CO2 44 (*) 19 - 32 mEq/L   Comment: CRITICAL RESULT CALLED TO, READ BACK BY AND VERIFIED WITH:     KELLY,A ON 02/24/14 AT 2150 BY LOY,C   Glucose, Bld 117 (*) 70 - 99 mg/dL   BUN 15  6 - 23 mg/dL   Creatinine, Ser 0.88  0.50 - 1.35 mg/dL   Calcium 9.9  8.4 - 10.5 mg/dL   Total Protein 7.4  6.0 - 8.3 g/dL   Albumin 3.1 (*) 3.5 - 5.2 g/dL   AST 9  0 - 37 U/L   ALT 5  0 - 53 U/L   Alkaline Phosphatase 66  39 - 117 U/L   Total Bilirubin <0.2 (*) 0.3 - 1.2 mg/dL   GFR calc non Af Amer 79 (*) >90 mL/min   GFR calc Af Amer >90  >90  mL/min   Comment: (NOTE)     The eGFR has been calculated using the CKD EPI equation.     This calculation has not been validated in all clinical situations.     eGFR's persistently <90 mL/min signify possible Chronic Kidney     Disease.  AMMONIA     Status: None   Collection Time    02/24/14  9:07 PM      Result Value Ref Range   Ammonia 47  11 - 60 umol/L  TROPONIN I     Status: None   Collection Time    02/24/14  9:07 PM      Result Value Ref Range   Troponin I <0.30  <0.30 ng/mL   Comment:            Due to the release kinetics of cTnI,     a negative result within the first hours     of the onset of symptoms does not rule out     myocardial infarction with certainty.     If myocardial infarction is still suspected,     repeat the test at appropriate intervals.  PROTIME-INR     Status: None   Collection Time    02/24/14  9:07 PM      Result Value Ref Range   Prothrombin Time 12.6  11.6 - 15.2 seconds   INR 0.96  0.00 - 1.49  VALPROIC ACID LEVEL     Status: Abnormal   Collection Time    02/24/14  9:07 PM      Result Value Ref Range   Valproic Acid Lvl 49.5 (*) 50.0 - 100.0 ug/mL  BLOOD GAS, ARTERIAL     Status: Abnormal   Collection Time    02/24/14  9:08 PM      Result Value Ref Range   O2 Content 3.0     Delivery systems NASAL CANNULA     pH, Arterial 7.202 (*) 7.350 - 7.450   pCO2 arterial 110.0 (*) 35.0 - 45.0 mmHg   Comment: CRITICAL RESULT CALLED TO, READ BACK BY AND VERIFIED WITH:     AMANDA KELLY,RN BY K KNICK,RRT RCP ON 02/24/2014 AT 2112   pO2, Arterial 131.0 (*) 80.0 - 100.0 mmHg   Bicarbonate 41.6 (*) 20.0 - 24.0 mEq/L   TCO2 40.0  0 - 100 mmol/L   Acid-Base Excess 13.4 (*) 0.0 - 2.0 mmol/L   O2 Saturation 98.1     Patient temperature 37.0     Collection site RIGHT RADIAL     Drawn by [id number]     Sample type ARTERIAL     Allens test (pass/fail) PASS  PASS  URINALYSIS, ROUTINE W REFLEX MICROSCOPIC     Status: Abnormal   Collection Time    02/24/14   9:38 PM      Result Value Ref Range   Color, Urine YELLOW  YELLOW   APPearance CLEAR  CLEAR   Specific Gravity, Urine 1.025  1.005 - 1.030   pH 6.0  5.0 - 8.0   Glucose, UA NEGATIVE  NEGATIVE mg/dL   Hgb urine dipstick NEGATIVE  NEGATIVE   Bilirubin Urine NEGATIVE  NEGATIVE   Ketones, ur TRACE (*) NEGATIVE mg/dL   Protein, ur NEGATIVE  NEGATIVE mg/dL   Urobilinogen, UA 0.2  0.0 - 1.0 mg/dL   Nitrite NEGATIVE  NEGATIVE   Leukocytes, UA NEGATIVE  NEGATIVE   Comment: MICROSCOPIC NOT DONE ON URINES WITH NEGATIVE PROTEIN, BLOOD, LEUKOCYTES, NITRITE, OR GLUCOSE <1000 mg/dL.    Radiology Reports: Ct Abdomen Pelvis Wo Contrast  02/25/2014   CLINICAL DATA:  Altered mental status.  EXAM: CT ABDOMEN AND PELVIS WITHOUT CONTRAST  TECHNIQUE: Multidetector CT imaging of the abdomen and pelvis was performed following the standard protocol without IV contrast.  COMPARISON:  02/27/2012 CT.  FINDINGS: Bones: No aggressive osseous lesions. Lumbar degenerative disease and Schmorl's nodes. Bilateral hip osteoarthritis.  Lung Bases: Multiple chronic rounded opacities over the hemidiaphragm bilaterally. These appear similar to the prior exam from 2013 although there is motion artifact on today's examination. The origin of these nodules is unclear. Regardless, given the chronicity, these are almost certainly benign and probably related to asbestos related pleural disease.  Liver: Unenhanced CT was performed per clinician order. Lack of IV contrast limits sensitivity and specificity, especially for evaluation of abdominal/pelvic solid viscera. Grossly normal.  Spleen:  Normal.  Gallbladder:  Normal.  Common bile duct:  Normal.  Pancreas:  Normal.  Adrenal glands:  Normal bilaterally.  Kidneys: No renal calculi. No hydronephrosis. Both ureters are within normal limits.  Stomach:  Normal.  Small bowel:  Normal.  Colon:   Normal.  Normal appendix.  Pelvic Genitourinary: Foley catheter. Collapsed urinary bladder. No free  fluid.  Vasculature: Atherosclerosis without an acute vascular abnormality.  Body Wall: Fat containing left inguinal hernia. Probable right inguinal herniorrhaphy.  IMPRESSION: 1. No acute abnormality. 2. 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   Electronically Signed   By: Dereck Ligas M.D.   On: 02/25/2014 00:15   Ct Head Wo Contrast  02/25/2014   CLINICAL DATA:  Altered mental status worsening over last 2 weeks.  EXAM: CT HEAD WITHOUT CONTRAST  TECHNIQUE: Contiguous axial images were obtained from the base of the skull through the vertex without intravenous contrast.  COMPARISON:  10/17/2012.  FINDINGS: No mass lesion, mass effect, midline shift, hydrocephalus, hemorrhage. No acute territorial cortical ischemia/infarct. Atrophy and chronic ischemic white matter disease is present. Small lacune is present in the right basal ganglia. Metal object present along the right lateral globe, unchanged from prior.  IMPRESSION: Atrophy and chronic ischemic white matter disease with old right basal ganglia lacunar infarct. No acute intracranial abnormality.   Electronically Signed   By: Dereck Ligas M.D.   On: 02/25/2014 00:09   Dg Chest Portable 1 View  02/24/2014   CLINICAL DATA:  Dementia.  Bradycardia  EXAM: PORTABLE CHEST - 1 VIEW  COMPARISON:  None.  FINDINGS: Cardiomegaly is present. Pulmonary vascular congestion. Interstitial pulmonary edema. Basilar atelectasis. Monitoring leads project over the chest. Tortuous thoracic aorta appears similar to prior exam. No pleural effusion is identified.  IMPRESSION: Constellation of findings compatible with mild CHF.   Electronically Signed   By: Dereck Ligas M.D.   On: 02/24/2014 21:12    Electrocardiogram: Sinus rhythm in the 70s. Normal axis. Intervals are normal. Nonspecific ST and T wave changes noted, especially in V4, V5.  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 CO2 narcosis is the most likely reason for his alteration in mental status. Reason for CO2 narcosis is not clear. There has been no other symptoms. It is quite possible patient may have had a seizure. It is unclear if he received sedative agents in the skilled nursing facility. No clear infectious etiology is identified  Plan: #1 acute respiratory failure with hypercapnia: His mental status is improved with BiPAP. ABG will be repeated. He'll be admitted to step down unit.  #2 abnormal chest x-ray suggestive of pulmonary edema: His clinical examination did not reveal any crackles. We will check a pro BNP. We'll give him a low dose of Lasix. Monitor him closely. His initial troponin is normal. This will be repeated. He is afebrile. His WBC is normal.   #3 history of seizure disorder: It's quite possible he may have had a seizure. We will give Keppra intravenously. Monitor him closely in the [name]. Continue with his Depakote as well.  #4 history of diabetes mellitus, type II: Sliding scale insulin coverage will be initiated. Check HbA1c.  #5 history of glaucoma: Continue with his eye drops.  #6 history of dementia: Continue with his home medications.  #7 complains of abdominal pain: The abdomen is benign on examination. CT does not show any acute findings. Continue to monitor for now   DVT Prophylaxis: Heparin Code Status: DO NOT RESUSCITATE Family Communication: No family available  Disposition Plan: Admit to step down   Further management decisions will depend on results of further testing and patient's  response to  treatment.   [name] [name]  [facility] Pager [phone]  If 7PM-7AM, please contact night-coverage [email or web address] Password [email or web address]  02/25/2014, 1:11 AM  Disclaimer: This note was dictated with voice recognition software. Similar sounding words can inadvertently be transcribed and may not be corrected upon review.

## 2014-02-26 ENCOUNTER — Inpatient Hospital Stay (HOSPITAL_COMMUNITY): Payer: PRIVATE HEALTH INSURANCE

## 2014-02-26 DIAGNOSIS — R918 Other nonspecific abnormal finding of lung field: Secondary | ICD-10-CM

## 2014-02-26 DIAGNOSIS — D649 Anemia, unspecified: Secondary | ICD-10-CM

## 2014-02-26 DIAGNOSIS — E785 Hyperlipidemia, unspecified: Secondary | ICD-10-CM

## 2014-02-26 LAB — CBC
HCT: 37.1 % — ABNORMAL LOW (ref 39.0–52.0)
HEMOGLOBIN: 10.8 g/dL — AB (ref 13.0–17.0)
MCH: 27 pg (ref 26.0–34.0)
MCHC: 29.1 g/dL — ABNORMAL LOW (ref 30.0–36.0)
MCV: 92.8 fL (ref 78.0–100.0)
Platelets: 199 10*3/uL (ref 150–400)
RBC: 4 MIL/uL — AB (ref 4.22–5.81)
RDW: 15.7 % — ABNORMAL HIGH (ref 11.5–15.5)
WBC: 7.5 10*3/uL (ref 4.0–10.5)

## 2014-02-26 LAB — GLUCOSE, CAPILLARY
GLUCOSE-CAPILLARY: 149 mg/dL — AB (ref 70–99)
Glucose-Capillary: 120 mg/dL — ABNORMAL HIGH (ref 70–99)
Glucose-Capillary: 122 mg/dL — ABNORMAL HIGH (ref 70–99)
Glucose-Capillary: 123 mg/dL — ABNORMAL HIGH (ref 70–99)
Glucose-Capillary: 124 mg/dL — ABNORMAL HIGH (ref 70–99)
Glucose-Capillary: 126 mg/dL — ABNORMAL HIGH (ref 70–99)
Glucose-Capillary: 189 mg/dL — ABNORMAL HIGH (ref 70–99)

## 2014-02-26 LAB — BASIC METABOLIC PANEL
BUN: 20 mg/dL (ref 6–23)
CO2: 38 meq/L — AB (ref 19–32)
Calcium: 10.5 mg/dL (ref 8.4–10.5)
Chloride: 92 mEq/L — ABNORMAL LOW (ref 96–112)
Creatinine, Ser: 1.03 mg/dL (ref 0.50–1.35)
GFR calc Af Amer: 77 mL/min — ABNORMAL LOW (ref >90)
GFR calc non Af Amer: 66 mL/min — ABNORMAL LOW (ref >90)
GLUCOSE: 129 mg/dL — AB (ref 70–99)
Potassium: 4.8 mEq/L (ref 3.7–5.3)
SODIUM: 142 meq/L (ref 137–147)

## 2014-02-26 LAB — BLOOD GAS, ARTERIAL
Acid-Base Excess: 12.5 mmol/L — ABNORMAL HIGH (ref 0.0–2.0)
Bicarbonate: 37.7 mEq/L — ABNORMAL HIGH (ref 20.0–24.0)
Delivery systems: POSITIVE
Drawn by: 382351
Expiratory PAP: 5
FIO2: 0.28 %
Inspiratory PAP: 12
LHR: 6 {breaths}/min
O2 SAT: 93.3 %
PATIENT TEMPERATURE: 37
PH ART: 7.41 (ref 7.350–7.450)
TCO2: 34.8 mmol/L (ref 0–100)
pCO2 arterial: 60.7 mmHg (ref 35.0–45.0)
pO2, Arterial: 68.9 mmHg — ABNORMAL LOW (ref 80.0–100.0)

## 2014-02-26 LAB — URINE CULTURE
COLONY COUNT: NO GROWTH
CULTURE: NO GROWTH

## 2014-02-26 MED ORDER — LEVETIRACETAM 250 MG PO TABS
250.0000 mg | ORAL_TABLET | Freq: Two times a day (BID) | ORAL | Status: DC
  Administered 2014-02-26 – 2014-02-27 (×3): 250 mg via ORAL
  Filled 2014-02-26 (×7): qty 1

## 2014-02-26 NOTE — Clinical Social Work Note (Signed)
Discussed pt with MD. Probable return to [facility] tomorrow. Facility updated and aware pt will require Bipap at night.   [name] [name], [place]

## 2014-02-26 NOTE — Evaluation (Signed)
Clinical/Bedside Swallow Evaluation  Patient Details  Name: [name] [name] MRN: [id number] Date of Birth: 1931-03-30  Today's Date: 02/26/2014 Time: 1222-1258 SLP Time Calculation (min): 36 min  Past Medical History:  Past Medical History  Diagnosis Date  . Glaucoma   . Anxiety   . GERD (gastroesophageal reflux disease)   . Hyperlipidemia   . Hypertension   . Chronic abdominal pain   . 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 Chronic respiratory failure   . Altered mental status 06/12/2012; 08/14/2012    Unwitnessed isolated seizure suspected, but because of the EEG being normal, Dr. Merlene Laughter did not recommend antiseizure therapy.; "violent since 08/13/2012" (08/14/2012)  . Hard of hearing   . Seizure 06/14/2012    Isolated seizure suspected, but not confirmed.  . Bradycardia   . Complication of anesthesia 2005    "couldn't get him woke up when he should have after eye surgery" (08/14/2012)  . Mesothelioma   . Pleural plaque 04/2012  . Exertional dyspnea   . DM type 2 (diabetes mellitus, type 2) 06/13/2012  . UTI (lower urinary tract infection)    Past Surgical History:  Past Surgical History  Procedure Laterality Date  . Inguinal hernia repair    . Prostate surgery    . Left eye surgery for glaucoma at [facility]  10/2009  . Cataract extraction w/ intraocular lens implant  2005    "left" (08/14/2012)   HPI:  [name] [name] is a 80 y.o. male with a past medical history of dementia, hypertension, Keppra, diabetes, seizure disorder, glaucoma, who lives in a skilled nursing facility. He was sent over from there because of altered mental status. Patient is currently on BiPAP and has advanced dementia and so is unable to provide any history. According to the emergency department records over the past 2-3 weeks ago. He has been sleeping more than normal. Today he was unresponsive. He was complaining of abdominal pain. He has history of hospitalization for UTI. He has  a history of COPD. In the emergency department evaluation revealed hypercapnia. Patient was placed on BiPAP and his mental status has improved. Though he still remains confused due to his baseline dementia   Assessment / Plan / Recommendation Clinical Impression  [name] [name]'s daughter reports that pt consumes a mechanical soft diet with nectar-thick liquids at [facility]. He was downgraded to nectar-thick liquids during a hospitalization last August and reportedly tolerates it well. The pt. has glaucoma and kept is eyes shut throughout the evaluation. He inconsistently followed commands during oral motor examination. No gross abnormalities noted with the exception of edentulous status (daughter reports that dentures are being made). No overt signs or symptoms aspiration noted at bedside with ice chips, thin, puree or mech soft. Pt seems anxious and vocalized (like a humming noise with mouth open) throughout assessment even with food in mouth. Mild lingual residuals noted after trials mech soft. Will initiate mech soft diet and nectar-thick liquids for now and complete MBSS later this afternoon to objectively assess safety with thin liquids. His daughter does not think her father has ever had an objective study. Above to Dr. Ree Kida.    Aspiration Risk  Mild    Diet Recommendation Dysphagia 3 (Mechanical Soft);Nectar-thick liquid   Liquid Administration via: Cup Medication Administration: Crushed with puree Supervision: Staff to assist with self feeding Compensations: Slow rate;Check for pocketing Postural Changes and/or Swallow Maneuvers: Seated upright 90 degrees;Upright 30-60 min after meal    Other  Recommendations Recommended  Consults: MBS Oral Care Recommendations: Oral care BID Other Recommendations: Clarify dietary restrictions   Follow Up Recommendations       Frequency and Duration        Pertinent Vitals/Pain O2 via nasal canula    SLP Swallow Goals  Pending MBSS later  today.   Swallow Study Prior Functional Status   [facility]: D3/NTL    General Date of Onset: 02/24/14 HPI: [name] [name] is a 80 y.o. male with a past medical history of dementia, hypertension, Keppra, diabetes, seizure disorder, glaucoma, who lives in a skilled nursing facility. He was sent over from there because of altered mental status. Patient is currently on BiPAP and has advanced dementia and so is unable to provide any history. According to the emergency department records over the past 2-3 weeks ago. He has been sleeping more than normal. Today he was unresponsive. He was complaining of abdominal pain. He has history of hospitalization for UTI. He has a history of COPD. In the emergency department evaluation revealed hypercapnia. Patient was placed on BiPAP and his mental status has improved. Though he still remains confused due to his baseline dementia Type of Study: Bedside swallow evaluation Previous Swallow Assessment: August 2014 BSE: puree/NTL Diet Prior to this Study: NPO (mech soft and NTL at [facility]) Temperature Spikes Noted: No Respiratory Status: Nasal cannula (Bi-pap at night) History of Recent Intubation: No Behavior/Cognition: Alert;Cooperative;Pleasant mood;Hard of hearing;Requires cueing Oral Cavity - Dentition: Edentulous (Daughter reports that dentures are being made) Self-Feeding Abilities: Needs assist Patient Positioning: Upright in bed Baseline Vocal Quality: Clear Volitional Cough: Strong Volitional Swallow: Unable to elicit    Oral/Motor/Sensory Function Overall Oral Motor/Sensory Function: Appears within functional limits for tasks assessed   Ice Chips Ice chips: Impaired Presentation: Spoon Oral Phase Impairments: Reduced lingual movement/coordination Pharyngeal Phase Impairments: Suspected delayed Swallow   Thin Liquid Thin Liquid: Within functional limits Presentation: Cup;Straw    Nectar Thick Nectar Thick Liquid: Not tested   Honey Thick Honey  Thick Liquid: Not tested   Puree Puree: Within functional limits Presentation: Spoon   Solid   Thank you,  [name] [name], CCC-SLP [phone]     Solid: Impaired (mech soft) Presentation: Spoon Oral Phase Impairments: Reduced lingual movement/coordination Oral Phase Functional Implications: Oral residue       PORTER,DABNEY 02/26/2014,2:18 PM

## 2014-02-26 NOTE — Progress Notes (Addendum)
[facility]                                                                              Patient Demographics  [name] [name], is a 80 y.o. male, DOB - 05-25-1931, IWP:[id number]  Admit date - 02/24/2014   Admitting Physician Bonnielee Haff, MD  Outpatient Primary MD for the patient is [name], MD  LOS - 2   Chief Complaint  Patient presents with  . Altered Mental Status      HPI: [name] [name] is a 80 y.o. male with a past medical history of dementia, hypertension, Keppra, diabetes, seizure disorder, glaucoma, who lives in a skilled nursing facility. He was sent over from there because of altered mental status. Patient is currently on BiPAP and has advanced dementia and so is unable to provide any history. According to the emergency department records over the past 2-3 weeks ago. He has been sleeping more than normal. Today he was unresponsive. He was complaining of abdominal pain. He has history of hospitalization for UTI. He has a history of COPD. In the emergency department evaluation revealed hypercapnia. Patient was placed on BiPAP and his mental status has improved. Though he still remains confused due to his baseline dementia.   Assessment & Plan   Acute respiratory Failure with hypercapnia -Etiology unclear, possibly related to COPD.  Patient has history of ?mesothelioma/pleural plaques -Patient appears to have a history of mild CO2 retention on previous ABGs -Currently on nasal canuala (was initially on BiPAP) -ABG at admission, showed pCO2 110, currently 60 -Will continue to monitor -Respiratory failure likely complicated by anxiety, will give small dose of Ativan -Repeat CXR: Decreased expansion of the right lung, with right basilar airspace opacity, which may reflect atelectasis or pneumonia. -Pulmonology consulted and appreciated.  Recommended zosyn and solumedrol. -Patient started on zosyn for aspiration coverage -Will order speech eval  Acute  encephalopathy -Likely secondary to hypercapnia -UA negative, CXR showed no infiltrates -CT head and CT abd/pelvis showed no acute abnormalities  Abnormal Chest Xray  -Suggestive of pulmonary edema/mild CHF -BNP 64, patient appears euvolemic -He was given a lasix at admission  Seizure Disorder -Continue Keppra and Depakote  Diabetes Mellitus, Type 2 -Continue ISS and CBG monitoring -Hemoglobin A1c 5.8  Dementia -Continue aricept  Abdominal pain -No complaints, CT abd/pelvis did not show any acute findings  Depression -Continue Celexa  Normocytic Anemia -baseline hemoglobin appears to be around 10 -Currently at baseline, and stable  Code Status: DNR  Family Communication: None at bedside  Disposition Plan: Admitted, will discharge back to [place] once stable.   Time Spent in minutes   30 minutes  Procedures  None  Consults   None  DVT Prophylaxis  Heparin  Lab Results  Component Value Date   PLT 199 02/26/2014    Medications  Scheduled Meds: . albuterol  2.5 mg Nebulization Q4H  . antiseptic oral rinse  15 mL Mouth Rinse q12n4p  . brimonidine  1 drop Both Eyes BID  . chlorhexidine  15 mL Mouth Rinse BID  . citalopram  10 mg Oral Daily  . divalproex  500 mg Oral TID  . donepezil  10 mg  Oral QHS  . dorzolamide-timolol  1 drop Right Eye BID  . heparin  5,000 Units Subcutaneous 3 times per day  . insulin aspart  0-15 Units Subcutaneous 6 times per day  . latanoprost  1 drop Right Eye QHS  . levETIRAcetam  250 mg Intravenous Q12H  . methylPREDNISolone (SOLU-MEDROL) injection  40 mg Intravenous Q6H  . pilocarpine  1 drop Right Eye QID  . sodium chloride  3 mL Intravenous Q12H   Continuous Infusions:  PRN Meds:.sodium chloride, acetaminophen, acetaminophen, albuterol, LORazepam, ondansetron (ZOFRAN) IV, ondansetron, sodium chloride  Antibiotics    Anti-infectives   None      Subjective:   [name] [name] seen and examined  today.  Patient feels he cannot breathe and states his stomach is tight.  Patient is very hard of hearing.      Objective:   Filed Vitals:   02/26/14 0447 02/26/14 0448 02/26/14 0500 02/26/14 0800  BP:  149/83 106/63   Pulse:  84 93   Temp:  99.6 F (37.6 C)  99.3 F (37.4 C)  TempSrc:    Oral  Resp:  18    Height:      Weight:   82.4 kg (181 lb 10.5 oz)   SpO2: 95% 95% 97%     Wt Readings from Last 3 Encounters:  02/26/14 82.4 kg (181 lb 10.5 oz)  04/28/13 78.9 kg (173 lb 15.1 oz)  10/18/12 74.1 kg (163 lb 5.8 oz)     Intake/Output Summary (Last 24 hours) at 02/26/14 5732 Last data filed at 02/26/14 0500  Gross per 24 hour  Intake      0 ml  Output   1250 ml  Net  -1250 ml    Exam  General: Well developed, well nourished, NAD, appears stated age  HEENT: NCAT, PERRLA, EOMI, Anicteic Sclera  Neck: Supple, no JVD, no masses  Cardiovascular: S1 S2 auscultated, no rubs, murmurs or gallops. Regular rate and rhythm.  Respiratory: Diminished breath sounds B/L, no wheezing  Abdomen: Soft, nontender, nondistended, + bowel sounds  Extremities: warm dry without cyanosis clubbing  Neuro: Awake and alert.  Unable to assess orientation. Very hard of hearing.  Able to move all extremities.  Skin: Without rashes exudates or nodules  Psych: anxious  Data Review   Micro Results Recent Results (from the past 240 hour(s))  MRSA PCR SCREENING     Status: None   Collection Time    02/25/14  2:27 AM      Result Value Ref Range Status   MRSA by PCR NEGATIVE  NEGATIVE Final   Comment:            The GeneXpert MRSA Assay (FDA     approved for NASAL specimens     only), is one component of a     comprehensive MRSA colonization     surveillance program. It is not     intended to diagnose MRSA     infection nor to guide or     monitor treatment for     MRSA infections.    Radiology Reports Ct Abdomen Pelvis Wo Contrast  02/25/2014   CLINICAL DATA:  Altered mental  status.  EXAM: CT ABDOMEN AND PELVIS WITHOUT CONTRAST  TECHNIQUE: Multidetector CT imaging of the abdomen and pelvis was performed following the standard protocol without IV contrast.  COMPARISON:  02/27/2012 CT.  FINDINGS: Bones: No aggressive osseous lesions. Lumbar degenerative disease and Schmorl's nodes. Bilateral hip osteoarthritis.  Lung Bases: Multiple  chronic rounded opacities over the hemidiaphragm bilaterally. These appear similar to the prior exam from 2013 although there is motion artifact on today's examination. The origin of these nodules is unclear. Regardless, given the chronicity, these are almost certainly benign and probably related to asbestos related pleural disease.  Liver: Unenhanced CT was performed per clinician order. Lack of IV contrast limits sensitivity and specificity, especially for evaluation of abdominal/pelvic solid viscera. Grossly normal.  Spleen:  Normal.  Gallbladder:  Normal.  Common bile duct:  Normal.  Pancreas:  Normal.  Adrenal glands:  Normal bilaterally.  Kidneys: No renal calculi. No hydronephrosis. Both ureters are within normal limits.  Stomach:  Normal.  Small bowel:  Normal.  Colon:   Normal.  Normal appendix.  Pelvic Genitourinary: Foley catheter. Collapsed urinary bladder. No free fluid.  Vasculature: Atherosclerosis without an acute vascular abnormality.  Body Wall: Fat containing left inguinal hernia. Probable right inguinal herniorrhaphy.  IMPRESSION: 1. No acute abnormality. 2. 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   Electronically Signed   By: Dereck Ligas M.D.   On: 02/25/2014 00:15   Ct Head Wo Contrast  02/25/2014   CLINICAL DATA:  Altered mental status worsening over last 2 weeks.  EXAM: CT HEAD WITHOUT CONTRAST  TECHNIQUE: Contiguous axial images were obtained from the base of the skull through the vertex without intravenous contrast.  COMPARISON:  10/17/2012.   FINDINGS: No mass lesion, mass effect, midline shift, hydrocephalus, hemorrhage. No acute territorial cortical ischemia/infarct. Atrophy and chronic ischemic white matter disease is present. Small lacune is present in the right basal ganglia. Metal object present along the right lateral globe, unchanged from prior.  IMPRESSION: Atrophy and chronic ischemic white matter disease with old right basal ganglia lacunar infarct. No acute intracranial abnormality.   Electronically Signed   By: Dereck Ligas M.D.   On: 02/25/2014 00:09   Dg Chest Portable 1 View  02/24/2014   CLINICAL DATA:  Dementia.  Bradycardia  EXAM: PORTABLE CHEST - 1 VIEW  COMPARISON:  None.  FINDINGS: Cardiomegaly is present. Pulmonary vascular congestion. Interstitial pulmonary edema. Basilar atelectasis. Monitoring leads project over the chest. Tortuous thoracic aorta appears similar to prior exam. No pleural effusion is identified.  IMPRESSION: Constellation of findings compatible with mild CHF.   Electronically Signed   By: Dereck Ligas M.D.   On: 02/24/2014 21:12    CBC  Recent Labs Lab 02/24/14 2107 02/25/14 0308 02/26/14 0430  WBC 6.8 8.8 7.5  HGB 10.3* 10.5* 10.8*  HCT 36.9* 37.4* 37.1*  PLT 158 165 199  MCV 97.4 96.1 92.8  MCH 27.2 27.0 27.0  MCHC 27.9* 28.1* 29.1*  RDW 15.4 15.5 15.7*  LYMPHSABS 1.9  --   --   MONOABS 0.6  --   --   EOSABS 0.2  --   --   BASOSABS 0.0  --   --     Chemistries   Recent Labs Lab 02/24/14 2107 02/25/14 0308 02/26/14 0430  NA 141 143 142  K 4.4 5.2 4.8  CL 93* 95* 92*  CO2 44* 43* 38*  GLUCOSE 117* 121* 129*  BUN 15 14 20   CREATININE 0.88 0.80 1.03  CALCIUM 9.9 10.0 10.5  AST 9 9  --   ALT 5 <5  --   ALKPHOS 66 71  --   BILITOT <0.2* 0.2*  --    ------------------------------------------------------------------------------------------------------------------ estimated creatinine clearance is 57.6 ml/min (by C-G formula based on  Cr of  1.03). ------------------------------------------------------------------------------------------------------------------  Recent Labs  02/25/14 0308  HGBA1C 5.8*   ------------------------------------------------------------------------------------------------------------------ No results found for this basename: CHOL, HDL, LDLCALC, TRIG, CHOLHDL, LDLDIRECT,  in the last 72 hours ------------------------------------------------------------------------------------------------------------------ No results found for this basename: TSH, T4TOTAL, FREET3, T3FREE, THYROIDAB,  in the last 72 hours ------------------------------------------------------------------------------------------------------------------ No results found for this basename: VITAMINB12, FOLATE, FERRITIN, TIBC, IRON, RETICCTPCT,  in the last 72 hours  Coagulation profile  Recent Labs Lab 02/24/14 2107  INR 0.96    No results found for this basename: DDIMER,  in the last 72 hours  Cardiac Enzymes  Recent Labs Lab 02/24/14 2107 02/25/14 0300  TROPONINI <0.30 <0.30   ------------------------------------------------------------------------------------------------------------------ No components found with this basename: POCBNP,     Janmarie Smoot D.O. on 02/26/2014 at 8:12 AM  Between 7am to 7pm - Pager - [phone]  After 7pm go to [email or web address] - password [email or web address]  And look for the night coverage person covering for me after hours  [facility] Office  [phone]

## 2014-02-26 NOTE — Procedures (Signed)
Objective Swallowing Evaluation: Modified Barium Swallowing Study  Patient Details  Name: [name] [name] MRN: [id number] Date of Birth: 12/12/1930  Today's Date: 02/26/2014 Time: 8416-6063 SLP Time Calculation (min): 31 min  Past Medical History:  Past Medical History  Diagnosis Date  . Glaucoma   . Anxiety   . GERD (gastroesophageal reflux disease)   . Hyperlipidemia   . Hypertension   . Chronic abdominal pain   . 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 Chronic respiratory failure   . Altered mental status 06/12/2012; 08/14/2012    Unwitnessed isolated seizure suspected, but because of the EEG being normal, Dr. Merlene Laughter did not recommend antiseizure therapy.; "violent since 08/13/2012" (08/14/2012)  . Hard of hearing   . Seizure 06/14/2012    Isolated seizure suspected, but not confirmed.  . Bradycardia   . Complication of anesthesia 2005    "couldn't get him woke up when he should have after eye surgery" (08/14/2012)  . Mesothelioma   . Pleural plaque 04/2012  . Exertional dyspnea   . DM type 2 (diabetes mellitus, type 2) 06/13/2012  . UTI (lower urinary tract infection)    Past Surgical History:  Past Surgical History  Procedure Laterality Date  . Inguinal hernia repair    . Prostate surgery    . Left eye surgery for glaucoma at [facility]  10/2009  . Cataract extraction w/ intraocular lens implant  2005    "left" (08/14/2012)   HPI:  [name] [name] is a 80 y.o. male with a past medical history of dementia, hypertension, Keppra, diabetes, seizure disorder, glaucoma, who lives in a skilled nursing facility. He was sent over from there because of altered mental status. Patient is currently on BiPAP and has advanced dementia and so is unable to provide any history. According to the emergency department records over the past 2-3 weeks ago. He has been sleeping more than normal. Today he was unresponsive. He was complaining of abdominal pain. He has history of  hospitalization for UTI. He has a history of COPD. In the emergency department evaluation revealed hypercapnia. Patient was placed on BiPAP and his mental status has improved. Though he still remains confused due to his baseline dementia     Assessment / Plan / Recommendation Clinical Impression  Dysphagia Diagnosis: Mild oral phase dysphagia;Mild pharyngeal phase dysphagia  Clinical impression: Mild oropharyngeal phase dysphagia exacerbated by pt positioning (pt moaning with head arched back and needed frequent cues for repositioning-c/o discomfort with catheter) characterized by mild reduced lingual movement resulting in prolonged oral transit/brief oral holding with liquids and premature spillage/delay in swallow initiation to valleculae consistent across textures/consistencies. Pt aspirated to the cords a trace amount of thin before the swallow when taking pill and thin (premature spillage), however spontaneously expelled. Increased residuals noted in pyriforms and at CP segment when taking straw sips of thin. Pt benefitted from cue to repeat swallow after liquids to help clear the trace/mild residuals. Recommend D3/mech soft and thin liquids when pt is alert and upright- SMALL sips from straw ok. Medication taken whole in puree or crushed in puree as able. Do not give pills whole with liquid as this increases his risk for aspiration.     Treatment Recommendation  Defer treatment plan to SLP at (Comment) ([facility])    Diet Recommendation Dysphagia 3 (Mechanical Soft);Thin liquid   Liquid Administration via: Cup;Straw (single sips only) Medication Administration: Whole meds with puree Supervision: Staff to assist with self feeding Compensations: Slow rate;Check for  pocketing;Small sips/bites;Multiple dry swallows after each bite/sip Postural Changes and/or Swallow Maneuvers: Seated upright 90 degrees;Upright 30-60 min after meal    Other  Recommendations Recommended Consults: MBS Oral Care  Recommendations: Oral care BID Other Recommendations: Clarify dietary restrictions   Follow Up Recommendations  Skilled Nursing facility    Frequency and Duration        Pertinent Vitals/Pain Nasal cannula for O2    SLP Swallow Goals  Pt will likely be discharged to [facility] tomorrow. Recommend f/u with SLP for diet tolerance and pt/caregiver education    General Date of Onset: 02/24/14 HPI: [name] [name] is a 80 y.o. male with a past medical history of dementia, hypertension, Keppra, diabetes, seizure disorder, glaucoma, who lives in a skilled nursing facility. He was sent over from there because of altered mental status. Patient is currently on BiPAP and has advanced dementia and so is unable to provide any history. According to the emergency department records over the past 2-3 weeks ago. He has been sleeping more than normal. Today he was unresponsive. He was complaining of abdominal pain. He has history of hospitalization for UTI. He has a history of COPD. In the emergency department evaluation revealed hypercapnia. Patient was placed on BiPAP and his mental status has improved. Though he still remains confused due to his baseline dementia Type of Study: Modified Barium Swallowing Study Reason for Referral: Objectively evaluate swallowing function Previous Swallow Assessment: BSE 02/26/2014: D3/NTL Diet Prior to this Study: Dysphagia 3 (soft);Nectar-thick liquids Temperature Spikes Noted: No Respiratory Status: Nasal cannula History of Recent Intubation: No Behavior/Cognition: Alert;Cooperative;Pleasant mood;Hard of hearing;Requires cueing Oral Cavity - Dentition: Edentulous Oral Motor / Sensory Function: Within functional limits Self-Feeding Abilities: Needs assist Patient Positioning: Upright in chair Baseline Vocal Quality: Clear Volitional Cough: Strong Volitional Swallow: Unable to elicit Anatomy: Within functional limits Pharyngeal Secretions: Not observed secondary MBS     Reason for Referral Objectively evaluate swallowing function   Oral Phase Oral Preparation/Oral Phase Oral Phase: WFL   Pharyngeal Phase Pharyngeal Phase Pharyngeal Phase: Impaired Pharyngeal - Nectar Pharyngeal - Nectar Straw: Delayed swallow initiation;Premature spillage to valleculae;Lateral channel residue;Pharyngeal residue - pyriform sinuses (mild residuals in pharynx) Pharyngeal - Thin Pharyngeal - Thin Cup: Premature spillage to valleculae;Delayed swallow initiation;Pharyngeal residue - cp segment Pharyngeal - Thin Straw: Premature spillage to valleculae;Premature spillage to pyriform sinuses;Reduced laryngeal elevation;Reduced airway/laryngeal closure;Penetration/Aspiration during swallow;Lateral channel residue;Pharyngeal residue - cp segment (trace residuals in lateral channels) Penetration/Aspiration details (thin straw): Material does not enter airway;Material enters airway, remains ABOVE vocal cords then ejected out Pharyngeal - Solids Pharyngeal - Puree: Within functional limits Pharyngeal - Mechanical Soft: Premature spillage to valleculae;Within functional limits Pharyngeal - Pill: Penetration/Aspiration before swallow (trace aspiration to the cords of thin when taking pill) Penetration/Aspiration details (pill): Material enters airway, CONTACTS cords then ejected out  Cervical Esophageal Phase      Thank you,  Genene Churn, CCC-SLP [phone]  Cervical Esophageal Phase Cervical Esophageal Phase: [facility]         PORTER,DABNEY 02/26/2014, 3:31 PM

## 2014-02-26 NOTE — Progress Notes (Signed)
Subjective: He is much more alert this morning. He looks comfortable on nasal oxygen now.  Objective: Vital signs in last 24 hours: Temp:  [98.7 F (37.1 C)-99.6 F (37.6 C)] 99.3 F (37.4 C) (06/19 0800) Pulse Rate:  [78-111] 89 (06/19 0800) Resp:  [18-48] 18 (06/19 0448) BP: (72-183)/(50-96) 95/53 mmHg (06/19 0800) SpO2:  [78 %-100 %] 100 % (06/19 0800) FiO2 (%):  [28 %-32 %] 28 % (06/19 0447) Weight:  [82.4 kg (181 lb 10.5 oz)] 82.4 kg (181 lb 10.5 oz) (06/19 0500) Weight change: -6.052 kg (-13 lb 5.5 oz) Last BM Date: 02/24/14  Intake/Output from previous day: 06/18 0701 - 06/19 0700 In: 205 [IV Piggyback:205] Out: 1250 [Urine:1250]  PHYSICAL EXAM General appearance: alert, cooperative and no distress Resp: rhonchi bilaterally Cardio: regular rate and rhythm, S1, S2 normal, no murmur, click, rub or gallop GI: soft, non-tender; bowel sounds normal; no masses,  no organomegaly Extremities: extremities normal, atraumatic, no cyanosis or edema  Lab Results:  Results for orders placed during the hospital encounter of 02/24/14 (from the past 48 hour(s))  CBC WITH DIFFERENTIAL     Status: Abnormal   Collection Time    02/24/14  9:07 PM      Result Value Ref Range   WBC 6.8  4.0 - 10.5 K/uL   RBC 3.79 (*) 4.22 - 5.81 MIL/uL   Hemoglobin 10.3 (*) 13.0 - 17.0 g/dL   HCT 36.9 (*) 39.0 - 52.0 %   MCV 97.4  78.0 - 100.0 fL   MCH 27.2  26.0 - 34.0 pg   MCHC 27.9 (*) 30.0 - 36.0 g/dL   RDW 15.4  11.5 - 15.5 %   Platelets 158  150 - 400 K/uL   Neutrophils Relative % 59  43 - 77 %   Neutro Abs 4.1  1.7 - 7.7 K/uL   Lymphocytes Relative 28  12 - 46 %   Lymphs Abs 1.9  0.7 - 4.0 K/uL   Monocytes Relative 10  3 - 12 %   Monocytes Absolute 0.6  0.1 - 1.0 K/uL   Eosinophils Relative 3  0 - 5 %   Eosinophils Absolute 0.2  0.0 - 0.7 K/uL   Basophils Relative 0  0 - 1 %   Basophils Absolute 0.0  0.0 - 0.1 K/uL  COMPREHENSIVE METABOLIC PANEL     Status: Abnormal   Collection Time     02/24/14  9:07 PM      Result Value Ref Range   Sodium 141  137 - 147 mEq/L   Potassium 4.4  3.7 - 5.3 mEq/L   Chloride 93 (*) 96 - 112 mEq/L   CO2 44 (*) 19 - 32 mEq/L   Comment: CRITICAL RESULT CALLED TO, READ BACK BY AND VERIFIED WITH:     KELLY,A ON 02/24/14 AT 2150 BY LOY,C   Glucose, Bld 117 (*) 70 - 99 mg/dL   BUN 15  6 - 23 mg/dL   Creatinine, Ser 0.88  0.50 - 1.35 mg/dL   Calcium 9.9  8.4 - 10.5 mg/dL   Total Protein 7.4  6.0 - 8.3 g/dL   Albumin 3.1 (*) 3.5 - 5.2 g/dL   AST 9  0 - 37 U/L   ALT 5  0 - 53 U/L   Alkaline Phosphatase 66  39 - 117 U/L   Total Bilirubin <0.2 (*) 0.3 - 1.2 mg/dL   GFR calc non Af Amer 79 (*) >90 mL/min   GFR calc Af  Amer >90  >90 mL/min   Comment: (NOTE)     The eGFR has been calculated using the CKD EPI equation.     This calculation has not been validated in all clinical situations.     eGFR's persistently <90 mL/min signify possible Chronic Kidney     Disease.  AMMONIA     Status: None   Collection Time    02/24/14  9:07 PM      Result Value Ref Range   Ammonia 47  11 - 60 umol/L  TROPONIN I     Status: None   Collection Time    02/24/14  9:07 PM      Result Value Ref Range   Troponin I <0.30  <0.30 ng/mL   Comment:            Due to the release kinetics of cTnI,     a negative result within the first hours     of the onset of symptoms does not rule out     myocardial infarction with certainty.     If myocardial infarction is still suspected,     repeat the test at appropriate intervals.  PROTIME-INR     Status: None   Collection Time    02/24/14  9:07 PM      Result Value Ref Range   Prothrombin Time 12.6  11.6 - 15.2 seconds   INR 0.96  0.00 - 1.49  VALPROIC ACID LEVEL     Status: Abnormal   Collection Time    02/24/14  9:07 PM      Result Value Ref Range   Valproic Acid Lvl 49.5 (*) 50.0 - 100.0 ug/mL  BLOOD GAS, ARTERIAL     Status: Abnormal   Collection Time    02/24/14  9:08 PM      Result Value Ref Range   O2  Content 3.0     Delivery systems NASAL CANNULA     pH, Arterial 7.202 (*) 7.350 - 7.450   pCO2 arterial 110.0 (*) 35.0 - 45.0 mmHg   Comment: CRITICAL RESULT CALLED TO, READ BACK BY AND VERIFIED WITH:     AMANDA KELLY,RN BY K KNICK,RRT RCP ON 02/24/2014 AT 2112   pO2, Arterial 131.0 (*) 80.0 - 100.0 mmHg   Bicarbonate 41.6 (*) 20.0 - 24.0 mEq/L   TCO2 40.0  0 - 100 mmol/L   Acid-Base Excess 13.4 (*) 0.0 - 2.0 mmol/L   O2 Saturation 98.1     Patient temperature 37.0     Collection site RIGHT RADIAL     Drawn by [id number]     Sample type ARTERIAL     Allens test (pass/fail) PASS  PASS  URINALYSIS, ROUTINE W REFLEX MICROSCOPIC     Status: Abnormal   Collection Time    02/24/14  9:38 PM      Result Value Ref Range   Color, Urine YELLOW  YELLOW   APPearance CLEAR  CLEAR   Specific Gravity, Urine 1.025  1.005 - 1.030   pH 6.0  5.0 - 8.0   Glucose, UA NEGATIVE  NEGATIVE mg/dL   Hgb urine dipstick NEGATIVE  NEGATIVE   Bilirubin Urine NEGATIVE  NEGATIVE   Ketones, ur TRACE (*) NEGATIVE mg/dL   Protein, ur NEGATIVE  NEGATIVE mg/dL   Urobilinogen, UA 0.2  0.0 - 1.0 mg/dL   Nitrite NEGATIVE  NEGATIVE   Leukocytes, UA NEGATIVE  NEGATIVE   Comment: MICROSCOPIC NOT DONE ON URINES WITH NEGATIVE PROTEIN, BLOOD, LEUKOCYTES,  NITRITE, OR GLUCOSE <1000 mg/dL.  MRSA PCR SCREENING     Status: None   Collection Time    02/25/14  2:27 AM      Result Value Ref Range   MRSA by PCR NEGATIVE  NEGATIVE   Comment:            The GeneXpert MRSA Assay (FDA     approved for NASAL specimens     only), is one component of a     comprehensive MRSA colonization     surveillance program. It is not     intended to diagnose MRSA     infection nor to guide or     monitor treatment for     MRSA infections.  BLOOD GAS, ARTERIAL     Status: Abnormal   Collection Time    02/25/14  3:00 AM      Result Value Ref Range   FIO2 0.32     Delivery systems BILEVEL POSITIVE AIRWAY PRESSURE     Rate 6     Inspiratory PAP  12     Expiratory PAP 5     pH, Arterial 7.312 (*) 7.350 - 7.450   pCO2 arterial 82.2 (*) 35.0 - 45.0 mmHg   Comment: CRITICAL RESULT CALLED TO, READ BACK BY AND VERIFIED WITH:     TORI HOWELL,RN BY BRIDGET STOPHEL RRT,RCP ON 02/25/14 AT 3:04.   pO2, Arterial 66.8 (*) 80.0 - 100.0 mmHg   Bicarbonate 40.3 (*) 20.0 - 24.0 mEq/L   TCO2 38.0  0 - 100 mmol/L   Acid-Base Excess 13.7 (*) 0.0 - 2.0 mmol/L   O2 Saturation 92.0     Patient temperature 37.0     Collection site RIGHT RADIAL     Drawn by [id number]     Sample type ARTERIAL DRAW     Allens test (pass/fail) PASS  PASS  PRO B NATRIURETIC PEPTIDE     Status: None   Collection Time    02/25/14  3:00 AM      Result Value Ref Range   Pro B Natriuretic peptide (BNP) 64.0  0 - 450 pg/mL  TROPONIN I     Status: None   Collection Time    02/25/14  3:00 AM      Result Value Ref Range   Troponin I <0.30  <0.30 ng/mL   Comment:            Due to the release kinetics of cTnI,     a negative result within the first hours     of the onset of symptoms does not rule out     myocardial infarction with certainty.     If myocardial infarction is still suspected,     repeat the test at appropriate intervals.  HEMOGLOBIN A1C     Status: Abnormal   Collection Time    02/25/14  3:08 AM      Result Value Ref Range   Hemoglobin A1C 5.8 (*) <5.7 %   Comment: (NOTE)                                                                               According to the ADA Clinical  Practice Recommendations for 2011, when     HbA1c is used as a screening test:      >=6.5%   Diagnostic of Diabetes Mellitus               (if abnormal result is confirmed)     5.7-6.4%   Increased risk of developing Diabetes Mellitus     References:Diagnosis and Classification of Diabetes Mellitus,Diabetes     ZSWF,0932,35(TDDUK 1):S62-S69 and Standards of Medical Care in             Diabetes - 2011,Diabetes GURK,2706,23 (Suppl 1):S11-S61.   Mean Plasma Glucose 120 (*) <117 mg/dL    Comment: Performed at [facility]     Status: Abnormal   Collection Time    02/25/14  3:08 AM      Result Value Ref Range   Sodium 143  137 - 147 mEq/L   Potassium 5.2  3.7 - 5.3 mEq/L   Chloride 95 (*) 96 - 112 mEq/L   CO2 43 (*) 19 - 32 mEq/L   Comment: CRITICAL RESULT CALLED TO, READ BACK BY AND VERIFIED WITH:     HOWELL T AT 0347 ON [id number] BY FORSYTH K   Glucose, Bld 121 (*) 70 - 99 mg/dL   BUN 14  6 - 23 mg/dL   Creatinine, Ser 0.80  0.50 - 1.35 mg/dL   Calcium 10.0  8.4 - 10.5 mg/dL   Total Protein 7.7  6.0 - 8.3 g/dL   Albumin 3.3 (*) 3.5 - 5.2 g/dL   AST 9  0 - 37 U/L   ALT <5  0 - 53 U/L   Alkaline Phosphatase 71  39 - 117 U/L   Total Bilirubin 0.2 (*) 0.3 - 1.2 mg/dL   GFR calc non Af Amer 82 (*) >90 mL/min   GFR calc Af Amer >90  >90 mL/min   Comment: (NOTE)     The eGFR has been calculated using the CKD EPI equation.     This calculation has not been validated in all clinical situations.     eGFR's persistently <90 mL/min signify possible Chronic Kidney     Disease.  CBC     Status: Abnormal   Collection Time    02/25/14  3:08 AM      Result Value Ref Range   WBC 8.8  4.0 - 10.5 K/uL   RBC 3.89 (*) 4.22 - 5.81 MIL/uL   Hemoglobin 10.5 (*) 13.0 - 17.0 g/dL   HCT 37.4 (*) 39.0 - 52.0 %   MCV 96.1  78.0 - 100.0 fL   MCH 27.0  26.0 - 34.0 pg   MCHC 28.1 (*) 30.0 - 36.0 g/dL   RDW 15.5  11.5 - 15.5 %   Platelets 165  150 - 400 K/uL  GLUCOSE, CAPILLARY     Status: Abnormal   Collection Time    02/25/14  3:21 AM      Result Value Ref Range   Glucose-Capillary 110 (*) 70 - 99 mg/dL   Comment 1 Documented in Chart    GLUCOSE, CAPILLARY     Status: Abnormal   Collection Time    02/25/14  7:38 AM      Result Value Ref Range   Glucose-Capillary 105 (*) 70 - 99 mg/dL  GLUCOSE, CAPILLARY     Status: None   Collection Time    02/25/14 11:33 AM      Result Value Ref Range   Glucose-Capillary  97  70 - 99 mg/dL  GLUCOSE,  CAPILLARY     Status: None   Collection Time    02/25/14  4:32 PM      Result Value Ref Range   Glucose-Capillary 97  70 - 99 mg/dL  GLUCOSE, CAPILLARY     Status: Abnormal   Collection Time    02/25/14  7:45 PM      Result Value Ref Range   Glucose-Capillary 111 (*) 70 - 99 mg/dL   Comment 1 Notify RN    GLUCOSE, CAPILLARY     Status: Abnormal   Collection Time    02/26/14 12:03 AM      Result Value Ref Range   Glucose-Capillary 126 (*) 70 - 99 mg/dL   Comment 1 Notify RN    GLUCOSE, CAPILLARY     Status: Abnormal   Collection Time    02/26/14  3:45 AM      Result Value Ref Range   Glucose-Capillary 123 (*) 70 - 99 mg/dL   Comment 1 Notify RN    CBC     Status: Abnormal   Collection Time    02/26/14  4:30 AM      Result Value Ref Range   WBC 7.5  4.0 - 10.5 K/uL   RBC 4.00 (*) 4.22 - 5.81 MIL/uL   Hemoglobin 10.8 (*) 13.0 - 17.0 g/dL   HCT 37.1 (*) 39.0 - 52.0 %   MCV 92.8  78.0 - 100.0 fL   MCH 27.0  26.0 - 34.0 pg   MCHC 29.1 (*) 30.0 - 36.0 g/dL   RDW 15.7 (*) 11.5 - 15.5 %   Platelets 199  150 - 400 K/uL  BASIC METABOLIC PANEL     Status: Abnormal   Collection Time    02/26/14  4:30 AM      Result Value Ref Range   Sodium 142  137 - 147 mEq/L   Potassium 4.8  3.7 - 5.3 mEq/L   Chloride 92 (*) 96 - 112 mEq/L   CO2 38 (*) 19 - 32 mEq/L   Glucose, Bld 129 (*) 70 - 99 mg/dL   BUN 20  6 - 23 mg/dL   Creatinine, Ser 1.03  0.50 - 1.35 mg/dL   Calcium 10.5  8.4 - 10.5 mg/dL   GFR calc non Af Amer 66 (*) >90 mL/min   GFR calc Af Amer 77 (*) >90 mL/min   Comment: (NOTE)     The eGFR has been calculated using the CKD EPI equation.     This calculation has not been validated in all clinical situations.     eGFR's persistently <90 mL/min signify possible Chronic Kidney     Disease.  BLOOD GAS, ARTERIAL     Status: Abnormal   Collection Time    02/26/14  5:00 AM      Result Value Ref Range   FIO2 0.28     Delivery systems BILEVEL POSITIVE AIRWAY PRESSURE     Rate 6      Inspiratory PAP 12     Expiratory PAP 5     pH, Arterial 7.410  7.350 - 7.450   pCO2 arterial 60.7 (*) 35.0 - 45.0 mmHg   Comment: CRITICAL RESULT CALLED TO, READ BACK BY AND VERIFIED WITH:     William Hamburger, RN BY BRIDGET STOPHEL RRT,RCP ON 02/26/14 AT 05:03.   pO2, Arterial 68.9 (*) 80.0 - 100.0 mmHg   Bicarbonate 37.7 (*) 20.0 - 24.0 mEq/L  TCO2 34.8  0 - 100 mmol/L   Acid-Base Excess 12.5 (*) 0.0 - 2.0 mmol/L   O2 Saturation 93.3     Patient temperature 37.0     Collection site RIGHT RADIAL     Drawn by [id number]     Sample type ARTERIAL DRAW     Allens test (pass/fail) PASS  PASS  GLUCOSE, CAPILLARY     Status: Abnormal   Collection Time    02/26/14  7:52 AM      Result Value Ref Range   Glucose-Capillary 149 (*) 70 - 99 mg/dL   Comment 1 Documented in Chart     Comment 2 Notify RN      ABGS  Recent Labs  02/26/14 0500  PHART 7.410  PO2ART 68.9*  TCO2 34.8  HCO3 37.7*   CULTURES Recent Results (from the past 240 hour(s))  MRSA PCR SCREENING     Status: None   Collection Time    02/25/14  2:27 AM      Result Value Ref Range Status   MRSA by PCR NEGATIVE  NEGATIVE Final   Comment:            The GeneXpert MRSA Assay (FDA     approved for NASAL specimens     only), is one component of a     comprehensive MRSA colonization     surveillance program. It is not     intended to diagnose MRSA     infection nor to guide or     monitor treatment for     MRSA infections.   Studies/Results: Ct Abdomen Pelvis Wo Contrast  02/25/2014   CLINICAL DATA:  Altered mental status.  EXAM: CT ABDOMEN AND PELVIS WITHOUT CONTRAST  TECHNIQUE: Multidetector CT imaging of the abdomen and pelvis was performed following the standard protocol without IV contrast.  COMPARISON:  02/27/2012 CT.  FINDINGS: Bones: No aggressive osseous lesions. Lumbar degenerative disease and Schmorl's nodes. Bilateral hip osteoarthritis.  Lung Bases: Multiple chronic rounded opacities over the hemidiaphragm  bilaterally. These appear similar to the prior exam from 2013 although there is motion artifact on today's examination. The origin of these nodules is unclear. Regardless, given the chronicity, these are almost certainly benign and probably related to asbestos related pleural disease.  Liver: Unenhanced CT was performed per clinician order. Lack of IV contrast limits sensitivity and specificity, especially for evaluation of abdominal/pelvic solid viscera. Grossly normal.  Spleen:  Normal.  Gallbladder:  Normal.  Common bile duct:  Normal.  Pancreas:  Normal.  Adrenal glands:  Normal bilaterally.  Kidneys: No renal calculi. No hydronephrosis. Both ureters are within normal limits.  Stomach:  Normal.  Small bowel:  Normal.  Colon:   Normal.  Normal appendix.  Pelvic Genitourinary: Foley catheter. Collapsed urinary bladder. No free fluid.  Vasculature: Atherosclerosis without an acute vascular abnormality.  Body Wall: Fat containing left inguinal hernia. Probable right inguinal herniorrhaphy.  IMPRESSION: 1. No acute abnormality. 2. 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   Electronically Signed   By: Dereck Ligas M.D.   On: 02/25/2014 00:15   Ct Head Wo Contrast  02/25/2014   CLINICAL DATA:  Altered mental status worsening over last 2 weeks.  EXAM: CT HEAD WITHOUT CONTRAST  TECHNIQUE: Contiguous axial images were obtained from the base of the skull through the vertex without intravenous contrast.  COMPARISON:  10/17/2012.  FINDINGS: No mass lesion, mass effect, midline shift, hydrocephalus, hemorrhage.  No acute territorial cortical ischemia/infarct. Atrophy and chronic ischemic white matter disease is present. Small lacune is present in the right basal ganglia. Metal object present along the right lateral globe, unchanged from prior.  IMPRESSION: Atrophy and chronic ischemic white matter disease with old right basal ganglia lacunar  infarct. No acute intracranial abnormality.   Electronically Signed   By: Dereck Ligas M.D.   On: 02/25/2014 00:09   Dg Chest Port 1 View  02/26/2014   CLINICAL DATA:  Respiratory failure.  EXAM: PORTABLE CHEST - 1 VIEW  COMPARISON:  Chest radiograph from 02/24/2014  FINDINGS: There is decreased expansion of the right lung, with right basilar airspace opacity, which may reflect atelectasis or pneumonia. Pulmonary vascularity is at the upper limits of normal. Scattered nodular plaques are again seen bilaterally, likely reflecting prior asbestos exposure. The left mid lung nodule may have increased in size, but is difficult to fully characterize on radiograph.  The cardiomediastinal silhouette is normal in size. No acute osseous abnormalities are seen.  IMPRESSION: 1. Decreased expansion of the right lung, with right basilar airspace opacity, which may reflect atelectasis or pneumonia. 2. Scattered nodular plaques again noted bilaterally, likely reflecting prior asbestos exposure. The left mid lung nodule may have increased in size but is difficult to fully characterize on radiograph.   Electronically Signed   By: Garald Balding M.D.   On: 02/26/2014 03:38   Dg Chest Portable 1 View  02/24/2014   CLINICAL DATA:  Dementia.  Bradycardia  EXAM: PORTABLE CHEST - 1 VIEW  COMPARISON:  None.  FINDINGS: Cardiomegaly is present. Pulmonary vascular congestion. Interstitial pulmonary edema. Basilar atelectasis. Monitoring leads project over the chest. Tortuous thoracic aorta appears similar to prior exam. No pleural effusion is identified.  IMPRESSION: Constellation of findings compatible with mild CHF.   Electronically Signed   By: Dereck Ligas M.D.   On: 02/24/2014 21:12    Medications:  Prior to Admission:  Prescriptions prior to admission  Medication Sig Dispense Refill  . acetaminophen (TYLENOL) 650 MG CR tablet Take 650 mg by mouth 2 (two) times daily.      Marland Kitchen albuterol (PROVENTIL) (2.5 MG/3ML) 0.083%  nebulizer solution Take 2.5 mg by nebulization 4 (four) times daily.      Marland Kitchen amLODipine (NORVASC) 5 MG tablet Take 1 tablet (5 mg total) by mouth daily.  30 tablet  0  . brimonidine (ALPHAGAN P) 0.1 % SOLN Place 1 drop into both eyes 2 (two) times daily.       . citalopram (CELEXA) 10 MG tablet Take 10 mg by mouth daily.      . divalproex (DEPAKOTE SPRINKLE) 125 MG capsule Take 500 mg by mouth 3 (three) times daily.      Marland Kitchen donepezil (ARICEPT) 10 MG tablet Take 10 mg by mouth at bedtime.      . Dorzolamide HCl-Timolol Mal PF 22.3-6.8 MG/ML SOLN Place 1 drop into the right eye 2 (two) times daily.       . ferrous sulfate 325 (65 FE) MG EC tablet Take 325 mg by mouth 2 (two) times daily.      Marland Kitchen latanoprost (XALATAN) 0.005 % ophthalmic solution Place 1 drop into the right eye at bedtime.       . levETIRAcetam (KEPPRA) 250 MG tablet Take 250 mg by mouth 2 (two) times daily.      Marland Kitchen loratadine (CLARITIN) 10 MG tablet Take 10 mg by mouth daily.      Marland Kitchen LORazepam (ATIVAN) 0.5  MG tablet Take 1 tablet (0.5 mg total) by mouth 2 (two) times daily.  10 tablet  0  . lovastatin (MEVACOR) 40 MG tablet Take 40 mg by mouth at bedtime.        . metFORMIN (GLUCOPHAGE) 500 MG tablet Take 500 mg by mouth 2 (two) times daily with a meal.      . montelukast (SINGULAIR) 10 MG tablet Take 10 mg by mouth every morning.       Marland Kitchen omeprazole (PRILOSEC) 20 MG capsule Take 20 mg by mouth daily.      . pilocarpine (PILOCAR) 1 % ophthalmic solution Place 1 drop into the right eye 4 (four) times daily.       . polyethylene glycol (MIRALAX / GLYCOLAX) packet Take 17 g by mouth daily.      Marland Kitchen senna-docusate (DOC-Q-LAX) 8.6-50 MG per tablet Take 1 tablet by mouth 2 (two) times daily.      . vitamin C (ASCORBIC ACID) 500 MG tablet Take 500 mg by mouth 2 (two) times daily.       Scheduled: . albuterol  2.5 mg Nebulization Q4H  . antiseptic oral rinse  15 mL Mouth Rinse q12n4p  . brimonidine  1 drop Both Eyes BID  . chlorhexidine  15 mL  Mouth Rinse BID  . citalopram  10 mg Oral Daily  . divalproex  500 mg Oral TID  . donepezil  10 mg Oral QHS  . dorzolamide-timolol  1 drop Right Eye BID  . heparin  5,000 Units Subcutaneous 3 times per day  . insulin aspart  0-15 Units Subcutaneous 6 times per day  . latanoprost  1 drop Right Eye QHS  . levETIRAcetam  250 mg Intravenous Q12H  . methylPREDNISolone (SOLU-MEDROL) injection  40 mg Intravenous Q6H  . pilocarpine  1 drop Right Eye QID  . sodium chloride  3 mL Intravenous Q12H   Continuous:  CBU:LAGTXM chloride, acetaminophen, acetaminophen, albuterol, LORazepam, ondansetron (ZOFRAN) IV, ondansetron, sodium chloride  Assesment: He was admitted with acute on chronic respiratory failure. He had CO2 narcosis. He is markedly improved after her BiPAP steroids antibiotics and inhaled bronchodilators. 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

## 2014-02-26 NOTE — Care Management Note (Unsigned)
    Page 1 of 1   02/26/2014     4:43:46 PM CARE MANAGEMENT NOTE 02/26/2014  Patient:  [name] [name], [name] [name]   Account Number:  [id number]  Date Initiated:  02/26/2014  Documentation initiated by:  Vladimir Creeks  Subjective/Objective Assessment:   Pt admitted with AMS and respiratory failure. He is from [facility] and will return to [facility] at D/C. CSW aware     Action/Plan:   Anticipated DC Date:  03/01/2014   Anticipated DC Plan:  SKILLED NURSING FACILITY  In-house referral  Clinical Social Worker      DC Planning Services  CM consult      Choice offered to / List presented to:             Status of service:  In process, will continue to follow Medicare Important Message given?   (If response is "NO", the following Medicare IM given date fields will be blank) Date Medicare IM given:   Date Additional Medicare IM given:    Discharge Disposition:    Per UR Regulation:  Reviewed for med. necessity/level of care/duration of stay  If discussed at [place] of Stay Meetings, dates discussed:    Comments:  02/26/14 [place] RN/CM

## 2014-02-27 LAB — BLOOD GAS, ARTERIAL
ACID-BASE EXCESS: 12 mmol/L — AB (ref 0.0–2.0)
Acid-base deficit: 10.9 mmol/L — ABNORMAL HIGH (ref 0.0–2.0)
Bicarbonate: 37.3 mEq/L — ABNORMAL HIGH (ref 20.0–24.0)
DELIVERY SYSTEMS: POSITIVE
DRAWN BY: 317771
Expiratory PAP: 5
INSPIRATORY PAP: 12
O2 Content: 2 L/min
O2 Saturation: 94.7 %
PCO2 ART: 61.2 mmHg — AB (ref 35.0–45.0)
TCO2: 34.2 mmol/L (ref 0–100)
pH, Arterial: 7.402 (ref 7.350–7.450)
pO2, Arterial: 81.1 mmHg (ref 80.0–100.0)

## 2014-02-27 LAB — BASIC METABOLIC PANEL
BUN: 24 mg/dL — ABNORMAL HIGH (ref 6–23)
CHLORIDE: 93 meq/L — AB (ref 96–112)
CO2: 37 meq/L — AB (ref 19–32)
Calcium: 10.2 mg/dL (ref 8.4–10.5)
Creatinine, Ser: 1.03 mg/dL (ref 0.50–1.35)
GFR calc Af Amer: 77 mL/min — ABNORMAL LOW (ref >90)
GFR, EST NON AFRICAN AMERICAN: 66 mL/min — AB (ref >90)
GLUCOSE: 141 mg/dL — AB (ref 70–99)
POTASSIUM: 4.5 meq/L (ref 3.7–5.3)
Sodium: 140 mEq/L (ref 137–147)

## 2014-02-27 LAB — CBC
HCT: 34.8 % — ABNORMAL LOW (ref 39.0–52.0)
HEMOGLOBIN: 10.4 g/dL — AB (ref 13.0–17.0)
MCH: 27 pg (ref 26.0–34.0)
MCHC: 29.9 g/dL — AB (ref 30.0–36.0)
MCV: 90.4 fL (ref 78.0–100.0)
Platelets: 191 10*3/uL (ref 150–400)
RBC: 3.85 MIL/uL — AB (ref 4.22–5.81)
RDW: 15.8 % — ABNORMAL HIGH (ref 11.5–15.5)
WBC: 7.1 10*3/uL (ref 4.0–10.5)

## 2014-02-27 LAB — GLUCOSE, CAPILLARY
GLUCOSE-CAPILLARY: 130 mg/dL — AB (ref 70–99)
GLUCOSE-CAPILLARY: 137 mg/dL — AB (ref 70–99)
GLUCOSE-CAPILLARY: 141 mg/dL — AB (ref 70–99)
Glucose-Capillary: 166 mg/dL — ABNORMAL HIGH (ref 70–99)

## 2014-02-27 MED ORDER — LORAZEPAM 0.5 MG PO TABS: 0.5000 mg | ORAL_TABLET | Freq: Two times a day (BID) | ORAL | Status: DC

## 2014-02-27 MED ORDER — LATANOPROST 0.005 % OP SOLN
OPHTHALMIC | Status: AC
  Filled 2014-02-27: qty 2.5

## 2014-02-27 MED ORDER — AMOXICILLIN-POT CLAVULANATE 875-125 MG PO TABS: 1.0000 | ORAL_TABLET | Freq: Two times a day (BID) | ORAL | Status: DC

## 2014-02-27 MED ORDER — PREDNISONE (PAK) 10 MG PO TABS: ORAL_TABLET | Freq: Every day | ORAL | Status: DC

## 2014-02-27 MED ORDER — BIOTENE DRY MOUTH MT LIQD: 15.0000 mL | Freq: Two times a day (BID) | OROMUCOSAL | Status: DC

## 2014-02-27 NOTE — Discharge Instructions (Signed)
Acute Respiratory Distress Syndrome  Acute respiratory distress syndrome (ARDS) is a serious, life-threatening lung condition that can cause breathing failure. It occurs in people who are critically ill or in people who have had a serious injury.  CAUSES  ARDS occurs when small blood vessels in the lungs leak fluid into the air sacs (alveoli) of the lungs. The fluid causes the lungs to become "stiff" and decreases the lungs' ability to inflate. The fluid also prevents oxygen from being absorbed into the bloodstream. When the bloodstream does not have enough oxygen, the body's vital organs do not get enough oxygen to function properly. ARDS can occur in the following conditions:  Sepsis. This is a serious bloodstream infection.  Serious injury (trauma) to the head or chest.  Pneumonia.  After major surgery, such as a lung transplant.  Drug overdose.  Breathing (inhalation) of harmful chemicals. SYMPTOMS  ARDS comes on quickly (rapid onset) and can occur within 24 to 48 hours of an infection, illness, surgery, or injury. Symptoms include:  Shortness of breath or difficulty breathing.  Cyanosis. This is a bluish color to the skin or nailbeds (due to low oxygen levels in the blood).  Fast or irregular heart rate.  Low blood pressure (hypotension).  Organ failure. DIAGNOSIS  There is not a specific test to diagnose ARDS. It is usually diagnosed when other diseases and conditions that cause similar symtpoms have been ruled out. When a person is thought to have ARDS, the following tests may be performed:  A chest X-ray or computed tomography (CT) scan to look at the lungs.  Arterial blood gas (ABG) analysis. This test looks at the oxygen level in the blood.  Blood tests to rule out infection.  Sputum culture to rule out a lung infection.  Bronchoscopy. TREATMENT  ARDS is a critical condition. People who develop ARDS need to be in a hospital intensive care unit (ICU). Treatment of  ARDS includes:  Providing oxygenation. This is a main treatment goal of ARDS. A breathing machine (ventilator) is often used to help a person breathe and to provide oxygen. When on a breathing machine, medicine is given to keep patients asleep (sedated).  Treatment of the underlying cause of ARDS (infection, illness, or trauma).  Supportive treatment such as:  Intravenous (IV) fluids.  Liquid nutrition that goes through an IV or feeding tube.  Blood pressure medicine to support low blood pressure.  Antibiotic medicine to help fight infection.  Steroid medicine to help decrease swelling (inflammation) in the lungs.  Diuretic medicine to get rid of extra fluid in the body. HOME CARE INSTRUCTIONS  After recovering from ARDS, you may have weakness, shortness of breath, or memory problems. You may also suffer from depression or from complications of the illness that caused ARDS. You can do several things to help your recovery:  Do not smoke.  If you drink alcohol, limit the amount of alcohol you drink to 1 or 2 drinks a day.  Be sure you get a yearly flu (influenza) shot. You should get a pneumonia vaccine once every 5 years.  Ask your caregiver about lung rehabilitation programs.  Ask your caregiver about local support groups for people with breathing problems.  Ask friends and family to help you if daily activities make you tired. SEEK MEDICAL CARE IF:   You become short of breath with activity or while at rest.  You develop a cough that does not go away. SEEK IMMEDIATE MEDICAL CARE IF:   You have sudden  shortness of breath with or without chest pain.  You have chest pain that does not go away.  You develop swelling or pain in one of your legs.  You have trauma to your chest or any other part of your body.  You have a fever.  You overdose or have a reaction to your medicine. MAKE SURE YOU:   Understand these instructions.  Will watch your condition.  Will get  help right away if you are not doing well or get worse. Document Released: 08/27/2005 Document Revised: 11/19/2011 Document Reviewed: 05/16/2011 [facility] Patient Information 2015 [place], [place]. This information is not intended to replace advice given to you by your health care provider. Make sure you discuss any questions you have with your health care provider.

## 2014-02-27 NOTE — Progress Notes (Signed)
He is being transferred back to the skilled care facility today. He will need BiPAP at night. He is overall significantly improved and is much more alert. He is high risk of aspiration and he is going to be treated for that. He will need followup chest x-ray in about a month to document improvement of the infiltrate  I will of course sign off at this point. Thanks for allowing me to see him with you

## 2014-02-27 NOTE — Discharge Summary (Addendum)
Physician Discharge Summary  [name] [name] YQM:[id number] DOB: March 11, 1931 DOA: 02/24/2014  PCP: [name], MD  Admit date: 02/24/2014 Discharge date: 02/27/2014  Time spent: 45 minutes  Recommendations for Outpatient Follow-up:  Patient will be discharged back to [facility].  He will need to be placed on BiPAP at night.  Patient should follow up with his primary care physician as well as his physician at the nursing home.  Patient should continue his medications as prescribed.  Patient should continue a dysphagia 3 diet.   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 male with a past medical history of dementia, hypertension, Keppra, diabetes, seizure disorder, glaucoma, who lives in a skilled nursing facility. He was sent over from there because of altered mental status. Patient is currently on BiPAP and has advanced dementia and so is unable to provide any history. According to the emergency department records over the past 2-3 weeks ago. He has been sleeping more than normal. Today he was unresponsive. He was complaining of abdominal pain. He has history of hospitalization for UTI. He has a history of COPD. In the emergency department evaluation revealed hypercapnia. Patient was placed on BiPAP and his mental status has improved. Though he still remains confused due to his baseline dementia.  Hospital Course:  Acute respiratory Failure with  hypercapnia  -Etiology unclear, possibly related to COPD. Patient has history of ?mesothelioma/pleural plaques  -Patient appears to have a history of mild CO2 retention on previous ABGs  -Currently on nasal canuala (was initially on BiPAP)  -ABG at admission, showed pCO2 110, currently 61 -Respiratory failure likely complicated by anxiety, will give small dose of Ativan  -Repeat CXR: Decreased expansion of the right lung, with right basilar airspace opacity, which may reflect atelectasis or pneumonia.  -Pulmonology consulted and appreciated. Recommended zosyn and solumedrol.  -Initially placed on zosyn for aspiration coverage -Will discharge patient with Augmentin and oral steroids -Speech was consulted for evaluation and swallow study, and recommended dysphagia 3 diet, thin liquuids -Patient will be discharged back to nursing home today with BiPAP use at night  Acute encephalopathy  -Likely secondary to hypercapnia  -UA negative, CXR showed no infiltrates  -CT head and CT abd/pelvis showed no acute abnormalities   Abnormal Chest Xray  -Suggestive of pulmonary edema/mild CHF  -BNP 64, patient appears euvolemic  -He was given a lasix at admission   Seizure Disorder  -Continue Keppra and Depakote   Diabetes Mellitus, Type 2  -Continue ISS and CBG monitoring  -Hemoglobin A1c 5.8   Dementia  -Continue aricept   Abdominal pain  -No complaints, CT abd/pelvis did not show any acute findings   Depression  -Continue Celexa   Normocytic Anemia  -baseline hemoglobin appears to be around 10  -Currently at baseline, and stable   Procedures: None  Consultations: Pulmonology, Dr. Luan Pulling  Discharge Exam: Filed Vitals:   02/27/14 5284  BP:   Pulse:   Temp: 99.1 F (37.3 C)  Resp:    Exam  General: Well developed, well nourished, NAD, appears stated age  HEENT: NCAT, mucous membranes moist Neck: Supple, no JVD, no masses  Cardiovascular: S1 S2 auscultated, no rubs,  murmurs or gallops. Regular rate and rhythm.  Respiratory: Diminished breath sounds B/L, no wheezing  Abdomen: Soft, nontender, nondistended, + bowel sounds  Extremities: warm dry without cyanosis clubbing  Neuro: Awake and alert, oriented to person and place.  No focal deficits Skin: Without rashes exudates or nodules  Psych: Appropriate mood and affect  Discharge Instructions      Discharge Instructions   Bipap    Complete by:  As directed   QHS, FiO2 30%, 12/6     Discharge instructions    Complete by:  As directed   Patient will be discharged back to [facility].  He will need to be placed on BiPAP at night.  Patient should follow up with his primary care physician as well as his physician at the nursing home.  Patient should continue his medications as prescribed.  Patient should continue a dysphagia 3 diet.            Medication List         acetaminophen 650 MG CR tablet  Commonly known as:  TYLENOL  Take 650 mg by mouth 2 (two) times daily.     albuterol (2.5 MG/3ML) 0.083% nebulizer solution  Commonly known as:  PROVENTIL  Take 2.5 mg by nebulization 4 (four) times daily.     ALPHAGAN P 0.1 % Soln  Generic drug:  brimonidine  Place 1 drop into both eyes 2 (two) times daily.     amLODipine 5 MG tablet  Commonly known as:  NORVASC  Take 1 tablet (5 mg total) by mouth daily.     amoxicillin-clavulanate 875-125 MG per tablet  Commonly known as:  AUGMENTIN  Take 1 tablet by mouth 2 (two) times daily.     antiseptic oral rinse Liqd  15 mLs by Mouth Rinse route 2 times daily at 12 noon and 4 pm.     citalopram 10 MG tablet  Commonly known as:  CELEXA  Take 10 mg by mouth daily.     divalproex 125 MG capsule  Commonly known as:  DEPAKOTE SPRINKLE  Take 500 mg by mouth 3 (three) times daily.     DOC-Q-LAX 8.6-50 MG per tablet  Generic drug:  senna-docusate  Take 1 tablet by mouth 2 (two) times daily.     donepezil 10 MG tablet  Commonly known as:   ARICEPT  Take 10 mg by mouth at bedtime.     Dorzolamide HCl-Timolol Mal PF 22.3-6.8 MG/ML Soln  Place 1 drop into the right eye 2 (two) times daily.     ferrous sulfate 325 (65 FE) MG EC tablet  Take 325 mg by mouth 2 (two) times daily.     latanoprost 0.005 % ophthalmic solution  Commonly known as:  XALATAN  Place 1 drop into the right eye at bedtime.     levETIRAcetam 250 MG tablet  Commonly known as:  KEPPRA  Take 250 mg by mouth 2 (two) times daily.     loratadine 10 MG tablet  Commonly known as:  CLARITIN  Take 10 mg by mouth daily.     LORazepam 0.5 MG tablet  Commonly known as:  ATIVAN  Take 1 tablet (0.5 mg total) by mouth 2 (two)  times daily.     lovastatin 40 MG tablet  Commonly known as:  MEVACOR  Take 40 mg by mouth at bedtime.     metFORMIN 500 MG tablet  Commonly known as:  GLUCOPHAGE  Take 500 mg by mouth 2 (two) times daily with a meal.     omeprazole 20 MG capsule  Commonly known as:  PRILOSEC  Take 20 mg by mouth daily.     pilocarpine 1 % ophthalmic solution  Commonly known as:  PILOCAR  Place 1 drop into the right eye 4 (four) times daily.     polyethylene glycol packet  Commonly known as:  MIRALAX / GLYCOLAX  Take 17 g by mouth daily.     predniSONE 10 MG tablet  Commonly known as:  STERAPRED UNI-PAK  - Take by mouth daily. Prednisone dosing: Take  Prednisone 40mg  (4 tabs) x 3 days, then taper to 30mg  (3 tabs) x 3 days, then 20mg  (2 tabs) x 3days, then 10mg  (1 tab) x 3days, then OFF.  -   - Dispense:  30 tabs, refills: None     SINGULAIR 10 MG tablet  Generic drug:  montelukast  Take 10 mg by mouth every morning.     vitamin C 500 MG tablet  Commonly known as:  ASCORBIC ACID  Take 500 mg by mouth 2 (two) times daily.       No Known Allergies Follow-up Information   Follow up with [name], MD. Schedule an appointment as soon as possible for a visit in 1 week. [facility] followup)    Specialty:  Internal Medicine   Contact  information:   [address] [place] [address] [phone]       Schedule an appointment as soon as possible for a visit with HAWKINS,EDWARD L, MD. (As needed)    Specialty:  Pulmonary Disease   Contact information:   [place] [address] [phone]        The results of significant diagnostics from this hospitalization (including imaging, microbiology, ancillary and laboratory) are listed below for reference.    Significant Diagnostic Studies: Ct Abdomen Pelvis Wo Contrast  02/25/2014   CLINICAL DATA:  Altered mental status.  EXAM: CT ABDOMEN AND PELVIS WITHOUT CONTRAST  TECHNIQUE: Multidetector CT imaging of the abdomen and pelvis was performed following the standard protocol without IV contrast.  COMPARISON:  02/27/2012 CT.  FINDINGS: Bones: No aggressive osseous lesions. Lumbar degenerative disease and Schmorl's nodes. Bilateral hip osteoarthritis.  Lung Bases: Multiple chronic rounded opacities over the hemidiaphragm bilaterally. These appear similar to the prior exam from 2013 although there is motion artifact on today's examination. The origin of these nodules is unclear. Regardless, given the chronicity, these are almost certainly benign and probably related to asbestos related pleural disease.  Liver: Unenhanced CT was performed per clinician order. Lack of IV contrast limits sensitivity and specificity, especially for evaluation of abdominal/pelvic solid viscera. Grossly normal.  Spleen:  Normal.  Gallbladder:  Normal.  Common bile duct:  Normal.  Pancreas:  Normal.  Adrenal glands:  Normal bilaterally.  Kidneys: No renal calculi. No hydronephrosis. Both ureters are within normal limits.  Stomach:  Normal.  Small bowel:  Normal.  Colon:   Normal.  Normal appendix.  Pelvic Genitourinary: Foley catheter. Collapsed urinary bladder. No free fluid.  Vasculature: Atherosclerosis without an acute vascular abnormality.  Body Wall: Fat  containing left inguinal hernia. Probable right inguinal herniorrhaphy.  IMPRESSION: 1. No acute  abnormality. 2. 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   Electronically Signed   By: Dereck Ligas M.D.   On: 02/25/2014 00:15   Ct Head Wo Contrast  02/25/2014   CLINICAL DATA:  Altered mental status worsening over last 2 weeks.  EXAM: CT HEAD WITHOUT CONTRAST  TECHNIQUE: Contiguous axial images were obtained from the base of the skull through the vertex without intravenous contrast.  COMPARISON:  10/17/2012.  FINDINGS: No mass lesion, mass effect, midline shift, hydrocephalus, hemorrhage. No acute territorial cortical ischemia/infarct. Atrophy and chronic ischemic white matter disease is present. Small lacune is present in the right basal ganglia. Metal object present along the right lateral globe, unchanged from prior.  IMPRESSION: Atrophy and chronic ischemic white matter disease with old right basal ganglia lacunar infarct. No acute intracranial abnormality.   Electronically Signed   By: Dereck Ligas M.D.   On: 02/25/2014 00:09   Dg Chest Port 1 View  02/26/2014   CLINICAL DATA:  Respiratory failure.  EXAM: PORTABLE CHEST - 1 VIEW  COMPARISON:  Chest radiograph from 02/24/2014  FINDINGS: There is decreased expansion of the right lung, with right basilar airspace opacity, which may reflect atelectasis or pneumonia. Pulmonary vascularity is at the upper limits of normal. Scattered nodular plaques are again seen bilaterally, likely reflecting prior asbestos exposure. The left mid lung nodule may have increased in size, but is difficult to fully characterize on radiograph.  The cardiomediastinal silhouette is normal in size. No acute osseous abnormalities are seen.  IMPRESSION: 1. Decreased expansion of the right lung, with right basilar airspace opacity, which may reflect atelectasis or pneumonia. 2. Scattered nodular plaques  again noted bilaterally, likely reflecting prior asbestos exposure. The left mid lung nodule may have increased in size but is difficult to fully characterize on radiograph.   Electronically Signed   By: Garald Balding M.D.   On: 02/26/2014 03:38   Dg Chest Portable 1 View  02/24/2014   CLINICAL DATA:  Dementia.  Bradycardia  EXAM: PORTABLE CHEST - 1 VIEW  COMPARISON:  None.  FINDINGS: Cardiomegaly is present. Pulmonary vascular congestion. Interstitial pulmonary edema. Basilar atelectasis. Monitoring leads project over the chest. Tortuous thoracic aorta appears similar to prior exam. No pleural effusion is identified.  IMPRESSION: Constellation of findings compatible with mild CHF.   Electronically Signed   By: Dereck Ligas M.D.   On: 02/24/2014 21:12   Dg Swallowing Func-speech Pathology  02/26/2014   Ephraim Hamburger, CCC-SLP     02/26/2014  3:33 PM Objective Swallowing Evaluation: Modified Barium Swallowing Study   Patient Details  Name: SHIV SHUEY MRN: [id number] Date of Birth: 1930/11/04  Today's Date: 02/26/2014 Time: 4315-4008 SLP Time Calculation (min): 31 min  Past Medical History:  Past Medical History  Diagnosis Date  . Glaucoma   . Anxiety   . GERD (gastroesophageal reflux disease)   . Hyperlipidemia   . Hypertension   . Chronic abdominal pain   . 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 Chronic respiratory failure   . Altered mental status 06/12/2012; 08/14/2012    Unwitnessed isolated seizure suspected, but because of the EEG  being normal, Dr. Merlene Laughter did not recommend antiseizure  therapy.; "violent since 08/13/2012" (08/14/2012)  . Hard of hearing   . Seizure 06/14/2012    Isolated seizure suspected, but not confirmed.  . Bradycardia   . Complication of anesthesia 2005    "couldn't get him  woke up when he should have after eye  surgery" (08/14/2012)  . Mesothelioma   . Pleural plaque 04/2012  . Exertional dyspnea   . DM type 2 (diabetes mellitus, type 2) 06/13/2012  . UTI  (lower urinary tract infection)    Past Surgical History:  Past Surgical History  Procedure Laterality Date  . Inguinal hernia repair    . Prostate surgery    . Left eye surgery for glaucoma at [facility]  10/2009  . Cataract extraction w/ intraocular lens implant  2005    "left" (08/14/2012)   HPI:  [name] [name] is a 80 y.o. male with a past medical history  of dementia, hypertension, Keppra, diabetes, seizure disorder,  glaucoma, who lives in a skilled nursing facility. He was sent  over from there because of altered mental status. Patient is  currently on BiPAP and has advanced dementia and so is unable to  provide any history. According to the emergency department  records over the past 2-3 weeks ago. He has been sleeping more  than normal. Today he was unresponsive. He was complaining of  abdominal pain. He has history of hospitalization for UTI. He has  a history of COPD. In the emergency department evaluation  revealed hypercapnia. Patient was placed on BiPAP and his mental  status has improved. Though he still remains confused due to his  baseline dementia     Assessment / Plan / Recommendation Clinical Impression  Dysphagia Diagnosis: Mild oral phase dysphagia;Mild pharyngeal  phase dysphagia  Clinical impression: Mild oropharyngeal phase dysphagia  exacerbated by pt positioning (pt moaning with head arched back  and needed frequent cues for repositioning-c/o discomfort with  catheter) characterized by mild reduced lingual movement  resulting in prolonged oral transit/brief oral holding with  liquids and premature spillage/delay in swallow initiation to  valleculae consistent across textures/consistencies. Pt aspirated  to the cords a trace amount of thin before the swallow when  taking pill and thin (premature spillage), however spontaneously  expelled. Increased residuals noted in pyriforms and at CP  segment when taking straw sips of thin. Pt benefitted from cue to  repeat swallow after liquids to help  clear the trace/mild  residuals. Recommend D3/mech soft and thin liquids when pt is  alert and upright- SMALL sips from straw ok. Medication taken  whole in puree or crushed in puree as able. Do not give pills  whole with liquid as this increases his risk for aspiration.     Treatment Recommendation  Defer treatment plan to SLP at (Comment) ([facility])    Diet Recommendation Dysphagia 3 (Mechanical Soft);Thin liquid   Liquid Administration via: Cup;Straw (single sips only) Medication Administration: Whole meds with puree Supervision: Staff to assist with self feeding Compensations: Slow rate;Check for pocketing;Small  sips/bites;Multiple dry swallows after each bite/sip Postural Changes and/or Swallow Maneuvers: Seated upright 90  degrees;Upright 30-60 min after meal    Other  Recommendations Recommended Consults: MBS Oral Care Recommendations: Oral care BID Other Recommendations: Clarify dietary restrictions   Follow Up Recommendations  Skilled Nursing facility    Frequency and Duration        Pertinent Vitals/Pain Nasal cannula for O2    SLP Swallow Goals  Pt will likely be discharged to [facility] tomorrow. Recommend f/u  with SLP for diet tolerance and pt/caregiver education    General Date of Onset: 02/24/14 HPI: ANGELINA NEECE is a 80 y.o. male with a past medical  history of dementia, hypertension, Keppra, diabetes, seizure  disorder, glaucoma, who lives in a skilled nursing facility. He  was sent over from there because of altered mental status.  Patient is currently on BiPAP and has advanced dementia and so is  unable to provide any history. According to the emergency  department records over the past 2-3 weeks ago. He has been  sleeping more than normal. Today he was unresponsive. He was  complaining of abdominal pain. He has history of hospitalization  for UTI. He has a history of COPD. In the emergency department  evaluation revealed hypercapnia. Patient was placed on BiPAP and  his mental status has  improved. Though he still remains confused  due to his baseline dementia Type of Study: Modified Barium Swallowing Study Reason for Referral: Objectively evaluate swallowing function Previous Swallow Assessment: BSE 02/26/2014: D3/NTL Diet Prior to this Study: Dysphagia 3 (soft);Nectar-thick liquids Temperature Spikes Noted: No Respiratory Status: Nasal cannula History of Recent Intubation: No Behavior/Cognition: Alert;Cooperative;Pleasant mood;Hard of  hearing;Requires cueing Oral Cavity - Dentition: Edentulous Oral Motor / Sensory Function: Within functional limits Self-Feeding Abilities: Needs assist Patient Positioning: Upright in chair Baseline Vocal Quality: Clear Volitional Cough: Strong Volitional Swallow: Unable to elicit Anatomy: Within functional limits Pharyngeal Secretions: Not observed secondary MBS    Reason for Referral Objectively evaluate swallowing function   Oral Phase Oral Preparation/Oral Phase Oral Phase: WFL   Pharyngeal Phase Pharyngeal Phase Pharyngeal Phase: Impaired Pharyngeal - Nectar Pharyngeal - Nectar Straw: Delayed swallow initiation;Premature  spillage to valleculae;Lateral channel residue;Pharyngeal residue  - pyriform sinuses (mild residuals in pharynx) Pharyngeal - Thin Pharyngeal - Thin Cup: Premature spillage to valleculae;Delayed  swallow initiation;Pharyngeal residue - cp segment Pharyngeal - Thin Straw: Premature spillage to  valleculae;Premature spillage to pyriform sinuses;Reduced  laryngeal elevation;Reduced airway/laryngeal  closure;Penetration/Aspiration during swallow;Lateral channel  residue;Pharyngeal residue - cp segment (trace residuals in  lateral channels) Penetration/Aspiration details (thin straw): Material does not  enter airway;Material enters airway, remains ABOVE vocal cords  then ejected out Pharyngeal - Solids Pharyngeal - Puree: Within functional limits Pharyngeal - Mechanical Soft: Premature spillage to  valleculae;Within functional limits Pharyngeal -  Pill: Penetration/Aspiration before swallow (trace  aspiration to the cords of thin when taking pill) Penetration/Aspiration details (pill): Material enters airway,  CONTACTS cords then ejected out  Cervical Esophageal Phase      Thank you,  Genene Churn, CCC-SLP [phone]  Cervical Esophageal Phase Cervical Esophageal Phase: [facility]         PORTER,DABNEY 02/26/2014, 3:31 PM     Microbiology: Recent Results (from the past 240 hour(s))  URINE CULTURE     Status: None   Collection Time    02/24/14  9:38 PM      Result Value Ref Range Status   Specimen Description URINE, CATHETERIZED   Final   Special Requests NONE   Final   Culture  Setup Time     Final   Value: 02/25/2014 14:00     Performed at [facility] Count     Final   Value: NO GROWTH     Performed at [facility]   Culture     Final   Value: NO GROWTH     Performed at [facility]   Report Status 02/26/2014 FINAL   Final  MRSA PCR SCREENING     Status: None   Collection Time    02/25/14  2:27 AM      Result Value Ref Range Status   MRSA by PCR NEGATIVE  NEGATIVE  Final   Comment:            The GeneXpert MRSA Assay (FDA     approved for NASAL specimens     only), is one component of a     comprehensive MRSA colonization     surveillance program. It is not     intended to diagnose MRSA     infection nor to guide or     monitor treatment for     MRSA infections.     Labs: Basic Metabolic Panel:  Recent Labs Lab 02/24/14 2107 02/25/14 0308 02/26/14 0430 02/27/14 0525  NA 141 143 142 140  K 4.4 5.2 4.8 4.5  CL 93* 95* 92* 93*  CO2 44* 43* 38* 37*  GLUCOSE 117* 121* 129* 141*  BUN 15 14 20  24*  CREATININE 0.88 0.80 1.03 1.03  CALCIUM 9.9 10.0 10.5 10.2   Liver Function Tests:  Recent Labs Lab 02/24/14 2107 02/25/14 0308  AST 9 9  ALT 5 <5  ALKPHOS 66 71  BILITOT <0.2* 0.2*  PROT 7.4 7.7  ALBUMIN 3.1* 3.3*   No results found for this basename: LIPASE, AMYLASE,  in  the last 168 hours  Recent Labs Lab 02/24/14 2107  AMMONIA 47   CBC:  Recent Labs Lab 02/24/14 2107 02/25/14 0308 02/26/14 0430 02/27/14 0525  WBC 6.8 8.8 7.5 7.1  NEUTROABS 4.1  --   --   --   HGB 10.3* 10.5* 10.8* 10.4*  HCT 36.9* 37.4* 37.1* 34.8*  MCV 97.4 96.1 92.8 90.4  PLT 158 165 199 191   Cardiac Enzymes:  Recent Labs Lab 02/24/14 2107 02/25/14 0300  TROPONINI <0.30 <0.30   BNP: BNP (last 3 results)  Recent Labs  02/25/14 0300  PROBNP 64.0   CBG:  Recent Labs Lab 02/26/14 1958 02/26/14 2014 02/27/14 0008 02/27/14 0405 02/27/14 0745  GLUCAP 124* 122* 141* 130* 137*       Signed:  MIKHAIL, MARYANN  [facility] 02/27/2014, 8:38 AM

## 2014-03-03 IMAGING — CR DG CHEST 1V PORT
1 series · 1 of 1 positions shown · non-contrast
Comparison: 07/24/2012

CLINICAL DATA: Shortness of breath and altered mental status.
History of mesothelioma.

PORTABLE CHEST - 1 VIEW

[view not recorded]
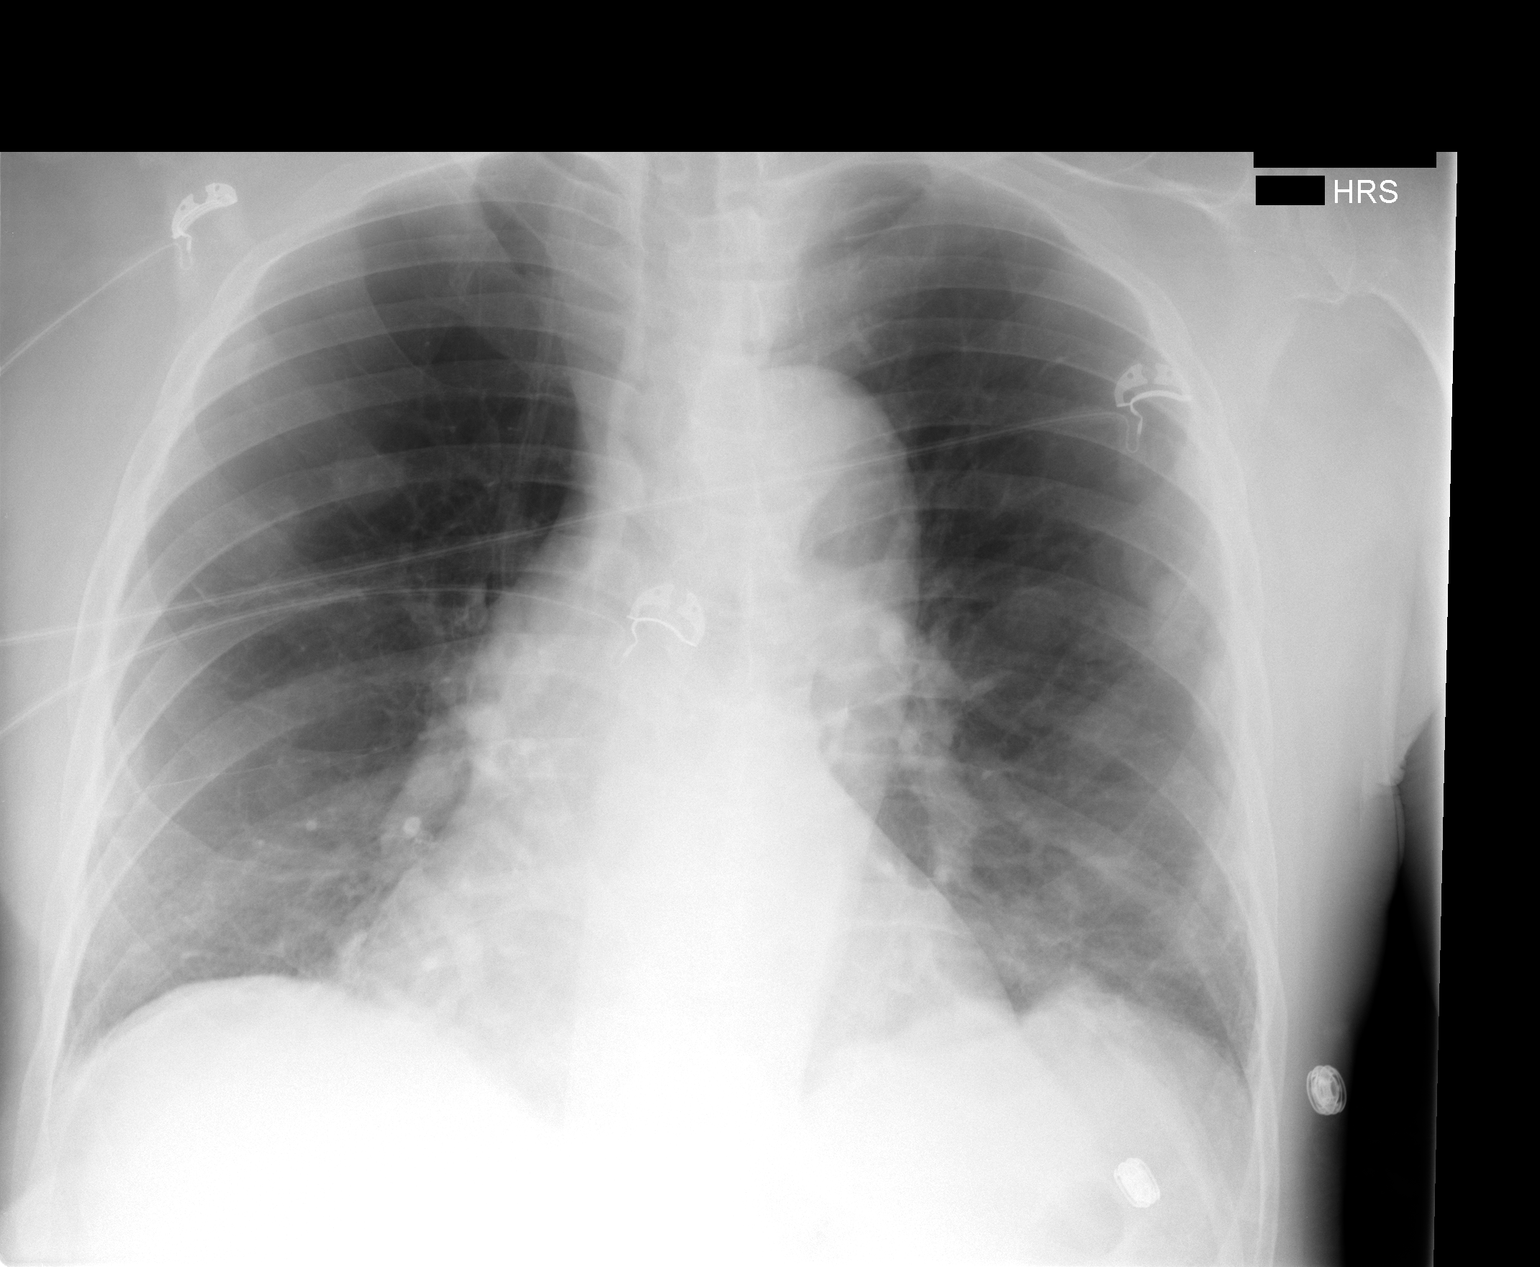

[1 of 1 positions shown; findings below may reference images not displayed]

FINDINGS: Chronic lung disease and pleural-based nodularity is
stable.  The heart size is stable.  There is stable tortuosity of
the thoracic aorta.  No infiltrates, edema or pleural fluid
identified.
IMPRESSION: No acute findings.  Stable chronic lung disease and pleural
nodules.

## 2014-03-23 ENCOUNTER — Encounter (HOSPITAL_COMMUNITY): Payer: Self-pay | Admitting: Emergency Medicine

## 2014-03-23 ENCOUNTER — Emergency Department (HOSPITAL_COMMUNITY): Payer: PRIVATE HEALTH INSURANCE

## 2014-03-23 ENCOUNTER — Inpatient Hospital Stay (HOSPITAL_COMMUNITY)
Admission: EM | Admit: 2014-03-23 | Discharge: 2014-03-30 | DRG: 190 | Disposition: A | Discharge status: Skilled Nursing Facility | Payer: PRIVATE HEALTH INSURANCE | Attending: Internal Medicine | Admitting: Internal Medicine

## 2014-03-23 DIAGNOSIS — T380X5A Adverse effect of glucocorticoids and synthetic analogues, initial encounter: Secondary | ICD-10-CM | POA: Diagnosis present

## 2014-03-23 DIAGNOSIS — H9190 Unspecified hearing loss, unspecified ear: Secondary | ICD-10-CM | POA: Diagnosis present

## 2014-03-23 DIAGNOSIS — J441 Chronic obstructive pulmonary disease with (acute) exacerbation: Principal | ICD-10-CM | POA: Diagnosis present

## 2014-03-23 DIAGNOSIS — Z87891 Personal history of nicotine dependence: Secondary | ICD-10-CM

## 2014-03-23 DIAGNOSIS — F411 Generalized anxiety disorder: Secondary | ICD-10-CM

## 2014-03-23 DIAGNOSIS — Z803 Family history of malignant neoplasm of breast: Secondary | ICD-10-CM

## 2014-03-23 DIAGNOSIS — J9620 Acute and chronic respiratory failure, unspecified whether with hypoxia or hypercapnia: Secondary | ICD-10-CM | POA: Diagnosis present

## 2014-03-23 DIAGNOSIS — E8729 Other acidosis: Secondary | ICD-10-CM

## 2014-03-23 DIAGNOSIS — E785 Hyperlipidemia, unspecified: Secondary | ICD-10-CM | POA: Diagnosis present

## 2014-03-23 DIAGNOSIS — J9622 Acute and chronic respiratory failure with hypercapnia: Secondary | ICD-10-CM

## 2014-03-23 DIAGNOSIS — E872 Acidosis: Secondary | ICD-10-CM

## 2014-03-23 DIAGNOSIS — I509 Heart failure, unspecified: Secondary | ICD-10-CM | POA: Diagnosis present

## 2014-03-23 DIAGNOSIS — D638 Anemia in other chronic diseases classified elsewhere: Secondary | ICD-10-CM | POA: Diagnosis present

## 2014-03-23 DIAGNOSIS — G8929 Other chronic pain: Secondary | ICD-10-CM | POA: Diagnosis present

## 2014-03-23 DIAGNOSIS — H543 Unqualified visual loss, both eyes: Secondary | ICD-10-CM | POA: Diagnosis present

## 2014-03-23 DIAGNOSIS — IMO0002 Reserved for concepts with insufficient information to code with codable children: Secondary | ICD-10-CM | POA: Diagnosis present

## 2014-03-23 DIAGNOSIS — K219 Gastro-esophageal reflux disease without esophagitis: Secondary | ICD-10-CM | POA: Diagnosis present

## 2014-03-23 DIAGNOSIS — Z79899 Other long term (current) drug therapy: Secondary | ICD-10-CM

## 2014-03-23 DIAGNOSIS — I10 Essential (primary) hypertension: Secondary | ICD-10-CM | POA: Diagnosis present

## 2014-03-23 DIAGNOSIS — E119 Type 2 diabetes mellitus without complications: Secondary | ICD-10-CM | POA: Diagnosis present

## 2014-03-23 DIAGNOSIS — F0390 Unspecified dementia without behavioral disturbance: Secondary | ICD-10-CM

## 2014-03-23 DIAGNOSIS — H409 Unspecified glaucoma: Secondary | ICD-10-CM | POA: Diagnosis present

## 2014-03-23 DIAGNOSIS — F0393 Unspecified dementia, unspecified severity, with mood disturbance: Secondary | ICD-10-CM | POA: Diagnosis present

## 2014-03-23 DIAGNOSIS — R5381 Other malaise: Secondary | ICD-10-CM | POA: Diagnosis present

## 2014-03-23 DIAGNOSIS — J9602 Acute respiratory failure with hypercapnia: Secondary | ICD-10-CM

## 2014-03-23 DIAGNOSIS — G9341 Metabolic encephalopathy: Secondary | ICD-10-CM | POA: Diagnosis present

## 2014-03-23 DIAGNOSIS — J189 Pneumonia, unspecified organism: Secondary | ICD-10-CM | POA: Diagnosis present

## 2014-03-23 DIAGNOSIS — R0989 Other specified symptoms and signs involving the circulatory and respiratory systems: Secondary | ICD-10-CM | POA: Diagnosis present

## 2014-03-23 DIAGNOSIS — Z9981 Dependence on supplemental oxygen: Secondary | ICD-10-CM | POA: Diagnosis not present

## 2014-03-23 DIAGNOSIS — J90 Pleural effusion, not elsewhere classified: Secondary | ICD-10-CM | POA: Diagnosis not present

## 2014-03-23 DIAGNOSIS — Z801 Family history of malignant neoplasm of trachea, bronchus and lung: Secondary | ICD-10-CM

## 2014-03-23 DIAGNOSIS — R0902 Hypoxemia: Secondary | ICD-10-CM

## 2014-03-23 DIAGNOSIS — Z66 Do not resuscitate: Secondary | ICD-10-CM | POA: Diagnosis present

## 2014-03-23 DIAGNOSIS — E875 Hyperkalemia: Secondary | ICD-10-CM | POA: Diagnosis present

## 2014-03-23 DIAGNOSIS — D649 Anemia, unspecified: Secondary | ICD-10-CM | POA: Diagnosis present

## 2014-03-23 DIAGNOSIS — J9610 Chronic respiratory failure, unspecified whether with hypoxia or hypercapnia: Secondary | ICD-10-CM | POA: Diagnosis present

## 2014-03-23 DIAGNOSIS — R0609 Other forms of dyspnea: Secondary | ICD-10-CM | POA: Diagnosis not present

## 2014-03-23 DIAGNOSIS — R0689 Other abnormalities of breathing: Secondary | ICD-10-CM

## 2014-03-23 LAB — BLOOD GAS, ARTERIAL
Acid-Base Excess: 12.5 mmol/L — ABNORMAL HIGH (ref 0.0–2.0)
Acid-Base Excess: 14 mmol/L — ABNORMAL HIGH (ref 0.0–2.0)
Bicarbonate: 39.9 mEq/L — ABNORMAL HIGH (ref 20.0–24.0)
Bicarbonate: 41.1 mEq/L — ABNORMAL HIGH (ref 20.0–24.0)
DELIVERY SYSTEMS: POSITIVE
DELIVERY SYSTEMS: POSITIVE
DRAWN BY: 33234
Drawn by: 25788
EXPIRATORY PAP: 6
Expiratory PAP: 6
FIO2: 60 %
FIO2: 0.5 %
INSPIRATORY PAP: 16
Inspiratory PAP: 12
MODE: POSITIVE
O2 SAT: 99.2 %
O2 Saturation: 97.8 %
PATIENT TEMPERATURE: 37
PH ART: 7.244 — AB (ref 7.350–7.450)
PH ART: 7.279 — AB (ref 7.350–7.450)
PO2 ART: 111 mmHg — AB (ref 80.0–100.0)
Patient temperature: 37
TCO2: 38.1 mmol/L (ref 0–100)
TCO2: 38.9 mmol/L (ref 0–100)
pCO2 arterial: 95.7 mmHg (ref 35.0–45.0)
pCO2 arterial: 90.5 mmHg (ref 35.0–45.0)
pO2, Arterial: 213 mmHg — ABNORMAL HIGH (ref 80.0–100.0)

## 2014-03-23 LAB — CBC
HEMATOCRIT: 37.3 % — AB (ref 39.0–52.0)
HEMOGLOBIN: 10.4 g/dL — AB (ref 13.0–17.0)
MCH: 27.6 pg (ref 26.0–34.0)
MCHC: 27.9 g/dL — ABNORMAL LOW (ref 30.0–36.0)
MCV: 98.9 fL (ref 78.0–100.0)
Platelets: 168 10*3/uL (ref 150–400)
RBC: 3.77 MIL/uL — ABNORMAL LOW (ref 4.22–5.81)
RDW: 16.4 % — ABNORMAL HIGH (ref 11.5–15.5)
WBC: 9.7 10*3/uL (ref 4.0–10.5)

## 2014-03-23 LAB — URINALYSIS, ROUTINE W REFLEX MICROSCOPIC
BILIRUBIN URINE: NEGATIVE
Glucose, UA: NEGATIVE mg/dL
Leukocytes, UA: NEGATIVE
NITRITE: NEGATIVE
PH: 5.5 (ref 5.0–8.0)
Protein, ur: NEGATIVE mg/dL
UROBILINOGEN UA: 0.2 mg/dL (ref 0.0–1.0)

## 2014-03-23 LAB — URINE MICROSCOPIC-ADD ON

## 2014-03-23 LAB — BASIC METABOLIC PANEL
Anion gap: 5 (ref 5–15)
BUN: 10 mg/dL (ref 6–23)
CALCIUM: 9.9 mg/dL (ref 8.4–10.5)
CO2: 43 meq/L — AB (ref 19–32)
CREATININE: 0.94 mg/dL (ref 0.50–1.35)
Chloride: 96 mEq/L (ref 96–112)
GFR calc Af Amer: 89 mL/min — ABNORMAL LOW (ref >90)
GFR calc non Af Amer: 77 mL/min — ABNORMAL LOW (ref >90)
GLUCOSE: 94 mg/dL (ref 70–99)
Potassium: 5.3 mEq/L (ref 3.7–5.3)
Sodium: 144 mEq/L (ref 137–147)

## 2014-03-23 LAB — GLUCOSE, CAPILLARY
GLUCOSE-CAPILLARY: 148 mg/dL — AB (ref 70–99)
Glucose-Capillary: 144 mg/dL — ABNORMAL HIGH (ref 70–99)

## 2014-03-23 LAB — PRO B NATRIURETIC PEPTIDE: Pro B Natriuretic peptide (BNP): 293.3 pg/mL (ref 0–450)

## 2014-03-23 LAB — TROPONIN I: Troponin I: <0.3 ng/mL (ref <0.30)

## 2014-03-23 MED ORDER — LORAZEPAM 1 MG PO TABS
1.0000 mg | ORAL_TABLET | Freq: Every day | ORAL | Status: DC
  Administered 2014-03-24 – 2014-03-29 (×6): 1 mg via ORAL
  Filled 2014-03-23 (×5): qty 1
  Filled 2014-03-23: qty 2

## 2014-03-23 MED ORDER — VITAMIN C 500 MG PO TABS
500.0000 mg | ORAL_TABLET | Freq: Two times a day (BID) | ORAL | Status: DC
  Administered 2014-03-24 – 2014-03-30 (×13): 500 mg via ORAL
  Filled 2014-03-23 (×13): qty 1

## 2014-03-23 MED ORDER — MONTELUKAST SODIUM 10 MG PO TABS
10.0000 mg | ORAL_TABLET | Freq: Every day | ORAL | Status: DC
  Administered 2014-03-24 – 2014-03-29 (×6): 10 mg via ORAL
  Filled 2014-03-23 (×6): qty 1

## 2014-03-23 MED ORDER — ALBUTEROL (5 MG/ML) CONTINUOUS INHALATION SOLN
15.0000 mg/h | INHALATION_SOLUTION | RESPIRATORY_TRACT | Status: DC
  Administered 2014-03-23: 15 mg/h via RESPIRATORY_TRACT
  Filled 2014-03-23: qty 20

## 2014-03-23 MED ORDER — DONEPEZIL HCL 5 MG PO TABS
10.0000 mg | ORAL_TABLET | Freq: Every day | ORAL | Status: DC
  Administered 2014-03-24 – 2014-03-29 (×6): 10 mg via ORAL
  Filled 2014-03-23 (×6): qty 2

## 2014-03-23 MED ORDER — INSULIN ASPART 100 UNIT/ML ~~LOC~~ SOLN
0.0000 [IU] | Freq: Three times a day (TID) | SUBCUTANEOUS | Status: DC
  Administered 2014-03-23 – 2014-03-24 (×3): 2 [IU] via SUBCUTANEOUS
  Administered 2014-03-24 – 2014-03-26 (×4): 3 [IU] via SUBCUTANEOUS
  Administered 2014-03-26 – 2014-03-27 (×3): 2 [IU] via SUBCUTANEOUS
  Administered 2014-03-27 – 2014-03-28 (×3): 3 [IU] via SUBCUTANEOUS
  Administered 2014-03-28: 2 [IU] via SUBCUTANEOUS
  Administered 2014-03-28: 5 [IU] via SUBCUTANEOUS
  Administered 2014-03-29: 3 [IU] via SUBCUTANEOUS
  Administered 2014-03-29: 2 [IU] via SUBCUTANEOUS
  Administered 2014-03-29: 5 [IU] via SUBCUTANEOUS
  Administered 2014-03-30: 2 [IU] via SUBCUTANEOUS

## 2014-03-23 MED ORDER — LORAZEPAM 0.5 MG PO TABS
0.5000 mg | ORAL_TABLET | Freq: Two times a day (BID) | ORAL | Status: DC
  Administered 2014-03-25 – 2014-03-30 (×9): 0.5 mg via ORAL
  Filled 2014-03-23 (×11): qty 1

## 2014-03-23 MED ORDER — BIOTENE DRY MOUTH MT LIQD
15.0000 mL | Freq: Two times a day (BID) | OROMUCOSAL | Status: DC
  Administered 2014-03-24 – 2014-03-30 (×9): 15 mL via OROMUCOSAL

## 2014-03-23 MED ORDER — METFORMIN HCL 500 MG PO TABS
500.0000 mg | ORAL_TABLET | Freq: Two times a day (BID) | ORAL | Status: DC
  Administered 2014-03-24 – 2014-03-30 (×11): 500 mg via ORAL
  Filled 2014-03-23 (×11): qty 1

## 2014-03-23 MED ORDER — SENNOSIDES-DOCUSATE SODIUM 8.6-50 MG PO TABS
1.0000 | ORAL_TABLET | Freq: Two times a day (BID) | ORAL | Status: DC
  Administered 2014-03-24 – 2014-03-30 (×13): 1 via ORAL
  Filled 2014-03-23 (×13): qty 1

## 2014-03-23 MED ORDER — IPRATROPIUM-ALBUTEROL 0.5-2.5 (3) MG/3ML IN SOLN: 3.0000 mL | RESPIRATORY_TRACT | Status: DC

## 2014-03-23 MED ORDER — DEXTROSE 5 % IV SOLN
1.0000 g | INTRAVENOUS | Status: DC
  Administered 2014-03-23 – 2014-03-29 (×7): 1 g via INTRAVENOUS
  Filled 2014-03-23 (×8): qty 10

## 2014-03-23 MED ORDER — ALBUTEROL SULFATE (2.5 MG/3ML) 0.083% IN NEBU
2.5000 mg | INHALATION_SOLUTION | Freq: Four times a day (QID) | RESPIRATORY_TRACT | Status: DC
  Administered 2014-03-23 – 2014-03-26 (×10): 2.5 mg via RESPIRATORY_TRACT
  Filled 2014-03-23 (×10): qty 3

## 2014-03-23 MED ORDER — METHYLPREDNISOLONE SODIUM SUCC 125 MG IJ SOLR
125.0000 mg | Freq: Once | INTRAMUSCULAR | Status: DC
  Filled 2014-03-23: qty 2

## 2014-03-23 MED ORDER — SODIUM CHLORIDE 0.9 % IV SOLN
INTRAVENOUS | Status: DC
  Administered 2014-03-23 – 2014-03-24 (×2): via INTRAVENOUS

## 2014-03-23 MED ORDER — POLYETHYLENE GLYCOL 3350 17 G PO PACK
17.0000 g | PACK | Freq: Every day | ORAL | Status: DC
  Administered 2014-03-24 – 2014-03-30 (×7): 17 g via ORAL
  Filled 2014-03-23 (×7): qty 1

## 2014-03-23 MED ORDER — BRIMONIDINE TARTRATE 0.15 % OP SOLN
1.0000 [drp] | Freq: Three times a day (TID) | OPHTHALMIC | Status: DC
  Administered 2014-03-23 – 2014-03-30 (×19): 1 [drp] via OPHTHALMIC
  Filled 2014-03-23: qty 5

## 2014-03-23 MED ORDER — PANTOPRAZOLE SODIUM 40 MG PO TBEC
40.0000 mg | DELAYED_RELEASE_TABLET | Freq: Every day | ORAL | Status: DC
  Administered 2014-03-24 – 2014-03-30 (×7): 40 mg via ORAL
  Filled 2014-03-23 (×7): qty 1

## 2014-03-23 MED ORDER — BIOTENE DRY MOUTH MT LIQD: 15.0000 mL | Freq: Two times a day (BID) | OROMUCOSAL | Status: DC

## 2014-03-23 MED ORDER — DIVALPROEX SODIUM 125 MG PO CPSP
500.0000 mg | ORAL_CAPSULE | Freq: Three times a day (TID) | ORAL | Status: DC
  Administered 2014-03-24 – 2014-03-30 (×18): 500 mg via ORAL
  Filled 2014-03-23 (×23): qty 4

## 2014-03-23 MED ORDER — CHLORHEXIDINE GLUCONATE 0.12 % MT SOLN
15.0000 mL | Freq: Two times a day (BID) | OROMUCOSAL | Status: DC
  Administered 2014-03-23 – 2014-03-30 (×9): 15 mL via OROMUCOSAL
  Filled 2014-03-23 (×11): qty 15

## 2014-03-23 MED ORDER — METHYLPREDNISOLONE SODIUM SUCC 125 MG IJ SOLR
125.0000 mg | Freq: Four times a day (QID) | INTRAMUSCULAR | Status: DC
  Administered 2014-03-23 – 2014-03-25 (×7): 125 mg via INTRAVENOUS
  Filled 2014-03-23 (×7): qty 2

## 2014-03-23 MED ORDER — LATANOPROST 0.005 % OP SOLN
1.0000 [drp] | Freq: Every day | OPHTHALMIC | Status: DC
  Administered 2014-03-23 – 2014-03-29 (×7): 1 [drp] via OPHTHALMIC
  Filled 2014-03-23: qty 2.5

## 2014-03-23 MED ORDER — AMLODIPINE BESYLATE 5 MG PO TABS
5.0000 mg | ORAL_TABLET | Freq: Every day | ORAL | Status: DC
  Administered 2014-03-24 – 2014-03-30 (×7): 5 mg via ORAL
  Filled 2014-03-23 (×7): qty 1

## 2014-03-23 MED ORDER — HEPARIN SODIUM (PORCINE) 5000 UNIT/ML IJ SOLN
5000.0000 [IU] | Freq: Three times a day (TID) | INTRAMUSCULAR | Status: DC
  Administered 2014-03-23 – 2014-03-30 (×20): 5000 [IU] via SUBCUTANEOUS
  Filled 2014-03-23 (×19): qty 1

## 2014-03-23 MED ORDER — INSULIN ASPART 100 UNIT/ML ~~LOC~~ SOLN
0.0000 [IU] | Freq: Every day | SUBCUTANEOUS | Status: DC
  Administered 2014-03-24: 2 [IU] via SUBCUTANEOUS
  Administered 2014-03-29: 5 [IU] via SUBCUTANEOUS

## 2014-03-23 MED ORDER — CITALOPRAM HYDROBROMIDE 20 MG PO TABS
10.0000 mg | ORAL_TABLET | Freq: Every day | ORAL | Status: DC
  Administered 2014-03-24 – 2014-03-30 (×7): 10 mg via ORAL
  Filled 2014-03-23 (×7): qty 1

## 2014-03-23 MED ORDER — ONDANSETRON HCL 4 MG PO TABS: 4.0000 mg | ORAL_TABLET | Freq: Four times a day (QID) | ORAL | Status: DC | PRN

## 2014-03-23 MED ORDER — LORATADINE 10 MG PO TABS
10.0000 mg | ORAL_TABLET | Freq: Every day | ORAL | Status: DC
  Administered 2014-03-24 – 2014-03-30 (×7): 10 mg via ORAL
  Filled 2014-03-23 (×7): qty 1

## 2014-03-23 MED ORDER — MONTELUKAST SODIUM 10 MG PO TABS: 10.0000 mg | ORAL_TABLET | Freq: Every morning | ORAL | Status: DC

## 2014-03-23 MED ORDER — DORZOLAMIDE HCL-TIMOLOL MAL 2-0.5 % OP SOLN
1.0000 [drp] | Freq: Two times a day (BID) | OPHTHALMIC | Status: DC
  Administered 2014-03-23 – 2014-03-30 (×14): 1 [drp] via OPHTHALMIC
  Filled 2014-03-23: qty 10

## 2014-03-23 MED ORDER — LORAZEPAM 0.5 MG PO TABS: 0.5000 mg | ORAL_TABLET | Freq: Two times a day (BID) | ORAL | Status: DC

## 2014-03-23 MED ORDER — LEVETIRACETAM 250 MG PO TABS
250.0000 mg | ORAL_TABLET | Freq: Two times a day (BID) | ORAL | Status: DC
  Administered 2014-03-24 – 2014-03-30 (×13): 250 mg via ORAL
  Filled 2014-03-23 (×13): qty 1

## 2014-03-23 MED ORDER — IPRATROPIUM-ALBUTEROL 0.5-2.5 (3) MG/3ML IN SOLN: 3.0000 mL | Freq: Once | RESPIRATORY_TRACT | Status: DC

## 2014-03-23 MED ORDER — FERROUS SULFATE 325 (65 FE) MG PO TABS
325.0000 mg | ORAL_TABLET | Freq: Two times a day (BID) | ORAL | Status: DC
  Administered 2014-03-24 – 2014-03-30 (×13): 325 mg via ORAL
  Filled 2014-03-23 (×13): qty 1

## 2014-03-23 MED ORDER — ONDANSETRON HCL 4 MG/2ML IJ SOLN: 4.0000 mg | Freq: Four times a day (QID) | INTRAMUSCULAR | Status: DC | PRN

## 2014-03-23 MED ORDER — PILOCARPINE HCL 1 % OP SOLN
1.0000 [drp] | Freq: Four times a day (QID) | OPHTHALMIC | Status: DC
  Administered 2014-03-23 – 2014-03-30 (×27): 1 [drp] via OPHTHALMIC
  Filled 2014-03-23: qty 15

## 2014-03-23 MED ORDER — SIMVASTATIN 20 MG PO TABS
20.0000 mg | ORAL_TABLET | Freq: Every day | ORAL | Status: DC
  Administered 2014-03-24 – 2014-03-29 (×6): 20 mg via ORAL
  Filled 2014-03-23 (×6): qty 1

## 2014-03-23 NOTE — ED Notes (Signed)
CRITICAL VALUE ALERT  Critical value received:  PH 7.27, PCO2 90.5, PO2 111, Bicarb 41.1  Date of notification:  03/23/14  Time of notification:  1423  Critical value read back:Yes.    Nurse who received alert:  Felicity Coyer, RN  MD notified (1st page):    Time of first page:    MD notified (2nd page):  Time of second page:  Responding MD:  Dr. Leonides Schanz  Time MD responded:  [phone]

## 2014-03-23 NOTE — ED Notes (Signed)
Pt's sats decreased to 87-88% on 30%FIO2, edp and RT notified and feels comfortable with levels.

## 2014-03-23 NOTE — ED Notes (Signed)
CRITICAL VALUE ALERT  Critical value received:  BLOOD GAS   Date of notification:  03/23/14  Time of notification:  1305  Critical value read back:Yes.    Nurse who received alert: Verl Whitmore, RN  MD notified (1st page):  WARD, RN

## 2014-03-23 NOTE — ED Notes (Signed)
Pt reports to ED via EMS from [facility]. Staff reports decreased LOC and was sating 83% on bipap at [facility]. Pt normally alert x2. Today pt has been "very lethargic" and not wanting to wear his bipap for last 2 nights. Staff reports that O2 sats have been going up and down; normally pt sats in the 90's. Pt being pu ton bipap now by respiratory therapy.

## 2014-03-23 NOTE — Progress Notes (Signed)
Patient transported from ED to ICU on BIPAP with RN.  No complications.

## 2014-03-23 NOTE — Progress Notes (Signed)
Patient came in with EMS from [facility] on a non-rebreather.  Per report from EMS and nursing coordinator, patient using Bipap at night at baseline and his settings are 12/6.  On Bipap at nursing home patients sats were only reading 86% per EMS.  Patient was transitioned to a Non-Rebreather.  Upon arrival, patient was lethargic and RT placed patient on our Bipap machine.  Trialed initial setting of 12/6 and 45%.  Patient was not getting good tidal volumes on these settings.  Increased IPAP to 16 and left EPAP at 6, VT are now 200-350. MD aware.  RT will continue to monitor patient.

## 2014-03-23 NOTE — ED Notes (Signed)
Lab critical CO2 called in and reported to Dr Leonides Schanz

## 2014-03-23 NOTE — ED Notes (Signed)
Per EMS - pt received Solumedrol 125mg  IM prior to transfer to [facility]. edp notified.

## 2014-03-23 NOTE — ED Provider Notes (Signed)
TIME SEEN: 1:05 PM  CHIEF COMPLAINT: Respiratory distress, hypoxia, decreased responsiveness  HPI: Patient is an 80 year old male who comes from [facility] facility with history of COPD on chronic 3 L of oxygen, CHF, hypertension, hyperlipidemia, diabetes, seizures on Keppra, dementia who presents emergency department with hypoxia, decreased responsiveness that started this morning. They attempted BiPAP at care nursing facility with no improvement. Family asked that patient be transported to the emergency department. Nursing staff from [facility] at bedside reports patient is a DO NOT RESUSCITATE. She denies the patient has had any fever, recent fall or injury that she is aware of, vomiting or diarrhea. Patient is able to tell us that he does not have any pain.  ROS: Level V caveat for dementia  PAST MEDICAL HISTORY/PAST SURGICAL HISTORY:  Past Medical History  Diagnosis Date  . Glaucoma   . Anxiety   . GERD (gastroesophageal reflux disease)   . Hyperlipidemia   . Hypertension   . Chronic abdominal pain   . 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 Chronic respiratory failure   . Altered mental status 06/12/2012; 08/14/2012    Unwitnessed isolated seizure suspected, but because of the EEG being normal, Dr. Merlene Laughter did not recommend antiseizure therapy.; "violent since 08/13/2012" (08/14/2012)  . Hard of hearing   . Seizure 06/14/2012    Isolated seizure suspected, but not confirmed.  . Bradycardia   . Complication of anesthesia 2005    "couldn't get him woke up when he should have after eye surgery" (08/14/2012)  . Mesothelioma   . Pleural plaque 04/2012  . Exertional dyspnea   . DM type 2 (diabetes mellitus, type 2) 06/13/2012  . UTI (lower urinary tract infection)     MEDICATIONS:  Prior to Admission medications   Medication Sig Start Date End Date Taking? Authorizing Provider  acetaminophen (TYLENOL) 650 MG CR tablet Take 650 mg by mouth 2 (two) times daily.     Historical Provider, MD  albuterol (PROVENTIL) (2.5 MG/3ML) 0.083% nebulizer solution Take 2.5 mg by nebulization 4 (four) times daily. 08/26/12   Modena Jansky, MD  amLODipine (NORVASC) 5 MG tablet Take 1 tablet (5 mg total) by mouth daily. 08/26/12   Modena Jansky, MD  amoxicillin-clavulanate (AUGMENTIN) 875-125 MG per tablet Take 1 tablet by mouth 2 (two) times daily. 02/27/14   Maryann Mikhail, DO  antiseptic oral rinse (BIOTENE) LIQD 15 mLs by Mouth Rinse route 2 times daily at 12 noon and 4 pm. 02/27/14   Maryann Mikhail, DO  brimonidine (ALPHAGAN P) 0.1 % SOLN Place 1 drop into both eyes 2 (two) times daily.     Historical Provider, MD  citalopram (CELEXA) 10 MG tablet Take 10 mg by mouth daily.    Historical Provider, MD  divalproex (DEPAKOTE SPRINKLE) 125 MG capsule Take 500 mg by mouth 3 (three) times daily.    Historical Provider, MD  donepezil (ARICEPT) 10 MG tablet Take 10 mg by mouth at bedtime.    Historical Provider, MD  Dorzolamide HCl-Timolol Mal PF 22.3-6.8 MG/ML SOLN Place 1 drop into the right eye 2 (two) times daily.     Historical Provider, MD  ferrous sulfate 325 (65 FE) MG EC tablet Take 325 mg by mouth 2 (two) times daily.    Historical Provider, MD  latanoprost (XALATAN) 0.005 % ophthalmic solution Place 1 drop into the right eye at bedtime.     Historical Provider, MD  levETIRAcetam (KEPPRA) 250 MG tablet Take 250 mg by mouth  2 (two) times daily.    Historical Provider, MD  loratadine (CLARITIN) 10 MG tablet Take 10 mg by mouth daily.    Historical Provider, MD  LORazepam (ATIVAN) 0.5 MG tablet Take 1 tablet (0.5 mg total) by mouth 2 (two) times daily. 02/27/14   Maryann Mikhail, DO  lovastatin (MEVACOR) 40 MG tablet Take 40 mg by mouth at bedtime.      Historical Provider, MD  metFORMIN (GLUCOPHAGE) 500 MG tablet Take 500 mg by mouth 2 (two) times daily with a meal.    Historical Provider, MD  montelukast (SINGULAIR) 10 MG tablet Take 10 mg by mouth every morning.      Historical Provider, MD  omeprazole (PRILOSEC) 20 MG capsule Take 20 mg by mouth daily.    Historical Provider, MD  pilocarpine (PILOCAR) 1 % ophthalmic solution Place 1 drop into the right eye 4 (four) times daily.  07/14/12   Historical Provider, MD  polyethylene glycol (MIRALAX / GLYCOLAX) packet Take 17 g by mouth daily. 08/26/12   Modena Jansky, MD  predniSONE (STERAPRED UNI-PAK) 10 MG tablet Take by mouth daily. Prednisone dosing: Take  Prednisone 40mg  (4 tabs) x 3 days, then taper to 30mg  (3 tabs) x 3 days, then 20mg  (2 tabs) x 3days, then 10mg  (1 tab) x 3days, then OFF.  Dispense:  30 tabs, refills: None 02/27/14   Maryann Mikhail, DO  senna-docusate (DOC-Q-LAX) 8.6-50 MG per tablet Take 1 tablet by mouth 2 (two) times daily.    Historical Provider, MD  vitamin C (ASCORBIC ACID) 500 MG tablet Take 500 mg by mouth 2 (two) times daily.    Historical Provider, MD    ALLERGIES:  No Known Allergies  SOCIAL HISTORY:  History  Substance Use Topics  . Smoking status: Former Smoker -- 2.00 packs/day for 40 years    Types: Cigarettes  . Smokeless tobacco: Never Used     Comment: "quit smoking cigarettes ~ 2001"  . Alcohol Use: Yes     Comment: 08/14/2012 "used to drink heavily; stopped ~ 2001"    FAMILY HISTORY: Family History  Problem Relation Age of Onset  . Lung cancer Sister   . Breast cancer Sister     EXAM: BP 131/86  Pulse 83  Temp(Src) 98.5 F (36.9 C) (Rectal)  Resp 26  SpO2 100% CONSTITUTIONAL: Alert and will respond to questions intermittently, does not open eyes to voice or painful stimuli, moves all his extremities normally HEAD: Normocephalic EYES: Conjunctivae clear, PERRL ENT: normal nose; no rhinorrhea; moist mucous membranes; pharynx without lesions noted NECK: Supple, no meningismus, no LAD  CARD: RRR; S1 and S2 appreciated; no murmurs, no clicks, no rubs, no gallops RESP: Normal chest excursion without splinting, patient is tachypnea, severely diminished  breath sounds bilaterally, no rhonchi or wheezing or rales appreciated, mild to moderate respiratory distress ABD/GI: Normal bowel sounds; non-distended; soft, non-tender, no rebound, no guarding BACK:  The back appears normal and is non-tender to palpation, there is no CVA tenderness EXT: Normal ROM in all joints; non-tender to palpation; no edema; normal capillary refill; no cyanosis    SKIN: Normal color for age and race; warm NEURO: Moves all extremities equally, no slurred speech or facial droop, follows commands intermittently, will not open his eyes but will answer questions, patient is very hard of hearing PSYCH: The patient's mood and manner are appropriate. Grooming and personal hygiene are appropriate.  MEDICAL DECISION MAKING: Patient here with respiratory distress. His ABG shows respiratory acidosis, PCO2 95.  Likely secondary to COPD exacerbation. He received Solu-Medrol with EMS. We'll give continuous albuterol and keep patient on BiPAP. Labs pending. His PCP is Dr. [name].  ED PROGRESS: 2:15 PM  Pt still stable. Discussed with patient's daughters were at the bedside. We will discuss if they would want intubation if needed. We'll repeat ABG patient has been on BiPAP for over one hour. Family reports they feel his COPD was exacerbated because he was not wearing his BiPAP for the last several nights like he normally does at his nursing facility.  3:01 PM  Pt is still resting comfortably. His tachypnea has improved. He is no longer hypoxic. FiO2 is 50%.  His blood gas shows only very mild improvement but he is still able to answer questions appropriately and appears comfortable. I do not feel clinically patient needs intubation at this time. Family is not at bedside again to discuss whether or not they would want intubation. We'll discuss with hospitalist for admission to step down. Family has confirmed the patient is a DO NOT RESUSCITATE.   3:18 PM  Spoke with Dr. Anastasio Champion with  hospitalist service who agrees for admission to step down, and patient bed.     CRITICAL CARE Performed by: Nyra Jabs   Total critical care time: 45 minutes  Critical care time was exclusive of separately billable procedures and treating other patients.  Critical care was necessary to treat or prevent imminent or life-threatening deterioration.  Critical care was time spent personally by me on the following activities: development of treatment plan with patient and/or surrogate as well as nursing, discussions with consultants, evaluation of patient's response to treatment, examination of patient, obtaining history from patient or surrogate, ordering and performing treatments and interventions, ordering and review of laboratory studies, ordering and review of radiographic studies, pulse oximetry and re-evaluation of patient's condition.    EKG Interpretation  Date/Time:  Tuesday March 23 2014 12:53:04 EDT Ventricular Rate:  86 PR Interval:  185 QRS Duration: 67 QT Interval:  340 QTC Calculation: 407 R Axis:   61 Text Interpretation:  Sinus rhythm Anteroseptal infarct, old Nonspecific T abnormalities, lateral leads Baseline wander in lead(s) I III aVL V3 Confirmed by WARD,  DO, KRISTEN ([id number]) on 03/23/2014 1:38:00 PM         [place], DO 03/23/14 1518

## 2014-03-23 NOTE — ED Notes (Signed)
CBG 156 according to EMS.

## 2014-03-23 NOTE — H&P (Signed)
[facility] History and Physical  PRINTICE HELLMER OIN:[id number] DOB: 01-23-1931 DOA: 03/23/2014  Referring physician: Dr. Leonides Schanz, ER. PCP: [name], MD   Chief Complaint: Unresponsiveness, respiratory failure.  HPI: [name] [name] is a 80 y.o. male  This is an 80 year old man, who is well-known to the hospital, who presents from the skilled nursing facility with unresponsiveness and respiratory failure. On arrival to the emergency room, he was clearly in respiratory failure and arterial blood gas showed a PCO2 of 95.7. His baseline appears to be around 60. He does have a history of dementia which is moderate to severe, history of chronic respiratory failure. He has been placed on BiPAP and appears to be fairly stable. His repeat blood gas has improved with PCO2 improving to around 90 now. Apparently he has not received BiPAP the skilled nursing facility for the last 3 days. He is supposed to receive this on daily basis. He is now being admitted for further management.   Review of Systems:  He is unable to give me a review of systems as he is on the BiPAP and somewhat unresponsive.    Past Medical History  Diagnosis Date  . Glaucoma   . Anxiety   . GERD (gastroesophageal reflux disease)   . Hyperlipidemia   . Hypertension   . Chronic abdominal pain   . 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 Chronic respiratory failure   . Altered mental status 06/12/2012; 08/14/2012    Unwitnessed isolated seizure suspected, but because of the EEG being normal, Dr. Merlene Laughter did not recommend antiseizure therapy.; "violent since 08/13/2012" (08/14/2012)  . Hard of hearing   . Seizure 06/14/2012    Isolated seizure suspected, but not confirmed.  . Bradycardia   . Complication of anesthesia 2005    "couldn't get him woke up when he should have after eye surgery" (08/14/2012)  . Mesothelioma   . Pleural plaque 04/2012  . Exertional dyspnea   . DM type 2 (diabetes mellitus,  type 2) 06/13/2012  . UTI (lower urinary tract infection)    Past Surgical History  Procedure Laterality Date  . Inguinal hernia repair    . Prostate surgery    . Left eye surgery for glaucoma at [facility]  10/2009  . Cataract extraction w/ intraocular lens implant  2005    "left" (08/14/2012)   Social History:  reports that he has quit smoking. His smoking use included Cigarettes. He has a 80 pack-year smoking history. He has never used smokeless tobacco. He reports that he drinks alcohol. He reports that he does not use illicit drugs.  No Known Allergies  Family History  Problem Relation Age of Onset  . Lung cancer Sister   . Breast cancer Sister      Prior to Admission medications   Medication Sig Start Date End Date Taking? Authorizing Provider  acetaminophen (TYLENOL) 650 MG CR tablet Take 650 mg by mouth 2 (two) times daily.   Yes Historical Provider, MD  albuterol (PROVENTIL) (2.5 MG/3ML) 0.083% nebulizer solution Take 2.5 mg by nebulization 4 (four) times daily. 08/26/12  Yes Modena Jansky, MD  amLODipine (NORVASC) 5 MG tablet Take 1 tablet (5 mg total) by mouth daily. 08/26/12  Yes Modena Jansky, MD  antiseptic oral rinse (BIOTENE) LIQD 15 mLs by Mouth Rinse route 2 times daily at 12 noon and 4 pm. 02/27/14  Yes Maryann Mikhail, DO  brimonidine (ALPHAGAN P) 0.1 % SOLN Place 1 drop into both eyes  2 (two) times daily.    Yes Historical Provider, MD  carbamide peroxide (DEBROX) 6.5 % otic solution Place 5 drops into both ears 2 (two) times daily. For 4 days, started on 03/19/2014   Yes Historical Provider, MD  citalopram (CELEXA) 10 MG tablet Take 10 mg by mouth daily.   Yes Historical Provider, MD  divalproex (DEPAKOTE SPRINKLE) 125 MG capsule Take 500 mg by mouth 3 (three) times daily.   Yes Historical Provider, MD  donepezil (ARICEPT) 10 MG tablet Take 10 mg by mouth at bedtime.   Yes Historical Provider, MD  Dorzolamide HCl-Timolol Mal PF 22.3-6.8 MG/ML SOLN Place 1 drop into  the right eye 2 (two) times daily.    Yes Historical Provider, MD  ferrous sulfate 325 (65 FE) MG EC tablet Take 325 mg by mouth 2 (two) times daily.   Yes Historical Provider, MD  latanoprost (XALATAN) 0.005 % ophthalmic solution Place 1 drop into the right eye at bedtime.    Yes Historical Provider, MD  levETIRAcetam (KEPPRA) 250 MG tablet Take 250 mg by mouth 2 (two) times daily.   Yes Historical Provider, MD  loratadine (CLARITIN) 10 MG tablet Take 10 mg by mouth daily.   Yes Historical Provider, MD  LORazepam (ATIVAN) 0.5 MG tablet Take 1 tablet (0.5 mg total) by mouth 2 (two) times daily. 02/27/14  Yes Maryann Mikhail, DO  LORazepam (ATIVAN) 1 MG tablet Take 1 mg by mouth at bedtime.   Yes Historical Provider, MD  lovastatin (MEVACOR) 40 MG tablet Take 40 mg by mouth at bedtime.     Yes Historical Provider, MD  metFORMIN (GLUCOPHAGE) 500 MG tablet Take 500 mg by mouth 2 (two) times daily with a meal.   Yes Historical Provider, MD  montelukast (SINGULAIR) 10 MG tablet Take 10 mg by mouth every morning.    Yes Historical Provider, MD  montelukast (SINGULAIR) 10 MG tablet Take 10 mg by mouth at bedtime.   Yes Historical Provider, MD  omeprazole (PRILOSEC) 20 MG capsule Take 20 mg by mouth daily.   Yes Historical Provider, MD  pilocarpine (PILOCAR) 1 % ophthalmic solution Place 1 drop into the right eye 4 (four) times daily.  07/14/12  Yes Historical Provider, MD  polyethylene glycol (MIRALAX / GLYCOLAX) packet Take 17 g by mouth daily. 08/26/12  Yes Modena Jansky, MD  senna-docusate (DOC-Q-LAX) 8.6-50 MG per tablet Take 1 tablet by mouth 2 (two) times daily.   Yes Historical Provider, MD  vitamin C (ASCORBIC ACID) 500 MG tablet Take 500 mg by mouth 2 (two) times daily.   Yes Historical Provider, MD  SOLU-MEDROL 125 MG injection Inject 125 mg into the muscle once. Cough/congestion 03/23/14   Historical Provider, MD   Physical Exam: Filed Vitals:   03/23/14 1500  BP: 128/68  Pulse: 77  Temp:    Resp: 17    BP 128/68  Pulse 77  Temp(Src) 98.5 F (36.9 C) (Rectal)  Resp 17  SpO2 100%  General:  Appears calm and comfortable on BiPAP. He does not look toxic or septic clinically. He does not have a resting tachycardia. His peripheries are warm and well perfused. Eyes: PERRL, normal lids, irises & conjunctiva ENT: grossly normal hearing, lips & tongue Neck: no LAD, masses or thyromegaly Cardiovascular: RRR, no m/r/g. No LE edema. Telemetry: SR, no arrhythmias  Respiratory: Scattered bilateral wheezing. No crackles, or bronchial breathing. Abdomen: soft, ntnd Skin: no rash or induration seen on limited exam Musculoskeletal: grossly normal tone BUE/BLE  Psychiatric: Unable to exam. Neurologic: grossly non-focal.          Labs on Admission:  Basic Metabolic Panel:  Recent Labs Lab 03/23/14 1327  NA 144  K 5.3  CL 96  CO2 43*  GLUCOSE 94  BUN 10  CREATININE 0.94  CALCIUM 9.9   Liver Function Tests:    CBC:  Recent Labs Lab 03/23/14 1327  WBC 9.7  HGB 10.4*  HCT 37.3*  MCV 98.9  PLT 168   Cardiac Enzymes:  Recent Labs Lab 03/23/14 1327  TROPONINI <0.30    BNP (last 3 results)  Recent Labs  02/25/14 0300 03/23/14 1327  PROBNP 64.0 293.3   CBG: No results found for this basename: GLUCAP,  in the last 168 hours  Radiological Exams on Admission: Dg Chest Portable 1 View  03/23/2014   CLINICAL DATA:  Respiratory distress and COPD.  EXAM: PORTABLE CHEST - 1 VIEW  COMPARISON:  02/26/2014  FINDINGS: Stable underlying severe emphysematous lung disease. Mild bibasilar scarring/ atelectasis present. There is no evidence of pulmonary edema, consolidation, pneumothorax, nodule or pleural fluid. The heart size is normal. The aorta shows stable tortuosity.  IMPRESSION: Stable COPD.  Mild bibasilar scarring/ atelectasis.   Electronically Signed   By: Aletta Edouard M.D.   On: 03/23/2014 13:14      Assessment/Plan   1. Type II respiratory failure,  acute on chronic. 2. COPD exacerbation. 3. Moderate to severe dementia. 4. Diabetes mellitus. 5. Hypertension.  Plan: 1. Admit to step down unit. 2. Continue with BiPAP. 3. Intravenous steroids. 4. Intravenous antibiotics although I'm not totally convinced that a significant infection on this admission. 5. Pulmonary consultation.  Further recommendations will depend on patient's hospital progress.   Code Status: DO NOT RESUSCITATE. This has been confirmed by the patient's daughter is at the bedside.   Family Communication: I discussed the plan with the patient's daughter is at the bedside.   Disposition Plan: Back to the skilled nursing facility when medically stable.   Time spent: 60 minutes.  Doree Albee [facility] Pager [phone].  **Disclaimer: This note may have been dictated with voice recognition software. Similar sounding words can inadvertently be transcribed and this note may contain transcription errors which may not have been corrected upon publication of note.**

## 2014-03-24 DIAGNOSIS — J9600 Acute respiratory failure, unspecified whether with hypoxia or hypercapnia: Secondary | ICD-10-CM

## 2014-03-24 DIAGNOSIS — E875 Hyperkalemia: Secondary | ICD-10-CM

## 2014-03-24 DIAGNOSIS — G9341 Metabolic encephalopathy: Secondary | ICD-10-CM | POA: Diagnosis present

## 2014-03-24 DIAGNOSIS — R0989 Other specified symptoms and signs involving the circulatory and respiratory systems: Secondary | ICD-10-CM

## 2014-03-24 DIAGNOSIS — R0609 Other forms of dyspnea: Secondary | ICD-10-CM

## 2014-03-24 LAB — GLUCOSE, CAPILLARY
GLUCOSE-CAPILLARY: 142 mg/dL — AB (ref 70–99)
Glucose-Capillary: 166 mg/dL — ABNORMAL HIGH (ref 70–99)
Glucose-Capillary: 141 mg/dL — ABNORMAL HIGH (ref 70–99)
Glucose-Capillary: 210 mg/dL — ABNORMAL HIGH (ref 70–99)

## 2014-03-24 LAB — COMPREHENSIVE METABOLIC PANEL
ALK PHOS: 58 U/L (ref 39–117)
ALT: 6 U/L (ref 0–53)
AST: 8 U/L (ref 0–37)
Albumin: 3 g/dL — ABNORMAL LOW (ref 3.5–5.2)
Anion gap: 4 — ABNORMAL LOW (ref 5–15)
BUN: 17 mg/dL (ref 6–23)
CALCIUM: 9.6 mg/dL (ref 8.4–10.5)
CHLORIDE: 96 meq/L (ref 96–112)
CO2: 41 mEq/L (ref 19–32)
Creatinine, Ser: 0.83 mg/dL (ref 0.50–1.35)
GFR, EST NON AFRICAN AMERICAN: 81 mL/min — AB (ref >90)
GLUCOSE: 149 mg/dL — AB (ref 70–99)
POTASSIUM: 6 meq/L — AB (ref 3.7–5.3)
SODIUM: 141 meq/L (ref 137–147)
Total Protein: 6.7 g/dL (ref 6.0–8.3)

## 2014-03-24 LAB — CBC
HCT: 33.4 % — ABNORMAL LOW (ref 39.0–52.0)
Hemoglobin: 9.5 g/dL — ABNORMAL LOW (ref 13.0–17.0)
MCH: 27.6 pg (ref 26.0–34.0)
MCHC: 28.4 g/dL — AB (ref 30.0–36.0)
MCV: 97.1 fL (ref 78.0–100.0)
PLATELETS: 152 10*3/uL (ref 150–400)
RBC: 3.44 MIL/uL — AB (ref 4.22–5.81)
RDW: 16.2 % — ABNORMAL HIGH (ref 11.5–15.5)
WBC: 5.7 10*3/uL (ref 4.0–10.5)

## 2014-03-24 LAB — POTASSIUM: Potassium: 5.1 mEq/L (ref 3.7–5.3)

## 2014-03-24 MED ORDER — SODIUM POLYSTYRENE SULFONATE 15 GM/60ML PO SUSP
45.0000 g | Freq: Once | ORAL | Status: AC
  Administered 2014-03-24: 45 g via ORAL
  Filled 2014-03-24: qty 180

## 2014-03-24 NOTE — Clinical Social Work Placement (Signed)
    Clinical Social Work Department CLINICAL SOCIAL WORK PLACEMENT NOTE 03/30/2014  Patient:  [name] [name], [name] [name]  Account Number:  [id number] Admit date:  03/23/2014  Clinical Social Worker:  Edwyna Shell, CLINICAL SOCIAL WORKER  Date/time:  03/24/2014 10:00 AM  Clinical Social Work is seeking post-discharge placement for this patient at the following level of care:   [place]   (*CSW will update this form in Epic as items are completed)   03/24/2014  Patient/family provided with [place] Department of Clinical Social Work's list of facilities offering this level of care within the geographic area requested by the patient (or if unable, by the patient's family).  03/24/2014  Patient/family informed of their freedom to choose among providers that offer the needed level of care, that participate in Medicare, Medicaid or managed care program needed by the patient, have an available bed and are willing to accept the patient.  03/24/2014  Patient/family informed of [facility]' ownership interest in [facility], as well as of the fact that they are under no obligation to receive care at this facility.  PASARR submitted to EDS on 03/24/2014 PASARR number received on 03/24/2014  FL2 transmitted to all facilities in geographic area requested by pt/family on  03/24/2014 FL2 transmitted to all facilities within larger geographic area on   Patient informed that his/her managed care company has contracts with or will negotiate with  certain facilities, including the following:     Patient/family informed of bed offers received:  03/25/2014 Patient chooses bed at [facility] Physician recommends and patient chooses bed at  [facility]  Patient to be transferred to [facility] on  03/30/2014 Patient to be transferred to facility by [facility] staff via tunnel Patient and family notified of transfer on 03/30/2014 Name of family member notified:  Daughter  Rosendo Gros [facility] POA  The following physician request were entered in Epic:   Additional Comments:  Edwyna Shell, LCSW Clinical Social Worker [phone])

## 2014-03-24 NOTE — Progress Notes (Signed)
While talking to patient during midday med pass, patient states "They got my nerves all tore up down there. Keeps me nervous down there. Makes me think they gonna hurt me or somethin'". Asked patient if he was feeling nervous now and patient denies. Family at bedside present and on the phone to hear patient concerns. Family very upset but pleased with nursing care patient is receiving at this facility.

## 2014-03-24 NOTE — Care Management Note (Addendum)
    Page 1 of 1   03/30/2014     2:19:09 PM CARE MANAGEMENT NOTE 03/30/2014  Patient:  [name] [name], [name] [name]   Account Number:  [id number]  Date Initiated:  03/24/2014  Documentation initiated by:  Theophilus Kinds  Subjective/Objective Assessment:   Pt admitted from [facility] with respiratory distress. Pt will return to SNF at discharge.     Action/Plan:   CSW will arrange discharge to facility when medically stable.   Anticipated DC Date:  03/31/2014   Anticipated DC Plan:  SKILLED NURSING FACILITY  In-house referral  Clinical Social Worker      DC Planning Services  CM consult      Choice offered to / List presented to:             Status of service:  Completed, signed off Medicare Important Message given?  YES (If response is "NO", the following Medicare IM given date fields will be blank) Date Medicare IM given:  03/26/2014 Medicare IM given by:  Christinia Gully C Date Additional Medicare IM given:  03/30/2014 Additional Medicare IM given by:  Theophilus Kinds  Discharge Disposition:  [place]  Per UR Regulation:    If discussed at Long Length of Stay Meetings, dates discussed:   03/30/2014    Comments:  03/30/14 1420 Wirt Hemmerich, RNBSN CM Pt discharged to [facility] center today. CSW to arrange discharge to facility.  03/26/14 [place], RN BSN CM Pt potential discharge over the weekend to [facility]. 2nd IM delivered. CSW to arrange discharge when medically stable.  03/24/14 [place], RN BSN CM

## 2014-03-24 NOTE — Consult Note (Signed)
Consult requested by: [facility] Consult requested for respiratory failure:  HPI: This is an 80 year old who has multiple medical problems including chronic respiratory failure. He has severe COPD and according to his family he has not been on BiPAP for the last 3 days at his skilled care facility. There appears to be some confusion about exactly how to do BiPAP at the facility. In any rate he became less responsive and was brought to the emergency department and found to have a PCO2 of 95. He was placed on BiPAP and has improved. This morning he is awake and talking but still on BiPAP  Past Medical History  Diagnosis Date  . Glaucoma   . Anxiety   . GERD (gastroesophageal reflux disease)   . Hyperlipidemia   . Hypertension   . Chronic abdominal pain   . 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 Chronic respiratory failure   . Altered mental status 06/12/2012; 08/14/2012    Unwitnessed isolated seizure suspected, but because of the EEG being normal, Dr. Merlene Laughter did not recommend antiseizure therapy.; "violent since 08/13/2012" (08/14/2012)  . Hard of hearing   . Seizure 06/14/2012    Isolated seizure suspected, but not confirmed.  . Bradycardia   . Complication of anesthesia 2005    "couldn't get him woke up when he should have after eye surgery" (08/14/2012)  . Mesothelioma   . Pleural plaque 04/2012  . Exertional dyspnea   . DM type 2 (diabetes mellitus, type 2) 06/13/2012  . UTI (lower urinary tract infection)      Family History  Problem Relation Age of Onset  . Lung cancer Sister   . Breast cancer Sister      History   Social History  . Marital Status: Widowed    Spouse Name: N/A    Number of Children: 8  . Years of Education: N/A   Occupational History  . diasable     Social History Main Topics  . Smoking status: Former Smoker -- 2.00 packs/day for 40 years    Types: Cigarettes  . Smokeless tobacco: Never Used     Comment: "quit smoking  cigarettes ~ 2001"  . Alcohol Use: Yes     Comment: 08/14/2012 "used to drink heavily; stopped ~ 2001"  . Drug Use: No  . Sexual Activity: None   Other Topics Concern  . None   Social History Narrative  . None     ROS: Unobtainable    Objective: Vital signs in last 24 hours: Temp:  [98.5 F (36.9 C)-98.9 F (37.2 C)] 98.5 F (36.9 C) (07/15 0400) Pulse Rate:  [58-119] 119 (07/15 0600) Resp:  [12-26] 13 (07/15 0600) BP: (86-138)/(53-86) 132/81 mmHg (07/15 0600) SpO2:  [88 %-100 %] 94 % (07/15 0600) FiO2 (%):  [30 %-40 %] 40 % (07/15 0652) Weight:  [80 kg (176 lb 5.9 oz)-80.7 kg (177 lb 14.6 oz)] 80 kg (176 lb 5.9 oz) (07/15 0500) Weight change:     Intake/Output from previous day: 07/14 0701 - 07/15 0700 In: 350 [I.V.:300; IV Piggyback:50] Out: -   PHYSICAL EXAM He is awake and talking but on BiPAP and difficult to understand. He is blind. His mucous membranes are dry. His nose and throat are clear. His chest is relatively clear. His heart is regular without gallop. His abdomen is soft. Extremities showed no edema. Central nervous system exam shows blind and he is mildly confused but he moves all 4 extremities  Lab Results: Basic Metabolic  Panel:  Recent Labs  03/23/14 1327 03/24/14 0449  NA 144 141  K 5.3 6.0*  CL 96 96  CO2 43* 41*  GLUCOSE 94 149*  BUN 10 17  CREATININE 0.94 0.83  CALCIUM 9.9 9.6   Liver Function Tests:  Recent Labs  03/24/14 0449  AST 8  ALT 6  ALKPHOS 58  BILITOT <0.2*  PROT 6.7  ALBUMIN 3.0*   No results found for this basename: LIPASE, AMYLASE,  in the last 72 hours No results found for this basename: AMMONIA,  in the last 72 hours CBC:  Recent Labs  03/23/14 1327 03/24/14 0449  WBC 9.7 5.7  HGB 10.4* 9.5*  HCT 37.3* 33.4*  MCV 98.9 97.1  PLT 168 152   Cardiac Enzymes:  Recent Labs  03/23/14 1327  TROPONINI <0.30   BNP:  Recent Labs  03/23/14 1327  PROBNP 293.3   D-Dimer: No results found for this  basename: DDIMER,  in the last 72 hours CBG:  Recent Labs  03/23/14 1730 03/23/14 2201  GLUCAP 148* 144*   Hemoglobin A1C: No results found for this basename: HGBA1C,  in the last 72 hours Fasting Lipid Panel: No results found for this basename: CHOL, HDL, LDLCALC, TRIG, CHOLHDL, LDLDIRECT,  in the last 72 hours Thyroid Function Tests: No results found for this basename: TSH, T4TOTAL, FREET4, T3FREE, THYROIDAB,  in the last 72 hours Anemia Panel: No results found for this basename: VITAMINB12, FOLATE, FERRITIN, TIBC, IRON, RETICCTPCT,  in the last 72 hours Coagulation: No results found for this basename: LABPROT, INR,  in the last 72 hours Urine Drug Screen: Drugs of Abuse  No results found for this basename: labopia, cocainscrnur, labbenz, amphetmu, thcu, labbarb    Alcohol Level: No results found for this basename: ETH,  in the last 72 hours Urinalysis:  Recent Labs  03/23/14 1356  COLORURINE YELLOW  LABSPEC >1.030*  PHURINE 5.5  GLUCOSEU NEGATIVE  HGBUR TRACE*  BILIRUBINUR NEGATIVE  KETONESUR TRACE*  PROTEINUR NEGATIVE  UROBILINOGEN 0.2  NITRITE NEGATIVE  [place]. Labs:   ABGS:  Recent Labs  03/23/14 1440  PHART 7.279*  PO2ART 111.0*  TCO2 38.9  HCO3 41.1*     MICROBIOLOGY: No results found for this or any previous visit (from the past 240 hour(s)).  Studies/Results: Dg Chest Portable 1 View  03/23/2014   CLINICAL DATA:  Respiratory distress and COPD.  EXAM: PORTABLE CHEST - 1 VIEW  COMPARISON:  02/26/2014  FINDINGS: Stable underlying severe emphysematous lung disease. Mild bibasilar scarring/ atelectasis present. There is no evidence of pulmonary edema, consolidation, pneumothorax, nodule or pleural fluid. The heart size is normal. The aorta shows stable tortuosity.  IMPRESSION: Stable COPD.  Mild bibasilar scarring/ atelectasis.   Electronically Signed   By: Aletta Edouard M.D.   On: 03/23/2014 13:14    Medications:  Prior  to Admission:  Prescriptions prior to admission  Medication Sig Dispense Refill  . acetaminophen (TYLENOL) 650 MG CR tablet Take 650 mg by mouth 2 (two) times daily.      Marland Kitchen albuterol (PROVENTIL) (2.5 MG/3ML) 0.083% nebulizer solution Take 2.5 mg by nebulization 4 (four) times daily.      Marland Kitchen amLODipine (NORVASC) 5 MG tablet Take 1 tablet (5 mg total) by mouth daily.  30 tablet  0  . antiseptic oral rinse (BIOTENE) LIQD 15 mLs by Mouth Rinse route 2 times daily at 12 noon and 4 pm.      . brimonidine (ALPHAGAN  P) 0.1 % SOLN Place 1 drop into both eyes 2 (two) times daily.       . carbamide peroxide (DEBROX) 6.5 % otic solution Place 5 drops into both ears 2 (two) times daily. For 4 days, started on 03/19/2014      . citalopram (CELEXA) 10 MG tablet Take 10 mg by mouth daily.      . divalproex (DEPAKOTE SPRINKLE) 125 MG capsule Take 500 mg by mouth 3 (three) times daily.      Marland Kitchen donepezil (ARICEPT) 10 MG tablet Take 10 mg by mouth at bedtime.      . Dorzolamide HCl-Timolol Mal PF 22.3-6.8 MG/ML SOLN Place 1 drop into the right eye 2 (two) times daily.       . ferrous sulfate 325 (65 FE) MG EC tablet Take 325 mg by mouth 2 (two) times daily.      Marland Kitchen latanoprost (XALATAN) 0.005 % ophthalmic solution Place 1 drop into the right eye at bedtime.       . levETIRAcetam (KEPPRA) 250 MG tablet Take 250 mg by mouth 2 (two) times daily.      Marland Kitchen loratadine (CLARITIN) 10 MG tablet Take 10 mg by mouth daily.      Marland Kitchen LORazepam (ATIVAN) 0.5 MG tablet Take 1 tablet (0.5 mg total) by mouth 2 (two) times daily.  10 tablet  0  . LORazepam (ATIVAN) 1 MG tablet Take 1 mg by mouth at bedtime.      . lovastatin (MEVACOR) 40 MG tablet Take 40 mg by mouth at bedtime.        . metFORMIN (GLUCOPHAGE) 500 MG tablet Take 500 mg by mouth 2 (two) times daily with a meal.      . montelukast (SINGULAIR) 10 MG tablet Take 10 mg by mouth every morning.       . montelukast (SINGULAIR) 10 MG tablet Take 10 mg by mouth at bedtime.      Marland Kitchen  omeprazole (PRILOSEC) 20 MG capsule Take 20 mg by mouth daily.      . pilocarpine (PILOCAR) 1 % ophthalmic solution Place 1 drop into the right eye 4 (four) times daily.       . polyethylene glycol (MIRALAX / GLYCOLAX) packet Take 17 g by mouth daily.      Marland Kitchen senna-docusate (DOC-Q-LAX) 8.6-50 MG per tablet Take 1 tablet by mouth 2 (two) times daily.      . vitamin C (ASCORBIC ACID) 500 MG tablet Take 500 mg by mouth 2 (two) times daily.      Marland Kitchen SOLU-MEDROL 125 MG injection Inject 125 mg into the muscle once. Cough/congestion       Scheduled: . albuterol  2.5 mg Nebulization QID  . amLODipine  5 mg Oral Daily  . antiseptic oral rinse  15 mL Mouth Rinse q12n4p  . brimonidine  1 drop Both Eyes TID  . cefTRIAXone (ROCEPHIN)  IV  1 g Intravenous Q24H  . chlorhexidine  15 mL Mouth Rinse BID  . citalopram  10 mg Oral Daily  . divalproex  500 mg Oral TID  . donepezil  10 mg Oral QHS  . dorzolamide-timolol  1 drop Right Eye BID  . ferrous sulfate  325 mg Oral BID  . heparin  5,000 Units Subcutaneous 3 times per day  . insulin aspart  0-15 Units Subcutaneous TID WC  . insulin aspart  0-5 Units Subcutaneous QHS  . latanoprost  1 drop Right Eye QHS  . levETIRAcetam  250 mg Oral  BID  . loratadine  10 mg Oral Daily  . LORazepam  0.5 mg Oral BID  . LORazepam  1 mg Oral QHS  . metFORMIN  500 mg Oral BID WC  . methylPREDNISolone (SOLU-MEDROL) injection  125 mg Intravenous Q6H  . montelukast  10 mg Oral QHS  . pantoprazole  40 mg Oral Daily  . pilocarpine  1 drop Right Eye QID  . polyethylene glycol  17 g Oral Daily  . senna-docusate  1 tablet Oral BID  . simvastatin  20 mg Oral q1800  . vitamin C  500 mg Oral BID   Continuous: . sodium chloride 100 mL/hr at 03/23/14 2100   BTD:HRCBULAGTXM (ZOFRAN) IV, ondansetron  Assesment: He he was admitted with acute on chronic respiratory failure. He has responded to BiPAP. He has severe COPD with chronic respiratory failure. He is going to require BiPAP  long term. He may be able to be weaned off of BiPAP this morning but will need to be on it every night. 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 I will be out of town after today but will return on the 20th.    LOS: 1 day   Melissia Lahman L 03/24/2014, 8:39 AM

## 2014-03-24 NOTE — Progress Notes (Signed)
NP from [facility] called and asked to speak with the patients family or to get an update on patients condition. I was asked earlier by the family that no information was to be given out to anyone from the nursing facility. Spoke with Camile over the phone and she requested that I give her number [phone] to NP. NP stated she had her number, refused to take number from me, stated she was the provider, and stated she would handle the situation when and if they patient returned to the facility. Dr. Roderic Palau notified.

## 2014-03-24 NOTE — Progress Notes (Signed)
While talking with family about his plan of care regarding use of the Bipap at this time patient states "They act like they don't know how that machine works down there" referring to the nursing home. Patients family present at this time.

## 2014-03-24 NOTE — Clinical Social Work Psychosocial (Signed)
Clinical Social Work Department BRIEF PSYCHOSOCIAL ASSESSMENT 03/24/2014  Patient:  [name] [name]     Account Number:  [id number]     Admit date:  03/23/2014  Clinical Social Worker:  Edwyna Shell, CLINICAL SOCIAL WORKER  Date/Time:  03/24/2014 10:00 AM  Referred by:  Physician  Date Referred:  03/24/2014 Referred for  SNF Placement   Other Referral:   Interview type:  Family Other interview type:   Patient on bipap, oriented to person only per chart, unable to be assessed at present.    PSYCHOSOCIAL DATA Living Status:  FACILITY Admitted from facility:  [facility] Level of care:  [facility] Primary support name:  [name] [name] Primary support relationship to patient:  CHILD, ADULT Degree of support available:   Patient has 5 living children, all live locally and are very involved and supportive.  From [facility], family is not satisfied w care received at [facility].    CURRENT CONCERNS Current Concerns  Post-Acute Placement   Other Concerns:    SOCIAL WORK ASSESSMENT / PLAN CSW unable to assess patient directly, patient oriented to person only and on Bipap.  Daughter and son in room provided initial history.  Patient has been at [facility] since Dec 2013, family is very dissatisfied w care received at [facility].  Patient was discharged on Bipap after last [facility] hospitalization in June 2015, per family, patient has not been on Bipap for last 3 nights.  Per famliy, SNF says patient refused bipap; however, at [facility] patient has been very cooperative w mask.  Responsible party, [name] [name], was not notified of patient refusal.  Issue was reportedly discovered during visit from [facility] nurse practitioner at SNF - noted patient had not been wearing bipap and was concerned about patients care.  NP asked that patient be placed on bipap and observed, facility refused, contacted patient's MD, who advised that patient be taken to ED for evaluation.  Family feels that patient did not  refuse bipap, and felt they should have been notified if he did "I live 5 minutes away - if they had an issue with him wearing the mask, I would have come down and helped."    Family does not want him to return to [facility] at discharge. Feel they have tried to get patient care needs addressed and have been unsuccessful.  Want CSW to explore alternative SNF beds in [place] and [place].  CSW discussed possible need to broaden search area to include [place] counties, family will discuss after seeing whether another bed in [facility] can be secured. Do no want to talk to [facility] until they have an alternative.  Family  has previously stated that they have talked to [facility] staff, the advocate and state regulatory officials regarding concerns about patient's care at [facility].  To date, no satisfactory solution has been found to patient care issues.    CSW explained bed search process, will begin w bed search in [place] and [place].  Will meet w family if bed cannot be found in local area.  Per family, patient has been agreeable to plans made by chlidren on his behalf.   Assessment/plan status:  Psychosocial Support/Ongoing Assessment of Needs Other assessment/ plan:   Information/referral to community resources:   SNF list    PATIENT'S/FAMILY'S RESPONSE TO PLAN OF CARE: Family does not want any contact w [facility], does not want any visits from [facility] staff.  Frustrated w care received at [facility].  Edwyna Shell, LCSW Clinical Social Worker [phone])
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
## 2014-03-24 NOTE — Progress Notes (Signed)
Pt is in no obvious or stated distress. Denies pain, No requests at this time. Pt is able to rest.

## 2014-03-24 NOTE — Progress Notes (Signed)
UR chart review completed.  

## 2014-03-24 NOTE — Progress Notes (Signed)
[facility] PROGRESS NOTE  [name] [name] GUR:[id number] DOB: Sep 25, 1930 DOA: 03/23/2014 PCP: [name], MD  Assessment/Plan: 1. Acute on chronic respiratory failure. Family reports that patient was not wearing BiPAP the last 3 nights prior to admission. He is chronically on 2 L of oxygen. Last PCO2 was noted to be in the 90s. Patient was on BiPAP since admission. He appears to be improving, is awake and alert at this point. Will try him off BiPAP today and change BiPAP to each bedtime. He will be continued on steroids, nebulizer treatments and antibiotics for now. Appreciate pulmonology assistance 2. COPD exacerbation. See treatment as above. Appears to be improving. 3. Moderate to severe dementia. 4. Diabetes on sliding scale insulin and metformin. 5. Hypertension. Blood pressures appear to be stable. 6. Hyperkalemia. We'll give one dose of Kayexalate. His improving PCO2 should also help with his potassium levels.  Code Status: DO NOT RESUSCITATE Family Communication: Discussed with family members at the bedside Disposition Plan: Skilled nursing facility placement once respiratory status stabilizes   Consultants:  Pulmonology, Dr. Luan Pulling  Procedures:    Antibiotics:  Rocephin 7/14  HPI/Subjective: Patient reports that he is having difficulty breathing and feels is related to the BiPAP mask. Denies any chest pain  Objective: Filed Vitals:   03/24/14 0930  BP: 106/60  Pulse: 56  Temp:   Resp: 13    Intake/Output Summary (Last 24 hours) at 03/24/14 1122 Last data filed at 03/24/14 0900  Gross per 24 hour  Intake   1550 ml  Output      0 ml  Net   1550 ml   Filed Weights   03/23/14 1706 03/24/14 0500  Weight: 80.7 kg (177 lb 14.6 oz) 80 kg (176 lb 5.9 oz)    Exam:   General:  The patient is on BiPAP. He is awake. Does not appear in any distress  Cardiovascular: S1, S2, regular rate and rhythm  Respiratory: Air movement is fair bilaterally, no  wheezes  Abdomen: Soft, nontender, positive bowel sounds  Musculoskeletal: No pedal edema bilaterally   Data Reviewed: Basic Metabolic Panel:  Recent Labs Lab 03/23/14 1327 03/24/14 0449  NA 144 141  K 5.3 6.0*  CL 96 96  CO2 43* 41*  GLUCOSE 94 149*  BUN 10 17  CREATININE 0.94 0.83  CALCIUM 9.9 9.6   Liver Function Tests:  Recent Labs Lab 03/24/14 0449  AST 8  ALT 6  ALKPHOS 58  BILITOT <0.2*  PROT 6.7  ALBUMIN 3.0*   No results found for this basename: LIPASE, AMYLASE,  in the last 168 hours No results found for this basename: AMMONIA,  in the last 168 hours CBC:  Recent Labs Lab 03/23/14 1327 03/24/14 0449  WBC 9.7 5.7  HGB 10.4* 9.5*  HCT 37.3* 33.4*  MCV 98.9 97.1  PLT 168 152   Cardiac Enzymes:  Recent Labs Lab 03/23/14 1327  TROPONINI <0.30   BNP (last 3 results)  Recent Labs  02/25/14 0300 03/23/14 1327  PROBNP 64.0 293.3   CBG:  Recent Labs Lab 03/23/14 1730 03/23/14 2201 03/24/14 0825  GLUCAP 148* 144* 166*    No results found for this or any previous visit (from the past 240 hour(s)).   Studies: Dg Chest Portable 1 View  03/23/2014   CLINICAL DATA:  Respiratory distress and COPD.  EXAM: PORTABLE CHEST - 1 VIEW  COMPARISON:  02/26/2014  FINDINGS: Stable underlying severe emphysematous lung disease. Mild bibasilar scarring/ atelectasis present. There is  no evidence of pulmonary edema, consolidation, pneumothorax, nodule or pleural fluid. The heart size is normal. The aorta shows stable tortuosity.  IMPRESSION: Stable COPD.  Mild bibasilar scarring/ atelectasis.   Electronically Signed   By: Aletta Edouard M.D.   On: 03/23/2014 13:14    Scheduled Meds: . albuterol  2.5 mg Nebulization QID  . amLODipine  5 mg Oral Daily  . antiseptic oral rinse  15 mL Mouth Rinse q12n4p  . brimonidine  1 drop Both Eyes TID  . cefTRIAXone (ROCEPHIN)  IV  1 g Intravenous Q24H  . chlorhexidine  15 mL Mouth Rinse BID  . citalopram  10 mg Oral  Daily  . divalproex  500 mg Oral TID  . donepezil  10 mg Oral QHS  . dorzolamide-timolol  1 drop Right Eye BID  . ferrous sulfate  325 mg Oral BID  . heparin  5,000 Units Subcutaneous 3 times per day  . insulin aspart  0-15 Units Subcutaneous TID WC  . insulin aspart  0-5 Units Subcutaneous QHS  . latanoprost  1 drop Right Eye QHS  . levETIRAcetam  250 mg Oral BID  . loratadine  10 mg Oral Daily  . LORazepam  0.5 mg Oral BID  . LORazepam  1 mg Oral QHS  . metFORMIN  500 mg Oral BID WC  . methylPREDNISolone (SOLU-MEDROL) injection  125 mg Intravenous Q6H  . montelukast  10 mg Oral QHS  . pantoprazole  40 mg Oral Daily  . pilocarpine  1 drop Right Eye QID  . polyethylene glycol  17 g Oral Daily  . senna-docusate  1 tablet Oral BID  . simvastatin  20 mg Oral q1800  . sodium polystyrene  45 g Oral Once  . vitamin C  500 mg Oral BID   Continuous Infusions: . 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 If 7PM-7AM, please contact night-coverage at [email or web address], password [facility] 03/24/2014, 11:22 AM  LOS: 1 day

## 2014-03-25 DIAGNOSIS — G9341 Metabolic encephalopathy: Secondary | ICD-10-CM

## 2014-03-25 LAB — GLUCOSE, CAPILLARY
GLUCOSE-CAPILLARY: 168 mg/dL — AB (ref 70–99)
GLUCOSE-CAPILLARY: 161 mg/dL — AB (ref 70–99)
Glucose-Capillary: 168 mg/dL — ABNORMAL HIGH (ref 70–99)
Glucose-Capillary: 172 mg/dL — ABNORMAL HIGH (ref 70–99)
Glucose-Capillary: 191 mg/dL — ABNORMAL HIGH (ref 70–99)

## 2014-03-25 LAB — BASIC METABOLIC PANEL
ANION GAP: 8 (ref 5–15)
BUN: 19 mg/dL (ref 6–23)
CO2: 40 meq/L — AB (ref 19–32)
CREATININE: 0.87 mg/dL (ref 0.50–1.35)
Calcium: 9 mg/dL (ref 8.4–10.5)
Chloride: 96 mEq/L (ref 96–112)
GFR calc Af Amer: >90 mL/min (ref >90)
GFR calc non Af Amer: 79 mL/min — ABNORMAL LOW (ref >90)
GLUCOSE: 165 mg/dL — AB (ref 70–99)
Potassium: 3.7 mEq/L (ref 3.7–5.3)
SODIUM: 144 meq/L (ref 137–147)

## 2014-03-25 LAB — CBC
HEMATOCRIT: 31.1 % — AB (ref 39.0–52.0)
HEMOGLOBIN: 9 g/dL — AB (ref 13.0–17.0)
MCH: 27.6 pg (ref 26.0–34.0)
MCHC: 28.9 g/dL — ABNORMAL LOW (ref 30.0–36.0)
MCV: 95.4 fL (ref 78.0–100.0)
Platelets: 152 10*3/uL (ref 150–400)
RBC: 3.26 MIL/uL — AB (ref 4.22–5.81)
RDW: 16.4 % — ABNORMAL HIGH (ref 11.5–15.5)
WBC: 5.3 10*3/uL (ref 4.0–10.5)

## 2014-03-25 MED ORDER — DIVALPROEX SODIUM 125 MG PO CPSP
ORAL_CAPSULE | ORAL | Status: AC
  Filled 2014-03-25: qty 4

## 2014-03-25 MED ORDER — METHYLPREDNISOLONE SODIUM SUCC 125 MG IJ SOLR
60.0000 mg | Freq: Four times a day (QID) | INTRAMUSCULAR | Status: DC
  Administered 2014-03-25 – 2014-03-26 (×4): 60 mg via INTRAVENOUS
  Filled 2014-03-25 (×4): qty 2

## 2014-03-25 NOTE — Progress Notes (Addendum)
[facility] PROGRESS NOTE  [name] [name] ZOX:[id number] DOB: 22-Apr-1931 DOA: 03/23/2014 PCP: [name], MD  Summary:  This patient was admitted to the hospital from [facility] after becoming unresponsive and having acute on chronic respiratory failure.  He has oxygen dependent COPD and has been prescribed Bipap QHS.  It was reported by family that patient had not worn bipap for at least 3 nights prior to admission.  Upon arrival to the hospital, he was restarted on bipap and treated for COPD exacerbation with steroids, nebs and antibiotics.  He has gradually improved and mental status appears to be approaching baseline.  Steroids are now being tapered.  Family is requesting discharge to an alternate SNF facility. He may likely be ready for discharge in the next 1-2 days  Assessment/Plan: 1. Acute on chronic respiratory failure. Family reports that patient was not wearing BiPAP the last 3 nights prior to admission. He is chronically on 2 L of oxygen. Initial PCO2 was noted to be in the 90s. Patient was started on BiPAP since admission and quickly started to improve. At this point, his mental status appears to be near baseline. Bipap is now QHS, as was recommended as an outpatient. Will start to wean steroids, continue nebulizer treatments and antibiotics for now. Appreciate pulmonology assistance 2. COPD exacerbation. See treatment as above. Appears to be improving. 3. Metabolic encephalopathy related to CO2 narcosis.  Appears to have improved. 4. Moderate to severe dementia. 5. Diabetes on sliding scale insulin and metformin. 6. Hypertension. Blood pressures appear to be stable. 7. Hyperkalemia. Improved.  Code Status: DO NOT RESUSCITATE Family Communication: Discussed with family members at the bedside Disposition Plan: Skilled nursing facility placement once respiratory status stabilizes, transfer to telemetry today   Consultants:  Pulmonology, Dr.  Luan Pulling  Procedures:    Antibiotics:  Rocephin 7/14  HPI/Subjective: Patient feels his breathing is improving, he has a non productive cough.   Objective: Filed Vitals:   03/25/14 0830  BP:   Pulse: 58  Temp:   Resp: 21    Intake/Output Summary (Last 24 hours) at 03/25/14 0934 Last data filed at 03/25/14 0600  Gross per 24 hour  Intake   2390 ml  Output    200 ml  Net   2190 ml   Filed Weights   03/23/14 1706 03/24/14 0500 03/25/14 0500  Weight: 80.7 kg (177 lb 14.6 oz) 80 kg (176 lb 5.9 oz) 84.1 kg (185 lb 6.5 oz)    Exam:   General:  Patient is very hard of hearing, resting comfortably, no distress  Cardiovascular: S1, S2, regular rate and rhythm  Respiratory: Air movement is fair bilaterally, no wheezes  Abdomen: Soft, nontender, appears mildly distended, positive bowel sounds  Musculoskeletal: No pedal edema bilaterally   Data Reviewed: Basic Metabolic Panel:  Recent Labs Lab 03/23/14 1327 03/24/14 0449 03/24/14 1553 03/25/14 0455  NA 144 141  --  144  K 5.3 6.0* 5.1 3.7  CL 96 96  --  96  CO2 43* 41*  --  40*  GLUCOSE 94 149*  --  165*  BUN 10 17  --  19  CREATININE 0.94 0.83  --  0.87  CALCIUM 9.9 9.6  --  9.0   Liver Function Tests:  Recent Labs Lab 03/24/14 0449  AST 8  ALT 6  ALKPHOS 58  BILITOT <0.2*  PROT 6.7  ALBUMIN 3.0*   No results found for this basename: LIPASE, AMYLASE,  in the last 168 hours  No results found for this basename: AMMONIA,  in the last 168 hours CBC:  Recent Labs Lab 03/23/14 1327 03/24/14 0449 03/25/14 0455  WBC 9.7 5.7 5.3  HGB 10.4* 9.5* 9.0*  HCT 37.3* 33.4* 31.1*  MCV 98.9 97.1 95.4  PLT 168 152 152   Cardiac Enzymes:  Recent Labs Lab 03/23/14 1327  TROPONINI <0.30   BNP (last 3 results)  Recent Labs  02/25/14 0300 03/23/14 1327  PROBNP 64.0 293.3   CBG:  Recent Labs Lab 03/23/14 2201 03/24/14 0825 03/24/14 1143 03/24/14 1658 03/24/14 2105  GLUCAP 144* 166* 141*  142* 210*    No results found for this or any previous visit (from the past 240 hour(s)).   Studies: Dg Chest Portable 1 View  03/23/2014   CLINICAL DATA:  Respiratory distress and COPD.  EXAM: PORTABLE CHEST - 1 VIEW  COMPARISON:  02/26/2014  FINDINGS: Stable underlying severe emphysematous lung disease. Mild bibasilar scarring/ atelectasis present. There is no evidence of pulmonary edema, consolidation, pneumothorax, nodule or pleural fluid. The heart size is normal. The aorta shows stable tortuosity.  IMPRESSION: Stable COPD.  Mild bibasilar scarring/ atelectasis.   Electronically Signed   By: Aletta Edouard M.D.   On: 03/23/2014 13:14    Scheduled Meds: . albuterol  2.5 mg Nebulization QID  . amLODipine  5 mg Oral Daily  . antiseptic oral rinse  15 mL Mouth Rinse q12n4p  . brimonidine  1 drop Both Eyes TID  . cefTRIAXone (ROCEPHIN)  IV  1 g Intravenous Q24H  . chlorhexidine  15 mL Mouth Rinse BID  . citalopram  10 mg Oral Daily  . divalproex  500 mg Oral TID  . donepezil  10 mg Oral QHS  . dorzolamide-timolol  1 drop Right Eye BID  . ferrous sulfate  325 mg Oral BID  . heparin  5,000 Units Subcutaneous 3 times per day  . insulin aspart  0-15 Units Subcutaneous TID WC  . insulin aspart  0-5 Units Subcutaneous QHS  . latanoprost  1 drop Right Eye QHS  . levETIRAcetam  250 mg Oral BID  . loratadine  10 mg Oral Daily  . LORazepam  0.5 mg Oral BID  . LORazepam  1 mg Oral QHS  . metFORMIN  500 mg Oral BID WC  . methylPREDNISolone (SOLU-MEDROL) injection  125 mg Intravenous Q6H  . montelukast  10 mg Oral QHS  . pantoprazole  40 mg Oral Daily  . pilocarpine  1 drop Right Eye QID  . polyethylene glycol  17 g Oral Daily  . senna-docusate  1 tablet Oral BID  . simvastatin  20 mg Oral q1800  . vitamin C  500 mg Oral BID   Continuous Infusions: . 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 If 7PM-7AM, please contact night-coverage at [email or web address], password [facility] 03/25/2014, 9:34 AM  LOS: 2 days

## 2014-03-25 NOTE — Clinical Social Work Note (Signed)
Mahlon Gammon, daughter, given bed offer.  Patient has bed offer at [facility], [facility] not offer bed.  Says granddaughter, [name], will make decision [phone]), CSW left [facility] for [facility].  Asked daughter at bedside to discuss w family, CSW stated that decision must be made by tomorrow morning so that discharge planning can proceed.  Edwyna Shell, LCSW Clinical Social Worker [phone])

## 2014-03-25 NOTE — Progress Notes (Signed)
Patient was transported to room 309, report given to [place].

## 2014-03-26 ENCOUNTER — Inpatient Hospital Stay (HOSPITAL_COMMUNITY): Payer: PRIVATE HEALTH INSURANCE

## 2014-03-26 DIAGNOSIS — J441 Chronic obstructive pulmonary disease with (acute) exacerbation: Principal | ICD-10-CM

## 2014-03-26 DIAGNOSIS — D638 Anemia in other chronic diseases classified elsewhere: Secondary | ICD-10-CM | POA: Diagnosis present

## 2014-03-26 DIAGNOSIS — E119 Type 2 diabetes mellitus without complications: Secondary | ICD-10-CM

## 2014-03-26 DIAGNOSIS — J9620 Acute and chronic respiratory failure, unspecified whether with hypoxia or hypercapnia: Secondary | ICD-10-CM

## 2014-03-26 LAB — BLOOD GAS, ARTERIAL
ACID-BASE EXCESS: 13 mmol/L — AB (ref 0.0–2.0)
Acid-Base Excess: 12.9 mmol/L — ABNORMAL HIGH (ref 0.0–2.0)
BICARBONATE: 39.6 meq/L — AB (ref 20.0–24.0)
BICARBONATE: 39.1 meq/L — AB (ref 20.0–24.0)
Delivery systems: POSITIVE
EXPIRATORY PAP: 5
Inspiratory PAP: 12
O2 CONTENT: 2 L/min
O2 Content: 2 L/min
O2 Saturation: 87.1 %
O2 Saturation: 99.1 %
PATIENT TEMPERATURE: 37
PCO2 ART: 80.8 mmHg — AB (ref 35.0–45.0)
PCO2 ART: 74 mmHg — AB (ref 35.0–45.0)
PH ART: 7.311 — AB (ref 7.350–7.450)
PH ART: 7.342 — AB (ref 7.350–7.450)
PO2 ART: 60.7 mmHg — AB (ref 80.0–100.0)
Patient temperature: 37
TCO2: 37.6 mmol/L (ref 0–100)
TCO2: 37 mmol/L (ref 0–100)
pO2, Arterial: 162 mmHg — ABNORMAL HIGH (ref 80.0–100.0)

## 2014-03-26 LAB — GLUCOSE, CAPILLARY
GLUCOSE-CAPILLARY: 140 mg/dL — AB (ref 70–99)
GLUCOSE-CAPILLARY: 146 mg/dL — AB (ref 70–99)
Glucose-Capillary: 137 mg/dL — ABNORMAL HIGH (ref 70–99)
Glucose-Capillary: 167 mg/dL — ABNORMAL HIGH (ref 70–99)

## 2014-03-26 LAB — BASIC METABOLIC PANEL
ANION GAP: 7 (ref 5–15)
BUN: 19 mg/dL (ref 6–23)
CALCIUM: 9 mg/dL (ref 8.4–10.5)
CO2: 40 mEq/L (ref 19–32)
CREATININE: 0.93 mg/dL (ref 0.50–1.35)
Chloride: 99 mEq/L (ref 96–112)
GFR calc non Af Amer: 77 mL/min — ABNORMAL LOW (ref >90)
GFR, EST AFRICAN AMERICAN: 90 mL/min — AB (ref >90)
Glucose, Bld: 146 mg/dL — ABNORMAL HIGH (ref 70–99)
Potassium: 3.9 mEq/L (ref 3.7–5.3)
SODIUM: 146 meq/L (ref 137–147)

## 2014-03-26 LAB — CBC
HCT: 30.2 % — ABNORMAL LOW (ref 39.0–52.0)
Hemoglobin: 8.8 g/dL — ABNORMAL LOW (ref 13.0–17.0)
MCH: 27.8 pg (ref 26.0–34.0)
MCHC: 29.1 g/dL — AB (ref 30.0–36.0)
MCV: 95.6 fL (ref 78.0–100.0)
Platelets: 150 10*3/uL (ref 150–400)
RBC: 3.16 MIL/uL — AB (ref 4.22–5.81)
RDW: 16.4 % — ABNORMAL HIGH (ref 11.5–15.5)
WBC: 6.1 10*3/uL (ref 4.0–10.5)

## 2014-03-26 LAB — IRON AND TIBC
IRON: 37 ug/dL — AB (ref 42–135)
Saturation Ratios: 13 % — ABNORMAL LOW (ref 20–55)
TIBC: 275 ug/dL (ref 215–435)
UIBC: 238 ug/dL (ref 125–400)

## 2014-03-26 LAB — VITAMIN B12: VITAMIN B 12: 450 pg/mL (ref 211–911)

## 2014-03-26 MED ORDER — IPRATROPIUM-ALBUTEROL 0.5-2.5 (3) MG/3ML IN SOLN: 3.0000 mL | RESPIRATORY_TRACT | Status: DC

## 2014-03-26 MED ORDER — IPRATROPIUM-ALBUTEROL 0.5-2.5 (3) MG/3ML IN SOLN
3.0000 mL | RESPIRATORY_TRACT | Status: DC
  Administered 2014-03-26 – 2014-03-27 (×9): 3 mL via RESPIRATORY_TRACT
  Filled 2014-03-26 (×10): qty 3

## 2014-03-26 MED ORDER — METHYLPREDNISOLONE SODIUM SUCC 125 MG IJ SOLR
80.0000 mg | Freq: Four times a day (QID) | INTRAMUSCULAR | Status: DC
  Administered 2014-03-26 – 2014-03-28 (×10): 80 mg via INTRAVENOUS
  Filled 2014-03-26 (×10): qty 2

## 2014-03-26 MED ORDER — AZITHROMYCIN 250 MG PO TABS
250.0000 mg | ORAL_TABLET | Freq: Every day | ORAL | Status: DC
  Filled 2014-03-26: qty 1

## 2014-03-26 MED ORDER — AZITHROMYCIN 250 MG PO TABS
500.0000 mg | ORAL_TABLET | Freq: Every day | ORAL | Status: AC
  Administered 2014-03-26: 500 mg via ORAL
  Filled 2014-03-26: qty 2

## 2014-03-26 MED ORDER — INSULIN GLARGINE 100 UNIT/ML ~~LOC~~ SOLN
12.0000 [IU] | Freq: Every day | SUBCUTANEOUS | Status: DC
  Administered 2014-03-26 – 2014-03-29 (×4): 12 [IU] via SUBCUTANEOUS
  Filled 2014-03-26 (×5): qty 0.12

## 2014-03-26 NOTE — Clinical Social Work Placement (Signed)
Clinical Social Work Department CLINICAL SOCIAL WORK PLACEMENT NOTE 03/26/2014  Patient:  [name] [name], [name] [name]  Account Number:  [id number] Admit date:  03/23/2014  Clinical Social Worker:  Edwyna Shell, CLINICAL SOCIAL WORKER  Date/time:  03/24/2014 10:00 AM  Clinical Social Work is seeking post-discharge placement for this patient at the following level of care:   [place]   (*CSW will update this form in Epic as items are completed)   03/24/2014  Patient/family provided with [place] Department of Clinical Social Work's list of facilities offering this level of care within the geographic area requested by the patient (or if unable, by the patient's family).  03/24/2014  Patient/family informed of their freedom to choose among providers that offer the needed level of care, that participate in Medicare, Medicaid or managed care program needed by the patient, have an available bed and are willing to accept the patient.  03/24/2014  Patient/family informed of [facility]' ownership interest in [facility], as well as of the fact that they are under no obligation to receive care at this facility.  PASARR submitted to EDS on 03/24/2014 PASARR number received on 03/24/2014  FL2 transmitted to all facilities in geographic area requested by pt/family on  03/24/2014 FL2 transmitted to all facilities within larger geographic area on   Patient informed that his/her managed care company has contracts with or will negotiate with  certain facilities, including the following:     Patient/family informed of bed offers received:  03/25/2014 Patient chooses bed at [facility] Physician recommends and patient chooses bed at  [facility]  Patient to be transferred to  on   Patient to be transferred to facility by  Patient and family notified of transfer on  Name of family member notified:    The following physician request were entered in Epic:   Additional  Comments:  Benay Pike, [place]

## 2014-03-26 NOTE — Progress Notes (Signed)
[facility] PROGRESS NOTE  [name] [name] JSE:[id number] DOB: 1931/04/18 DOA: 03/23/2014 PCP: [name], MD  Summary:  Per Dr. Roderic Palau on 7/16: This patient was admitted to the hospital from [facility] after becoming unresponsive and having acute on chronic respiratory failure.  He has oxygen dependent COPD and has been prescribed Bipap QHS.  It was reported by family that patient had not worn bipap for at least 3 nights prior to admission.  Upon arrival to the hospital, he was restarted on bipap and treated for COPD exacerbation with steroids, nebs and antibiotics.  He has gradually improved and mental status appears to be approaching baseline.  Steroids are now being tapered.  Family is requesting discharge to an alternate SNF facility. He may likely be ready for discharge in the next 1-2 days  Assessment/Plan: 1. Acute on chronic respiratory failure with hypercapnia. Family reports that patient was not wearing BiPAP the last 3 nights prior to admission. He is chronically on 2 L of oxygen. Initial PCO2 was noted to be in the 90s. Patient was started on BiPAP since admission and quickly started to improve.  Bipap is now QHS, as was recommended as an outpatient. Steroids were weaned to half yesterday which may have been too much of a decrease as he appears to be more short of breath and wheezing more, though I do not know his baseline. Also, he appears to be a little more lethargic following his breathing treatment this morning so I am concerned about the oxygen infusion given to him during nebulizer treatments. I discussed this with the respiratory therapist who will infuse less oxygen during breathing treatments. We'll order a followup ABG and chest x-ray. We'll also add Atrovent to albuterol for better bronchodilator therapy. Will increase Solu-Medrol to 80 mg every 6 hours. We'll add azithromycin to ceftriaxone. 2. COPD exacerbation. See treatment as above. His breathing is a little labored  this morning and he has scattered bronchospasms. 3. Metabolic encephalopathy related to CO2 narcosis.  Appears to have improved, but he is a little more lethargic this morning, though appropriate with his answers. 4. Moderate to severe dementia. 5. Diabetes on sliding scale insulin and metformin. His CBGs are reasonable and generally below 200. With the increase in Solu-Medrol again, will add Lantus. We'll order hemoglobin A1c. 6. Hypertension. Blood pressures appear to be stable. Continue amlodipine. 7. Hyperkalemia. Resolved. 8. Anemia, likely of chronic disease. We'll order some anemia studies and a TSH for further evaluation. Will Hemoccult stools.  Code Status: DO NOT RESUSCITATE Family Communication: Discussed with family members at the bedside Disposition Plan: Skilled nursing facility placement once respiratory status stabilizes, transfer to telemetry today   Consultants:  Pulmonology, Dr. Luan Pulling  Procedures:    Antibiotics:  Oral azithromycin 7/17>>  Rocephin 7/14>>  HPI/Subjective: The patient had just received a breathing treatment and just finished eating. He says that his breathing is "harder". He denies chest pain.   Objective: Filed Vitals:   03/26/14 0830  BP:   Pulse: 60  Temp:   Resp:    temperature 98.5. Pulse 60. Respiratory rate 24. Pressure 108/73. Oxygen saturation 100% on 2 L of oxygen (with recent nebulizer).  Intake/Output Summary (Last 24 hours) at 03/26/14 0928 Last data filed at 03/26/14 0915  Gross per 24 hour  Intake   1010 ml  Output    300 ml  Net    710 ml   Filed Weights   03/23/14 1706 03/24/14 0500 03/25/14 0500  Weight: 80.7  kg (177 lb 14.6 oz) 80 kg (176 lb 5.9 oz) 84.1 kg (185 lb 6.5 oz)    Exam:   General:  Patient is very hard of hearing. His eyes are closed, but he answers appropriately.  Cardiovascular: S1, S2, regular rate and rhythm  Respiratory: His breathing is mildly labored. There are scattered expiratory  wheezes bilaterally.  Abdomen: Soft, nontender, appears mildly distended, positive bowel sounds  Musculoskeletal: No pedal edema bilaterally   Neurologic: He is very hard of hearing. He is mildly lethargic, but his speech is clear when he answers. He follows small directions.  Data Reviewed: Basic Metabolic Panel:  Recent Labs Lab 03/23/14 1327 03/24/14 0449 03/24/14 1553 03/25/14 0455 03/26/14 0549  NA 144 141  --  144 146  K 5.3 6.0* 5.1 3.7 3.9  CL 96 96  --  96 99  CO2 43* 41*  --  40* 40*  GLUCOSE 94 149*  --  165* 146*  BUN 10 17  --  19 19  CREATININE 0.94 0.83  --  0.87 0.93  CALCIUM 9.9 9.6  --  9.0 9.0   Liver Function Tests:  Recent Labs Lab 03/24/14 0449  AST 8  ALT 6  ALKPHOS 58  BILITOT <0.2*  PROT 6.7  ALBUMIN 3.0*   No results found for this basename: LIPASE, AMYLASE,  in the last 168 hours No results found for this basename: AMMONIA,  in the last 168 hours CBC:  Recent Labs Lab 03/23/14 1327 03/24/14 0449 03/25/14 0455 03/26/14 0549  WBC 9.7 5.7 5.3 6.1  HGB 10.4* 9.5* 9.0* 8.8*  HCT 37.3* 33.4* 31.1* 30.2*  MCV 98.9 97.1 95.4 95.6  PLT 168 152 152 150   Cardiac Enzymes:  Recent Labs Lab 03/23/14 1327  TROPONINI <0.30   BNP (last 3 results)  Recent Labs  02/25/14 0300 03/23/14 1327  PROBNP 64.0 293.3   CBG:  Recent Labs Lab 03/25/14 1114 03/25/14 1631 03/25/14 2100 03/25/14 2101 03/26/14 0809  GLUCAP 168* 161* 172* 191* 137*    No results found for this or any previous visit (from the past 240 hour(s)).   Studies: No results found.  Scheduled Meds: . amLODipine  5 mg Oral Daily  . antiseptic oral rinse  15 mL Mouth Rinse q12n4p  . brimonidine  1 drop Both Eyes TID  . cefTRIAXone (ROCEPHIN)  IV  1 g Intravenous Q24H  . chlorhexidine  15 mL Mouth Rinse BID  . citalopram  10 mg Oral Daily  . divalproex  500 mg Oral TID  . donepezil  10 mg Oral QHS  . dorzolamide-timolol  1 drop Right Eye BID  . ferrous  sulfate  325 mg Oral BID  . heparin  5,000 Units Subcutaneous 3 times per day  . insulin aspart  0-15 Units Subcutaneous TID WC  . insulin aspart  0-5 Units Subcutaneous QHS  . ipratropium-albuterol  3 mL Nebulization Q4H  . latanoprost  1 drop Right Eye QHS  . levETIRAcetam  250 mg Oral BID  . loratadine  10 mg Oral Daily  . LORazepam  0.5 mg Oral BID  . LORazepam  1 mg Oral QHS  . metFORMIN  500 mg Oral BID WC  . methylPREDNISolone (SOLU-MEDROL) injection  60 mg Intravenous Q6H  . montelukast  10 mg Oral QHS  . pantoprazole  40 mg Oral Daily  . pilocarpine  1 drop Right Eye QID  . polyethylene glycol  17 g Oral Daily  . senna-docusate  1 tablet Oral BID  . simvastatin  20 mg Oral q1800  . vitamin C  500 mg Oral BID   Continuous Infusions:    Active Problems:   Senile dementia with delusional or depressive features   Chronic respiratory failure   HYPERTENSION   Anemia   DM type 2 (diabetes mellitus, type 2)   COPD exacerbation   COPD with acute exacerbation   Hyperkalemia   Encephalopathy, metabolic    Time spent: 09OBSJ    [place] [facility] Pager [phone] If 7PM-7AM, please contact night-coverage at [email or web address], password [facility] 03/26/2014, 9:28 AM  LOS: 3 days

## 2014-03-26 NOTE — Progress Notes (Signed)
CRITICAL VALUE ALERT  Critical value received:  ABG  Date of notification:  03/26/14   Time of notification:  1601  Critical value read back:Yes.    Nurse who received alert:  Gershon Cull, RN  MD notified (1st page):  Dr. Caryn Section  Time of first page:  1305  MD notified (2nd page):  Time of second page:  Responding MD:  Dr. Caryn Section  Time MD responded:  1315  Dr. Caryn Section on unit and gave order to leave on Bipap until 4 p.m. RT notified.

## 2014-03-26 NOTE — Progress Notes (Signed)
[facility] SURGICAL UNIT [address] [id number] [place] [address] Phone: [phone] Fax: [phone]  March 26, 2014            To Whom It May Concern:  Please excuse Mr. Rodney Cruise absence from work on 03/26/2014. He was with Mr. Ayham Word who is a patient at [facility]. Mr Braulio Conte is able to return to work on 03/27/2014.  Sincerely,    Walden Field, Nurse Practitioner, ANP-C [facility]

## 2014-03-26 NOTE — Clinical Social Work Note (Addendum)
[facility] is now able to offer bed. Pt's granddaughter (HCPOA per family) accepts and will complete paperwork at facility today. [place] notified. Possible d/c this weekend per MD notes. MD aware of bed offer.   Benay Pike, [place]

## 2014-03-26 NOTE — Progress Notes (Signed)
CRITICAL VALUE ALERT  Critical value received:  ABC ph 7.311, PCO2 80.8, pO2 60.7, Bicarb 39.6, Sat 87% on 2L [place]  Date of notification:  03/26/14  Time of notification:  0954  Critical value read back:Yes.    Nurse who received alert:  Gershon Cull, RN  MD notified (1st page):  Dr. Caryn Section  Time of first page:  52  MD notified (2nd page):  Time of second page:  Responding MD:  Dr. Caryn Section  Time MD responded:  1020  RT notified to place on Bipap.

## 2014-03-26 NOTE — Clinical Social Work Note (Signed)
Per Marianna Fuss, family has been to facility and signed preadmission paperwork.  Facility willing to admit over weekend if ready for discharge.  Edwyna Shell, LCSW Clinical Social Worker [phone])

## 2014-03-26 NOTE — Progress Notes (Signed)
Per Dr.Fisher patient to come of BiPAP and to use at night as had been previously ordered.

## 2014-03-27 DIAGNOSIS — J90 Pleural effusion, not elsewhere classified: Secondary | ICD-10-CM | POA: Diagnosis not present

## 2014-03-27 DIAGNOSIS — J189 Pneumonia, unspecified organism: Secondary | ICD-10-CM | POA: Diagnosis present

## 2014-03-27 LAB — CBC
HCT: 33.3 % — ABNORMAL LOW (ref 39.0–52.0)
Hemoglobin: 9.8 g/dL — ABNORMAL LOW (ref 13.0–17.0)
MCH: 27.8 pg (ref 26.0–34.0)
MCHC: 29.4 g/dL — ABNORMAL LOW (ref 30.0–36.0)
MCV: 94.3 fL (ref 78.0–100.0)
Platelets: 154 10*3/uL (ref 150–400)
RBC: 3.53 MIL/uL — AB (ref 4.22–5.81)
RDW: 16.3 % — ABNORMAL HIGH (ref 11.5–15.5)
WBC: 4.4 10*3/uL (ref 4.0–10.5)

## 2014-03-27 LAB — GLUCOSE, CAPILLARY
GLUCOSE-CAPILLARY: 131 mg/dL — AB (ref 70–99)
Glucose-Capillary: 143 mg/dL — ABNORMAL HIGH (ref 70–99)
Glucose-Capillary: 172 mg/dL — ABNORMAL HIGH (ref 70–99)
Glucose-Capillary: 164 mg/dL — ABNORMAL HIGH (ref 70–99)

## 2014-03-27 LAB — BASIC METABOLIC PANEL
Anion gap: 10 (ref 5–15)
BUN: 22 mg/dL (ref 6–23)
CALCIUM: 9.2 mg/dL (ref 8.4–10.5)
CHLORIDE: 95 meq/L — AB (ref 96–112)
CO2: 36 mEq/L — ABNORMAL HIGH (ref 19–32)
CREATININE: 0.91 mg/dL (ref 0.50–1.35)
GFR, EST NON AFRICAN AMERICAN: 78 mL/min — AB (ref >90)
Glucose, Bld: 148 mg/dL — ABNORMAL HIGH (ref 70–99)
Potassium: 4.2 mEq/L (ref 3.7–5.3)
Sodium: 141 mEq/L (ref 137–147)

## 2014-03-27 LAB — FERRITIN: FERRITIN: 118 ng/mL (ref 22–322)

## 2014-03-27 LAB — PRO B NATRIURETIC PEPTIDE: Pro B Natriuretic peptide (BNP): 530.9 pg/mL — ABNORMAL HIGH (ref 0–450)

## 2014-03-27 LAB — HEMOGLOBIN A1C
Hgb A1c MFr Bld: 6.2 % — ABNORMAL HIGH (ref <5.7)
MEAN PLASMA GLUCOSE: 131 mg/dL — AB (ref <117)

## 2014-03-27 MED ORDER — IPRATROPIUM-ALBUTEROL 0.5-2.5 (3) MG/3ML IN SOLN
3.0000 mL | Freq: Four times a day (QID) | RESPIRATORY_TRACT | Status: DC
  Administered 2014-03-27 – 2014-03-30 (×11): 3 mL via RESPIRATORY_TRACT
  Filled 2014-03-27 (×11): qty 3

## 2014-03-27 MED ORDER — FUROSEMIDE 10 MG/ML IJ SOLN
20.0000 mg | Freq: Once | INTRAMUSCULAR | Status: AC
  Administered 2014-03-27: 20 mg via INTRAVENOUS
  Filled 2014-03-27 (×2): qty 2

## 2014-03-27 MED ORDER — DEXTROSE 5 % IV SOLN
500.0000 mg | INTRAVENOUS | Status: DC
  Administered 2014-03-27 – 2014-03-29 (×3): 500 mg via INTRAVENOUS
  Filled 2014-03-27 (×4): qty 500

## 2014-03-27 NOTE — Progress Notes (Signed)
Patient's condom catheter leaking around the site.  Patient changed and bed linen changed.  Condom catheter reapplied for strict I&Os monitoring.

## 2014-03-27 NOTE — Progress Notes (Signed)
[facility] PROGRESS NOTE  [name] [name] PXT:[id number] DOB: 1930/10/14 DOA: 03/23/2014 PCP: [name], MD  Summary:  Per Dr. Roderic Palau on 7/16: This patient was admitted to the hospital from [facility] after becoming unresponsive and having acute on chronic respiratory failure.  He has oxygen dependent COPD and has been prescribed Bipap QHS.  It was reported by family that patient had not worn bipap for at least 3 nights prior to admission.  Upon arrival to the hospital, he was restarted on bipap and treated for COPD exacerbation with steroids, nebs and antibiotics.  He has gradually improved and mental status appears to be approaching baseline.  Family is requesting discharge to an alternate SNF facility; apparently he has a bed at the [facility]  Interim: The patient developed mild respiratory distress on 7/17. Followup ABG revealed PCO2 of 81. He was briefly started on BiPAP during the day and the repeat PCO2 improved to 74. Respiratory therapy was asked to decrease oxygen infusion during nebulizer treatments. Followup chest x-ray revealed patchy bilateral lower lobe airspace opacities with a trace of pleural effusions. These opacities are being treated as pneumonia which probably fluffed out with some hydration during hospitalization. Antibiotic therapy was broadened to add azithromycin to Rocephin. Because of the increased bronchospasms, Solu-Medrol was increased slightly to 80 mg every 6 hours. We'll also give a trial of 20 mg of Lasix IV x1 although I do not believe the patient has congestive heart failure. We'll order proBNP for further assessment. The patient will likely need a few more days of hospitalization before being transferred to the [facility].   Assessment/Plan: 1. Acute on chronic respiratory failure with hypercapnia. Followup ABG on 7/17 following mild respiratory distress revealed PCO2 of 81. BiPAP was administered for few hours on the floor and the repeat PCO2 improved  to 74 with a near normal pH. Chest x-ray on 7/17 revealed bibasilar opacities and trace of pleural effusions. These findings are being treated as pneumonia that probably did not fluffed out until after mild hydration in the ED. Antibiotic therapy has been broadened to include IV azithromycin. Solu-Medrol was decreased on 7/16, but with more bronchospasms, it was increased to 80 mg IV every 6 hours. We'll order Lasix IV x1 to see if it will help with bronchospasms/congestion. Also, Atrovent was added to albuterol for broader bronchodilator therapy. Respiratory therapy was asked to infuse room air instead of oxygen during nebulizer treatments. The patient is currently stable, but will require a few more days of hospitalization.  Family reported that patient was not wearing BiPAP the last 3 nights prior to admission. He is chronically on 2 L of oxygen. Initial PCO2 was noted to be in the 90s. Patient was started on BiPAP since admission and quickly started to improve.  Bipap is now QHS, as was recommended as an outpatient.  2. COPD exacerbation. See treatment as above. 3. Metabolic encephalopathy related to CO2 narcosis.  Appears to have improved. He is less lethargic than he was yesterday. 4. Moderate to severe dementia. Stable. 5. Diabetes on sliding scale insulin and metformin. His CBGs are reasonable and generally below 200. With the increase in Solu-Medrol again, bedtime Lantus was added. will add Lantus. Hemoglobin A1c is pending. 6. Hypertension. Blood pressures appear to be stable. Continue amlodipine. 7. Hyperkalemia. Resolved. 8. Anemia, likely of chronic disease. His vitamin B12 level is 1195; mildly low iron of 37, normal TIBC of 275; ferritin is pending. TSH is pending.  Will Hemoccult stools. 9.  Chronic debilitation and physical deconditioning. He will return to a new skilled nursing facility, the [facility], once clinically appropriate for discharge.  Code Status: DO NOT RESUSCITATE Family  Communication: Discussed with family members at the bedside Disposition Plan: Skilled nursing facility placement once respiratory status stabilizes.   Consultants:  Pulmonology, Dr. Luan Pulling  Procedures:  BiPAP  Antibiotics:  Oral azithromycin 7/17>>7/18>> IV azithromycin 7/18  Rocephin 7/14>>  HPI/Subjective: The patient says his breathing is "tight", but better than yesterday. He denies chest pain.   Objective: Filed Vitals:   03/27/14 0551  BP: 146/68  Pulse: 57  Temp: 98.4 F (36.9 C)  Resp: 20   temperature 98.4. Pulse 57. Respiratory rate 24. Pressure 108/73. Oxygen saturation 98 percent on 2 L of oxygen  Intake/Output Summary (Last 24 hours) at 03/27/14 1228 Last data filed at 03/27/14 0900  Gross per 24 hour  Intake    340 ml  Output    750 ml  Net   -410 ml   Filed Weights   03/23/14 1706 03/24/14 0500 03/25/14 0500  Weight: 80.7 kg (177 lb 14.6 oz) 80 kg (176 lb 5.9 oz) 84.1 kg (185 lb 6.5 oz)    Exam:   General:  Patient is very hard of hearing. His eyes are closed, but he answers appropriately.  Cardiovascular: S1, S2, regular rate and rhythm  Respiratory: His breathing is less labored than yesterday. There are scattered expiratory wheezes bilaterally.  Abdomen: Soft, nontender, appears mildly distended, positive bowel sounds  Musculoskeletal: No pedal edema bilaterally   Neurologic: He is very hard of hearing. He is mildly lethargic, but his speech is clear when he answers. He follows small directions.  Data Reviewed: Basic Metabolic Panel:  Recent Labs Lab 03/23/14 1327 03/24/14 0449 03/24/14 1553 03/25/14 0455 03/26/14 0549 03/27/14 0028  NA 144 141  --  144 146 141  K 5.3 6.0* 5.1 3.7 3.9 4.2  CL 96 96  --  96 99 95*  CO2 43* 41*  --  40* 40* 36*  GLUCOSE 94 149*  --  165* 146* 148*  BUN 10 17  --  19 19 22   CREATININE 0.94 0.83  --  0.87 0.93 0.91  CALCIUM 9.9 9.6  --  9.0 9.0 9.2   Liver Function Tests:  Recent Labs Lab  03/24/14 0449  AST 8  ALT 6  ALKPHOS 58  BILITOT <0.2*  PROT 6.7  ALBUMIN 3.0*   No results found for this basename: LIPASE, AMYLASE,  in the last 168 hours No results found for this basename: AMMONIA,  in the last 168 hours CBC:  Recent Labs Lab 03/23/14 1327 03/24/14 0449 03/25/14 0455 03/26/14 0549 03/27/14 0620  WBC 9.7 5.7 5.3 6.1 4.4  HGB 10.4* 9.5* 9.0* 8.8* 9.8*  HCT 37.3* 33.4* 31.1* 30.2* 33.3*  MCV 98.9 97.1 95.4 95.6 94.3  PLT 168 152 152 150 154   Cardiac Enzymes:  Recent Labs Lab 03/23/14 1327  TROPONINI <0.30   BNP (last 3 results)  Recent Labs  02/25/14 0300 03/23/14 1327  PROBNP 64.0 293.3   CBG:  Recent Labs Lab 03/26/14 1151 03/26/14 1714 03/26/14 2211 03/27/14 0823 03/27/14 1142  GLUCAP 167* 140* 146* 143* 172*    No results found for this or any previous visit (from the past 240 hour(s)).   Studies: Dg Chest Port 1 View  03/26/2014   CLINICAL DATA:  COPD  EXAM: PORTABLE CHEST - 1 VIEW  COMPARISON:  03/23/2012  FINDINGS:  Patchy bilateral lower lobe airspace opacities are noted. Trace pleural effusions are visualized. Lung bases are incompletely included in the field of view. Heart size is at the upper limits of normal. No acute osseous finding.  IMPRESSION: Patchy bilateral lobe airspace opacities and trace effusions. Findings are nonspecific and could represent alveolar filling processes such as pneumonia, edema, hemorrhage, or protein. If symptoms persist, consider PA and lateral chest radiographs obtained at full inspiration when the patient is clinically able.   Electronically Signed   By: Conchita Paris M.D.   On: 03/26/2014 11:02    Scheduled Meds: . amLODipine  5 mg Oral Daily  . antiseptic oral rinse  15 mL Mouth Rinse q12n4p  . azithromycin  500 mg Intravenous Q24H  . brimonidine  1 drop Both Eyes TID  . cefTRIAXone (ROCEPHIN)  IV  1 g Intravenous Q24H  . chlorhexidine  15 mL Mouth Rinse BID  . citalopram  10 mg Oral  Daily  . divalproex  500 mg Oral TID  . donepezil  10 mg Oral QHS  . dorzolamide-timolol  1 drop Right Eye BID  . ferrous sulfate  325 mg Oral BID  . heparin  5,000 Units Subcutaneous 3 times per day  . insulin aspart  0-15 Units Subcutaneous TID WC  . insulin aspart  0-5 Units Subcutaneous QHS  . insulin glargine  12 Units Subcutaneous QHS  . ipratropium-albuterol  3 mL Nebulization Q4H  . latanoprost  1 drop Right Eye QHS  . levETIRAcetam  250 mg Oral BID  . loratadine  10 mg Oral Daily  . LORazepam  0.5 mg Oral BID  . LORazepam  1 mg Oral QHS  . metFORMIN  500 mg Oral BID WC  . methylPREDNISolone (SOLU-MEDROL) injection  80 mg Intravenous Q6H  . montelukast  10 mg Oral QHS  . pantoprazole  40 mg Oral Daily  . pilocarpine  1 drop Right Eye QID  . polyethylene glycol  17 g Oral Daily  . senna-docusate  1 tablet Oral BID  . simvastatin  20 mg Oral q1800  . 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

## 2014-03-28 DIAGNOSIS — F411 Generalized anxiety disorder: Secondary | ICD-10-CM

## 2014-03-28 LAB — BASIC METABOLIC PANEL
Anion gap: 8 (ref 5–15)
BUN: 21 mg/dL (ref 6–23)
CHLORIDE: 92 meq/L — AB (ref 96–112)
CO2: 41 meq/L — AB (ref 19–32)
Calcium: 9.2 mg/dL (ref 8.4–10.5)
Creatinine, Ser: 0.87 mg/dL (ref 0.50–1.35)
GFR calc Af Amer: >90 mL/min (ref >90)
GFR calc non Af Amer: 79 mL/min — ABNORMAL LOW (ref >90)
Glucose, Bld: 142 mg/dL — ABNORMAL HIGH (ref 70–99)
POTASSIUM: 3.9 meq/L (ref 3.7–5.3)
Sodium: 141 mEq/L (ref 137–147)

## 2014-03-28 LAB — GLUCOSE, CAPILLARY
GLUCOSE-CAPILLARY: 206 mg/dL — AB (ref 70–99)
GLUCOSE-CAPILLARY: 178 mg/dL — AB (ref 70–99)
Glucose-Capillary: 138 mg/dL — ABNORMAL HIGH (ref 70–99)

## 2014-03-28 MED ORDER — METHYLPREDNISOLONE SODIUM SUCC 125 MG IJ SOLR
60.0000 mg | Freq: Four times a day (QID) | INTRAMUSCULAR | Status: DC
  Administered 2014-03-28 – 2014-03-29 (×4): 60 mg via INTRAVENOUS
  Filled 2014-03-28 (×4): qty 2

## 2014-03-28 NOTE — Progress Notes (Signed)
[facility] PROGRESS NOTE  ACY ORSAK HKV:[id number] DOB: 1931-08-13 DOA: 03/23/2014 PCP: [name], MD  Summary:  Per Dr. Roderic Palau on 7/16: This patient was admitted to the hospital from [facility] after becoming unresponsive and having acute on chronic respiratory failure.  He has oxygen dependent COPD and has been prescribed Bipap QHS.  It was reported by family that patient had not worn bipap for at least 3 nights prior to admission.  Upon arrival to the hospital, he was restarted on bipap and treated for COPD exacerbation with steroids, nebs and antibiotics.  He has gradually improved and mental status appears to be approaching baseline.  Family is requesting discharge to an alternate SNF facility; apparently he has a bed at the [facility].  Interim: The patient developed mild respiratory distress on 7/17. Followup ABG revealed PCO2 of 81. He was briefly started on BiPAP during the day and the repeat PCO2 improved to 74. Respiratory therapy was asked to decrease oxygen infusion during nebulizer treatments. Followup chest x-ray revealed patchy bilateral lower lobe airspace opacities with a trace of pleural effusions. These opacities are being treated as pneumonia which probably fluffed out with some hydration during hospitalization. Antibiotic therapy was broadened to add azithromycin to Rocephin. Because of the increased bronchospasms, Solu-Medrol was increased slightly to 80 mg every 6 hours. We'll also give a trial of 20 mg of Lasix IV x1 although I do not believe the patient has congestive heart failure. We'll order proBNP for further assessment. The patient will likely need a few more days of hospitalization before being transferred to the [facility].   Assessment/Plan: 1. Acute on chronic respiratory failure with hypercapnia. Patient card extremity on 7/17. He had become increasingly somnolent and PCO2 was rising. After several hours of BiPAP therapy, PCO2 decreased to the 70s with  a normal pH. He is now back on nocturnal BiPAP. Family had reported that patient was not wearing BiPAP the last 3 nights prior to admission. He is chronically on 2 L of oxygen. Initial PCO2 on admission was noted to be in the 90s. Patient was started on BiPAP since admission and quickly started to improve.  Bipap is now QHS, as was recommended as an outpatient. We'll start to wean steroids. Continue bronchodilators and antibiotics. 2. Possible pneumonia. Patient is already on Rocephin since admission. His azithromycin was also added to his antibiotic regimen. 3. COPD exacerbation. See treatment as above. 4. Metabolic encephalopathy related to CO2 narcosis.  Appears to have improved. Mental status appears to be approaching baseline. 5. Moderate to severe dementia. Stable. 6. Diabetes on sliding scale insulin and metformin. His CBGs are reasonable. With the increase in Solu-Medrol, bedtime Lantus was added. Hemoglobin A1c is 6.2. Continue to monitor blood sugars 7. Hypertension. Blood pressures appear to be stable. Continue amlodipine. 8. Hyperkalemia. Resolved. 9. Anemia, likely of chronic disease. His vitamin B12 level is 1195; mildly low iron of 37, normal TIBC of 275; ferritin is 118. Stool for occult blood has not been sent yet. 10. Chronic debilitation and physical deconditioning. He will return to a new skilled nursing facility, the [facility], once clinically appropriate for discharge.  Code Status: DO NOT RESUSCITATE Family Communication: Discussed with family members at the bedside Disposition Plan: Skilled nursing facility placement once respiratory status stabilizes.   Consultants:  Pulmonology, Dr. Luan Pulling  Procedures:  BiPAP  Antibiotics:  Oral azithromycin 7/17>>7/18>> IV azithromycin 7/18  Rocephin 7/14>>  HPI/Subjective: He is not coughing. Feels that breathing is doing"ok".  History is somewhat limited since he is profoundly hard of hearing  Objective: Filed Vitals:    03/28/14 1324  BP: 117/85  Pulse: 86  Temp: 98.1 F (36.7 C)  Resp: 20    Intake/Output Summary (Last 24 hours) at 03/28/14 1924 Last data filed at 03/28/14 1848  Gross per 24 hour  Intake      0 ml  Output    900 ml  Net   -900 ml   Filed Weights   03/23/14 1706 03/24/14 0500 03/25/14 0500  Weight: 80.7 kg (177 lb 14.6 oz) 80 kg (176 lb 5.9 oz) 84.1 kg (185 lb 6.5 oz)    Exam:   General:  Patient is very hard of hearing. His eyes are closed, but he answers appropriately.  Cardiovascular: S1, S2, regular rate and rhythm  Respiratory: His breathing is not labored. Lungs are clear with some crackles at bases.  Abdomen: Soft, nontender, appears mildly distended, positive bowel sounds  Musculoskeletal: No pedal edema bilaterally   Neurologic: He is very hard of hearing. No gross deficits  Data Reviewed: Basic Metabolic Panel:  Recent Labs Lab 03/24/14 0449 03/24/14 1553 03/25/14 0455 03/26/14 0549 03/27/14 0028 03/28/14 0558  NA 141  --  144 146 141 141  K 6.0* 5.1 3.7 3.9 4.2 3.9  CL 96  --  96 99 95* 92*  CO2 41*  --  40* 40* 36* 41*  GLUCOSE 149*  --  165* 146* 148* 142*  BUN 17  --  19 19 22 21   CREATININE 0.83  --  0.87 0.93 0.91 0.87  CALCIUM 9.6  --  9.0 9.0 9.2 9.2   Liver Function Tests:  Recent Labs Lab 03/24/14 0449  AST 8  ALT 6  ALKPHOS 58  BILITOT <0.2*  PROT 6.7  ALBUMIN 3.0*   No results found for this basename: LIPASE, AMYLASE,  in the last 168 hours No results found for this basename: AMMONIA,  in the last 168 hours CBC:  Recent Labs Lab 03/23/14 1327 03/24/14 0449 03/25/14 0455 03/26/14 0549 03/27/14 0620  WBC 9.7 5.7 5.3 6.1 4.4  HGB 10.4* 9.5* 9.0* 8.8* 9.8*  HCT 37.3* 33.4* 31.1* 30.2* 33.3*  MCV 98.9 97.1 95.4 95.6 94.3  PLT 168 152 152 150 154   Cardiac Enzymes:  Recent Labs Lab 03/23/14 1327  TROPONINI <0.30   BNP (last 3 results)  Recent Labs  02/25/14 0300 03/23/14 1327 03/27/14 1322  PROBNP  64.0 293.3 530.9*   CBG:  Recent Labs Lab 03/27/14 1142 03/27/14 1636 03/27/14 2301 03/28/14 0743 03/28/14 1106  GLUCAP 172* 164* 131* 138* 206*    No results found for this or any previous visit (from the past 240 hour(s)).   Studies: No results found.  Scheduled Meds: . amLODipine  5 mg Oral Daily  . antiseptic oral rinse  15 mL Mouth Rinse q12n4p  . azithromycin  500 mg Intravenous Q24H  . brimonidine  1 drop Both Eyes TID  . cefTRIAXone (ROCEPHIN)  IV  1 g Intravenous Q24H  . chlorhexidine  15 mL Mouth Rinse BID  . citalopram  10 mg Oral Daily  . divalproex  500 mg Oral TID  . donepezil  10 mg Oral QHS  . dorzolamide-timolol  1 drop Right Eye BID  . ferrous sulfate  325 mg Oral BID  . heparin  5,000 Units Subcutaneous 3 times per day  . insulin aspart  0-15 Units Subcutaneous TID WC  . insulin aspart  0-5 Units Subcutaneous QHS  . insulin glargine  12 Units Subcutaneous QHS  . ipratropium-albuterol  3 mL Nebulization QID  . latanoprost  1 drop Right Eye QHS  . levETIRAcetam  250 mg Oral BID  . loratadine  10 mg Oral Daily  . LORazepam  0.5 mg Oral BID  . LORazepam  1 mg Oral QHS  . metFORMIN  500 mg Oral BID WC  . methylPREDNISolone (SOLU-MEDROL) injection  80 mg Intravenous Q6H  . montelukast  10 mg Oral QHS  . pantoprazole  40 mg Oral Daily  . pilocarpine  1 drop Right Eye QID  . polyethylene glycol  17 g Oral Daily  . senna-docusate  1 tablet Oral BID  . simvastatin  20 mg Oral q1800  . 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

## 2014-03-29 LAB — CBC
HCT: 35.5 % — ABNORMAL LOW (ref 39.0–52.0)
Hemoglobin: 10.3 g/dL — ABNORMAL LOW (ref 13.0–17.0)
MCH: 27.6 pg (ref 26.0–34.0)
MCHC: 29 g/dL — AB (ref 30.0–36.0)
MCV: 95.2 fL (ref 78.0–100.0)
PLATELETS: 181 10*3/uL (ref 150–400)
RBC: 3.73 MIL/uL — ABNORMAL LOW (ref 4.22–5.81)
RDW: 15.9 % — ABNORMAL HIGH (ref 11.5–15.5)
WBC: 6.1 10*3/uL (ref 4.0–10.5)

## 2014-03-29 LAB — BASIC METABOLIC PANEL
ANION GAP: 7 (ref 5–15)
BUN: 25 mg/dL — ABNORMAL HIGH (ref 6–23)
CALCIUM: 9.3 mg/dL (ref 8.4–10.5)
CO2: 43 mEq/L (ref 19–32)
Chloride: 91 mEq/L — ABNORMAL LOW (ref 96–112)
Creatinine, Ser: 0.88 mg/dL (ref 0.50–1.35)
GFR calc Af Amer: >90 mL/min (ref >90)
GFR, EST NON AFRICAN AMERICAN: 79 mL/min — AB (ref >90)
GLUCOSE: 146 mg/dL — AB (ref 70–99)
Potassium: 4.3 mEq/L (ref 3.7–5.3)
SODIUM: 141 meq/L (ref 137–147)

## 2014-03-29 LAB — GLUCOSE, CAPILLARY
GLUCOSE-CAPILLARY: 189 mg/dL — AB (ref 70–99)
GLUCOSE-CAPILLARY: 138 mg/dL — AB (ref 70–99)
Glucose-Capillary: 190 mg/dL — ABNORMAL HIGH (ref 70–99)
Glucose-Capillary: 215 mg/dL — ABNORMAL HIGH (ref 70–99)
Glucose-Capillary: 210 mg/dL — ABNORMAL HIGH (ref 70–99)

## 2014-03-29 MED ORDER — PREDNISONE 20 MG PO TABS
60.0000 mg | ORAL_TABLET | Freq: Every day | ORAL | Status: DC
  Administered 2014-03-30: 60 mg via ORAL
  Filled 2014-03-29: qty 3

## 2014-03-29 NOTE — Progress Notes (Signed)
CRITICAL VALUE ALERT  Critical value received:  CO2 43  Date of notification: 03/29/2014  Time of notification:  0706  Critical value read back:Yes.    Nurse who received alert:  Stann Mainland  MD notified (1st page):  Dr Roderic Palau  Time of first page:  [phone]  MD notified (2nd page):  Time of second page:  Responding MD:    Time MD responded:

## 2014-03-29 NOTE — Progress Notes (Signed)
[facility] PROGRESS NOTE  [name] [name] KZS:[id number] DOB: 03/28/1931 DOA: 03/23/2014 PCP: [name], MD  Summary:   This patient was admitted to the hospital from [facility] after becoming unresponsive and having acute on chronic respiratory failure.  He has oxygen dependent COPD and has been prescribed Bipap QHS.  It was reported by family that patient had not worn bipap for at least 3 nights prior to admission.  Upon arrival to the hospital, he was restarted on bipap and treated for COPD exacerbation with steroids, nebs and antibiotics.  He has gradually improved and mental status appears to be approaching baseline.  Family is requesting discharge to an alternate SNF facility; apparently he has a bed at the [facility].  Interim: The patient developed mild respiratory distress on 7/17. Followup ABG revealed PCO2 of 81. He was briefly started on BiPAP during the day and the repeat PCO2 improved to 74. Respiratory therapy was asked to decrease oxygen infusion during nebulizer treatments. Followup chest x-ray revealed patchy bilateral lower lobe airspace opacities with a trace of pleural effusions. These opacities are being treated as pneumonia which probably fluffed out with some hydration during hospitalization. Antibiotic therapy was broadened to add azithromycin to Rocephin.   Assessment/Plan: 1. Acute on chronic respiratory failure with hypercapnia. Patient developed mild respiratory distress on 7/17. He had become increasingly somnolent and PCO2 was rising. After several hours of BiPAP therapy, PCO2 decreased to the 70s with a normal pH. He is now back on nocturnal BiPAP. Family had reported that patient was not wearing BiPAP the last 3 nights prior to admission. He is chronically on 2 L of oxygen. Initial PCO2 on admission was noted to be in the 90s. Patient was started on BiPAP since admission and quickly started to improve.  Bipap is now QHS, as was recommended as an outpatient.  Since he appears to be clinically improving, will change steroids to prednisone. Continue bronchodilators and antibiotics. I suspect he may be approaching his baseline and would be appropriate for discharge in the next 24-48 hours.  Will discuss with Dr. Luan Pulling. 2. Possible pneumonia. Patient is already on Rocephin since admission. His azithromycin was also added to his antibiotic regimen. Can likely transition to levaquin if remains stable 3. COPD exacerbation. See treatment as above. 4. Metabolic encephalopathy related to CO2 narcosis.  Appears to have improved. Mental status appears to be approaching baseline. 5. Moderate to severe dementia. Stable. 6. Diabetes on sliding scale insulin and metformin. His CBGs are reasonable. With the increase in Solu-Medrol, bedtime Lantus was added. Hemoglobin A1c is 6.2. Continue to monitor blood sugars 7. Hypertension. Blood pressures appear to be stable. Continue amlodipine. 8. Hyperkalemia. Resolved. 9. Anemia, likely of chronic disease. His vitamin B12 level is 1195; mildly low iron of 37, normal TIBC of 275; ferritin is 118. Stool for occult blood has not been sent yet. 10. Chronic debilitation and physical deconditioning. He will return to a new skilled nursing facility, the [facility], once clinically appropriate for discharge.  Code Status: DO NOT RESUSCITATE Family Communication: Discussed with family members at the bedside Disposition Plan: Skilled nursing facility placement once respiratory status stabilizes.   Consultants:  Pulmonology, Dr. Luan Pulling  Procedures:  BiPAP  Antibiotics:  Oral azithromycin 7/17>>7/18>> IV azithromycin 7/18  Rocephin 7/14>>  HPI/Subjective: Says he gets short of breath after carrying on a conversation.  Objective: Filed Vitals:   03/29/14 1448  BP: 131/76  Pulse: 86  Temp: 98.3 F (36.8 C)  Resp:  18    Intake/Output Summary (Last 24 hours) at 03/29/14 2006 Last data filed at 03/29/14 1908   Gross per 24 hour  Intake   1310 ml  Output    825 ml  Net    485 ml   Filed Weights   03/23/14 1706 03/24/14 0500 03/25/14 0500  Weight: 80.7 kg (177 lb 14.6 oz) 80 kg (176 lb 5.9 oz) 84.1 kg (185 lb 6.5 oz)    Exam:   General:  Patient is very hard of hearing. His eyes are closed, but he answers appropriately.  Cardiovascular: S1, S2, regular rate and rhythm  Respiratory: His breathing is not labored. Lungs are diminished but clear  Abdomen: Soft, nontender, appears mildly distended, positive bowel sounds  Musculoskeletal: No pedal edema bilaterally   Neurologic: He is very hard of hearing. No gross deficits  Data Reviewed: Basic Metabolic Panel:  Recent Labs Lab 03/25/14 0455 03/26/14 0549 03/27/14 0028 03/28/14 0558 03/29/14 0545  NA 144 146 141 141 141  K 3.7 3.9 4.2 3.9 4.3  CL 96 99 95* 92* 91*  CO2 40* 40* 36* 41* 43*  GLUCOSE 165* 146* 148* 142* 146*  BUN 19 19 22 21  25*  CREATININE 0.87 0.93 0.91 0.87 0.88  CALCIUM 9.0 9.0 9.2 9.2 9.3   Liver Function Tests:  Recent Labs Lab 03/24/14 0449  AST 8  ALT 6  ALKPHOS 58  BILITOT <0.2*  PROT 6.7  ALBUMIN 3.0*   No results found for this basename: LIPASE, AMYLASE,  in the last 168 hours No results found for this basename: AMMONIA,  in the last 168 hours CBC:  Recent Labs Lab 03/24/14 0449 03/25/14 0455 03/26/14 0549 03/27/14 0620 03/29/14 0545  WBC 5.7 5.3 6.1 4.4 6.1  HGB 9.5* 9.0* 8.8* 9.8* 10.3*  HCT 33.4* 31.1* 30.2* 33.3* 35.5*  MCV 97.1 95.4 95.6 94.3 95.2  PLT 152 152 150 154 181   Cardiac Enzymes:  Recent Labs Lab 03/23/14 1327  TROPONINI <0.30   BNP (last 3 results)  Recent Labs  02/25/14 0300 03/23/14 1327 03/27/14 1322  PROBNP 64.0 293.3 530.9*   CBG:  Recent Labs Lab 03/28/14 1711 03/28/14 2147 03/29/14 0743 03/29/14 1114 03/29/14 1659  GLUCAP 178* 189* 138* 190* 215*    No results found for this or any previous visit (from the past 240 hour(s)).    Studies: No results found.  Scheduled Meds: . amLODipine  5 mg Oral Daily  . antiseptic oral rinse  15 mL Mouth Rinse q12n4p  . azithromycin  500 mg Intravenous Q24H  . brimonidine  1 drop Both Eyes TID  . cefTRIAXone (ROCEPHIN)  IV  1 g Intravenous Q24H  . chlorhexidine  15 mL Mouth Rinse BID  . citalopram  10 mg Oral Daily  . divalproex  500 mg Oral TID  . donepezil  10 mg Oral QHS  . dorzolamide-timolol  1 drop Right Eye BID  . ferrous sulfate  325 mg Oral BID  . heparin  5,000 Units Subcutaneous 3 times per day  . insulin aspart  0-15 Units Subcutaneous TID WC  . insulin aspart  0-5 Units Subcutaneous QHS  . insulin glargine  12 Units Subcutaneous QHS  . ipratropium-albuterol  3 mL Nebulization QID  . latanoprost  1 drop Right Eye QHS  . levETIRAcetam  250 mg Oral BID  . loratadine  10 mg Oral Daily  . LORazepam  0.5 mg Oral BID  . LORazepam  1 mg Oral QHS  .  metFORMIN  500 mg Oral BID WC  . montelukast  10 mg Oral QHS  . pantoprazole  40 mg Oral Daily  . pilocarpine  1 drop Right Eye QID  . polyethylene glycol  17 g Oral Daily  . predniSONE  60 mg Oral Q breakfast  . senna-docusate  1 tablet Oral BID  . simvastatin  20 mg Oral q1800  . 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

## 2014-03-30 ENCOUNTER — Inpatient Hospital Stay
Admission: RE | Admit: 2014-03-30 | Discharge: 2014-07-09 | Disposition: A | Discharge status: Other Acute Inpatient Hospital | Payer: PRIVATE HEALTH INSURANCE | Source: Ambulatory Visit | Attending: Internal Medicine | Admitting: Internal Medicine

## 2014-03-30 DIAGNOSIS — R05 Cough: Principal | ICD-10-CM

## 2014-03-30 DIAGNOSIS — R059 Cough, unspecified: Principal | ICD-10-CM

## 2014-03-30 DIAGNOSIS — I10 Essential (primary) hypertension: Secondary | ICD-10-CM

## 2014-03-30 DIAGNOSIS — R140 Abdominal distension (gaseous): Secondary | ICD-10-CM

## 2014-03-30 LAB — BASIC METABOLIC PANEL
Anion gap: 5 (ref 5–15)
BUN: 26 mg/dL — ABNORMAL HIGH (ref 6–23)
CHLORIDE: 94 meq/L — AB (ref 96–112)
CO2: 42 meq/L — AB (ref 19–32)
Calcium: 9.3 mg/dL (ref 8.4–10.5)
Creatinine, Ser: 0.82 mg/dL (ref 0.50–1.35)
GFR calc Af Amer: >90 mL/min (ref >90)
GFR, EST NON AFRICAN AMERICAN: 81 mL/min — AB (ref >90)
GLUCOSE: 105 mg/dL — AB (ref 70–99)
POTASSIUM: 3.9 meq/L (ref 3.7–5.3)
SODIUM: 141 meq/L (ref 137–147)

## 2014-03-30 LAB — CBC
HCT: 34.8 % — ABNORMAL LOW (ref 39.0–52.0)
HEMOGLOBIN: 10.1 g/dL — AB (ref 13.0–17.0)
MCH: 27 pg (ref 26.0–34.0)
MCHC: 29 g/dL — ABNORMAL LOW (ref 30.0–36.0)
MCV: 93 fL (ref 78.0–100.0)
PLATELETS: 175 10*3/uL (ref 150–400)
RBC: 3.74 MIL/uL — ABNORMAL LOW (ref 4.22–5.81)
RDW: 16.3 % — ABNORMAL HIGH (ref 11.5–15.5)
WBC: 5.7 10*3/uL (ref 4.0–10.5)

## 2014-03-30 LAB — GLUCOSE, CAPILLARY
Glucose-Capillary: 105 mg/dL — ABNORMAL HIGH (ref 70–99)
Glucose-Capillary: 142 mg/dL — ABNORMAL HIGH (ref 70–99)
Glucose-Capillary: 153 mg/dL — ABNORMAL HIGH (ref 70–99)

## 2014-03-30 MED ORDER — INSULIN GLARGINE 100 UNIT/ML ~~LOC~~ SOLN: 12.0000 [IU] | Freq: Every day | SUBCUTANEOUS | Status: DC

## 2014-03-30 MED ORDER — PREDNISONE 20 MG PO TABS: ORAL_TABLET | ORAL | Status: DC

## 2014-03-30 MED ORDER — LEVOFLOXACIN 750 MG PO TABS: 750.0000 mg | ORAL_TABLET | Freq: Every day | ORAL | Status: DC

## 2014-03-30 MED ORDER — IPRATROPIUM-ALBUTEROL 0.5-2.5 (3) MG/3ML IN SOLN: 3.0000 mL | Freq: Four times a day (QID) | RESPIRATORY_TRACT | Status: AC

## 2014-03-30 NOTE — Progress Notes (Signed)
I was asked to see him again by Dr. Roderic Palau. The question is whether he is back to baseline in potentially ready for transfer back to a skilled care facility.  Exam today shows he looks comfortable. He is very hard of hearing so communication is difficult. His chest is clear. He is blind. His heart is regular. He does not have any edema.  I think he is back essentially to baseline. At baseline he has severe COPD with chronic respiratory failure. He probably has some pneumonia which is being treated. I think it is okay for him to be transferred to skilled care facility at this point

## 2014-03-30 NOTE — Progress Notes (Signed)
Pt wears 16/8 BIPAP with 2L of oxygen bled in at [facility]

## 2014-03-30 NOTE — Discharge Summary (Signed)
Physician Discharge Summary  [name] [name] IRJ:[id number] DOB: 30-Dec-1930 DOA: 03/23/2014  PCP: [name], MD  Admit date: 03/23/2014 Discharge date: 03/30/2014  Time spent: 40 minutes  Recommendations for Outpatient Follow-up:  1. Patient will be discharged the [facility] 2. BiPAP each bedtime, 16/8 with 2 L of oxygen 3. He has been started on Lantus for elevated blood sugars due to steroids. This should be reassessed once patient's steroids have been weaned off. 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   Acute on Chronic respiratory failure with hypercapnea   Anemia   DM type 2 (diabetes mellitus, type 2)   COPD with acute exacerbation   Hyperkalemia   Encephalopathy, metabolic   Anemia of chronic disease   PNA (pneumonia)   Pleural effusion   Discharge Condition: stable  Diet recommendation: low salt, low carb  Filed Weights   03/23/14 1706 03/24/14 0500 03/25/14 0500  Weight: 80.7 kg (177 lb 14.6 oz) 80 kg (176 lb 5.9 oz) 84.1 kg (185 lb 6.5 oz)    History of present illness:  This patient was admitted to the hospital after he was noted to have worsening mental status/unresponsive and acute on chronic respiratory failure. He has known severe COPD and is on each bedtime BiPAP. According to the patient's family, patient had not worn his BiPAP at least 3 nights prior to admission. On admission, he was noted to have hypercapnia and acute on chronic respiratory failure requiring admission to the step down unit.  Hospital Course:  This gentleman has known severe COPD and is on each bedtime BiPAP. He was admitted with acute on chronic respiratory failure and hypercapnia with associated metabolic encephalopathy. He was difficult to arouse and PCO2 was noted to be greater than 90. It appears that his baseline PCO2 runs in the 70s. He was admitted to the step down unit,  started on BiPAP, intravenous steroids, antibiotics and bronchodilators. He was seen by pulmonology in consultation. With conservative management, his mental status improved back to baseline. At baseline, he is very hard of hearing and can only hear from his left ear. He is also blind. He has been continued on intravenous steroids and antibiotics. Chest x-ray indicated possible pneumonia which is being treated for appropriately. He has been afebrile and does not have any significant leukocytosis. It appears that he is pushing his functional baseline. He'll be continued on BiPAP each bedtime. Steroids have been changed to prednisone and he appears to be tolerating this. We will start a slow taper of prednisone. He'll complete a total of 7 days of antibiotics. We'll continue bronchodilators. He has been accepted at the [facility] facility. We will plan on discharge later today.  Procedures:  Bipap 16/8 with 2L oxygen  Consultations:  Pulmonology, Dr. Luan Pulling  Discharge Exam: Filed Vitals:   03/30/14 0525  BP: 126/80  Pulse: 88  Temp: 98.8 F (37.1 C)  Resp: 18    General: NAD, extremely hard of hearing, blind Cardiovascular: S1, S2 RRR Respiratory: diminished breath sounds but clear  Discharge Instructions You were cared for by a hospitalist during your hospital stay. If you have any questions about your discharge medications or the care you received while you were in the hospital after you are discharged, you can call the unit and asked to speak with the hospitalist on call if the hospitalist that took care of you is not available. Once  you are discharged, your primary care physician will handle any further medical issues. Please note that NO REFILLS for any discharge medications will be authorized once you are discharged, as it is imperative that you return to your primary care physician (or establish a relationship with a primary care physician if you do not have one) for your aftercare  needs so that they can reassess your need for medications and monitor your lab values.  Discharge Instructions   Diet - low sodium heart healthy    Complete by:  As directed      Diet Carb Modified    Complete by:  As directed      Increase activity slowly    Complete by:  As directed             Medication List    STOP taking these medications       albuterol (2.5 MG/3ML) 0.083% nebulizer solution  Commonly known as:  PROVENTIL     carbamide peroxide 6.5 % otic solution  Commonly known as:  DEBROX     SOLU-MEDROL 125 mg/2 mL injection  Generic drug:  methylPREDNISolone sodium succinate      TAKE these medications       acetaminophen 650 MG CR tablet  Commonly known as:  TYLENOL  Take 650 mg by mouth 2 (two) times daily.     ALPHAGAN P 0.1 % Soln  Generic drug:  brimonidine  Place 1 drop into both eyes 2 (two) times daily.     amLODipine 5 MG tablet  Commonly known as:  NORVASC  Take 1 tablet (5 mg total) by mouth daily.     antiseptic oral rinse Liqd  15 mLs by Mouth Rinse route 2 times daily at 12 noon and 4 pm.     citalopram 10 MG tablet  Commonly known as:  CELEXA  Take 10 mg by mouth daily.     divalproex 125 MG capsule  Commonly known as:  DEPAKOTE SPRINKLE  Take 500 mg by mouth 3 (three) times daily.     DOC-Q-LAX 8.6-50 MG per tablet  Generic drug:  senna-docusate  Take 1 tablet by mouth 2 (two) times daily.     donepezil 10 MG tablet  Commonly known as:  ARICEPT  Take 10 mg by mouth at bedtime.     Dorzolamide HCl-Timolol Mal PF 22.3-6.8 MG/ML Soln  Place 1 drop into the right eye 2 (two) times daily.     ferrous sulfate 325 (65 FE) MG EC tablet  Take 325 mg by mouth 2 (two) times daily.     insulin glargine 100 UNIT/ML injection  Commonly known as:  LANTUS  Inject 0.12 mLs (12 Units total) into the skin at bedtime.     ipratropium-albuterol 0.5-2.5 (3) MG/3ML Soln  Commonly known as:  DUONEB  Take 3 mLs by nebulization 4 (four) times  daily.     latanoprost 0.005 % ophthalmic solution  Commonly known as:  XALATAN  Place 1 drop into the right eye at bedtime.     levETIRAcetam 250 MG tablet  Commonly known as:  KEPPRA  Take 250 mg by mouth 2 (two) times daily.     levofloxacin 750 MG tablet  Commonly known as:  LEVAQUIN  Take 1 tablet (750 mg total) by mouth daily.     loratadine 10 MG tablet  Commonly known as:  CLARITIN  Take 10 mg by mouth daily.     LORazepam 1 MG tablet  Commonly known  as:  ATIVAN  Take 1 mg by mouth at bedtime.     LORazepam 0.5 MG tablet  Commonly known as:  ATIVAN  Take 1 tablet (0.5 mg total) by mouth 2 (two) times daily.     lovastatin 40 MG tablet  Commonly known as:  MEVACOR  Take 40 mg by mouth at bedtime.     metFORMIN 500 MG tablet  Commonly known as:  GLUCOPHAGE  Take 500 mg by mouth 2 (two) times daily with a meal.     omeprazole 20 MG capsule  Commonly known as:  PRILOSEC  Take 20 mg by mouth daily.     pilocarpine 1 % ophthalmic solution  Commonly known as:  PILOCAR  Place 1 drop into the right eye 4 (four) times daily.     polyethylene glycol packet  Commonly known as:  MIRALAX / GLYCOLAX  Take 17 g by mouth daily.     predniSONE 20 MG tablet  Commonly known as:  DELTASONE  Take 60mg  po daily for 2 days then 50mg  po daily for 3 days then 40mg  po daily for 3 days then 30mg  po daily for 3 days then 20mg  po daily for 3 days then 10mg  po daily for 3 days then stop     montelukast 10 MG tablet  Commonly known as:  SINGULAIR  Take 10 mg by mouth at bedtime.     SINGULAIR 10 MG tablet  Generic drug:  montelukast  Take 10 mg by mouth every morning.     vitamin C 500 MG tablet  Commonly known as:  ASCORBIC ACID  Take 500 mg by mouth 2 (two) times daily.       No Known Allergies    The results of significant diagnostics from this hospitalization (including imaging, microbiology, ancillary and laboratory) are listed below for reference.    Significant  Diagnostic Studies: Dg Chest Port 1 View  03/26/2014   CLINICAL DATA:  COPD  EXAM: PORTABLE CHEST - 1 VIEW  COMPARISON:  03/23/2012  FINDINGS: Patchy bilateral lower lobe airspace opacities are noted. Trace pleural effusions are visualized. Lung bases are incompletely included in the field of view. Heart size is at the upper limits of normal. No acute osseous finding.  IMPRESSION: Patchy bilateral lobe airspace opacities and trace effusions. Findings are nonspecific and could represent alveolar filling processes such as pneumonia, edema, hemorrhage, or protein. If symptoms persist, consider PA and lateral chest radiographs obtained at full inspiration when the patient is clinically able.   Electronically Signed   By: Conchita Paris M.D.   On: 03/26/2014 11:02   Dg Chest Portable 1 View  03/23/2014   CLINICAL DATA:  Respiratory distress and COPD.  EXAM: PORTABLE CHEST - 1 VIEW  COMPARISON:  02/26/2014  FINDINGS: Stable underlying severe emphysematous lung disease. Mild bibasilar scarring/ atelectasis present. There is no evidence of pulmonary edema, consolidation, pneumothorax, nodule or pleural fluid. The heart size is normal. The aorta shows stable tortuosity.  IMPRESSION: Stable COPD.  Mild bibasilar scarring/ atelectasis.   Electronically Signed   By: Aletta Edouard M.D.   On: 03/23/2014 13:14    Microbiology: No results found for this or any previous visit (from the past 240 hour(s)).   Labs: Basic Metabolic Panel:  Recent Labs Lab 03/26/14 0549 03/27/14 0028 03/28/14 0558 03/29/14 0545 03/30/14 0601  NA 146 141 141 141 141  K 3.9 4.2 3.9 4.3 3.9  CL 99 95* 92* 91* 94*  CO2 40* 36* 41*  43* 42*  GLUCOSE 146* 148* 142* 146* 105*  BUN 19 22 21  25* 26*  CREATININE 0.93 0.91 0.87 0.88 0.82  CALCIUM 9.0 9.2 9.2 9.3 9.3   Liver Function Tests:  Recent Labs Lab 03/24/14 0449  AST 8  ALT 6  ALKPHOS 58  BILITOT <0.2*  PROT 6.7  ALBUMIN 3.0*   No results found for this basename:  LIPASE, AMYLASE,  in the last 168 hours No results found for this basename: AMMONIA,  in the last 168 hours CBC:  Recent Labs Lab 03/25/14 0455 03/26/14 0549 03/27/14 0620 03/29/14 0545 03/30/14 0601  WBC 5.3 6.1 4.4 6.1 5.7  HGB 9.0* 8.8* 9.8* 10.3* 10.1*  HCT 31.1* 30.2* 33.3* 35.5* 34.8*  MCV 95.4 95.6 94.3 95.2 93.0  PLT 152 150 154 181 175   Cardiac Enzymes: No results found for this basename: CKTOTAL, CKMB, CKMBINDEX, TROPONINI,  in the last 168 hours BNP: BNP (last 3 results)  Recent Labs  02/25/14 0300 03/23/14 1327 03/27/14 1322  PROBNP 64.0 293.3 530.9*   CBG:  Recent Labs Lab 03/29/14 1114 03/29/14 1659 03/29/14 2119 03/30/14 0810 03/30/14 1229  GLUCAP 190* 215* 210* 105* 153*       Signed:  Phat Dalton  [facility] 03/30/2014, 2:05 PM

## 2014-03-30 NOTE — Clinical Social Work Note (Signed)
Patient ready for discharge today, will transfer to [facility] staff via tunnel.  FL2 reviewed w RN and updated as needed, discharge summary faxed to facility via [facility].  Discharge packet prepared and placed w shadow chart for transport.  [facility] notified by phone.  Patient, family and facility agreeable to transfer today.  CSW signing off as no further SW needs identified.  Edwyna Shell, LCSW Clinical Social Worker [phone])

## 2014-03-30 NOTE — Clinical Social Work Note (Signed)
Patient ready for discharge today, patient will transfer to [facility] staff via tunnel.  FL2 reviewed w RN and updated as needed.  Discharge summary faxed via [facility].  Discharge packet prepared and placed w shadow chart for transport.  Daughter Rosendo Gros notified by phone and son in room notified and agreeable.  Patient aware he is transferring to [facility] at discharge.  CSW signing off as no further SW needs identified.  Edwyna Shell, LCSW Clinical Social Worker [phone])

## 2014-03-30 NOTE — Progress Notes (Addendum)
Report called to Verlon Au at the [facility]. Patient ready for discharge. IV out and telemetry monitor removed. Family at bedside.

## 2014-03-31 ENCOUNTER — Non-Acute Institutional Stay (SKILLED_NURSING_FACILITY): Payer: PRIVATE HEALTH INSURANCE | Admitting: Internal Medicine

## 2014-03-31 ENCOUNTER — Other Ambulatory Visit: Payer: Self-pay | Admitting: *Deleted

## 2014-03-31 DIAGNOSIS — F02818 Dementia in other diseases classified elsewhere, unspecified severity, with other behavioral disturbance: Secondary | ICD-10-CM

## 2014-03-31 DIAGNOSIS — J189 Pneumonia, unspecified organism: Secondary | ICD-10-CM

## 2014-03-31 DIAGNOSIS — F0281 Dementia in other diseases classified elsewhere with behavioral disturbance: Secondary | ICD-10-CM

## 2014-03-31 DIAGNOSIS — J9620 Acute and chronic respiratory failure, unspecified whether with hypoxia or hypercapnia: Secondary | ICD-10-CM

## 2014-03-31 DIAGNOSIS — J9622 Acute and chronic respiratory failure with hypercapnia: Secondary | ICD-10-CM

## 2014-03-31 DIAGNOSIS — IMO0001 Reserved for inherently not codable concepts without codable children: Secondary | ICD-10-CM

## 2014-03-31 DIAGNOSIS — E1165 Type 2 diabetes mellitus with hyperglycemia: Secondary | ICD-10-CM

## 2014-03-31 LAB — GLUCOSE, CAPILLARY
GLUCOSE-CAPILLARY: 207 mg/dL — AB (ref 70–99)
Glucose-Capillary: 187 mg/dL — ABNORMAL HIGH (ref 70–99)
Glucose-Capillary: 114 mg/dL — ABNORMAL HIGH (ref 70–99)
Glucose-Capillary: 92 mg/dL (ref 70–99)
Glucose-Capillary: 114 mg/dL — ABNORMAL HIGH (ref 70–99)

## 2014-03-31 MED ORDER — LORAZEPAM 0.5 MG PO TABS: ORAL_TABLET | ORAL | Status: DC

## 2014-03-31 MED ORDER — LORAZEPAM 1 MG PO TABS
1.0000 mg | ORAL_TABLET | Freq: Every day | ORAL | Status: DC
Start: 2014-03-31 — End: 2014-07-09

## 2014-03-31 NOTE — Telephone Encounter (Signed)
[facility] 

## 2014-03-31 NOTE — Progress Notes (Signed)
Patient ID: [name] [name], male   DOB: [birth year], 80 y.o.   MRN: [id number]  Facility; [facility] Chief complaint; admission to SNF post admit to [facility] from 7/14 to 7/21  History ; this is an 80 year old man who I believe was an ongoing resident at [facility] facility. He was admitted this time with worsening mental status and unresponsiveness and acute on chronic respiratory failure. He has severe COPD and is on nocturnal BiPAP but. He was admitted with significant hypercapnia and associated metabolic encephalopathy. It appears that his baseline PCO2 runs in the 70s. He was admitted to the step down unit started on BiPAP, intravenous steroids antibiotics and bronchodilators. Chest x-ray suggested pneumonia. He was treated with 7 days of antibiotics.  The patient has a lot of other significant comorbidities including blindness, deafness and dementia with a history of depression and delusions.  He is on BiPAP 16/8 with 2 L of oxygen  It appears he was started on Lantus perhaps just while he was on steroids. I'll need to try and verify this through [place] going to.  Past Medical History  Diagnosis Date  . Glaucoma   . Anxiety   . GERD (gastroesophageal reflux disease)   . Hyperlipidemia   . Hypertension   . Chronic abdominal pain   . 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 Chronic respiratory failure   . Altered mental status 06/12/2012; 08/14/2012    Unwitnessed isolated seizure suspected, but because of the EEG being normal, Dr. Merlene Laughter did not recommend antiseizure therapy.; "violent since 08/13/2012" (08/14/2012)  . Hard of hearing   . Seizure 06/14/2012    Isolated seizure suspected, but not confirmed.  . Bradycardia   . Complication of anesthesia 2005    "couldn't get him woke up when he should have after eye surgery" (08/14/2012)  . Mesothelioma   . Pleural plaque 04/2012  . Exertional dyspnea   . DM type 2 (diabetes mellitus, type 2) 06/13/2012  .  UTI (lower urinary tract infection)    Past Surgical History  Procedure Laterality Date  . Inguinal hernia repair    . Prostate surgery    . Left eye surgery for glaucoma at [facility]  10/2009  . Cataract extraction w/ intraocular lens implant  2005    "left" (08/14/2012)    Current Outpatient Prescriptions on File Prior to Visit  Medication Sig Dispense Refill  . acetaminophen (TYLENOL) 650 MG CR tablet Take 650 mg by mouth 2 (two) times daily.      Marland Kitchen amLODipine (NORVASC) 5 MG tablet Take 1 tablet (5 mg total) by mouth daily.  30 tablet  0  . antiseptic oral rinse (BIOTENE) LIQD 15 mLs by Mouth Rinse route 2 times daily at 12 noon and 4 pm.      . brimonidine (ALPHAGAN P) 0.1 % SOLN Place 1 drop into both eyes 2 (two) times daily.       . citalopram (CELEXA) 10 MG tablet Take 10 mg by mouth daily.      . divalproex (DEPAKOTE SPRINKLE) 125 MG capsule Take 500 mg by mouth 3 (three) times daily.      Marland Kitchen donepezil (ARICEPT) 10 MG tablet Take 10 mg by mouth at bedtime.      . Dorzolamide HCl-Timolol Mal PF 22.3-6.8 MG/ML SOLN Place 1 drop into the right eye 2 (two) times daily.       . ferrous sulfate 325 (65 FE) MG EC tablet Take 325 mg by mouth 2 (  two) times daily.      . insulin glargine (LANTUS) 100 UNIT/ML injection Inject 0.12 mLs (12 Units total) into the skin at bedtime.  10 mL  11  . ipratropium-albuterol (DUONEB) 0.5-2.5 (3) MG/3ML SOLN Take 3 mLs by nebulization 4 (four) times daily.  360 mL    . latanoprost (XALATAN) 0.005 % ophthalmic solution Place 1 drop into the right eye at bedtime.       . levETIRAcetam (KEPPRA) 250 MG tablet Take 250 mg by mouth 2 (two) times daily.      Marland Kitchen levofloxacin (LEVAQUIN) 750 MG tablet Take 1 tablet (750 mg total) by mouth daily.  2 tablet  0  . loratadine (CLARITIN) 10 MG tablet Take 10 mg by mouth daily.      Marland Kitchen LORazepam (ATIVAN) 0.5 MG tablet Take 1 tablet (0.5 mg total) by mouth 2 (two) times daily.  10 tablet  0  . LORazepam (ATIVAN) 1 MG tablet  Take 1 mg by mouth at bedtime.      . lovastatin (MEVACOR) 40 MG tablet Take 40 mg by mouth at bedtime.        . metFORMIN (GLUCOPHAGE) 500 MG tablet Take 500 mg by mouth 2 (two) times daily with a meal.      . montelukast (SINGULAIR) 10 MG tablet Take 10 mg by mouth every morning.       . montelukast (SINGULAIR) 10 MG tablet Take 10 mg by mouth at bedtime.      Marland Kitchen omeprazole (PRILOSEC) 20 MG capsule Take 20 mg by mouth daily.      . pilocarpine (PILOCAR) 1 % ophthalmic solution Place 1 drop into the right eye 4 (four) times daily.       . polyethylene glycol (MIRALAX / GLYCOLAX) packet Take 17 g by mouth daily.      . predniSONE (DELTASONE) 20 MG tablet Take 60mg  po daily for 2 days then 50mg  po daily for 3 days then 40mg  po daily for 3 days then 30mg  po daily for 3 days then 20mg  po daily for 3 days then 10mg  po daily for 3 days then stop      . senna-docusate (DOC-Q-LAX) 8.6-50 MG per tablet Take 1 tablet by mouth 2 (two) times daily.      . vitamin C (ASCORBIC ACID) 500 MG tablet Take 500 mg by mouth 2 (two) times daily.      . [DISCONTINUED] clonazePAM (KLONOPIN) 0.5 MG tablet Take 1 tablet (0.5 mg total) by mouth at bedtime.  30 tablet  0       Social history; I suspect this is a long-standing nursing home resident. There is no advanced directives History   Social History  . Marital Status: Widowed    Spouse Name: N/A    Number of Children: 8  . Years of Education: N/A   Occupational History  . diasable     Social History  Topics  . Smoking status: Former Smoker -- 2.00 packs/day for 40 years    Types: Cigarettes  . Smokeless tobacco: Never Used     Comment: "quit smoking cigarettes ~ 2001"  . Alcohol Use: Yes     Comment: 08/14/2012 "used to drink heavily; stopped ~ 2001"  . Drug Use: No  . Sexual Activity: Not on file   Other Topics Concern  . Not on file   Social History Narrative  . No narrative on file   indicated that his mother is deceased. He indicated that his  father  is deceased. He indicated that his sister is deceased. He indicated that both of his brothers are deceased.    Review of Systems; in general, this is not possible predominantly due to hearing loss. Even with his hearing aid in his right ear he munication is very difficult.  Physical examination Gen. patient is obviously tachypneic and using accessory muscles Vitals; blood pressure 126/68 respirations 26 pulse rate 82 temperature 98.6 O2 sat is 95% on 3 L HEENT; the patient has a hearing aid in the right ear still communication is difficult to. He has bilateral blindness. Oral exam is essentially normal. He has some parotid swelling on the right although no tenderness Respiratory; obvious accessory muscle use respiratory rate 28 markedly decreased air entry bilaterally with prolonged expiratory phase and end-expiratory wheezing Cardiac heart sounds are inaudible. JVP is not elevated he has no peripheral edema Abdomen; distended absent bowel sounds. There is no shifting dullness GU condom catheter in place. Bladder is not distended there is no CVA tenderness Neurologic; with a lot of work we are able to get the patient to lift his arms and legs there does not seem to be any lateralizing weakness. His reflexes are present at the knee jerks bilaterally and equal. Babinski response is flexor Mental status; I am simply not able to do any meaningful exam. He was not overtly depressed  Impression/plan #1 acute on chronic respiratory failure with elevated PCO2 and probably CO2 narcosis. He is elevated total CO2 on his metabolic panel suggestive of chronic response to his elevated PCO2. He is on BiPAP at night. He is on a tapering dose of prednisone he is on albuterol nebulizers 4 times a day will change these to duo nebs. #2 pneumonia on Levaquin. #3 type 2 diabetes he is now on Lantus. There is some tone that the Lantus was new. His blood sugar this morning was 114 this won't need to be carefully  monitored. He is also on metformin. In fact a blood sugar currently of 114 fasting might suggest we're close to needing to reduce this. Soon #4 blindness and profound deafness makes communication with this man very difficult #5 history of refusals of his nocturnal BiPAP the period of time but apparently the family wishes to be called if this is a refusal #6 has a condom catheter which I have no difficulty with that night however I would like this off during the day #7 anemia of chronic disease #8 ileus and/or impaction; most likely an ileus #9 dementia not able to really evaluate

## 2014-04-01 ENCOUNTER — Telehealth: Payer: Self-pay | Admitting: Internal Medicine

## 2014-04-01 LAB — GLUCOSE, CAPILLARY
GLUCOSE-CAPILLARY: 70 mg/dL (ref 70–99)
GLUCOSE-CAPILLARY: 79 mg/dL (ref 70–99)

## 2014-04-01 NOTE — Telephone Encounter (Signed)
Erroneous encounter

## 2014-04-02 LAB — GLUCOSE, CAPILLARY
GLUCOSE-CAPILLARY: 200 mg/dL — AB (ref 70–99)
Glucose-Capillary: 144 mg/dL — ABNORMAL HIGH (ref 70–99)
Glucose-Capillary: 294 mg/dL — ABNORMAL HIGH (ref 70–99)
Glucose-Capillary: 92 mg/dL (ref 70–99)

## 2014-04-03 LAB — GLUCOSE, CAPILLARY
GLUCOSE-CAPILLARY: 87 mg/dL (ref 70–99)
GLUCOSE-CAPILLARY: 116 mg/dL — AB (ref 70–99)
GLUCOSE-CAPILLARY: 184 mg/dL — AB (ref 70–99)

## 2014-04-04 LAB — GLUCOSE, CAPILLARY
GLUCOSE-CAPILLARY: 240 mg/dL — AB (ref 70–99)
GLUCOSE-CAPILLARY: 169 mg/dL — AB (ref 70–99)
Glucose-Capillary: 94 mg/dL (ref 70–99)
Glucose-Capillary: 128 mg/dL — ABNORMAL HIGH (ref 70–99)
Glucose-Capillary: 177 mg/dL — ABNORMAL HIGH (ref 70–99)

## 2014-04-05 ENCOUNTER — Telehealth: Payer: Self-pay | Admitting: Internal Medicine

## 2014-04-05 ENCOUNTER — Non-Acute Institutional Stay (SKILLED_NURSING_FACILITY): Payer: PRIVATE HEALTH INSURANCE | Admitting: Internal Medicine

## 2014-04-05 DIAGNOSIS — J189 Pneumonia, unspecified organism: Secondary | ICD-10-CM

## 2014-04-05 DIAGNOSIS — J9620 Acute and chronic respiratory failure, unspecified whether with hypoxia or hypercapnia: Secondary | ICD-10-CM

## 2014-04-05 DIAGNOSIS — J9622 Acute and chronic respiratory failure with hypercapnia: Secondary | ICD-10-CM

## 2014-04-05 DIAGNOSIS — J441 Chronic obstructive pulmonary disease with (acute) exacerbation: Secondary | ICD-10-CM

## 2014-04-05 LAB — GLUCOSE, CAPILLARY
GLUCOSE-CAPILLARY: 126 mg/dL — AB (ref 70–99)
Glucose-Capillary: 89 mg/dL (ref 70–99)
Glucose-Capillary: 182 mg/dL — ABNORMAL HIGH (ref 70–99)

## 2014-04-05 NOTE — Telephone Encounter (Deleted)
noted 

## 2014-04-05 NOTE — Progress Notes (Signed)
Patient ID: [name] [name], male   DOB: 01-28-1931, 80 y.o.   MRN: [id number] Facility; [facility] Chief complaint; followup COPD History; [name] [name] is a gentleman I admitted to the facility on 7/22 he has severe COPD with the CO2 narcosis he was treated for pneumonia requiring BiPAP at night to. When I saw him to 5 days ago he had marked accessory muscle use with a respiratory rate of 28. He had uncoordinated movements between his abdomen and chest. This raised some degree of concern that I put him on duo nebs 4 times a day routinely. Lab work from today shows a white count of 8.5 hemoglobin 10.1 platelet count 098,119 basic metabolic panel shows a sodium of 142 potassium of 4.4 total CO2 of 39 total [which is an improvement for him] his BUN is 22 creatinine 1.12  Review of systems Respiratory limited by his hearing loss however he is not complaining of cough or shortness of breath. Cardiac; no complaints of chest pain Abdomen no complaints of GI pain GU no dysuria  Physical examination Respiratory; markedly reduced air entry bilaterally however this is improved vs. 5 days ago. Also he has no obvious accessory muscle use and the uncoordinated movements of his abdomen and chest are improved. There is no wheezing and his work of breathing appears to be better Cardiac heart sounds are very distant however his JVP is not elevated there is no evidence of CHF Abdomen; very distended however bowel sounds are very active there is no tenderness and no masses Rectal exam moderate amount of soft stool GU bladder is not distended his prostate exam is remarkably normal  Impressions #1 acute on chronic COPD this seems a lot better on the routine nebulizer therapy which I am probably going to continue. #2 severe but compensated chronic respiratory acidosis #3 probable fecal impaction. I'm going to use regular acidosis he is not already on them.  This patient appears better I am pleased  to see how he is doing

## 2014-04-06 LAB — GLUCOSE, CAPILLARY
GLUCOSE-CAPILLARY: 161 mg/dL — AB (ref 70–99)
GLUCOSE-CAPILLARY: 98 mg/dL (ref 70–99)
Glucose-Capillary: 128 mg/dL — ABNORMAL HIGH (ref 70–99)

## 2014-04-07 LAB — GLUCOSE, CAPILLARY
GLUCOSE-CAPILLARY: 252 mg/dL — AB (ref 70–99)
Glucose-Capillary: 233 mg/dL — ABNORMAL HIGH (ref 70–99)
Glucose-Capillary: 67 mg/dL — ABNORMAL LOW (ref 70–99)
Glucose-Capillary: 73 mg/dL (ref 70–99)
Glucose-Capillary: 146 mg/dL — ABNORMAL HIGH (ref 70–99)
Glucose-Capillary: 253 mg/dL — ABNORMAL HIGH (ref 70–99)

## 2014-04-08 LAB — GLUCOSE, CAPILLARY
Glucose-Capillary: 111 mg/dL — ABNORMAL HIGH (ref 70–99)
Glucose-Capillary: 147 mg/dL — ABNORMAL HIGH (ref 70–99)
Glucose-Capillary: 183 mg/dL — ABNORMAL HIGH (ref 70–99)

## 2014-04-09 LAB — GLUCOSE, CAPILLARY
GLUCOSE-CAPILLARY: 89 mg/dL (ref 70–99)
GLUCOSE-CAPILLARY: 97 mg/dL (ref 70–99)
GLUCOSE-CAPILLARY: 115 mg/dL — AB (ref 70–99)
Glucose-Capillary: 137 mg/dL — ABNORMAL HIGH (ref 70–99)

## 2014-04-10 LAB — GLUCOSE, CAPILLARY
Glucose-Capillary: 61 mg/dL — ABNORMAL LOW (ref 70–99)
Glucose-Capillary: 87 mg/dL (ref 70–99)
Glucose-Capillary: 113 mg/dL — ABNORMAL HIGH (ref 70–99)
Glucose-Capillary: 167 mg/dL — ABNORMAL HIGH (ref 70–99)

## 2014-04-11 LAB — GLUCOSE, CAPILLARY
GLUCOSE-CAPILLARY: 69 mg/dL — AB (ref 70–99)
GLUCOSE-CAPILLARY: 69 mg/dL — AB (ref 70–99)
GLUCOSE-CAPILLARY: 72 mg/dL (ref 70–99)
Glucose-Capillary: 116 mg/dL — ABNORMAL HIGH (ref 70–99)

## 2014-04-12 LAB — GLUCOSE, CAPILLARY
GLUCOSE-CAPILLARY: 107 mg/dL — AB (ref 70–99)
Glucose-Capillary: 183 mg/dL — ABNORMAL HIGH (ref 70–99)
Glucose-Capillary: 172 mg/dL — ABNORMAL HIGH (ref 70–99)

## 2014-04-13 LAB — GLUCOSE, CAPILLARY
GLUCOSE-CAPILLARY: 123 mg/dL — AB (ref 70–99)
GLUCOSE-CAPILLARY: 123 mg/dL — AB (ref 70–99)
GLUCOSE-CAPILLARY: 162 mg/dL — AB (ref 70–99)
Glucose-Capillary: 122 mg/dL — ABNORMAL HIGH (ref 70–99)
Glucose-Capillary: 88 mg/dL (ref 70–99)
Glucose-Capillary: 127 mg/dL — ABNORMAL HIGH (ref 70–99)

## 2014-04-14 LAB — GLUCOSE, CAPILLARY
GLUCOSE-CAPILLARY: 91 mg/dL (ref 70–99)
Glucose-Capillary: 70 mg/dL (ref 70–99)
Glucose-Capillary: 153 mg/dL — ABNORMAL HIGH (ref 70–99)

## 2014-04-15 LAB — GLUCOSE, CAPILLARY
GLUCOSE-CAPILLARY: 205 mg/dL — AB (ref 70–99)
Glucose-Capillary: 101 mg/dL — ABNORMAL HIGH (ref 70–99)
Glucose-Capillary: 133 mg/dL — ABNORMAL HIGH (ref 70–99)
Glucose-Capillary: 121 mg/dL — ABNORMAL HIGH (ref 70–99)
Glucose-Capillary: 133 mg/dL — ABNORMAL HIGH (ref 70–99)

## 2014-04-16 LAB — GLUCOSE, CAPILLARY
GLUCOSE-CAPILLARY: 157 mg/dL — AB (ref 70–99)
GLUCOSE-CAPILLARY: 139 mg/dL — AB (ref 70–99)
Glucose-Capillary: 82 mg/dL (ref 70–99)
Glucose-Capillary: 136 mg/dL — ABNORMAL HIGH (ref 70–99)

## 2014-04-17 LAB — GLUCOSE, CAPILLARY
GLUCOSE-CAPILLARY: 122 mg/dL — AB (ref 70–99)
Glucose-Capillary: 88 mg/dL (ref 70–99)

## 2014-04-18 LAB — GLUCOSE, CAPILLARY
GLUCOSE-CAPILLARY: 114 mg/dL — AB (ref 70–99)
GLUCOSE-CAPILLARY: 126 mg/dL — AB (ref 70–99)
GLUCOSE-CAPILLARY: 126 mg/dL — AB (ref 70–99)
Glucose-Capillary: 89 mg/dL (ref 70–99)
Glucose-Capillary: 187 mg/dL — ABNORMAL HIGH (ref 70–99)
Glucose-Capillary: 108 mg/dL — ABNORMAL HIGH (ref 70–99)

## 2014-04-19 LAB — GLUCOSE, CAPILLARY
GLUCOSE-CAPILLARY: 108 mg/dL — AB (ref 70–99)
GLUCOSE-CAPILLARY: 118 mg/dL — AB (ref 70–99)
GLUCOSE-CAPILLARY: 127 mg/dL — AB (ref 70–99)
GLUCOSE-CAPILLARY: 122 mg/dL — AB (ref 70–99)

## 2014-04-20 LAB — GLUCOSE, CAPILLARY: Glucose-Capillary: 96 mg/dL (ref 70–99)

## 2014-04-21 ENCOUNTER — Non-Acute Institutional Stay (SKILLED_NURSING_FACILITY): Payer: PRIVATE HEALTH INSURANCE | Admitting: Internal Medicine

## 2014-04-21 DIAGNOSIS — I10 Essential (primary) hypertension: Secondary | ICD-10-CM

## 2014-04-21 DIAGNOSIS — J4489 Other specified chronic obstructive pulmonary disease: Secondary | ICD-10-CM | POA: Insufficient documentation

## 2014-04-21 DIAGNOSIS — J449 Chronic obstructive pulmonary disease, unspecified: Secondary | ICD-10-CM

## 2014-04-21 DIAGNOSIS — E119 Type 2 diabetes mellitus without complications: Secondary | ICD-10-CM

## 2014-04-21 DIAGNOSIS — F0390 Unspecified dementia without behavioral disturbance: Secondary | ICD-10-CM

## 2014-04-21 LAB — GLUCOSE, CAPILLARY
Glucose-Capillary: 126 mg/dL — ABNORMAL HIGH (ref 70–99)
Glucose-Capillary: 144 mg/dL — ABNORMAL HIGH (ref 70–99)
Glucose-Capillary: 123 mg/dL — ABNORMAL HIGH (ref 70–99)
Glucose-Capillary: 144 mg/dL — ABNORMAL HIGH (ref 70–99)
Glucose-Capillary: 90 mg/dL (ref 70–99)
Glucose-Capillary: 106 mg/dL — ABNORMAL HIGH (ref 70–99)
Glucose-Capillary: 114 mg/dL — ABNORMAL HIGH (ref 70–99)

## 2014-04-21 NOTE — Progress Notes (Signed)
         PROGRESS NOTE  DATE: 04/21/2014  FACILITY: Nursing Home Location: [place]  LEVEL OF CARE: SNF (31)  Routine Visit  CHIEF COMPLAINT:  Manage COPD, diabetes mellitus and dementia  HISTORY OF PRESENT ILLNESS:  REASSESSMENT OF ONGOING PROBLEM(S):  DEMENTIA: The dementia remaines stable and continues to function adequately in the current living environment with supervision.  The patient has had little changes in behavior. No complications noted from the medications presently being used.  COPD: the COPD remains stable.  Pt denies sob, cough, wheezing or declining exercise tolerance.  No complications from the medications presently being used. I was requested by the pharmacist to assess the patient for decreasing his Singulair dose.  DM:pt's DM remains stable.  Pt denies polyuria, polydipsia, polyphagia, changes in vision or hypoglycemic episodes.  No complications noted from the medication presently being used.  Last hemoglobin A1c is: Not available.  PAST MEDICAL HISTORY : Reviewed.  No changes/see problem list  CURRENT MEDICATIONS: Reviewed per MAR/see medication list  REVIEW OF SYSTEMS:  GENERAL: no change in appetite, no fatigue, no weight changes, no fever, chills or weakness RESPIRATORY: no cough, DOE, wheezing, hemoptysis, complains of chronic shortness of breath CARDIAC: no chest pain, or palpitations GI: no abdominal pain, diarrhea, constipation, heart burn, nausea or vomiting  PHYSICAL EXAMINATION  VS:  See VS section  GENERAL: no acute distress, normal body habitus EYES: conjunctivae normal, sclerae normal, normal eye lids NECK: supple, trachea midline, no neck masses, no thyroid tenderness, no thyromegaly LYMPHATICS: no LAN in the neck, no supraclavicular LAN RESPIRATORY: breathing is even & unlabored, BS CTAB CARDIAC: RRR, no murmur,no extra heart sounds,  +2 bilateral lower extremity edema GI: abdomen soft, normal BS, no masses, no  tenderness, no hepatomegaly, no splenomegaly PSYCHIATRIC: the patient is alert & oriented to person, affect & behavior appropriate  LABS/RADIOLOGY:  8-15 CO2 42 otherwise BMP normal 7-15 hemoglobin 10.1, MCV 93 otherwise CBC normal, albumin 3 otherwise liver profile normal  ASSESSMENT/PLAN:  COPD-stable. Decrease singulair to 10 mg daily. Diabetes mellitus-check hemoglobin A1c Dementia-check TSH and vitamin B12 level Hypertension-well controlled. continue current medications. Anemia of chronic disease-stable. Continue iron. Seizure disorder-well-controlled Hyperlipidemia-check fasting lipid panel Constipation-well-controlled Depression-continue celexa Check Depakote level  CPT CODE: 16109  Gayani Y Dasanayaka, MD [facility] [phone]

## 2014-04-22 LAB — GLUCOSE, CAPILLARY
GLUCOSE-CAPILLARY: 119 mg/dL — AB (ref 70–99)
Glucose-Capillary: 111 mg/dL — ABNORMAL HIGH (ref 70–99)
Glucose-Capillary: 128 mg/dL — ABNORMAL HIGH (ref 70–99)

## 2014-04-23 LAB — GLUCOSE, CAPILLARY
GLUCOSE-CAPILLARY: 109 mg/dL — AB (ref 70–99)
GLUCOSE-CAPILLARY: 116 mg/dL — AB (ref 70–99)
Glucose-Capillary: 133 mg/dL — ABNORMAL HIGH (ref 70–99)
Glucose-Capillary: 109 mg/dL — ABNORMAL HIGH (ref 70–99)

## 2014-04-24 LAB — GLUCOSE, CAPILLARY
GLUCOSE-CAPILLARY: 122 mg/dL — AB (ref 70–99)
GLUCOSE-CAPILLARY: 144 mg/dL — AB (ref 70–99)
Glucose-Capillary: 101 mg/dL — ABNORMAL HIGH (ref 70–99)
Glucose-Capillary: 148 mg/dL — ABNORMAL HIGH (ref 70–99)
Glucose-Capillary: 196 mg/dL — ABNORMAL HIGH (ref 70–99)

## 2014-04-25 LAB — GLUCOSE, CAPILLARY
GLUCOSE-CAPILLARY: 118 mg/dL — AB (ref 70–99)
Glucose-Capillary: 182 mg/dL — ABNORMAL HIGH (ref 70–99)

## 2014-04-26 LAB — GLUCOSE, CAPILLARY
GLUCOSE-CAPILLARY: 171 mg/dL — AB (ref 70–99)
GLUCOSE-CAPILLARY: 175 mg/dL — AB (ref 70–99)
Glucose-Capillary: 182 mg/dL — ABNORMAL HIGH (ref 70–99)
Glucose-Capillary: 189 mg/dL — ABNORMAL HIGH (ref 70–99)
Glucose-Capillary: 142 mg/dL — ABNORMAL HIGH (ref 70–99)
Glucose-Capillary: 127 mg/dL — ABNORMAL HIGH (ref 70–99)

## 2014-04-27 LAB — GLUCOSE, CAPILLARY
GLUCOSE-CAPILLARY: 122 mg/dL — AB (ref 70–99)
Glucose-Capillary: 134 mg/dL — ABNORMAL HIGH (ref 70–99)
Glucose-Capillary: 150 mg/dL — ABNORMAL HIGH (ref 70–99)
Glucose-Capillary: 175 mg/dL — ABNORMAL HIGH (ref 70–99)

## 2014-04-28 LAB — GLUCOSE, CAPILLARY: GLUCOSE-CAPILLARY: 125 mg/dL — AB (ref 70–99)

## 2014-04-29 LAB — GLUCOSE, CAPILLARY
GLUCOSE-CAPILLARY: 143 mg/dL — AB (ref 70–99)
GLUCOSE-CAPILLARY: 125 mg/dL — AB (ref 70–99)
GLUCOSE-CAPILLARY: 142 mg/dL — AB (ref 70–99)
Glucose-Capillary: 138 mg/dL — ABNORMAL HIGH (ref 70–99)
Glucose-Capillary: 184 mg/dL — ABNORMAL HIGH (ref 70–99)
Glucose-Capillary: 118 mg/dL — ABNORMAL HIGH (ref 70–99)
Glucose-Capillary: 108 mg/dL — ABNORMAL HIGH (ref 70–99)

## 2014-04-30 LAB — GLUCOSE, CAPILLARY
GLUCOSE-CAPILLARY: 129 mg/dL — AB (ref 70–99)
GLUCOSE-CAPILLARY: 135 mg/dL — AB (ref 70–99)
Glucose-Capillary: 113 mg/dL — ABNORMAL HIGH (ref 70–99)

## 2014-04-30 IMAGING — CR DG CHEST 1V PORT
1 series · 1 of 1 positions shown · non-contrast
Comparison: 08/18/2012

CLINICAL DATA: Altered mental status.

PORTABLE CHEST - 1 VIEW

[view not recorded]
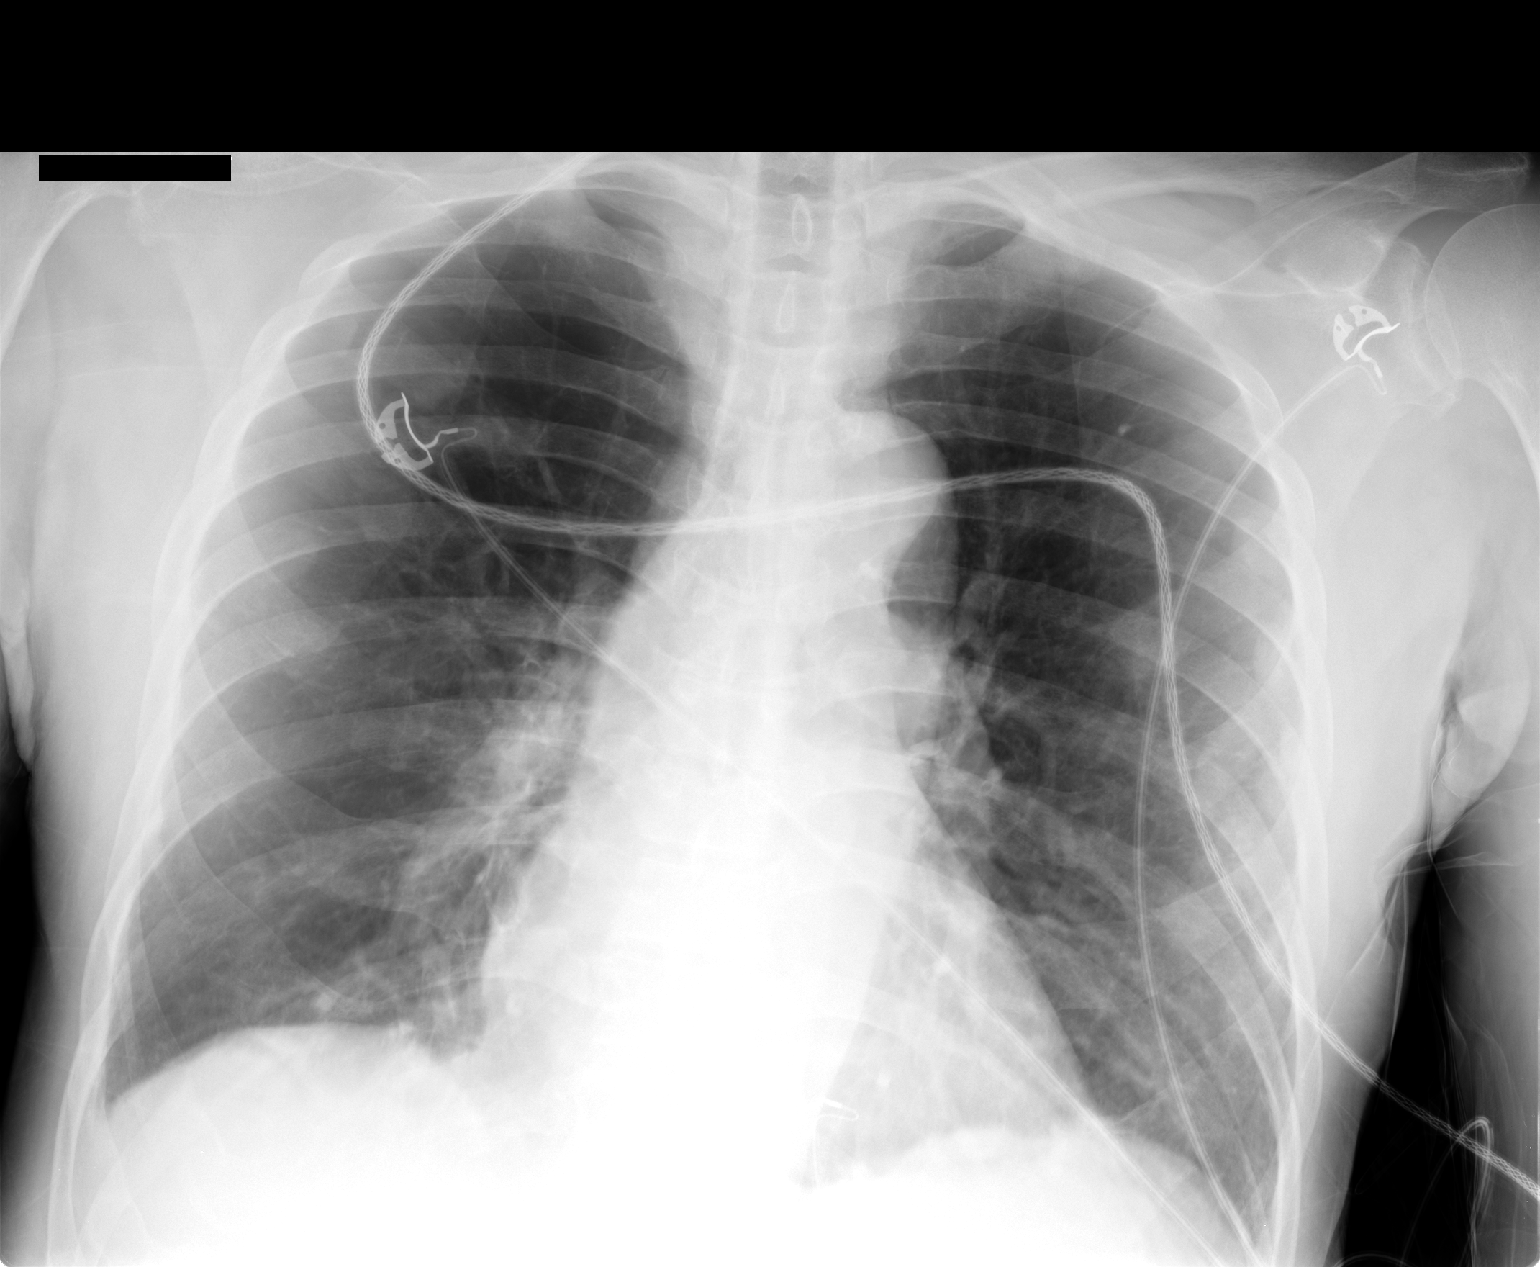

[1 of 1 positions shown; findings below may reference images not displayed]

FINDINGS: Heart size and pulmonary vascularity are normal.  No
infiltrates or effusions.  Multiple pleural based nodules are noted
bilaterally, unchanged.  Extensive emphysematous disease.  No acute
osseous abnormality.
IMPRESSION: No acute abnormality.  Emphysema.  Multiple pleural nodules, most
likely secondary to asbestos exposure.

## 2014-05-01 LAB — GLUCOSE, CAPILLARY
Glucose-Capillary: 111 mg/dL — ABNORMAL HIGH (ref 70–99)
Glucose-Capillary: 119 mg/dL — ABNORMAL HIGH (ref 70–99)

## 2014-05-02 LAB — GLUCOSE, CAPILLARY
GLUCOSE-CAPILLARY: 129 mg/dL — AB (ref 70–99)
Glucose-Capillary: 143 mg/dL — ABNORMAL HIGH (ref 70–99)

## 2014-05-03 LAB — GLUCOSE, CAPILLARY: Glucose-Capillary: 116 mg/dL — ABNORMAL HIGH (ref 70–99)

## 2014-05-04 LAB — GLUCOSE, CAPILLARY
Glucose-Capillary: 150 mg/dL — ABNORMAL HIGH (ref 70–99)
Glucose-Capillary: 107 mg/dL — ABNORMAL HIGH (ref 70–99)

## 2014-05-05 ENCOUNTER — Non-Acute Institutional Stay (SKILLED_NURSING_FACILITY): Payer: PRIVATE HEALTH INSURANCE | Admitting: Internal Medicine

## 2014-05-05 ENCOUNTER — Ambulatory Visit (HOSPITAL_COMMUNITY): Payer: PRIVATE HEALTH INSURANCE | Attending: Internal Medicine

## 2014-05-05 ENCOUNTER — Encounter: Payer: Self-pay | Admitting: Internal Medicine

## 2014-05-05 DIAGNOSIS — R05 Cough: Secondary | ICD-10-CM

## 2014-05-05 DIAGNOSIS — E1165 Type 2 diabetes mellitus with hyperglycemia: Secondary | ICD-10-CM

## 2014-05-05 DIAGNOSIS — R059 Cough, unspecified: Secondary | ICD-10-CM

## 2014-05-05 DIAGNOSIS — R918 Other nonspecific abnormal finding of lung field: Secondary | ICD-10-CM | POA: Insufficient documentation

## 2014-05-05 DIAGNOSIS — E119 Type 2 diabetes mellitus without complications: Secondary | ICD-10-CM

## 2014-05-05 DIAGNOSIS — D638 Anemia in other chronic diseases classified elsewhere: Secondary | ICD-10-CM

## 2014-05-05 LAB — GLUCOSE, CAPILLARY
Glucose-Capillary: 139 mg/dL — ABNORMAL HIGH (ref 70–99)
Glucose-Capillary: 111 mg/dL — ABNORMAL HIGH (ref 70–99)

## 2014-05-05 NOTE — Progress Notes (Signed)
Patient ID: [name] [name], male   DOB: 07-21-1931, 80 y.o.   MRN: [id number]   this is an acute visit.  Level of care skilled.  Facility [facility].  Chief complaint-acute visit secondary to cough.  History of present illness.  Patient is a pleasant 80 year old male with a history of severe COPD oxygen dependent with recent history of pneumonia and hypercapnia.  He actually has been quite stable recently however nursing staff has noted an increased cough today.  He does not really complain of any increased shortness of breath or discomfort-apparently eats quite well in fact his main complaint today is that he is hungry.  He continues on singular as well as duo nebs routinely 4 times a day  Patient also has a history of diabetes he is on Lantus as well as Glucophage-blood sugars were somewhat depressed recently and his Lantus was decreased from 12 units down to 10 units a day--recent blood sugars appear stable CBGs in a.m. and p.m. largely in the lower 100s to occasionally mid 100s in the p.m. --recent hemoglobin A1c was 6.2 which is satisfactory.  Family medical social history has been reviewed from previous progress notes including 04/21/2014-as well as July 27 and 03/31/2014.  Medications have been reviewed per MAR.  Review of systems.  The does not complaining of fever chills.  Respiratory cough has been noted but he is not really complaining of any increased shortness of breath.  Cardiac-does not complaining of chest pain had some mild lower extremity edema.  GI does not complaining of abdominal discomfort he does have some chronic distention no nausea or vomiting noted.  Muscle skeletal does not complaining of joint pain.  Neurologic no complaints of headache or dizziness.  Psych he does have some history of dementia but appears to be doing well in this setting.  Physical exam.  He is afebrile pulse 86 respirations 20 blood pressure 112/63 O2 saturation is in the low 90s  on oxygen.  In general this is a pleasant elderly male in no distress sitting comfortably in his wheelchair.  The skin is warm and dry.  Chest somewhat shallow air entry but no sensory muscle use no labored breathing small amount of rhonchi on expiration diffuse.  Heart is regular rate and rhythm with somewhat distant heart sounds appears to have some mild lower extremity edema but this does not appear that remarkable.  Abdomen soft distended soft nontender with positive bowel sounds.  Muscle skeletal is ambulating in a wheelchair moves all extremities and appears at baseline.  Neurologic cannot really appreciate any lateralizing findings his speech is clear cranial nerves appear grossly intact.  Labs.  04/26/2014.  Cholesterol 195 triglycerides 207 HDL 37 LDL 117.  TSH-2.209.  Hemoglobin A1c 6.2.  Depakote level XLIX.1.  04/12/2014.  Sodium 142 potassium 5.2 BUN 18 creatinine 0.89  04/05/2014.  WBC 8.5 hemoglobin 10.1 platelets 203.  Sodium 142 potassium 4.4 BUN 22 creatinine 1.1 to-CO2 level was 39.  Assessment and plan.  #1-cough-with his history of COPD pneumonia we'll be aggressive. Order a chest x-ray-also will order Mucinex 600 mg twice a day for 5 days-he continues on routine duo nebs 4 times a day  Monitor vital signs pulse ox every shift Also will obtain a basic metabolic panel Obtain  speech therapy consult--- rule out aspiration   #2-diabetes type 2-he is on Lantus and Glucophage blood sugars appear stable recent hemoglobin A1c was satisfactory at 6.2.  #3-anemia-will update a CBC to ensure stability as well.  He is on iron.  [id number]

## 2014-05-06 LAB — GLUCOSE, CAPILLARY
GLUCOSE-CAPILLARY: 174 mg/dL — AB (ref 70–99)
Glucose-Capillary: 108 mg/dL — ABNORMAL HIGH (ref 70–99)
Glucose-Capillary: 141 mg/dL — ABNORMAL HIGH (ref 70–99)

## 2014-05-07 LAB — GLUCOSE, CAPILLARY
GLUCOSE-CAPILLARY: 117 mg/dL — AB (ref 70–99)
GLUCOSE-CAPILLARY: 142 mg/dL — AB (ref 70–99)

## 2014-05-08 LAB — GLUCOSE, CAPILLARY
Glucose-Capillary: 87 mg/dL (ref 70–99)
Glucose-Capillary: 206 mg/dL — ABNORMAL HIGH (ref 70–99)

## 2014-05-09 LAB — GLUCOSE, CAPILLARY
GLUCOSE-CAPILLARY: 174 mg/dL — AB (ref 70–99)
Glucose-Capillary: 90 mg/dL (ref 70–99)

## 2014-05-10 LAB — GLUCOSE, CAPILLARY
Glucose-Capillary: 91 mg/dL (ref 70–99)
Glucose-Capillary: 137 mg/dL — ABNORMAL HIGH (ref 70–99)

## 2014-05-11 LAB — GLUCOSE, CAPILLARY
GLUCOSE-CAPILLARY: 98 mg/dL (ref 70–99)
Glucose-Capillary: 135 mg/dL — ABNORMAL HIGH (ref 70–99)

## 2014-05-12 LAB — GLUCOSE, CAPILLARY: Glucose-Capillary: 97 mg/dL (ref 70–99)

## 2014-05-13 LAB — GLUCOSE, CAPILLARY
GLUCOSE-CAPILLARY: 85 mg/dL (ref 70–99)
GLUCOSE-CAPILLARY: 129 mg/dL — AB (ref 70–99)
Glucose-Capillary: 175 mg/dL — ABNORMAL HIGH (ref 70–99)

## 2014-05-14 LAB — GLUCOSE, CAPILLARY
Glucose-Capillary: 96 mg/dL (ref 70–99)
Glucose-Capillary: 131 mg/dL — ABNORMAL HIGH (ref 70–99)

## 2014-05-15 LAB — GLUCOSE, CAPILLARY
Glucose-Capillary: 93 mg/dL (ref 70–99)
Glucose-Capillary: 166 mg/dL — ABNORMAL HIGH (ref 70–99)

## 2014-05-16 ENCOUNTER — Non-Acute Institutional Stay (SKILLED_NURSING_FACILITY): Payer: Medicare Other | Admitting: Internal Medicine

## 2014-05-16 ENCOUNTER — Encounter: Payer: Self-pay | Admitting: Internal Medicine

## 2014-05-16 ENCOUNTER — Ambulatory Visit (HOSPITAL_COMMUNITY)
Admit: 2014-05-16 | Discharge: 2014-05-16 | Disposition: A | Discharge status: Home / Self Care | Payer: PRIVATE HEALTH INSURANCE | Source: Ambulatory Visit | Attending: Internal Medicine | Admitting: Internal Medicine

## 2014-05-16 DIAGNOSIS — J449 Chronic obstructive pulmonary disease, unspecified: Secondary | ICD-10-CM | POA: Diagnosis not present

## 2014-05-16 DIAGNOSIS — R059 Cough, unspecified: Secondary | ICD-10-CM | POA: Diagnosis present

## 2014-05-16 DIAGNOSIS — R05 Cough: Secondary | ICD-10-CM | POA: Diagnosis present

## 2014-05-16 DIAGNOSIS — J9610 Chronic respiratory failure, unspecified whether with hypoxia or hypercapnia: Secondary | ICD-10-CM

## 2014-05-16 DIAGNOSIS — J4489 Other specified chronic obstructive pulmonary disease: Secondary | ICD-10-CM | POA: Insufficient documentation

## 2014-05-16 DIAGNOSIS — R635 Abnormal weight gain: Secondary | ICD-10-CM

## 2014-05-16 LAB — GLUCOSE, CAPILLARY: GLUCOSE-CAPILLARY: 118 mg/dL — AB (ref 70–99)

## 2014-05-16 NOTE — Progress Notes (Signed)
Patient ID: [name] [name], male   DOB: 01-Feb-1931, 80 y.o.   MRN: [id number]   This is an acute visit.  Level of care skilled.  Facility [facility]  Chief complaint-acute visit secondary to weight gain.  History of present illness.  Patient is a pleasant 80 year old male with a history of severe COPD he is oxygen dependent with a recent history of pneumonia and hypercapnia.  His stay here has been relatively unremarkable-he is relative doing well in regards to his respiratory issues despite his significant comorbidities-.  Apparently he is eating very well according to nursing staff and it appears over the past 3 weeks he's gained about 10 pounds-per chart review this appears to be somewhat of a steady progression weight today is 188-was 185 4 days ago-and 177 back on August 19 are  He does have somewhat chronic complaints of shortness of breath but according to nursing staff this is not new and complains of not increased.  He appears comfortable sitting in his wheelchair today he is on chronic oxygen O2 stats were in the 90s.  He continues on Singulair as well as duo nebs.  He does not complaining of any chest pain he appears to have some fairly mild lower extremity edema.  Family medical social history is been reviewed from previous progress notes including 05/05/2014-as well as 03/31/2014.  Medications have been reviewed per MAR.  Review of systems.  Gen. not complaining of any fever or chills.  Head ears eyes nose mouth and throat-does not complaining of sore throat he is legally blind.  Respiratory history of severe COPD has somewhat chronic complaints of shortness of breath but these complaints have not increased according to nursing staff.  Cardiac is not complaining of chest pain continued with some mild lower extremity edema.  GI is not complaining of any abdominal discomfort he has chronic distention does not complaining of nausea vomiting or constipation.  Muscle  skeletal is not complaining of any joint pain.  Neurologic no complaints of dizziness or headache or syncopal-type feelings.  Physical exam.  Temperature 98.3-pulse 94-respirations 20-blood pressure 103/57-O2 saturation 96% on 2 L oxygen-weight 188.8.  In general this is a pleasant elderly male in no distress sitting comfortably in his wheelchair.  His skin is warm and dry.  Chest he has somewhat shallow entry with very minimal wheezing on the end of expiration--no labored breathing.  Heart is regular rate and rhythm with somewhat distant heart sounds again he had some mild lower extremity edema this does not appear to be grossly changed from last exam.  Abdomen is distended soft nontender with positive bowel sounds.  Muscle skeletal moves all extremities x4 ambulates in a wheelchair does not appear to be changed from baseline.  Neurologic is grossly intact I did not see any lateralizing findings his speech is clear.  Psych he is oriented to self is pleasant and appropriate.  Labs.  05/06/2014.  WBC 6.9-hemoglobin 10.3-platelets 227.  Sodium 141-potassium 4.8-BUN 19-creatinine 0.93.  CO2 level XXXIX which is relatively baseline.  Assessment and plan.  #1 weight gain-I suspect his appetite could be contributing largely to this apparently he is feeling pretty good per nursing staff and eats well-however would like to order a chest x-ray did hear small amount of congestion I suspect this is COPD related but would like to rule out any pulmonary edema here--does have a history of nodules with asbestos exposure which has shown up on x-ray thought to be stable.  Also will update his  labs tomorrow including a metabolic panel and BNP-per chart review I do not see a recent cardiac echo or diagnosis of congestive heart failure.monitor weights daily notify provider of gain greater than 3 pounds-  Continue vital signs pulse ox every shift.  [id number]

## 2014-05-18 LAB — GLUCOSE, CAPILLARY
GLUCOSE-CAPILLARY: 122 mg/dL — AB (ref 70–99)
Glucose-Capillary: 100 mg/dL — ABNORMAL HIGH (ref 70–99)
Glucose-Capillary: 95 mg/dL (ref 70–99)
Glucose-Capillary: 142 mg/dL — ABNORMAL HIGH (ref 70–99)

## 2014-05-19 LAB — GLUCOSE, CAPILLARY
Glucose-Capillary: 153 mg/dL — ABNORMAL HIGH (ref 70–99)
Glucose-Capillary: 110 mg/dL — ABNORMAL HIGH (ref 70–99)

## 2014-05-20 LAB — GLUCOSE, CAPILLARY
GLUCOSE-CAPILLARY: 129 mg/dL — AB (ref 70–99)
Glucose-Capillary: 127 mg/dL — ABNORMAL HIGH (ref 70–99)
Glucose-Capillary: 126 mg/dL — ABNORMAL HIGH (ref 70–99)

## 2014-05-21 LAB — GLUCOSE, CAPILLARY: GLUCOSE-CAPILLARY: 131 mg/dL — AB (ref 70–99)

## 2014-05-22 LAB — GLUCOSE, CAPILLARY
GLUCOSE-CAPILLARY: 114 mg/dL — AB (ref 70–99)
GLUCOSE-CAPILLARY: 125 mg/dL — AB (ref 70–99)

## 2014-05-23 ENCOUNTER — Ambulatory Visit (HOSPITAL_COMMUNITY): Payer: PRIVATE HEALTH INSURANCE | Attending: Internal Medicine

## 2014-05-23 DIAGNOSIS — J449 Chronic obstructive pulmonary disease, unspecified: Secondary | ICD-10-CM | POA: Diagnosis not present

## 2014-05-23 DIAGNOSIS — J4489 Other specified chronic obstructive pulmonary disease: Secondary | ICD-10-CM | POA: Insufficient documentation

## 2014-05-23 DIAGNOSIS — R059 Cough, unspecified: Secondary | ICD-10-CM | POA: Diagnosis present

## 2014-05-23 DIAGNOSIS — R05 Cough: Secondary | ICD-10-CM | POA: Diagnosis present

## 2014-05-23 LAB — GLUCOSE, CAPILLARY
Glucose-Capillary: 131 mg/dL — ABNORMAL HIGH (ref 70–99)
Glucose-Capillary: 106 mg/dL — ABNORMAL HIGH (ref 70–99)

## 2014-05-24 LAB — GLUCOSE, CAPILLARY
Glucose-Capillary: 96 mg/dL (ref 70–99)
Glucose-Capillary: 135 mg/dL — ABNORMAL HIGH (ref 70–99)

## 2014-05-25 LAB — GLUCOSE, CAPILLARY
GLUCOSE-CAPILLARY: 120 mg/dL — AB (ref 70–99)
GLUCOSE-CAPILLARY: 140 mg/dL — AB (ref 70–99)

## 2014-05-26 LAB — GLUCOSE, CAPILLARY
GLUCOSE-CAPILLARY: 116 mg/dL — AB (ref 70–99)
Glucose-Capillary: 110 mg/dL — ABNORMAL HIGH (ref 70–99)
Glucose-Capillary: 146 mg/dL — ABNORMAL HIGH (ref 70–99)

## 2014-05-27 LAB — GLUCOSE, CAPILLARY
GLUCOSE-CAPILLARY: 111 mg/dL — AB (ref 70–99)
Glucose-Capillary: 109 mg/dL — ABNORMAL HIGH (ref 70–99)

## 2014-05-28 LAB — GLUCOSE, CAPILLARY: Glucose-Capillary: 98 mg/dL (ref 70–99)

## 2014-05-29 LAB — GLUCOSE, CAPILLARY
Glucose-Capillary: 118 mg/dL — ABNORMAL HIGH (ref 70–99)
Glucose-Capillary: 117 mg/dL — ABNORMAL HIGH (ref 70–99)
Glucose-Capillary: 108 mg/dL — ABNORMAL HIGH (ref 70–99)

## 2014-05-30 LAB — GLUCOSE, CAPILLARY: Glucose-Capillary: 153 mg/dL — ABNORMAL HIGH (ref 70–99)

## 2014-05-31 LAB — GLUCOSE, CAPILLARY
GLUCOSE-CAPILLARY: 118 mg/dL — AB (ref 70–99)
GLUCOSE-CAPILLARY: 104 mg/dL — AB (ref 70–99)
Glucose-Capillary: 126 mg/dL — ABNORMAL HIGH (ref 70–99)

## 2014-06-01 LAB — GLUCOSE, CAPILLARY
GLUCOSE-CAPILLARY: 112 mg/dL — AB (ref 70–99)
Glucose-Capillary: 94 mg/dL (ref 70–99)

## 2014-06-02 LAB — GLUCOSE, CAPILLARY
GLUCOSE-CAPILLARY: 95 mg/dL (ref 70–99)
Glucose-Capillary: 109 mg/dL — ABNORMAL HIGH (ref 70–99)

## 2014-06-03 LAB — GLUCOSE, CAPILLARY: Glucose-Capillary: 163 mg/dL — ABNORMAL HIGH (ref 70–99)

## 2014-06-04 LAB — GLUCOSE, CAPILLARY
GLUCOSE-CAPILLARY: 112 mg/dL — AB (ref 70–99)
Glucose-Capillary: 110 mg/dL — ABNORMAL HIGH (ref 70–99)
Glucose-Capillary: 112 mg/dL — ABNORMAL HIGH (ref 70–99)

## 2014-06-05 LAB — GLUCOSE, CAPILLARY
GLUCOSE-CAPILLARY: 94 mg/dL (ref 70–99)
GLUCOSE-CAPILLARY: 150 mg/dL — AB (ref 70–99)

## 2014-06-06 LAB — GLUCOSE, CAPILLARY
GLUCOSE-CAPILLARY: 102 mg/dL — AB (ref 70–99)
Glucose-Capillary: 169 mg/dL — ABNORMAL HIGH (ref 70–99)

## 2014-06-07 LAB — GLUCOSE, CAPILLARY
Glucose-Capillary: 132 mg/dL — ABNORMAL HIGH (ref 70–99)
Glucose-Capillary: 131 mg/dL — ABNORMAL HIGH (ref 70–99)

## 2014-06-08 ENCOUNTER — Ambulatory Visit (HOSPITAL_COMMUNITY): Payer: PRIVATE HEALTH INSURANCE | Attending: Internal Medicine

## 2014-06-08 DIAGNOSIS — R109 Unspecified abdominal pain: Secondary | ICD-10-CM | POA: Insufficient documentation

## 2014-06-08 DIAGNOSIS — R141 Gas pain: Secondary | ICD-10-CM | POA: Insufficient documentation

## 2014-06-08 DIAGNOSIS — R142 Eructation: Secondary | ICD-10-CM

## 2014-06-08 DIAGNOSIS — R143 Flatulence: Secondary | ICD-10-CM

## 2014-06-08 LAB — GLUCOSE, CAPILLARY: Glucose-Capillary: 139 mg/dL — ABNORMAL HIGH (ref 70–99)

## 2014-06-09 ENCOUNTER — Encounter: Payer: Self-pay | Admitting: Internal Medicine

## 2014-06-09 ENCOUNTER — Non-Acute Institutional Stay (SKILLED_NURSING_FACILITY): Payer: Medicare Other | Admitting: Internal Medicine

## 2014-06-09 DIAGNOSIS — J449 Chronic obstructive pulmonary disease, unspecified: Secondary | ICD-10-CM

## 2014-06-09 DIAGNOSIS — R141 Gas pain: Secondary | ICD-10-CM

## 2014-06-09 DIAGNOSIS — E785 Hyperlipidemia, unspecified: Secondary | ICD-10-CM

## 2014-06-09 DIAGNOSIS — R140 Abdominal distension (gaseous): Secondary | ICD-10-CM

## 2014-06-09 DIAGNOSIS — G40909 Epilepsy, unspecified, not intractable, without status epilepticus: Secondary | ICD-10-CM

## 2014-06-09 DIAGNOSIS — R143 Flatulence: Secondary | ICD-10-CM

## 2014-06-09 DIAGNOSIS — K5909 Other constipation: Secondary | ICD-10-CM

## 2014-06-09 DIAGNOSIS — I10 Essential (primary) hypertension: Secondary | ICD-10-CM

## 2014-06-09 DIAGNOSIS — R142 Eructation: Secondary | ICD-10-CM

## 2014-06-09 DIAGNOSIS — E118 Type 2 diabetes mellitus with unspecified complications: Secondary | ICD-10-CM

## 2014-06-09 DIAGNOSIS — D638 Anemia in other chronic diseases classified elsewhere: Secondary | ICD-10-CM

## 2014-06-09 LAB — GLUCOSE, CAPILLARY
GLUCOSE-CAPILLARY: 115 mg/dL — AB (ref 70–99)
Glucose-Capillary: 159 mg/dL — ABNORMAL HIGH (ref 70–99)
Glucose-Capillary: 106 mg/dL — ABNORMAL HIGH (ref 70–99)

## 2014-06-09 NOTE — Progress Notes (Signed)
Patient ID: [name] [name], male   DOB: Feb 11, 1931, 80 y.o.   MRN: [id number]   Facility; [facility]  routine visit.    Chief complaint; management of chronic medical conditions including COPD dementia-diabetes type 2-anemia-seizure disorder hypertension hyperlipidemia   HPI-- ; this is an 80 year old man who I believe was an ongoing resident at [place] facility. He was admitted to the hospital with worsening mental status and unresponsiveness and acute on chronic respiratory failure. He has severe COPD and is on nocturnal BiPAP but. He was admitted with significant hypercapnia and associated metabolic encephalopathy. It appears that his baseline PCO2 runs in the 70s. He was admitted to the step down unit started on BiPAP, intravenous steroids antibiotics and bronchodilators. Chest x-ray suggested pneumonia. He was treated with 7 days of antibiotics.  The patient has a lot of other significant comorbidities including blindness, deafness and dementia with a history of depression and delusions---  Patient appears to have stabilized recently-I saw him recently recently for significant weight gain but he was eating better and  weight appears to have stabilized over the past month currently in the high 180s  .-Respiratory-wise he has been stable as well-he continues on chronic oxygen and is on routine nebulizers.  Patient does have some chronic abdominal distention he is on numerous laxatives including MiraLax routinely and Dulcolax Senna M. Fleet enema when necessary-according to nursing he is having regular bowel movements.-In fact he had 3 bowel movements yesterday  He did have an abdominal x-ray done yesterday which did not show any acute process-it did show some stool--and I did discuss the findings with his family at bedside.  He also has a history of hypertension he continues on Norvasc and this is stable recent blood pressure is 127/73-129/79-134/75.  He also has a history of seizure  disorder he is on Depakote as well as Keppra there been no seizures during his stay here to my knowledge  In regard to diabetes he is on Lantus as well as Glucophage and blood sugars appear to be quite satisfactory morning sugars run from the 90s-low 100s consistently.  At bedtime blood sugars show stability largely in the lower 100s    .   Marland Kitchen  Past Medical History   Diagnosis  Date   .  Glaucoma    .  Anxiety    .  GERD (gastroesophageal reflux disease)    .  Hyperlipidemia    .  Hypertension    .  Chronic abdominal pain    .  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  Chronic respiratory failure    .  Altered mental status  06/12/2012; 08/14/2012     Unwitnessed isolated seizure suspected, but because of the EEG being normal, Dr. Merlene Laughter did not recommend antiseizure therapy.; "violent since 08/13/2012" (08/14/2012)   .  Hard of hearing    .  Seizure  06/14/2012     Isolated seizure suspected, but not confirmed.   .  Bradycardia    .  Complication of anesthesia  2005     "couldn't get him woke up when he should have after eye surgery" (08/14/2012)   .  Mesothelioma    .  Pleural plaque  04/2012   .  Exertional dyspnea    .  DM type 2 (diabetes mellitus, type 2)  06/13/2012   .  UTI (lower urinary tract infection)     Past Surgical History   Procedure  Laterality  Date   .  Inguinal hernia repair     .  Prostate surgery     .  Left eye surgery for glaucoma at [facility]   10/2009   .  Cataract extraction w/ intraocular lens implant   2005     "left" (08/14/2012)    Medications have been reviewed per MAR .         .       .     11   .       .       .       .     0   .        .     0   .       .        .       .       .       .       .       .       .       .        .       .     0      Social history; I suspect this is a long-standing nursing home resident.  History    Social History   .  Marital Status:  Widowed     Spouse Name:  N/A      Number of Children:  8   .  Years of Education:  N/A    Occupational History   .  diasable     Social History Topics   .  Smoking status:  Former Smoker -- 2.00 packs/day for 40 years     Types:  Cigarettes   .  Smokeless tobacco:  Never Used      Comment: "quit smoking cigarettes ~ 2001"   .  Alcohol Use:  Yes      Comment: 08/14/2012 "used to drink heavily; stopped ~ 2001"   .  Drug Use:  No   .  Sexual Activity:  Not on file    Other Topics  Concern   .  Not on file    Social History Narrative   .  No narrative on file   it his mother is deceased. He father is deceased.  Apparently a sister and 2 brothers who are deceased as well    Review of Systems;  in general, this is not possible predominantly due to hearing loss. Even with his hearing aid in his right ear t this is difficult past but he is not complaining of any shortness of breath chest pain-occasionally will complain of abdominal discomfort the family this has been somewhat chronic-x-ray results as noted above-family feels possibly he may be getting hungry at times during the day and this may give him some stomach discomfort  .  Physical examination  Temperature 97.9 pulse 94 respirations 22 blood pressure 127/73-weight is 187.6 this appears to have stabilized over the past month--O2 saturations are in the 90s on chronic oxygen   Gen. Patient is in no distress sitting comfortably in his wheelchair   Skin is warm and dry HEENT; t He has bilateral blindness. Oral exam is essentially normal.  Chest-he has a prolonged expiratory phase I cannot really appreciate any congestion there is no labored breathing--air entry is shallow Cardiac heart sounds are distant--regular rate and rhythm. JVP is not elevated he has no peripheral edema  Abdomen; distended --positive bowel sounds. There is no shifting dullness    Neurologic;  patient  able to lift his arms and legs there does not seem to be any lateralizing weakness.  H Mental status; he is appropriate with any verbal instructions-again this is complicated with his extensive loss of hearing    Labs.  06/09/2014.  Sodium 138 potassium 4.1 BUN 21 creatinine 0.92.  WBC 10.3 hemoglobin 12.0 platelets 277  05/17/2014.  BNP 82.8.  05/06/2014-liver function tests within normal limits except AST of 77 bilirubin of 0.1 albumin of 3.1.  04/26/2014.  Cholesterol 195-triglycerides 207-LDL 117 HDL 37.  TSH 2.209.  Vitamin B12 524.  Hemoglobin A1c 6.2.    Impression/plan   #1 acute on chronic respiratory failure-hc COPD---this appears stable-CO2 level on recent metabolic panel was 62-BJ does receive BiPAP at night  #2 history of abdominal distention-this appears to be chronic abdominal x-ray was fairly benign-he is receiving numerous laxatives and apparently is having regular bowel movements-at this point will monitor this was discussed with family at bedside .  #3 type 2 diabetes he is now on Lantus. As well as Glucophage-this appears stable as noted above hemoglobin A1c of 6.2 was satisfactory  #4 blindness and profound deafness makes communication with this man very difficult but he appears been doing relatively well in this facility      #5 anemia of chronic disease--appears to be stable --of note he is on iron--and HBG has shown steady improvement   #6 dementia continues on Aricept---difficult to fully assess because of his hearing difficulties nonetheless he appears to be doing well in this setting as gained weight which is stabilized-family feels he may be getting hungry later in the day which leads to some abdominal discomfort-have spoken with staff about this Will have to monitor weights--  #7-seizure disorder-this appears stable on Depakote as well as Keppra-we'll update liver function tests and Depakote level I do note he had a mildly elevated liver enzyme previously.  #8-hyperlipidemia-continues on a statin-recent lipid panel was  fairly satisfactory note mildly elevated LDL-would like to update liver function tests before making any adjustments--secondary to patient's relatively advanced age and comorbidities would be conservative  #9-hypertension-this appears stable as noted above he is on Norvasc   #10-depression-continues on Celexa--  Number11-history glucoma-continues on topical eyedrops Alphagan and Xalatanand pilocarpine  Number  12-history of GERD this appears to be stable he is on a PPI    [id number]-of note greater than 40 minutes spentassessing patient-reviewing patient's status with nursing staff as well as with his family at bedside-and coordinating and formulating a plan of care for numerous diagnoses-of note greater than 50% of time spent coordinating plan of care

## 2014-06-10 LAB — GLUCOSE, CAPILLARY: Glucose-Capillary: 97 mg/dL (ref 70–99)

## 2014-06-12 ENCOUNTER — Inpatient Hospital Stay (HOSPITAL_COMMUNITY): Payer: Medicare Other | Attending: Internal Medicine

## 2014-06-12 ENCOUNTER — Encounter: Payer: Self-pay | Admitting: Internal Medicine

## 2014-06-12 ENCOUNTER — Non-Acute Institutional Stay (SKILLED_NURSING_FACILITY): Payer: Medicare Other | Admitting: Internal Medicine

## 2014-06-12 DIAGNOSIS — G40909 Epilepsy, unspecified, not intractable, without status epilepticus: Secondary | ICD-10-CM

## 2014-06-12 DIAGNOSIS — D649 Anemia, unspecified: Secondary | ICD-10-CM

## 2014-06-12 DIAGNOSIS — F0390 Unspecified dementia without behavioral disturbance: Secondary | ICD-10-CM

## 2014-06-12 DIAGNOSIS — J9602 Acute respiratory failure with hypercapnia: Secondary | ICD-10-CM

## 2014-06-12 DIAGNOSIS — I10 Essential (primary) hypertension: Secondary | ICD-10-CM

## 2014-06-12 DIAGNOSIS — E119 Type 2 diabetes mellitus without complications: Secondary | ICD-10-CM | POA: Insufficient documentation

## 2014-06-12 LAB — GLUCOSE, CAPILLARY
Glucose-Capillary: 123 mg/dL — ABNORMAL HIGH (ref 70–99)
Glucose-Capillary: 116 mg/dL — ABNORMAL HIGH (ref 70–99)
Glucose-Capillary: 105 mg/dL — ABNORMAL HIGH (ref 70–99)
Glucose-Capillary: 97 mg/dL (ref 70–99)

## 2014-06-12 NOTE — Progress Notes (Signed)
Patient ID: [name] [name], male   DOB: 10-05-1930, 80 y.o.   MRN: [id number]   This is a routine visit.  Level of care skilled.  Facility [facility].  Chief complaint medical management of chronic medical issues including severe COPD oxygen dependent-seizure disorder diabetes type 2-dementia.  History of present illness.  Patient is a pleasant 80 year old male with the above diagnoses-despite his numerous conditions he continues to be relatively stable.  He does have a history of severe  COPD with previous history of hypercapnia however this has been stable.  He also has a history of pneumonia  Respiratory wise he has somewhat chronic complaints of shortness of breath but these have not increased from baseline-he is on  routine nebulizers 4 times a day  He is also a type II diabetic on Lantus and Glucophage--a.m. sugars run 94-163 more in the lower 100s average.  9 PM sugars continue to be consistently in the lower 100s.  He has a history seizure disorder he is on Depakote as well as Keppra-there've been no recent seizures.  He does have a history he does have a history of anemia -he is on iron and hemoglobin has been stable we will update this.  He also has a history of hypertension and continues on Norvasc this appears to be well controlled most recently 125/71-129/79-134/75.   his weight has been stable largely in the high 180s recently.  He has no acute complaints tonight family is somewhat concerned about his breathing however and we will order a chest x-ray as well as a metabolic panel to check his CO2 level-per nursing his breathing appears to be pretty much at baseline   Family medical social history as been reviewed her previous progress notes including 05/05/2014 as well as 05/16/2014.  Medications have been reviewed per MAR.  Review of systems.  In general he is not complaining of fever chills.  Head ears eyes nose mouth and throat-he is legally blind is not  complaining of any sore throat nasal discharge.  Respiratory history of severe COPD with somewhat chronic complaints of shortness of breath-O2 sats continued to be satisfactory.  Heart does not complaining of chest pain and some mild lower extremity edema which appears to be baseline.  GI is not complaining of any abdominal discomfort nausea or vomiting Her constipation apparently had a large bowel movement yesterday.  Muscle skeletal does not complaining of joint pain.  Neurologic he is not complaining of any dizziness .  Physical exam.  Denture 98.1 pulse 92 respirations 18 blood pressure 125/71 O2 saturation 96% on 2-3 L-weight is stable at 189.4.  In general this is a fairly well-nourished elderly male in no distress resting comfortably in bed.  His skin is warm and dry.  Chest has shallow air entry with a small amount of expiratory wheezing this appears relatively baseline t his breathing is nonlabored.  Heart is distant heart sounds regular rate and rhythm-he has fairly mild trace lower extremity edema bilaterally which appears to be baseline.  His abdomen continues to be distended but soft nontender with active bowel sounds.  Muscle skeletal moves all extremities x4 ambulates in a wheelchair during the day I do not any deformities.  Neurologic appears grossly intact his speech is clear.  Psych he is oriented to self pleasant and follows simple verbal commands he is conversant.  Labs.  05/17/2014.  Sodium 144 potassium 4.9 BUN 19 creatinine 0.96 CO2 level of 37 which appears to be on the lower end of  his baseline.  BMP-82.7.  05/06/2014.  WBC 6.9 hemoglobin 10.3 platelets 227.  Sodium 141 potassium 4.8 BUN 19 creatinine 0.93 CO2 level at that time was 39.  AST 77 phosphatase 63 ALT 7.  On the 17th 2015.  Triglycerides 207 HDL 37 LDL 117.  TSH-2.209.  Vitamin B12 524.  Hemoglobin A1c-6.2   Assessment and plan.  #1-history of severe COPD-this appears  relatively stable the family is concerned however and will order a chest x-ray-also with history of hypercapnia we'll order a BMP-continues on routine nebulizers as well as routine oxygen.  #2 diabetes type 2 this appears to be stable on Lantus as well as Glucophage hemoglobin A1c recently was satisfactory continue to monitor.  #3-history of dementia-this appears relatively baseline he appears to be doing well in this setting he continues on Aricept.  #4-history of seizure disorder he is on Depakote as well as Keppra this is been stable--- updated Depakote level is pending  #5-history anemia this has been stable we'll update CBC--he is on iron.  #6-history of glaucoma-he continues on topical eyedrops this is followed by ophthalmology.  #7-hyperlipidemia-this appears to be relatively stable considering patient's comorbidities and advanced age somewhat conservative here-I do note AST was mildly elevated on recent lab-update has been ordered--he is on a statin  #8--- history of hypertension-this appears to be stable on Norvasc.  #9-history of depression he continues on Celexa this appears to be stable appetite appears to be good again he appears to be doing well in this setting  #10-. History of anxiety-she does continue on lorazepam twice a day-this appears to be relatively well controlled.  Addendum.  We have obtained results of stat lab work ordered this evening-white count is normal at 9.5 hemoglobin 11.2 his baseline platelets 235.  In regards to respiratory issues his CO2 level was 39 which is relatively his baseline-BUN 24 creatinine 1.07.  I do note a minimally elevated potassium of 5.4tthe sodium is 144.  I do not note that patient is on any ACE inhibitor or potassium supplementation-renal function otherwise appears relatively at baseline.  One would question possibly of slight hemolysis or inaccuracy here.  Since potassium is minimally elevated will recheck this stat a.m.  tomorrow-   Of note  also have obtained a chest x-ray which shows no changes -- did show emphysematous changes which is not new-no active disease--   [id number]-of note greater than 35 minutes spent assessing patient-discussing his status with nursing-reviewing his chart-and coordinating and formulating a plan of care for numerous diagnoses-of note greater than 50% of time spent coordinating plan of care

## 2014-06-13 LAB — GLUCOSE, CAPILLARY
GLUCOSE-CAPILLARY: 107 mg/dL — AB (ref 70–99)
Glucose-Capillary: 180 mg/dL — ABNORMAL HIGH (ref 70–99)

## 2014-06-14 LAB — GLUCOSE, CAPILLARY
GLUCOSE-CAPILLARY: 117 mg/dL — AB (ref 70–99)
GLUCOSE-CAPILLARY: 86 mg/dL (ref 70–99)

## 2014-06-15 LAB — GLUCOSE, CAPILLARY
GLUCOSE-CAPILLARY: 107 mg/dL — AB (ref 70–99)
Glucose-Capillary: 139 mg/dL — ABNORMAL HIGH (ref 70–99)
Glucose-Capillary: 85 mg/dL (ref 70–99)

## 2014-06-16 LAB — GLUCOSE, CAPILLARY: GLUCOSE-CAPILLARY: 92 mg/dL (ref 70–99)

## 2014-06-17 LAB — GLUCOSE, CAPILLARY
GLUCOSE-CAPILLARY: 126 mg/dL — AB (ref 70–99)
GLUCOSE-CAPILLARY: 93 mg/dL (ref 70–99)
Glucose-Capillary: 142 mg/dL — ABNORMAL HIGH (ref 70–99)

## 2014-06-19 LAB — GLUCOSE, CAPILLARY: Glucose-Capillary: 134 mg/dL — ABNORMAL HIGH (ref 70–99)

## 2014-06-20 LAB — GLUCOSE, CAPILLARY
GLUCOSE-CAPILLARY: 82 mg/dL (ref 70–99)
GLUCOSE-CAPILLARY: 94 mg/dL (ref 70–99)
Glucose-Capillary: 94 mg/dL (ref 70–99)
Glucose-Capillary: 143 mg/dL — ABNORMAL HIGH (ref 70–99)
Glucose-Capillary: 211 mg/dL — ABNORMAL HIGH (ref 70–99)

## 2014-06-21 LAB — GLUCOSE, CAPILLARY: GLUCOSE-CAPILLARY: 102 mg/dL — AB (ref 70–99)

## 2014-06-23 LAB — GLUCOSE, CAPILLARY
GLUCOSE-CAPILLARY: 103 mg/dL — AB (ref 70–99)
Glucose-Capillary: 121 mg/dL — ABNORMAL HIGH (ref 70–99)
Glucose-Capillary: 118 mg/dL — ABNORMAL HIGH (ref 70–99)
Glucose-Capillary: 114 mg/dL — ABNORMAL HIGH (ref 70–99)
Glucose-Capillary: 96 mg/dL (ref 70–99)

## 2014-06-24 LAB — GLUCOSE, CAPILLARY
Glucose-Capillary: 156 mg/dL — ABNORMAL HIGH (ref 70–99)
Glucose-Capillary: 148 mg/dL — ABNORMAL HIGH (ref 70–99)

## 2014-06-25 LAB — GLUCOSE, CAPILLARY
GLUCOSE-CAPILLARY: 109 mg/dL — AB (ref 70–99)
Glucose-Capillary: 124 mg/dL — ABNORMAL HIGH (ref 70–99)

## 2014-06-27 LAB — GLUCOSE, CAPILLARY
GLUCOSE-CAPILLARY: 103 mg/dL — AB (ref 70–99)
GLUCOSE-CAPILLARY: 126 mg/dL — AB (ref 70–99)
Glucose-Capillary: 105 mg/dL — ABNORMAL HIGH (ref 70–99)
Glucose-Capillary: 109 mg/dL — ABNORMAL HIGH (ref 70–99)

## 2014-06-28 LAB — GLUCOSE, CAPILLARY: Glucose-Capillary: 105 mg/dL — ABNORMAL HIGH (ref 70–99)

## 2014-06-29 LAB — GLUCOSE, CAPILLARY
Glucose-Capillary: 126 mg/dL — ABNORMAL HIGH (ref 70–99)
Glucose-Capillary: 132 mg/dL — ABNORMAL HIGH (ref 70–99)

## 2014-06-30 LAB — GLUCOSE, CAPILLARY
GLUCOSE-CAPILLARY: 128 mg/dL — AB (ref 70–99)
GLUCOSE-CAPILLARY: 115 mg/dL — AB (ref 70–99)
GLUCOSE-CAPILLARY: 139 mg/dL — AB (ref 70–99)

## 2014-07-01 LAB — GLUCOSE, CAPILLARY
Glucose-Capillary: 104 mg/dL — ABNORMAL HIGH (ref 70–99)
Glucose-Capillary: 99 mg/dL (ref 70–99)

## 2014-07-02 ENCOUNTER — Ambulatory Visit (HOSPITAL_COMMUNITY): Payer: Medicare Other | Attending: Internal Medicine

## 2014-07-02 DIAGNOSIS — R109 Unspecified abdominal pain: Secondary | ICD-10-CM | POA: Insufficient documentation

## 2014-07-02 DIAGNOSIS — R140 Abdominal distension (gaseous): Secondary | ICD-10-CM | POA: Diagnosis not present

## 2014-07-02 LAB — GLUCOSE, CAPILLARY: Glucose-Capillary: 128 mg/dL — ABNORMAL HIGH (ref 70–99)

## 2014-07-03 ENCOUNTER — Non-Acute Institutional Stay (SKILLED_NURSING_FACILITY): Payer: Medicare Other | Admitting: Internal Medicine

## 2014-07-03 DIAGNOSIS — D508 Other iron deficiency anemias: Secondary | ICD-10-CM

## 2014-07-03 DIAGNOSIS — F0390 Unspecified dementia without behavioral disturbance: Secondary | ICD-10-CM

## 2014-07-03 DIAGNOSIS — G40909 Epilepsy, unspecified, not intractable, without status epilepticus: Secondary | ICD-10-CM

## 2014-07-03 DIAGNOSIS — I10 Essential (primary) hypertension: Secondary | ICD-10-CM

## 2014-07-03 DIAGNOSIS — R140 Abdominal distension (gaseous): Secondary | ICD-10-CM

## 2014-07-03 DIAGNOSIS — J9610 Chronic respiratory failure, unspecified whether with hypoxia or hypercapnia: Secondary | ICD-10-CM

## 2014-07-03 DIAGNOSIS — E119 Type 2 diabetes mellitus without complications: Secondary | ICD-10-CM

## 2014-07-03 LAB — GLUCOSE, CAPILLARY
GLUCOSE-CAPILLARY: 111 mg/dL — AB (ref 70–99)
Glucose-Capillary: 127 mg/dL — ABNORMAL HIGH (ref 70–99)
Glucose-Capillary: 129 mg/dL — ABNORMAL HIGH (ref 70–99)
Glucose-Capillary: 99 mg/dL (ref 70–99)

## 2014-07-03 NOTE — Progress Notes (Signed)
Patient ID: [name] [name], male   DOB: 08-Dec-1930, 80 y.o.   MRN: [id number]   This is an acute  visit.  Level of care skilled.  Facility [facility].   Chief complaint--- acute visit follow-up abdominal distention-- follow-up COPD-diabetes type 2   History of present illness.  Patient is a pleasant 80 year old male with the above diagnoses-despite his numerous conditions he continues to be relatively stable. Patient continues to have some chronic abdominal distention-at one point he did show possible constipation but most recent x-ray ordered this week secondary to some staff concerns was negative for any acute process.  He does not really complain NAD abdominal discomfort apparently is having regular bowel movements at this point appears stable and did reassure the family.    He does have a history of severe COPD with previous history of hypercapnia however this has been stable.--CO2 level has been somewhat chronically elevated most recently 73 which is around his baseline there have been orders to try to keep his O2 level as close to 90 as possible  He also has a history of pneumonia -- Respiratory wise he has somewhat chronic complaints of shortness of breath but these have not increased from baseline-he is on routine nebulizers 4 times a day   He is also a type II diabetic on Lantus and Glucophage--per review of CBGs today it appears his CBGs run largely in the 80s to low 100s-will probably cut back on his Lantus secondary to concerns of some frequent readings in the 80s.  He has a history seizure disorder he is on Depakote as well as Keppra-there've been no recent seizures.  He does have a history he does have a history of anemia -he is on iron and hemoglobin has been stable we will update this--.  He also has a history of hypertension and continues on Norvasc this appears to have some variability-last one I see listed as 157/78 I also see 166/85 however I do see somewhat lower ones as well I  did take it manually today and got 136/70  his weight has been stable largely in the high 180s recently.    Family medical social history as been reviewed her previous progress notes including 05/05/2014 as well as 06/09/2014.   Medications have been reviewed per MAR.   Review of systems.  In general he is not complaining of fever chills.  Head ears eyes nose mouth and throat-he is legally blind is not complaining of any sore throat nasal discharge.  Respiratory history of severe COPD with somewhat chronic complaints of shortness of breath-O2 sats continued to be satisfactory.  Heart does not complaining of chest pain and some mild lower extremity edema which appears to be baseline.  GI is not complaining of any abdominal discomfort nausea or vomiting -apparently is having regular bowel movements --he does have chronic distention  Muscle skeletal does not complaining of joint pain.  Neurologic he is not complaining of any dizziness  .  Physical exam.   Temperature 97.9 pulse 85 respirations 20 blood pressure 136/70 weight appears stable at 189.4 O2 saturation is 90% on oxygen.  In general this is a fairly well-nourished elderly male in no distress sitting comfortably in his wheelchair .  His skin is warm and dry.   Chest has shallow air entry -- his breathing is nonlabored I do not really hear congestion this evening.  Heart is distant heart sounds regular rate and rhythm-he has fairly mild trace lower extremity edema bilaterally which appears  to be baseline.  His abdomen continues to be distended but soft nontender with active bowel sounds.  Muscle skeletal moves all extremities x4 ambulates in a wheelchair during the day I do not any deformities.  Neurologic appears grossly intact his speech is clear.  Psych he is oriented to self pleasant and follows simple verbal commands he is conversant .  Labs 06/28/2014.  Sodium 146 potassium 4.9 BUN 22 creatinine 1.25 CO2  41.  06/14/2014.  WBC 9.5 hemoglobin 11.2 platelets 235.  06/16/2014.  Depakote 57.1.  October first 2015.  Liver function tests within normal limits except bilirubin 0.1 and albumin of 3.0.  .  05/17/2014.  Sodium 144 potassium 4.9 BUN 19 creatinine 0.96 CO2 level of 37 which appears to be on the lower end of his baseline.  BMP-82.7.  05/06/2014.  WBC 6.9 hemoglobin 10.3 platelets 227.  Sodium 141 potassium 4.8 BUN 19 creatinine 0.93 CO2 level at that time was 39.  AST 77 phosphatase 63 ALT 7.  On the 17th 2015.  Triglycerides 207 HDL 37 LDL 117.  TSH-2.209.  Vitamin B12 524.  Hemoglobin A1c-6.2   Assessment and plan.  #1-history of severe COPD-this appears relatively stable --He is on routine nebulizers-we'll update a metabolic panel recent CO2 level was 41 it's appears to be somewhat on the higher end of his baseline #2 diabetes type 2  on Lantus as well as Glucophage hemoglobin A1c recently was satisfactory at 6.2.  Some concern with CBGs in the 80s will reduce his Lantus to 5 units and monitor    #3-abdominal distention-this appears to be chronic x-ray was reassuring clinically he appears to be stable at this point we'll monitor.  4-history of dementia-this appears relatively baseline he appears to be doing well in this setting he continues on Aricept.  #5-history of seizure disorder he is on Depakote as well as Keppra this is been stable--- will update Depakote level #5-history anemia this has been stable we'll update CBC--he is on iron.  #6-history of glaucoma-he continues on topical eyedrops this is followed by ophthalmology.  #7-hyperlipidemia-this appears to be relatively stable considering patient's comorbidities and advanced age somewhat conservative here-I do note AST was mildly elevated on recent lab but this has normalized on the most recent lab #8--- history of hypertension-has somewhat variable systolics -- check blood pressures daily with a log for review  in provider box I do not see consistent elevations -- manual reading was fairly satisfactory but will have to be monitored.  #9-history of depression he continues on Celexa this appears to be stable appetite appears to be good again he appears to be doing well in this setting  #10-. History of anxiety-she does continue on lorazepam twice a day-this appears to be relatively well controlled.   [id number]

## 2014-07-04 LAB — GLUCOSE, CAPILLARY
GLUCOSE-CAPILLARY: 114 mg/dL — AB (ref 70–99)
Glucose-Capillary: 163 mg/dL — ABNORMAL HIGH (ref 70–99)

## 2014-07-05 LAB — GLUCOSE, CAPILLARY
Glucose-Capillary: 96 mg/dL (ref 70–99)
Glucose-Capillary: 192 mg/dL — ABNORMAL HIGH (ref 70–99)

## 2014-07-06 LAB — GLUCOSE, CAPILLARY
GLUCOSE-CAPILLARY: 102 mg/dL — AB (ref 70–99)
Glucose-Capillary: 175 mg/dL — ABNORMAL HIGH (ref 70–99)

## 2014-07-07 LAB — GLUCOSE, CAPILLARY
Glucose-Capillary: 100 mg/dL — ABNORMAL HIGH (ref 70–99)
Glucose-Capillary: 106 mg/dL — ABNORMAL HIGH (ref 70–99)

## 2014-07-09 ENCOUNTER — Inpatient Hospital Stay (HOSPITAL_COMMUNITY)
Admission: EM | Admit: 2014-07-09 | Discharge: 2014-08-10 | DRG: 871 | Disposition: E | Payer: Medicare Other | Attending: Family Medicine | Admitting: Family Medicine

## 2014-07-09 ENCOUNTER — Emergency Department (HOSPITAL_COMMUNITY): Payer: Medicare Other

## 2014-07-09 ENCOUNTER — Encounter (HOSPITAL_COMMUNITY): Payer: Self-pay | Admitting: Emergency Medicine

## 2014-07-09 DIAGNOSIS — R918 Other nonspecific abnormal finding of lung field: Secondary | ICD-10-CM | POA: Diagnosis present

## 2014-07-09 DIAGNOSIS — Z9119 Patient's noncompliance with other medical treatment and regimen: Secondary | ICD-10-CM | POA: Diagnosis present

## 2014-07-09 DIAGNOSIS — F05 Delirium due to known physiological condition: Secondary | ICD-10-CM | POA: Diagnosis present

## 2014-07-09 DIAGNOSIS — R0689 Other abnormalities of breathing: Secondary | ICD-10-CM | POA: Diagnosis present

## 2014-07-09 DIAGNOSIS — J449 Chronic obstructive pulmonary disease, unspecified: Secondary | ICD-10-CM | POA: Diagnosis present

## 2014-07-09 DIAGNOSIS — E119 Type 2 diabetes mellitus without complications: Secondary | ICD-10-CM | POA: Diagnosis present

## 2014-07-09 DIAGNOSIS — Z801 Family history of malignant neoplasm of trachea, bronchus and lung: Secondary | ICD-10-CM | POA: Diagnosis not present

## 2014-07-09 DIAGNOSIS — J9602 Acute respiratory failure with hypercapnia: Secondary | ICD-10-CM | POA: Diagnosis present

## 2014-07-09 DIAGNOSIS — G40909 Epilepsy, unspecified, not intractable, without status epilepticus: Secondary | ICD-10-CM | POA: Diagnosis present

## 2014-07-09 DIAGNOSIS — R001 Bradycardia, unspecified: Secondary | ICD-10-CM | POA: Diagnosis present

## 2014-07-09 DIAGNOSIS — R0602 Shortness of breath: Secondary | ICD-10-CM

## 2014-07-09 DIAGNOSIS — K219 Gastro-esophageal reflux disease without esophagitis: Secondary | ICD-10-CM | POA: Diagnosis present

## 2014-07-09 DIAGNOSIS — Z66 Do not resuscitate: Secondary | ICD-10-CM | POA: Diagnosis present

## 2014-07-09 DIAGNOSIS — F22 Delusional disorders: Secondary | ICD-10-CM

## 2014-07-09 DIAGNOSIS — Z794 Long term (current) use of insulin: Secondary | ICD-10-CM

## 2014-07-09 DIAGNOSIS — H409 Unspecified glaucoma: Secondary | ICD-10-CM | POA: Diagnosis present

## 2014-07-09 DIAGNOSIS — G4733 Obstructive sleep apnea (adult) (pediatric): Secondary | ICD-10-CM | POA: Diagnosis present

## 2014-07-09 DIAGNOSIS — Z515 Encounter for palliative care: Secondary | ICD-10-CM

## 2014-07-09 DIAGNOSIS — I509 Heart failure, unspecified: Secondary | ICD-10-CM | POA: Diagnosis present

## 2014-07-09 DIAGNOSIS — G9341 Metabolic encephalopathy: Secondary | ICD-10-CM | POA: Diagnosis present

## 2014-07-09 DIAGNOSIS — D638 Anemia in other chronic diseases classified elsewhere: Secondary | ICD-10-CM | POA: Diagnosis present

## 2014-07-09 DIAGNOSIS — J189 Pneumonia, unspecified organism: Secondary | ICD-10-CM

## 2014-07-09 DIAGNOSIS — H9190 Unspecified hearing loss, unspecified ear: Secondary | ICD-10-CM | POA: Diagnosis present

## 2014-07-09 DIAGNOSIS — E872 Acidosis: Secondary | ICD-10-CM | POA: Insufficient documentation

## 2014-07-09 DIAGNOSIS — Z803 Family history of malignant neoplasm of breast: Secondary | ICD-10-CM

## 2014-07-09 DIAGNOSIS — E869 Volume depletion, unspecified: Secondary | ICD-10-CM | POA: Diagnosis present

## 2014-07-09 DIAGNOSIS — R1310 Dysphagia, unspecified: Secondary | ICD-10-CM | POA: Diagnosis present

## 2014-07-09 DIAGNOSIS — A419 Sepsis, unspecified organism: Principal | ICD-10-CM | POA: Diagnosis present

## 2014-07-09 DIAGNOSIS — E785 Hyperlipidemia, unspecified: Secondary | ICD-10-CM | POA: Diagnosis present

## 2014-07-09 DIAGNOSIS — T501X5A Adverse effect of loop [high-ceiling] diuretics, initial encounter: Secondary | ICD-10-CM | POA: Diagnosis present

## 2014-07-09 DIAGNOSIS — F0390 Unspecified dementia without behavioral disturbance: Secondary | ICD-10-CM | POA: Diagnosis present

## 2014-07-09 DIAGNOSIS — J690 Pneumonitis due to inhalation of food and vomit: Secondary | ICD-10-CM | POA: Diagnosis present

## 2014-07-09 DIAGNOSIS — H5442 Blindness, left eye, normal vision right eye: Secondary | ICD-10-CM | POA: Diagnosis present

## 2014-07-09 DIAGNOSIS — J9622 Acute and chronic respiratory failure with hypercapnia: Secondary | ICD-10-CM | POA: Diagnosis present

## 2014-07-09 DIAGNOSIS — Y95 Nosocomial condition: Secondary | ICD-10-CM

## 2014-07-09 DIAGNOSIS — Z87891 Personal history of nicotine dependence: Secondary | ICD-10-CM

## 2014-07-09 DIAGNOSIS — Z79899 Other long term (current) drug therapy: Secondary | ICD-10-CM | POA: Diagnosis not present

## 2014-07-09 DIAGNOSIS — I10 Essential (primary) hypertension: Secondary | ICD-10-CM | POA: Diagnosis present

## 2014-07-09 DIAGNOSIS — F0392 Unspecified dementia, unspecified severity, with psychotic disturbance: Secondary | ICD-10-CM | POA: Diagnosis present

## 2014-07-09 DIAGNOSIS — E8729 Other acidosis: Secondary | ICD-10-CM | POA: Insufficient documentation

## 2014-07-09 HISTORY — DX: Other nonspecific abnormal finding of lung field: R91.8

## 2014-07-09 HISTORY — DX: Unspecified infectious disease: B99.9

## 2014-07-09 HISTORY — DX: Unspecified infectious disease: G93.49

## 2014-07-09 HISTORY — DX: Hypokalemia: E87.6

## 2014-07-09 HISTORY — DX: Shortness of breath: R06.02

## 2014-07-09 HISTORY — DX: Unspecified glaucoma: H40.9

## 2014-07-09 HISTORY — DX: Hypoxemia: R09.02

## 2014-07-09 HISTORY — DX: Elevated white blood cell count, unspecified: D72.829

## 2014-07-09 HISTORY — DX: Chronic obstructive pulmonary disease, unspecified: J44.9

## 2014-07-09 HISTORY — DX: Other abnormalities of breathing: R06.89

## 2014-07-09 LAB — CBC WITH DIFFERENTIAL/PLATELET
BASOS ABS: 0 10*3/uL (ref 0.0–0.1)
BASOS PCT: 0 % (ref 0–1)
EOS PCT: 1 % (ref 0–5)
Eosinophils Absolute: 0.1 10*3/uL (ref 0.0–0.7)
HEMATOCRIT: 38.6 % — AB (ref 39.0–52.0)
HEMOGLOBIN: 10.8 g/dL — AB (ref 13.0–17.0)
LYMPHS PCT: 9 % — AB (ref 12–46)
Lymphs Abs: 1.3 10*3/uL (ref 0.7–4.0)
MCH: 27.6 pg (ref 26.0–34.0)
MCHC: 28 g/dL — AB (ref 30.0–36.0)
MCV: 98.7 fL (ref 78.0–100.0)
MONOS PCT: 11 % (ref 3–12)
Monocytes Absolute: 1.6 10*3/uL — ABNORMAL HIGH (ref 0.1–1.0)
NEUTROS ABS: 12 10*3/uL — AB (ref 1.7–7.7)
Neutrophils Relative %: 79 % — ABNORMAL HIGH (ref 43–77)
Platelets: 219 10*3/uL (ref 150–400)
RBC: 3.91 MIL/uL — ABNORMAL LOW (ref 4.22–5.81)
RDW: 16.6 % — ABNORMAL HIGH (ref 11.5–15.5)
WBC: 15 10*3/uL — ABNORMAL HIGH (ref 4.0–10.5)

## 2014-07-09 LAB — BLOOD GAS, ARTERIAL
ACID-BASE EXCESS: 13.9 mmol/L — AB (ref 0.0–2.0)
Acid-Base Excess: 12.9 mmol/L — ABNORMAL HIGH (ref 0.0–2.0)
Bicarbonate: 40.5 mEq/L — ABNORMAL HIGH (ref 20.0–24.0)
Bicarbonate: 40.8 mEq/L — ABNORMAL HIGH (ref 20.0–24.0)
DELIVERY SYSTEMS: POSITIVE
DRAWN BY: 38235
Delivery systems: POSITIVE
Drawn by: 105551
EXPIRATORY PAP: 7
Expiratory PAP: 8
FIO2: 1 %
FIO2: 35 %
FIO2: 0.35 %
Inspiratory PAP: 14
Inspiratory PAP: 19
O2 SAT: 99.2 %
O2 SAT: 91.3 %
O2 Saturation: 91.1 %
PH ART: 7.237 — AB (ref 7.350–7.450)
PO2 ART: 67.8 mmHg — AB (ref 80.0–100.0)
Patient temperature: 37
Patient temperature: 37
Patient temperature: 37
RATE: 15 resp/min
RATE: 18 resp/min
TCO2: 39 mmol/L (ref 0–100)
TCO2: 39 mmol/L (ref 0–100)
pCO2 arterial: 98.6 mmHg (ref 35.0–45.0)
pCO2 arterial: 86.7 mmHg (ref 35.0–45.0)
pH, Arterial: 7.142 — CL (ref 7.350–7.450)
pH, Arterial: 7.294 — ABNORMAL LOW (ref 7.350–7.450)
pO2, Arterial: 300 mmHg — ABNORMAL HIGH (ref 80.0–100.0)
pO2, Arterial: 65.9 mmHg — ABNORMAL LOW (ref 80.0–100.0)

## 2014-07-09 LAB — I-STAT CG4 LACTIC ACID, ED: LACTIC ACID, VENOUS: 0.8 mmol/L (ref 0.5–2.2)

## 2014-07-09 LAB — COMPREHENSIVE METABOLIC PANEL
ALT: 14 U/L (ref 0–53)
AST: 17 U/L (ref 0–37)
Albumin: 3.3 g/dL — ABNORMAL LOW (ref 3.5–5.2)
Alkaline Phosphatase: 81 U/L (ref 39–117)
Anion gap: 8 (ref 5–15)
BUN: 24 mg/dL — AB (ref 6–23)
CALCIUM: 10.2 mg/dL (ref 8.4–10.5)
CO2: 38 meq/L — AB (ref 19–32)
CREATININE: 1.13 mg/dL (ref 0.50–1.35)
Chloride: 96 mEq/L (ref 96–112)
GFR calc Af Amer: 68 mL/min — ABNORMAL LOW (ref >90)
GFR calc non Af Amer: 59 mL/min — ABNORMAL LOW (ref >90)
Glucose, Bld: 156 mg/dL — ABNORMAL HIGH (ref 70–99)
Potassium: 5.6 mEq/L — ABNORMAL HIGH (ref 3.7–5.3)
Sodium: 142 mEq/L (ref 137–147)
Total Bilirubin: 0.2 mg/dL — ABNORMAL LOW (ref 0.3–1.2)
Total Protein: 8.1 g/dL (ref 6.0–8.3)

## 2014-07-09 LAB — TROPONIN I

## 2014-07-09 LAB — GLUCOSE, CAPILLARY
GLUCOSE-CAPILLARY: 94 mg/dL (ref 70–99)
GLUCOSE-CAPILLARY: 100 mg/dL — AB (ref 70–99)
Glucose-Capillary: 93 mg/dL (ref 70–99)

## 2014-07-09 LAB — MRSA PCR SCREENING: MRSA by PCR: NEGATIVE

## 2014-07-09 LAB — PRO B NATRIURETIC PEPTIDE: PRO B NATRI PEPTIDE: 196.8 pg/mL (ref 0–450)

## 2014-07-09 MED ORDER — CETYLPYRIDINIUM CHLORIDE 0.05 % MT LIQD
7.0000 mL | Freq: Two times a day (BID) | OROMUCOSAL | Status: DC
  Administered 2014-07-09: 7 mL via OROMUCOSAL

## 2014-07-09 MED ORDER — CETYLPYRIDINIUM CHLORIDE 0.05 % MT LIQD
7.0000 mL | Freq: Two times a day (BID) | OROMUCOSAL | Status: DC
  Administered 2014-07-10 – 2014-07-11 (×2): 7 mL via OROMUCOSAL

## 2014-07-09 MED ORDER — INSULIN ASPART 100 UNIT/ML ~~LOC~~ SOLN
0.0000 [IU] | Freq: Three times a day (TID) | SUBCUTANEOUS | Status: DC
Start: 2014-07-09 — End: 2014-07-10

## 2014-07-09 MED ORDER — PIPERACILLIN-TAZOBACTAM 3.375 G IVPB 30 MIN
3.3750 g | Freq: Once | INTRAVENOUS | Status: AC
  Administered 2014-07-09: 3.375 g via INTRAVENOUS
  Filled 2014-07-09: qty 50

## 2014-07-09 MED ORDER — CHLORHEXIDINE GLUCONATE 0.12 % MT SOLN
15.0000 mL | Freq: Two times a day (BID) | OROMUCOSAL | Status: DC
  Administered 2014-07-09 – 2014-07-11 (×4): 15 mL via OROMUCOSAL
  Filled 2014-07-09 (×4): qty 15

## 2014-07-09 MED ORDER — INSULIN GLARGINE 100 UNIT/ML ~~LOC~~ SOLN
5.0000 [IU] | Freq: Every day | SUBCUTANEOUS | Status: DC
  Administered 2014-07-09: 5 [IU] via SUBCUTANEOUS
  Filled 2014-07-09 (×3): qty 0.05

## 2014-07-09 MED ORDER — IPRATROPIUM-ALBUTEROL 0.5-2.5 (3) MG/3ML IN SOLN
3.0000 mL | RESPIRATORY_TRACT | Status: DC
  Administered 2014-07-09 – 2014-07-10 (×9): 3 mL via RESPIRATORY_TRACT
  Filled 2014-07-09 (×9): qty 3

## 2014-07-09 MED ORDER — DORZOLAMIDE HCL-TIMOLOL MAL 2-0.5 % OP SOLN
1.0000 [drp] | Freq: Two times a day (BID) | OPHTHALMIC | Status: DC
  Administered 2014-07-09 – 2014-07-11 (×5): 1 [drp] via OPHTHALMIC
  Filled 2014-07-09: qty 10

## 2014-07-09 MED ORDER — SODIUM CHLORIDE 0.9 % IJ SOLN: 3.0000 mL | Freq: Two times a day (BID) | INTRAMUSCULAR | Status: DC

## 2014-07-09 MED ORDER — HEPARIN SODIUM (PORCINE) 5000 UNIT/ML IJ SOLN
5000.0000 [IU] | Freq: Three times a day (TID) | INTRAMUSCULAR | Status: DC
  Administered 2014-07-09 – 2014-07-10 (×4): 5000 [IU] via SUBCUTANEOUS
  Filled 2014-07-09 (×4): qty 1

## 2014-07-09 MED ORDER — PIPERACILLIN-TAZOBACTAM 3.375 G IVPB
3.3750 g | Freq: Three times a day (TID) | INTRAVENOUS | Status: DC
  Administered 2014-07-09 – 2014-07-10 (×3): 3.375 g via INTRAVENOUS
  Filled 2014-07-09 (×10): qty 50

## 2014-07-09 MED ORDER — CHLORHEXIDINE GLUCONATE 0.12 % MT SOLN: 15.0000 mL | Freq: Two times a day (BID) | OROMUCOSAL | Status: DC

## 2014-07-09 MED ORDER — FUROSEMIDE 10 MG/ML IJ SOLN
40.0000 mg | Freq: Two times a day (BID) | INTRAMUSCULAR | Status: DC
  Administered 2014-07-09 (×2): 40 mg via INTRAVENOUS
  Filled 2014-07-09 (×2): qty 4

## 2014-07-09 MED ORDER — VANCOMYCIN HCL IN DEXTROSE 750-5 MG/150ML-% IV SOLN
750.0000 mg | Freq: Two times a day (BID) | INTRAVENOUS | Status: DC
  Administered 2014-07-09 – 2014-07-10 (×2): 750 mg via INTRAVENOUS
  Filled 2014-07-09 (×6): qty 150

## 2014-07-09 MED ORDER — LORAZEPAM 0.5 MG PO TABS
0.5000 mg | ORAL_TABLET | Freq: Four times a day (QID) | ORAL | Status: DC | PRN
  Administered 2014-07-09: 0.5 mg via ORAL
  Filled 2014-07-09 (×2): qty 1

## 2014-07-09 MED ORDER — VANCOMYCIN HCL IN DEXTROSE 1-5 GM/200ML-% IV SOLN
1000.0000 mg | Freq: Once | INTRAVENOUS | Status: AC
  Administered 2014-07-09: 1000 mg via INTRAVENOUS
  Filled 2014-07-09: qty 200

## 2014-07-09 NOTE — Clinical Social Work Psychosocial (Signed)
Clinical Social Work Department BRIEF PSYCHOSOCIAL ASSESSMENT 06/13/2014  Patient:  [name] [name], [name] [name]     Account Number:  [id number]     Admit date:  06/23/2014  Clinical Social Worker:  Wyatt Haste  Date/Time:  06/10/2014 10:45 AM  Referred by:  CSW  Date Referred:  06/29/2014 Referred for  SNF Placement   Other Referral:   Interview type:  Family Other interview type:   multiple daughters/sons    PSYCHOSOCIAL DATA Living Status:  FACILITY Admitted from facility:  [facility] Level of care:  [place] Primary support name:  Rosa/Teresa/Wendy/Sandra Primary support relationship to patient:  CHILD, ADULT Degree of support available:   supportive    CURRENT CONCERNS Current Concerns  Post-Acute Placement   Other Concerns:    SOCIAL WORK ASSESSMENT / PLAN CSW met with several of pt's children at bedside. Pt has been a resident at [facility] since July. He has 8 children who are very involved and supportive and visit daily. Family report very positive experience at [facility]. Pt is in ICU. MD discussed condition with family and they are open to hospice if pt does not improve next 24-48 hours. CSW discussed different options for hospice and they prefer return to [facility] with hospice following. Brief support provided during conversation. They request CSW notify [facility] of possibility of hospice. CSW discussed with Marianna Fuss at [facility]. She reports pt is nursing level of care and agreeable to return with hospice, possibly over weekend. No FL2 needed.   Assessment/plan status:  Psychosocial Support/Ongoing Assessment of Needs Other assessment/ plan:   Information/referral to community resources:   [facility]  Possibly hospice    PATIENT'S/FAMILY'S RESPONSE TO PLAN OF CARE: Pt unable to participate in assessment. Family are all agreeable with plan to return to [facility] with hospice if no improvement.       Benay Pike, [place]

## 2014-07-09 NOTE — ED Notes (Signed)
Per EMS, patient is a resident at [facility].  Per [facility], patient is on BIPAP at night; patient began having low O2 sats in the 60s.  Patient sent to ED for further evaluation.  Per nursing staff, patient had CO2 of 40 yesterday.  Patient is DNR.  EMS states patient was lying flat, EMS states they sat him up and O2 sats increased to 100%.

## 2014-07-09 NOTE — ED Provider Notes (Signed)
CSN: [id number]     Arrival date & time 06/25/2014  0325 History   First MD Initiated Contact with Patient 06/12/2014 0335     No chief complaint on file.    (Consider location/radiation/quality/duration/timing/severity/associated sxs/prior Treatment) HPI Patient presents from nursing home with history of COPD. Patient is unable to give history due to chronic dementia. Level V caveat applies. Patient on BiPAP at night. Noted to have oxygen saturations in the 60s. EMS was called. Patient was sat upright with increased saturations to the 100s. Patient is now currently on a nonrebreather.  Past Medical History  Diagnosis Date  . Glaucoma   . Anxiety   . GERD (gastroesophageal reflux disease)   . Hyperlipidemia   . Hypertension   . Chronic abdominal pain   . 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 Chronic respiratory failure   . Altered mental status 06/12/2012; 08/14/2012    Unwitnessed isolated seizure suspected, but because of the EEG being normal, Dr. Merlene Laughter did not recommend antiseizure therapy.; "violent since 08/13/2012" (08/14/2012)  . Hard of hearing   . Seizure 06/14/2012    Isolated seizure suspected, but not confirmed.  . Bradycardia   . Complication of anesthesia 2005    "couldn't get him woke up when he should have after eye surgery" (08/14/2012)  . Mesothelioma   . Pleural plaque 04/2012  . Exertional dyspnea   . DM type 2 (diabetes mellitus, type 2) 06/13/2012  . UTI (lower urinary tract infection)   . COPD (chronic obstructive pulmonary disease)   . Multiple lung nodules   . CO2 narcosis   . Shortness of breath   . Leukocytosis   . Infectious encephalopathy     UTI related  . Hypoxia   . Hypokalemia   . Glaucoma   . Hyperlipidemia    Past Surgical History  Procedure Laterality Date  . Inguinal hernia repair    . Prostate surgery    . Left eye surgery for glaucoma at [facility]  10/2009  . Cataract extraction w/ intraocular lens implant  2005     "left" (08/14/2012)   Family History  Problem Relation Age of Onset  . Lung cancer Sister   . Breast cancer Sister    History  Substance Use Topics  . Smoking status: Former Smoker -- 2.00 packs/day for 40 years    Types: Cigarettes  . Smokeless tobacco: Never Used     Comment: "quit smoking cigarettes ~ 2001"  . Alcohol Use: Yes     Comment: 08/14/2012 "used to drink heavily; stopped ~ 2001"    Review of Systems  Unable to perform ROS: Dementia      Allergies  Review of patient's allergies indicates no known allergies.  Home Medications   Prior to Admission medications   Medication Sig Start Date End Date Taking? Authorizing Provider  acetaminophen (TYLENOL) 650 MG CR tablet Take 650 mg by mouth 2 (two) times daily. 2 by mouth every 4 hours as needed for pain or temp   Yes Historical Provider, MD  alum & mag hydroxide-simeth (MYLANTA) 200-200-20 MG/5ML suspension Take 30 mLs by mouth every 4 (four) hours as needed for indigestion or heartburn.   Yes Historical Provider, MD  amLODipine (NORVASC) 5 MG tablet Take 1 tablet (5 mg total) by mouth daily. 08/26/12  Yes Modena Jansky, MD  bisacodyl (DULCOLAX) 10 MG suppository Place 10 mg rectally daily as needed for moderate constipation.   Yes Historical Provider, MD  brimonidine [facility]  P) 0.1 % SOLN Place 1 drop into both eyes 2 (two) times daily.    Yes Historical Provider, MD  citalopram (CELEXA) 10 MG tablet Take 10 mg by mouth daily.   Yes Historical Provider, MD  divalproex (DEPAKOTE SPRINKLE) 125 MG capsule Take 500 mg by mouth 3 (three) times daily.   Yes Historical Provider, MD  donepezil (ARICEPT) 10 MG tablet Take 10 mg by mouth at bedtime.   Yes Historical Provider, MD  Dorzolamide HCl-Timolol Mal PF 22.3-6.8 MG/ML SOLN Place 1 drop into the right eye 2 (two) times daily.    Yes Historical Provider, MD  ferrous sulfate 325 (65 FE) MG EC tablet Take 325 mg by mouth 2 (two) times daily.   Yes Historical Provider, MD   insulin glargine (LANTUS) 100 UNIT/ML injection Inject 5 Units into the skin at bedtime.  03/30/14  Yes Kathie Dike, MD  ipratropium-albuterol (DUONEB) 0.5-2.5 (3) MG/3ML SOLN Take 3 mLs by nebulization 4 (four) times daily. 03/30/14  Yes Kathie Dike, MD  latanoprost (XALATAN) 0.005 % ophthalmic solution Place 1 drop into the right eye at bedtime.    Yes Historical Provider, MD  levETIRAcetam (KEPPRA) 250 MG tablet Take 250 mg by mouth 2 (two) times daily.   Yes Historical Provider, MD  loratadine (CLARITIN) 10 MG tablet Take 10 mg by mouth daily.   Yes Historical Provider, MD  LORazepam (ATIVAN) 0.5 MG tablet 1 mg. Take one tablet by mouth twice daily for anxiety 03/31/14  Yes Estill Dooms, MD  LORazepam (ATIVAN) 1 MG tablet Take 1 tablet (1 mg total) by mouth at bedtime. 03/31/14  Yes Estill Dooms, MD  lovastatin (MEVACOR) 40 MG tablet Take 40 mg by mouth at bedtime.     Yes Historical Provider, MD  metFORMIN (GLUCOPHAGE) 500 MG tablet Take 500 mg by mouth 2 (two) times daily with a meal.   Yes Historical Provider, MD  montelukast (SINGULAIR) 10 MG tablet Take 10 mg by mouth every other day.    Yes Historical Provider, MD  omeprazole (PRILOSEC) 20 MG capsule Take 20 mg by mouth daily.   Yes Historical Provider, MD  pilocarpine (PILOCAR) 1 % ophthalmic solution Place 1 drop into the right eye 4 (four) times daily.  07/14/12  Yes Historical Provider, MD  polyethylene glycol (MIRALAX / GLYCOLAX) packet Take 17 g by mouth daily. 08/26/12  Yes Modena Jansky, MD  senna-docusate (DOC-Q-LAX) 8.6-50 MG per tablet Take 1 tablet by mouth at bedtime as needed.    Yes Historical Provider, MD  vitamin C (ASCORBIC ACID) 500 MG tablet Take 500 mg by mouth 2 (two) times daily.   Yes Historical Provider, MD  antiseptic oral rinse (BIOTENE) LIQD 15 mLs by Mouth Rinse route 2 times daily at 12 noon and 4 pm. 02/27/14   Maryann Mikhail, DO   BP 123/67  Pulse 103  Resp 24  SpO2 95% Physical Exam  Nursing  note and vitals reviewed. Constitutional: He appears well-developed and well-nourished. No distress.  HENT:  Head: Normocephalic and atraumatic.  Mouth/Throat: Oropharynx is clear and moist.  Eyes: EOM are normal. Pupils are equal, round, and reactive to light.  Neck: Normal range of motion. Neck supple.  Cardiovascular: Normal rate and regular rhythm.   Pulmonary/Chest: No respiratory distress. He has no wheezes. He has rales (Rales throughout).  Increased respiratory effort  Abdominal: Soft. Bowel sounds are normal. He exhibits distension. He exhibits no mass. There is no tenderness. There is no rebound and  no guarding.  Musculoskeletal: Normal range of motion. He exhibits no edema and no tenderness.  No calf swelling or tenderness.  Neurological:  Drowsy. Will awaken to voice. Non-comprehensible sounds.  Skin: Skin is warm and dry. No rash noted. No erythema.  Psychiatric: He has a normal mood and affect. His behavior is normal.    ED Course  Procedures (including critical care time) Labs Review Labs Reviewed  CBC WITH DIFFERENTIAL - Abnormal; Notable for the following:    WBC 15.0 (*)    RBC 3.91 (*)    Hemoglobin 10.8 (*)    HCT 38.6 (*)    MCHC 28.0 (*)    RDW 16.6 (*)    Neutrophils Relative % 79 (*)    Neutro Abs 12.0 (*)    Lymphocytes Relative 9 (*)    Monocytes Absolute 1.6 (*)    All other components within normal limits  BLOOD GAS, ARTERIAL - Abnormal; Notable for the following:    pH, Arterial 7.142 (*)    pO2, Arterial 300.0 (*)    All other components within normal limits  TROPONIN I  COMPREHENSIVE METABOLIC PANEL  PRO B NATRIURETIC PEPTIDE    Imaging Review Dg Chest Port 1 View  06/29/2014   CLINICAL DATA:  Shortness of breath, history of COPD. Nursing home patient, with oxygen desaturation for 2 days.  EXAM: PORTABLE CHEST - 1 VIEW  COMPARISON:  Chest radiograph June 12, 2014  FINDINGS: The cardiac silhouette is upper limits of normal in size,  unchanged. Mediastinal silhouette is nonsuspicious. Increasing central pulmonary vascular congestion and mild interstitial prominence. Density along the LEFT pleural surface may reflect pleural plaques. Conspicuous apical bullous changes. Bibasilar strandy densities. No pleural effusions. No pneumothorax. Soft tissue planes and included osseous structures are unchanged.  IMPRESSION: Borderline cardiomegaly. Increasing central pulmonary vascular congestion and mild interstitial prominence suggests pulmonary edema, superimposed atypical infection not excluded.   Electronically Signed   By: Elon Alas   On: 06/12/2014 04:38     EKG Interpretation None      Date: 06/10/2014  Rate: 110  Rhythm: sinus tachycardia  QRS Axis: normal  Intervals: normal  ST/T Wave abnormalities: normal  Conduction Disutrbances:none  Narrative Interpretation:   Old EKG Reviewed: none available   MDM   Final diagnoses:  Shortness of breath   Patient placed on BiPAP. Vital signs remained stable. Low-grade temperature in the emergency department. Infiltrate seen on chest x-ray. Likely healthcare acquired pneumonia versus aspiration. We'll treat with broad-spectrum antibiotics. Discussed with patient's daughters. Confirmed patient does not want to be intubated or chest compressions in the event of cardiorespiratory collapse. Discussed with Dr. Shanon Brow. Will admit to stepdown bed.     Julianne Rice, MD 07/10/2014 [phone]

## 2014-07-09 NOTE — H&P (Addendum)
[facility] History and Physical  [name] [name] RJJ:[id number] DOB: [birth year] DOA: 06/22/2014  Referring physician: ED PCP: [name], MD  Specialists: None  Chief Complaint:  COPD  HPI:  80 y/o ?, recent admit 03/30/14, 0/16/01 Toxic metabolic encephalopathy 2/2 to Hypoxic resp failure [non-compliant with Bipap], DM ty II, Severe dementia, Dysphagia, h/o COPD, L eye blindness, episodic seizure, Pleural nodules, [facility] a.m. with increasing shortness of breath/confusion. He is unable to give any meaningful history Patient at baseline is able to discuss with family and is oriented. He has good faculties but is blind in left eye and is very hard of hearing and needs to hearing aids. The diagnosis of dementia is somewhat questionable from family standpoint. He usually is on oxygen and has been for a long time and is compliant with BiPAP at night. He started having labored breathing per report from family. He told his daughter in the afternoon he could not breathe. He was given anxiolytic at the center and became a little groggy he was fed dinner but then at 3 AM this morning it was noted that his O2 sats on 60 percentile. Despite being DO NOT RESUSCITATE, he was sent over to the emergency room for an evaluation.  ABG showed pH 7.14, PCO2 on reportable, PO2 300. Sodium 142 potassium 5.6 CO2 38, BUN 24/creatinine 1.13 and about baseline, LFTs normal Troponin point-of-care less than 0.30, BNP 196, lactic acid 0.8 B 15, hemoglobin 10.8 Chest x-ray showed borderline cardiomegaly with increased central pulmonary vascular congestion and interstitial prominence suggesting edema, infection not excluded   Review of Systems: The patient is unable to give history  Past Medical History  Diagnosis Date  . Glaucoma   . Anxiety   . GERD (gastroesophageal reflux disease)   . Hyperlipidemia   . Hypertension   . Chronic abdominal pain   . Senile dementia with  delusional or depressive features   . Chronic respiratory failure   . Altered mental status 06/12/2012; 08/14/2012    Unwitnessed isolated seizure suspected, but because of the EEG being normal, Dr. Merlene Laughter did not recommend antiseizure therapy.; "violent since 08/13/2012" (08/14/2012)  . Hard of hearing   . Seizure 06/14/2012    Isolated seizure suspected, but not confirmed.  . Bradycardia   . Complication of anesthesia 2005    "couldn't get him woke up when he should have after eye surgery" (08/14/2012)  . Mesothelioma   . Pleural plaque 04/2012  . Exertional dyspnea   . DM type 2 (diabetes mellitus, type 2) 06/13/2012  . UTI (lower urinary tract infection)   . COPD (chronic obstructive pulmonary disease)   . Multiple lung nodules   . CO2 narcosis   . Shortness of breath   . Leukocytosis   . Infectious encephalopathy     UTI related  . Hypoxia   . Hypokalemia   . Glaucoma   . Hyperlipidemia    Past Surgical History  Procedure Laterality Date  . Inguinal hernia repair    . Prostate surgery    . Left eye surgery for glaucoma at [facility]  10/2009  . Cataract extraction w/ intraocular lens implant  2005    "left" (08/14/2012)   Social History:  History   Social History Narrative  . No narrative on file    No Known Allergies  Family History  Problem Relation Age of Onset  . Lung cancer Sister   . Breast cancer Sister     Prior  to Admission medications   Medication Sig Start Date End Date Taking? Authorizing Provider  acetaminophen (TYLENOL) 650 MG CR tablet Take 650 mg by mouth 2 (two) times daily. 2 by mouth every 4 hours as needed for pain or temp   Yes Historical Provider, MD  alum & mag hydroxide-simeth (MYLANTA) 200-200-20 MG/5ML suspension Take 30 mLs by mouth every 4 (four) hours as needed for indigestion or heartburn.   Yes Historical Provider, MD  amLODipine (NORVASC) 5 MG tablet Take 1 tablet (5 mg total) by mouth daily. 08/26/12  Yes Modena Jansky, MD   bisacodyl (DULCOLAX) 10 MG suppository Place 10 mg rectally daily as needed for moderate constipation.   Yes Historical Provider, MD  brimonidine (ALPHAGAN P) 0.1 % SOLN Place 1 drop into both eyes 2 (two) times daily.    Yes Historical Provider, MD  citalopram (CELEXA) 10 MG tablet Take 10 mg by mouth daily.   Yes Historical Provider, MD  divalproex (DEPAKOTE SPRINKLE) 125 MG capsule Take 500 mg by mouth 3 (three) times daily.   Yes Historical Provider, MD  donepezil (ARICEPT) 10 MG tablet Take 10 mg by mouth at bedtime.   Yes Historical Provider, MD  Dorzolamide HCl-Timolol Mal PF 22.3-6.8 MG/ML SOLN Place 1 drop into the right eye 2 (two) times daily.    Yes Historical Provider, MD  ferrous sulfate 325 (65 FE) MG EC tablet Take 325 mg by mouth 2 (two) times daily.   Yes Historical Provider, MD  insulin glargine (LANTUS) 100 UNIT/ML injection Inject 5 Units into the skin at bedtime.  03/30/14  Yes Kathie Dike, MD  ipratropium-albuterol (DUONEB) 0.5-2.5 (3) MG/3ML SOLN Take 3 mLs by nebulization 4 (four) times daily. 03/30/14  Yes Kathie Dike, MD  latanoprost (XALATAN) 0.005 % ophthalmic solution Place 1 drop into the right eye at bedtime.    Yes Historical Provider, MD  levETIRAcetam (KEPPRA) 250 MG tablet Take 250 mg by mouth 2 (two) times daily.   Yes Historical Provider, MD  loratadine (CLARITIN) 10 MG tablet Take 10 mg by mouth daily.   Yes Historical Provider, MD  LORazepam (ATIVAN) 0.5 MG tablet 1 mg. Take one tablet by mouth twice daily for anxiety 03/31/14  Yes Estill Dooms, MD  LORazepam (ATIVAN) 1 MG tablet Take 1 tablet (1 mg total) by mouth at bedtime. 03/31/14  Yes Estill Dooms, MD  lovastatin (MEVACOR) 40 MG tablet Take 40 mg by mouth at bedtime.     Yes Historical Provider, MD  metFORMIN (GLUCOPHAGE) 500 MG tablet Take 500 mg by mouth 2 (two) times daily with a meal.   Yes Historical Provider, MD  montelukast (SINGULAIR) 10 MG tablet Take 10 mg by mouth every other day.     Yes Historical Provider, MD  omeprazole (PRILOSEC) 20 MG capsule Take 20 mg by mouth daily.   Yes Historical Provider, MD  pilocarpine (PILOCAR) 1 % ophthalmic solution Place 1 drop into the right eye 4 (four) times daily.  07/14/12  Yes Historical Provider, MD  polyethylene glycol (MIRALAX / GLYCOLAX) packet Take 17 g by mouth daily. 08/26/12  Yes Modena Jansky, MD  senna-docusate (DOC-Q-LAX) 8.6-50 MG per tablet Take 1 tablet by mouth at bedtime as needed.    Yes Historical Provider, MD  vitamin C (ASCORBIC ACID) 500 MG tablet Take 500 mg by mouth 2 (two) times daily.   Yes Historical Provider, MD  antiseptic oral rinse (BIOTENE) LIQD 15 mLs by Mouth Rinse route 2 times daily  at 12 noon and 4 pm. 02/27/14   Cristal Ford, DO   Physical Exam: Filed Vitals:   06/16/2014 0530 06/23/2014 0600 06/30/2014 0649 06/21/2014 0700  BP: 132/87 138/89 126/79 117/60  Pulse: 107 106 104 101  Temp:   97.4 F (36.3 C)   TempSrc:   Axillary   Resp: 25 27 23 19   Height:   5\' 10"  (1.778 m)   Weight:   86 kg (189 lb 9.5 oz)   SpO2: 98% 95% 95% 93%     General:  Lethargic but arouses a little  But is non-coherent.  GCS=E3, V1, M4=8/15  Eyes: Noteataracts bilaterally or icterus  ENT: Soft supple slightly prominent thyroid  Neck: No JVD  Cardiovascular: S1-S2 tachycardic no murmurs noted but difficult exam as a bypass  Respiratory: Poor exam as on BiPAP and unable to cooperate.  Abdomen: Soft nontender nondistended  Skin: Trace lower extremity edema  Musculoskeletal: Range of motion cannot be ascertained  Psychiatric: Cannot be ascertained  Neurologic: Withdraws to pain  Labs on Admission:  Basic Metabolic Panel:  Recent Labs Lab 06/15/2014 0340  NA 142  K 5.6*  CL 96  CO2 38*  GLUCOSE 156*  BUN 24*  CREATININE 1.13  CALCIUM 10.2   Liver Function Tests:  Recent Labs Lab 06/29/2014 0340  AST 17  ALT 14  ALKPHOS 81  BILITOT 0.2*  PROT 8.1  ALBUMIN 3.3*   No results found for  this basename: LIPASE, AMYLASE,  in the last 168 hours No results found for this basename: AMMONIA,  in the last 168 hours CBC:  Recent Labs Lab 06/10/2014 0340  WBC 15.0*  NEUTROABS 12.0*  HGB 10.8*  HCT 38.6*  MCV 98.7  PLT 219   Cardiac Enzymes:  Recent Labs Lab 07/05/2014 0340  TROPONINI <0.30    BNP (last 3 results)  Recent Labs  03/23/14 1327 03/27/14 1322 07/08/2014 0340  PROBNP 293.3 530.9* 196.8   CBG:  Recent Labs Lab 07/05/14 2051 07/06/14 0610 07/06/14 2107 07/07/14 0513 07/07/14 2107  GLUCAP 192* 102* 175* 100* 106*    Radiological Exams on Admission: Dg Chest Port 1 View  06/26/2014   CLINICAL DATA:  Shortness of breath, history of COPD. Nursing home patient, with oxygen desaturation for 2 days.  EXAM: PORTABLE CHEST - 1 VIEW  COMPARISON:  Chest radiograph June 12, 2014  FINDINGS: The cardiac silhouette is upper limits of normal in size, unchanged. Mediastinal silhouette is nonsuspicious. Increasing central pulmonary vascular congestion and mild interstitial prominence. Density along the LEFT pleural surface may reflect pleural plaques. Conspicuous apical bullous changes. Bibasilar strandy densities. No pleural effusions. No pneumothorax. Soft tissue planes and included osseous structures are unchanged.  IMPRESSION: Borderline cardiomegaly. Increasing central pulmonary vascular congestion and mild interstitial prominence suggests pulmonary edema, superimposed atypical infection not excluded.   Electronically Signed   By: Elon Alas   On: 06/30/2014 04:38    EKG: Independently reviewed. Sinus tachycardia, PR=0.16, QRS axis 10 degrees, NO ST-T wave changes. Assessment/Plan Principal Problem:   Acute respiratory failure with hypercapnia-Patient admitted with  hypercarbic respiratory failure 2/2 to combination Pneumonia and new onset acute Heart failure as well as toxic metabolic encephalopathy. I , have had an extensive discussion with family.  I have  mentioned that although  on Bipap is a temporizing measure, He likely will again have a similar event in the future with concerns of his chronic dy. sphagia and underlying OSA/COPD.  We have discussed that it is very  reasonable to try Bipap overnight for an improvement in his general mentation, however he remains DNR and if no further improvmeent occurs or even in the setting of some minimal improvement in mental status, I would recommend him being followed by Hospice.  Rpt ABG 14:00.  Will give Lasix 40 mg IV bid and continue condom catheter   Sepsis 2/2 to Aspiration PNA- continue broad spectrum Vancomycin and Zosyn.  Reassess labs am    DM ty 2 on insulin--continue Lantus  5 mg daily at bedtime we will also place orders for every 4 hourly coverage.   Acute undifferentiated CHF-Would not obtain echo at this time.  Diurese with lasix as above   Presenile dementia with delusional features-for now hold off Aricept 10 mg, Depakote 500 3 times a day, Celexa 10 mg. If he awakens enough he may be able to take these.  I am unable to determine how confused he is at present time   GERD-Protonix IV   Multiple lung nodules-Needs OP reassesment   Bradycardia-stable and actually tachycardic 2/2 to PNA and sepsis   Tachycardia 2/2 to sepsis-place on PRN metoprolol for HR and BP as per MAR   CO2 narcosis-See above.  2/2 to combination COPD and OSA-Continue Duonebs q 4 hrly  Keep on BIpap overngiht   Encephalopathy, metabolic   Anemia of chronic disease  Time spent: 38 Discussed with HCPAO Abigail Butts and [place] [sister] bedside DNR, Gaurded prognosis  Nita Sells [facility] Pager [phone]   If 7PM-7AM, please contact night-coverage [email or web address] Password [email or web address] 06/19/2014, 7:18 AM

## 2014-07-09 NOTE — Progress Notes (Signed)
ANTIBIOTIC CONSULT NOTE  Pharmacy Consult for Vancomycin & Zosyn Indication: pneumonia  No Known Allergies  Patient Measurements: Height: 5\' 10"  (177.8 cm) Weight: 189 lb 9.5 oz (86 kg) IBW/kg (Calculated) : 73  Vital Signs: Temp: 99.9 F (37.7 C) (10/30 1142) Temp Source: Axillary (10/30 1142) BP: 95/61 mmHg (10/30 1200) Pulse Rate: 90 (10/30 1200) Intake/Output from previous day: 10/29 0701 - 10/30 0700 In: 250 [IV Piggyback:250] Out: -  Intake/Output from this shift:    Labs:  Recent Labs  06/23/2014 0340  WBC 15.0*  HGB 10.8*  PLT 219  CREATININE 1.13   Estimated Creatinine Clearance: 52.9 ml/min (by C-G formula based on Cr of 1.13). No results found for this basename: VANCOTROUGH, Corlis Leak, VANCORANDOM, GENTTROUGH, GENTPEAK, GENTRANDOM, TOBRATROUGH, TOBRAPEAK, TOBRARND, AMIKACINPEAK, AMIKACINTROU, AMIKACIN,  in the last 72 hours   Microbiology: Recent Results (from the past 720 hour(s))  CULTURE, BLOOD (ROUTINE X 2)     Status: None   Collection Time    07/01/2014  5:10 AM      Result Value Ref Range Status   Specimen Description BLOOD RIGHT ANTECUBITAL   Final   Special Requests     Final   Value: BOTTLES DRAWN AEROBIC AND ANAEROBIC AEB 6CC ANA 5CC   Culture NO GROWTH <24 HRS   Final   Report Status PENDING   Incomplete  CULTURE, BLOOD (ROUTINE X 2)     Status: None   Collection Time    07/01/2014  5:10 AM      Result Value Ref Range Status   Specimen Description BLOOD RIGHT HAND   Final   Special Requests BOTTLES DRAWN AEROBIC AND ANAEROBIC 4CC EACH   Final   Culture NO GROWTH <24 HRS   Final   Report Status PENDING   Incomplete  MRSA PCR SCREENING     Status: None   Collection Time    06/10/2014  6:30 AM      Result Value Ref Range Status   MRSA by PCR NEGATIVE  NEGATIVE Final   Comment:            The GeneXpert MRSA Assay (FDA     approved for NASAL specimens     only), is one component of a     comprehensive MRSA colonization     surveillance  program. It is not     intended to diagnose MRSA     infection nor to guide or     monitor treatment for     MRSA infections.    Anti-infectives   Start     Dose/Rate Route Frequency Ordered Stop   07/01/2014 1800  vancomycin (VANCOCIN) IVPB 750 mg/150 ml premix     750 mg 150 mL/hr over 60 Minutes Intravenous Every 12 hours 06/27/2014 1245     06/28/2014 1400  piperacillin-tazobactam (ZOSYN) IVPB 3.375 g     3.375 g 12.5 mL/hr over 240 Minutes Intravenous 3 times per day 06/21/2014 1245     07/06/2014 0500  piperacillin-tazobactam (ZOSYN) IVPB 3.375 g     3.375 g 100 mL/hr over 30 Minutes Intravenous  Once 06/18/2014 0452 06/30/2014 0721   06/26/2014 0500  vancomycin (VANCOCIN) IVPB 1000 mg/200 mL premix     1,000 mg 200 mL/hr over 60 Minutes Intravenous  Once 06/17/2014 0452 07/04/2014 0626     Assessment: 80yo M with dementia admitted from  [facility] with low O2 saturation, low grade fever, & leukocytosis.  CXR + PNA. Lactic acid level normal. Scr slightly elevated  above patient's baseline.   Vancomycin 10/30>> Zosyn 10/30>>  Goal of Therapy:  Vancomycin trough level 15-20 mcg/ml  Plan:  Zosyn 3.375gm IV Q8h to be infused over 4hrs Vancomycin 750mg  IV q12h Check Vancomycin trough at steady state Monitor renal function and cx data   Biagio Borg 06/16/2014,12:45 PM

## 2014-07-09 NOTE — Progress Notes (Signed)
Took Patient of BiPAP requesting water Pt was able to drink with out problems on 2lpm/[place]

## 2014-07-09 NOTE — Progress Notes (Signed)
UR chart review completed.  

## 2014-07-10 ENCOUNTER — Inpatient Hospital Stay (HOSPITAL_COMMUNITY): Payer: Medicare Other

## 2014-07-10 LAB — CBC
HCT: 33.6 % — ABNORMAL LOW (ref 39.0–52.0)
HEMOGLOBIN: 9.5 g/dL — AB (ref 13.0–17.0)
MCH: 27.7 pg (ref 26.0–34.0)
MCHC: 28.3 g/dL — ABNORMAL LOW (ref 30.0–36.0)
MCV: 98 fL (ref 78.0–100.0)
Platelets: 210 10*3/uL (ref 150–400)
RBC: 3.43 MIL/uL — ABNORMAL LOW (ref 4.22–5.81)
RDW: 16.7 % — ABNORMAL HIGH (ref 11.5–15.5)
WBC: 9.1 10*3/uL (ref 4.0–10.5)

## 2014-07-10 LAB — COMPREHENSIVE METABOLIC PANEL
ALBUMIN: 3.1 g/dL — AB (ref 3.5–5.2)
ALK PHOS: 62 U/L (ref 39–117)
ALT: 9 U/L (ref 0–53)
AST: 11 U/L (ref 0–37)
Anion gap: 8 (ref 5–15)
BILIRUBIN TOTAL: 0.4 mg/dL (ref 0.3–1.2)
BUN: 29 mg/dL — ABNORMAL HIGH (ref 6–23)
CHLORIDE: 94 meq/L — AB (ref 96–112)
CO2: 44 mEq/L (ref 19–32)
Calcium: 10.2 mg/dL (ref 8.4–10.5)
Creatinine, Ser: 1.3 mg/dL (ref 0.50–1.35)
GFR calc Af Amer: 58 mL/min — ABNORMAL LOW (ref >90)
GFR calc non Af Amer: 50 mL/min — ABNORMAL LOW (ref >90)
GLUCOSE: 104 mg/dL — AB (ref 70–99)
POTASSIUM: 4.6 meq/L (ref 3.7–5.3)
SODIUM: 146 meq/L (ref 137–147)
TOTAL PROTEIN: 7.3 g/dL (ref 6.0–8.3)

## 2014-07-10 LAB — PROTIME-INR
INR: 0.97 (ref 0.00–1.49)
Prothrombin Time: 13 seconds (ref 11.6–15.2)

## 2014-07-10 LAB — GLUCOSE, CAPILLARY
GLUCOSE-CAPILLARY: 102 mg/dL — AB (ref 70–99)
Glucose-Capillary: 100 mg/dL — ABNORMAL HIGH (ref 70–99)
Glucose-Capillary: 114 mg/dL — ABNORMAL HIGH (ref 70–99)

## 2014-07-10 MED ORDER — SENNOSIDES-DOCUSATE SODIUM 8.6-50 MG PO TABS: 1.0000 | ORAL_TABLET | Freq: Every evening | ORAL | Status: DC | PRN

## 2014-07-10 MED ORDER — CITALOPRAM HYDROBROMIDE 20 MG PO TABS
10.0000 mg | ORAL_TABLET | Freq: Every day | ORAL | Status: DC
  Filled 2014-07-10: qty 1

## 2014-07-10 MED ORDER — DIVALPROEX SODIUM 125 MG PO CPSP
500.0000 mg | ORAL_CAPSULE | Freq: Three times a day (TID) | ORAL | Status: DC
  Filled 2014-07-10 (×7): qty 4

## 2014-07-10 MED ORDER — CHLORHEXIDINE GLUCONATE 0.12 % MT SOLN: 15.0000 mL | Freq: Two times a day (BID) | OROMUCOSAL | Status: AC

## 2014-07-10 MED ORDER — PRAVASTATIN SODIUM 10 MG PO TABS: 20.0000 mg | ORAL_TABLET | Freq: Every day | ORAL | Status: DC

## 2014-07-10 MED ORDER — MORPHINE SULFATE (CONCENTRATE) 10 MG /0.5 ML PO SOLN
2.5000 mg | ORAL | Status: DC | PRN
  Administered 2014-07-10 – 2014-07-11 (×4): 2.6 mg via ORAL
  Filled 2014-07-10 (×4): qty 0.5

## 2014-07-10 MED ORDER — MORPHINE SULFATE (CONCENTRATE) 10 MG /0.5 ML PO SOLN: 2.5000 mg | ORAL | Status: AC | PRN

## 2014-07-10 MED ORDER — LORAZEPAM 0.5 MG PO TABS
1.0000 mg | ORAL_TABLET | Freq: Four times a day (QID) | ORAL | Status: DC | PRN
  Administered 2014-07-10: 1 mg via ORAL
  Filled 2014-07-10: qty 2

## 2014-07-10 MED ORDER — FERROUS SULFATE 325 (65 FE) MG PO TABS
325.0000 mg | ORAL_TABLET | Freq: Two times a day (BID) | ORAL | Status: DC
Start: 2014-07-10 — End: 2014-07-10
  Administered 2014-07-10: 325 mg via ORAL
  Filled 2014-07-10: qty 1

## 2014-07-10 MED ORDER — LEVETIRACETAM 250 MG PO TABS
250.0000 mg | ORAL_TABLET | Freq: Two times a day (BID) | ORAL | Status: DC
  Filled 2014-07-10: qty 1

## 2014-07-10 MED ORDER — LORAZEPAM 1 MG PO TABS: 1.0000 mg | ORAL_TABLET | Freq: Four times a day (QID) | ORAL | Status: AC | PRN

## 2014-07-10 MED ORDER — LORAZEPAM 2 MG/ML IJ SOLN
1.0000 mg | Freq: Four times a day (QID) | INTRAMUSCULAR | Status: DC | PRN
  Administered 2014-07-10: 1 mg via INTRAVENOUS
  Filled 2014-07-10: qty 1

## 2014-07-10 MED ORDER — IPRATROPIUM-ALBUTEROL 0.5-2.5 (3) MG/3ML IN SOLN: 3.0000 mL | RESPIRATORY_TRACT | Status: DC | PRN

## 2014-07-10 NOTE — Progress Notes (Signed)
[name] [name] ALP:[id number] DOB: December 25, 1930 DOA: 06/17/2014 PCP: [name], MD  Brief narrative: 80 y/o ?, recent admit 03/30/14, 3/53/29 Toxic metabolic encephalopathy 2/2 to Hypoxic resp failure [non-compliant with Bipap], DM ty II, Severe dementia, Dysphagia, h/o COPD, L eye blindness, episodic seizure, Pleural nodules, [facility]2014 a.m. with increasing shortness of breath/confusion. He is unable to give any meaningful history  Patient at baseline is able to discuss with family and is oriented. He has good faculties but is blind in left eye and is very hard of hearing and needs to hearing aids.  He usually is on oxygen and has been for a long time and is compliant with BiPAP at night. He started having labored breathing per report from family on 10/31. He told his daughter in the afternoon he could not breathe.  Initial ABG showed pH 7.14, PCO2 unreportable, PO2 300. Chest x-ray showed borderline cardiomegaly with increased central pulmonary vascular congestion and interstitial prominence suggesting edema, infection not excluded   Past medical history-As per Problem list Chart reviewed as below- Reviewed   Consultants:  none  Procedures:  CXr  Antibiotics:  Zosyn 10/31  Vancomycin 10/31   Subjective  Completely oriented.  Can tell me date, year and month Confuses Halloween with thanksgiving however. Thirsty and hungry   Objective    Interim History:   Telemetry: Sinus tach   Objective: Filed Vitals:   07/10/14 0545 07/10/14 0600 07/10/14 0654 07/10/14 0700  BP: 92/60 88/62  106/59  Pulse: 90 92  88  Temp:  99.4 F (37.4 C)  98.5 F (36.9 C)  TempSrc:  Oral  Oral  Resp: 16 16  17   Height:      Weight:      SpO2: 92% 89% 92% 100%    Intake/Output Summary (Last 24 hours) at 07/10/14 0821 Last data filed at 07/10/14 0600  Gross per 24 hour  Intake    490 ml  Output   1250 ml  Net   -760 ml    Exam:  General: Poor  visual acuity.  Very hard of hearing Cardiovascular: S1-S2 no murmur or gallop Respiratory: Clinically clear no added sound Abdomen: Soft nontender nondistended no rebound Skin no lower extremity edema or breakdown Neuro intact moving 4 limbs equally  Data Reviewed: Basic Metabolic Panel:  Recent Labs Lab 06/13/2014 0340 07/10/14 0512  NA 142 146  K 5.6* 4.6  CL 96 94*  CO2 38* 44*  GLUCOSE 156* 104*  BUN 24* 29*  CREATININE 1.13 1.30  CALCIUM 10.2 10.2   Liver Function Tests:  Recent Labs Lab 06/26/2014 0340 07/10/14 0512  AST 17 11  ALT 14 9  ALKPHOS 81 62  BILITOT 0.2* 0.4  PROT 8.1 7.3  ALBUMIN 3.3* 3.1*   No results found for this basename: LIPASE, AMYLASE,  in the last 168 hours No results found for this basename: AMMONIA,  in the last 168 hours CBC:  Recent Labs Lab 06/18/2014 0340 07/10/14 0512  WBC 15.0* 9.1  NEUTROABS 12.0*  --   HGB 10.8* 9.5*  HCT 38.6* 33.6*  MCV 98.7 98.0  PLT 219 210   Cardiac Enzymes:  Recent Labs Lab 06/20/2014 0340  TROPONINI <0.30   BNP: No components found with this basename: POCBNP,  CBG:  Recent Labs Lab 06/28/2014 1659 06/29/2014 2139 07/10/14 0015 07/10/14 0401 07/10/14 0801  GLUCAP 93 100* 100* 102* 114*    Recent Results (from the past 240 hour(s))  CULTURE, BLOOD (ROUTINE X 2)     Status: None   Collection Time    07/07/2014  5:10 AM      Result Value Ref Range Status   Specimen Description BLOOD RIGHT ANTECUBITAL   Final   Special Requests     Final   Value: BOTTLES DRAWN AEROBIC AND ANAEROBIC AEB 6CC ANA 5CC   Culture NO GROWTH <24 HRS   Final   Report Status PENDING   Incomplete  CULTURE, BLOOD (ROUTINE X 2)     Status: None   Collection Time    06/14/2014  5:10 AM      Result Value Ref Range Status   Specimen Description BLOOD RIGHT HAND   Final   Special Requests BOTTLES DRAWN AEROBIC AND ANAEROBIC 4CC EACH   Final   Culture NO GROWTH <24 HRS   Final   Report Status PENDING   Incomplete  MRSA  PCR SCREENING     Status: None   Collection Time    06/26/2014  6:30 AM      Result Value Ref Range Status   MRSA by PCR NEGATIVE  NEGATIVE Final   Comment:            The GeneXpert MRSA Assay (FDA     approved for NASAL specimens     only), is one component of a     comprehensive MRSA colonization     surveillance program. It is not     intended to diagnose MRSA     infection nor to guide or     monitor treatment for     MRSA infections.     Studies:              All Imaging reviewed and is as per above notation   Scheduled Meds: . antiseptic oral rinse  7 mL Mouth Rinse q12n4p  . chlorhexidine  15 mL Mouth Rinse BID  . dorzolamide-timolol  1 drop Right Eye BID  . heparin  5,000 Units Subcutaneous 3 times per day  . insulin aspart  0-9 Units Subcutaneous TID WC  . insulin glargine  5 Units Subcutaneous QHS  . ipratropium-albuterol  3 mL Nebulization Q4H  . piperacillin-tazobactam (ZOSYN)  IV  3.375 g Intravenous 3 times per day  . sodium chloride  3 mL Intravenous Q12H  . vancomycin  750 mg Intravenous Q12H   Continuous Infusions:    Assessment/Plan: 1. Acute hypercapnic respiratory failure-multifactorial. significantly improved removed. Does not need BiPAP currently. Obtain echocardiogram non-emergently 2. Acute decompensated new onset undifferentiated heart failure-Lasix 40 mg IV twice a day has been changed to 40 mg daily I will discuss with family need for echocardiogram. 3. Aspiration pneumonia with chronic Dysphagia 1-likely component to his respiratory failure. Obtain repeat chest x-ray. Continue vancomycin and Zosyn for 24 more hours and then transition to oral aspiration coverage. 4. Diabetes mellitus type 2-transition to Lantus 5 mg daily to every 4 hourly coverage. Diabetic diet dysphagia type I. Currently his metformin. He is 5. History COPD-continue duo nebs every 4 hourly. Add dulera to meds. No current wheezing 6. Dementia-I'm not sure if he has this diagnosis.  He is pretty alert and oriented. We will hold off on his Aricept 10 mg for now. He has Celexa 10 mg daily which can be restarted but I would hold off on his 7. Hypotension-likely secondary to volume depletion from Lasix. Monitor. 8. Normocytic anemia-unclear etiology, continue iron 7 9. ? Seizure disorder-restart Keppra 250 twice a  day   Code Status: DO NOT RESUSCITATE Family Communication: Will discuss with family Disposition Plan: Keep on stepdown but can transfer to telemetry later today   Verneita Griffes, MD  [facility] Pager [phone] 07/10/2014, 8:21 AM    LOS: 1 day

## 2014-07-10 NOTE — Progress Notes (Signed)
Placed patient on 100% NRB per Dr. Verlon Au.  Family has decided to make him comfort care.  Will continue to monitor.

## 2014-07-10 NOTE — Progress Notes (Signed)
CRITICAL VALUE ALERT  Critical value received:  CO2 - 44  Date of notification:  07/10/14   Time of notification:  0659  Critical value read back: yes  Nurse who received alert:  Candiss Norse RN   MD notified (1st page):  R. Shanon Brow   Time of first page:  [phone]  MD notified (2nd page):  Time of second page:  Responding MD:  Raye Sorrow  Time MD responded:  [phone]

## 2014-07-11 DIAGNOSIS — J9602 Acute respiratory failure with hypercapnia: Secondary | ICD-10-CM

## 2014-07-11 MED ORDER — SCOPOLAMINE 1 MG/3DAYS TD PT72
MEDICATED_PATCH | TRANSDERMAL | Status: AC
  Filled 2014-07-11: qty 1

## 2014-07-11 MED ORDER — MORPHINE SULFATE (CONCENTRATE) 10 MG/0.5ML PO SOLN
2.6000 mg | ORAL | Status: DC | PRN
  Administered 2014-07-11 – 2014-07-12 (×5): 2.6 mg via ORAL
  Filled 2014-07-11 (×5): qty 0.5

## 2014-07-11 MED ORDER — SCOPOLAMINE 1 MG/3DAYS TD PT72
1.0000 | MEDICATED_PATCH | TRANSDERMAL | Status: DC
  Administered 2014-07-11: 1.5 mg via TRANSDERMAL
  Filled 2014-07-11 (×2): qty 1

## 2014-07-11 NOTE — Progress Notes (Signed)
Nutrition Brief Note  Chart reviewed. Pt now transitioning to comfort care.  No further nutrition interventions warranted at this time.  Please re-consult as needed.   Lynn Deoni Cosey MS,RD,CSG,LDN Office: #[phone] Pager: #[phone]    

## 2014-07-11 NOTE — Care Management Note (Addendum)
    Page 1 of 1   07/29/14     10:37:03 AM CARE MANAGEMENT NOTE Jul 29, 2014  Patient:  [name] [name], [name] [name]   Account Number:  [id number]  Date Initiated:  06/27/2014  Documentation initiated by:  Theophilus Kinds  Subjective/Objective Assessment:   Pt admitted from [facility] with pneumonia and respiratory failure. Pt will return to facility at discharge.     Action/Plan:   CSW is aware and will arrange discharge to facility when medically stable.   Anticipated DC Date:  07/16/2014   Anticipated DC Plan:  SKILLED NURSING FACILITY  In-house referral  Clinical Social Worker      DC Planning Services  CM consult      Choice offered to / List presented to:             Status of service:  Completed, signed off Medicare Important Message given?   (If response is "NO", the following Medicare IM given date fields will be blank) Date Medicare IM given:   Medicare IM given by:   Date Additional Medicare IM given:   Additional Medicare IM given by:    Discharge Disposition:  EXPIRED  Per UR Regulation:    If discussed at Long Length of Stay Meetings, dates discussed:    Comments:  07/29/14 [place], RN BSN CM Pt expired.  06/18/2014 [place], RN BSN CM

## 2014-07-11 NOTE — Progress Notes (Signed)
[name] [name] WCB:[id number] DOB: 02-25-1931 DOA: 07/08/2014 PCP: [name], MD  Brief narrative: 80 y/o ?, recent admit 03/30/14, 02/23/06 Toxic metabolic encephalopathy 2/2 to Hypoxic resp failure [non-compliant with Bipap], DM ty II, Severe dementia, Dysphagia, h/o COPD, L eye blindness, episodic seizure, Pleural nodules, [facility]2014 a.m. with increasing shortness of breath/confusion. He is unable to give any meaningful history  Patient at baseline is able to discuss with family and is oriented. He has good faculties but is blind in left eye and is very hard of hearing and needs to hearing aids.  He usually is on oxygen and has been for a long time and is compliant with BiPAP at night. He started having labored breathing per report from family on 10/31. He told his daughter in the afternoon he could not breathe.  Initial ABG showed pH 7.14, PCO2 unreportable, PO2 300. Chest x-ray showed borderline cardiomegaly with increased central pulmonary vascular congestion and interstitial prominence suggesting edema, infection not excluded   Past medical history-As per Problem list Chart reviewed as below- Reviewed   Consultants:  none  Procedures:  CXr  Antibiotics:  Zosyn 10/31->11/1  Vancomycin 10/31-11/1   Subjective   More alert and responsive initially yesterday however after eating significant change patient for short of breath and diaphoretic and dyspneic and chest heaviness and felt like he was being smothered When he was alert and oriented and we discussed his case in detail at the bedside with him as well as his granddaughters Abigail Butts and Dodge in present   Objective    Interim History:   Telemetry: Sinus tach   Objective: Filed Vitals:   07/11/14 0500 07/11/14 0729 07/11/14 0852 07/11/14 1150  BP:      Pulse:  92    Temp:   97.4 F (36.3 C) 95 F (35 C)  TempSrc:   Axillary Axillary  Resp:  31    Height:      Weight: 85.5  kg (188 lb 7.9 oz)     SpO2:  91%      Intake/Output Summary (Last 24 hours) at 07/11/14 1407 Last data filed at 07/11/14 0853  Gross per 24 hour  Intake    410 ml  Output   2350 ml  Net  -1940 ml    Exam:  General: Poor visual acuity.  Very hard of hearing Cardiovascular: S1-S2 no murmur or gallop Respiratory: Clinically clear no added sound Abdomen: Soft nontender nondistended no rebound Skin no lower extremity edema or breakdown Neuro intact moving 4 limbs equally  Data Reviewed: Basic Metabolic Panel:  Recent Labs Lab 06/14/2014 0340 07/10/14 0512  NA 142 146  K 5.6* 4.6  CL 96 94*  CO2 38* 44*  GLUCOSE 156* 104*  BUN 24* 29*  CREATININE 1.13 1.30  CALCIUM 10.2 10.2   Liver Function Tests:  Recent Labs Lab 06/29/2014 0340 07/10/14 0512  AST 17 11  ALT 14 9  ALKPHOS 81 62  BILITOT 0.2* 0.4  PROT 8.1 7.3  ALBUMIN 3.3* 3.1*   No results for input(s): LIPASE, AMYLASE in the last 168 hours. No results for input(s): AMMONIA in the last 168 hours. CBC:  Recent Labs Lab 07/06/2014 0340 07/10/14 0512  WBC 15.0* 9.1  NEUTROABS 12.0*  --   HGB 10.8* 9.5*  HCT 38.6* 33.6*  MCV 98.7 98.0  PLT 219 210   Cardiac Enzymes:  Recent Labs Lab 06/27/2014 0340  TROPONINI <0.30   BNP: Invalid input(s):  POCBNP CBG:  Recent Labs Lab 07/04/2014 1659 06/20/2014 2139 07/10/14 0015 07/10/14 0401 07/10/14 0801  GLUCAP 93 100* 100* 102* 114*    Recent Results (from the past 240 hour(s))  Blood culture (routine x 2)     Status: None (Preliminary result)   Collection Time: 06/25/2014  5:10 AM  Result Value Ref Range Status   Specimen Description BLOOD RIGHT ANTECUBITAL  Final   Special Requests   Final    BOTTLES DRAWN AEROBIC AND ANAEROBIC AEB 6CC ANA 5CC   Culture NO GROWTH <24 HRS  Final   Report Status PENDING  Incomplete  Blood culture (routine x 2)     Status: None (Preliminary result)   Collection Time: 07/10/2014  5:10 AM  Result Value Ref Range Status    Specimen Description BLOOD RIGHT HAND  Final   Special Requests BOTTLES DRAWN AEROBIC AND ANAEROBIC 4CC EACH  Final   Culture NO GROWTH <24 HRS  Final   Report Status PENDING  Incomplete  MRSA PCR Screening     Status: None   Collection Time: 06/11/2014  6:30 AM  Result Value Ref Range Status   MRSA by PCR NEGATIVE NEGATIVE Final    Comment:        The GeneXpert MRSA Assay (FDA approved for NASAL specimens only), is one component of a comprehensive MRSA colonization surveillance program. It is not intended to diagnose MRSA infection nor to guide or monitor treatment for MRSA infections.     Studies:              All Imaging reviewed and is as per above notation   Scheduled Meds: . antiseptic oral rinse  7 mL Mouth Rinse q12n4p  . chlorhexidine  15 mL Mouth Rinse BID  . dorzolamide-timolol  1 drop Right Eye BID  . scopolamine  1 patch Transdermal Q72H  . sodium chloride  3 mL Intravenous Q12H   Continuous Infusions:    Assessment/Plan: 1. Acute hypercapnic respiratory failure-multifactorial. significantly worsened agfter diet  2. Acute decompensated new onset undifferentiated heart failure-Lasix 40 mg IV twice a day has been changed to 40 mg daily I will discuss with family need for echocardiogram. 3. Aspiration pneumonia with chronic Dysphagia 1-likely component to his respiratory failure. Obtain repeat chest x-ray. Continue vancomycin and Zosyn for 24 more hours and then transition to oral aspiration coverage. 4. Diabetes mellitus type 2 5. History COPD 6. Dementia 7. Hypotension 8. Normocytic anemia 9. ? Seizure disorder  Patient has no meaningful interaction currently and is on full comfort measures inclusive of Roxanol and sublingual Ativan. He is on oxygen for comfort and Foley has been placed for comfort. I expect he will have a hospital death. If in the event that he continues to maintain stability over the next 12 hours, we could discharge him to the nursing facility  and hospice management per  Long discussion with family today who seemed to understand what is going on.  Verneita Griffes, MD  [facility] Pager [phone] 07/11/2014, 2:07 PM    LOS: 2 days

## 2014-07-11 DEATH — deceased

## 2014-07-12 LAB — GLUCOSE, CAPILLARY
Glucose-Capillary: 95 mg/dL (ref 70–99)
Glucose-Capillary: 147 mg/dL — ABNORMAL HIGH (ref 70–99)
Glucose-Capillary: 186 mg/dL — ABNORMAL HIGH (ref 70–99)

## 2014-07-14 LAB — CULTURE, BLOOD (ROUTINE X 2)
CULTURE: NO GROWTH
Culture: NO GROWTH

## 2014-08-10 NOTE — Discharge Summary (Addendum)
Death Summary  [name] [name] TOI:[id number] DOB: December 06, 1930 DOA: 07-10-14  PCP: [name], MD PCP/Office notified: yes  Admit date: 07-10-2014 Date of Death: 13-Jul-2014  Final Diagnoses:  Principal Problem:   Acute respiratory failure with hypercapnia Active Problems:   Presenile dementia with delusional features   GERD   Multiple lung nodules   Bradycardia   CO2 narcosis   Encephalopathy, metabolic   Anemia of chronic disease   Diabetes mellitus type 2, controlled   Sepsis   Hospital Course:  80 y/o ? with multiple co- morbidities,  as well asrecent admissions 03/30/14, 8/33/82 Toxic metabolic encephalopathy 2/2 to Hypoxic resp failure [non-compliant with Bipap], DM ty II, Severe dementia, Dysphagia, h/o COPD, L eye blindness, episodic seizure, Pleural nodules, [facility] a.m. with increasing shortness of breath/confusion. He was obtunded on admission.  Patient at baselineis oriented. He has good faculties but needs hearing aids.   He started having labored breathing per report from family on 10/31. He told his daughter in the afternoon he could not breathe.   Initial ABG showed pH 7.14, PCO2 unreportable, PO2 300.  Chest x-ray showed borderline cardiomegaly with increased central pulmonary vascular congestion and interstitial prominence suggesting edema, infection not excluded  He was initially admitted, placed on broad spectrum Abx and kept on continuous Bi-pap.  He made a good initial recovery within 24 hours and was much more alert and requesting to eat.  Unfortunately after eating, he developed CP and SOB again and had to ultimately be placed back on a NRb  famiy was updated at every stage of his car eand expressed their wish for him to have comfort care, as they realized his multiple illnesses and conditions were not curable     Time: 20  Signed:  Nita Sells  [facility] 07/13/14, 7:19 AM

## 2014-08-10 NOTE — Progress Notes (Signed)
Patient's family was kept informed of his status throughout his comfort care process this evening.  Patient started to have decreased heart rate 60's which dropped from the 80's and increased respiratory rates in the 40's.  Family members in the room contacted the others.  The patient passed away at 440 and was verified by A. Lanny Hurst RN.  E-link, Bed Control and House supervisor was informed immediately.

## 2014-08-10 DEATH — deceased

## 2014-10-10 NOTE — Telephone Encounter (Signed)
by

## 2015-10-11 IMAGING — CR DG CHEST 1V PORT
1 series · 1 of 1 positions shown · non-contrast
Comparison: 02/26/2014

CLINICAL DATA: Respiratory distress and COPD.

EXAM:
PORTABLE CHEST - 1 VIEW

[portable]
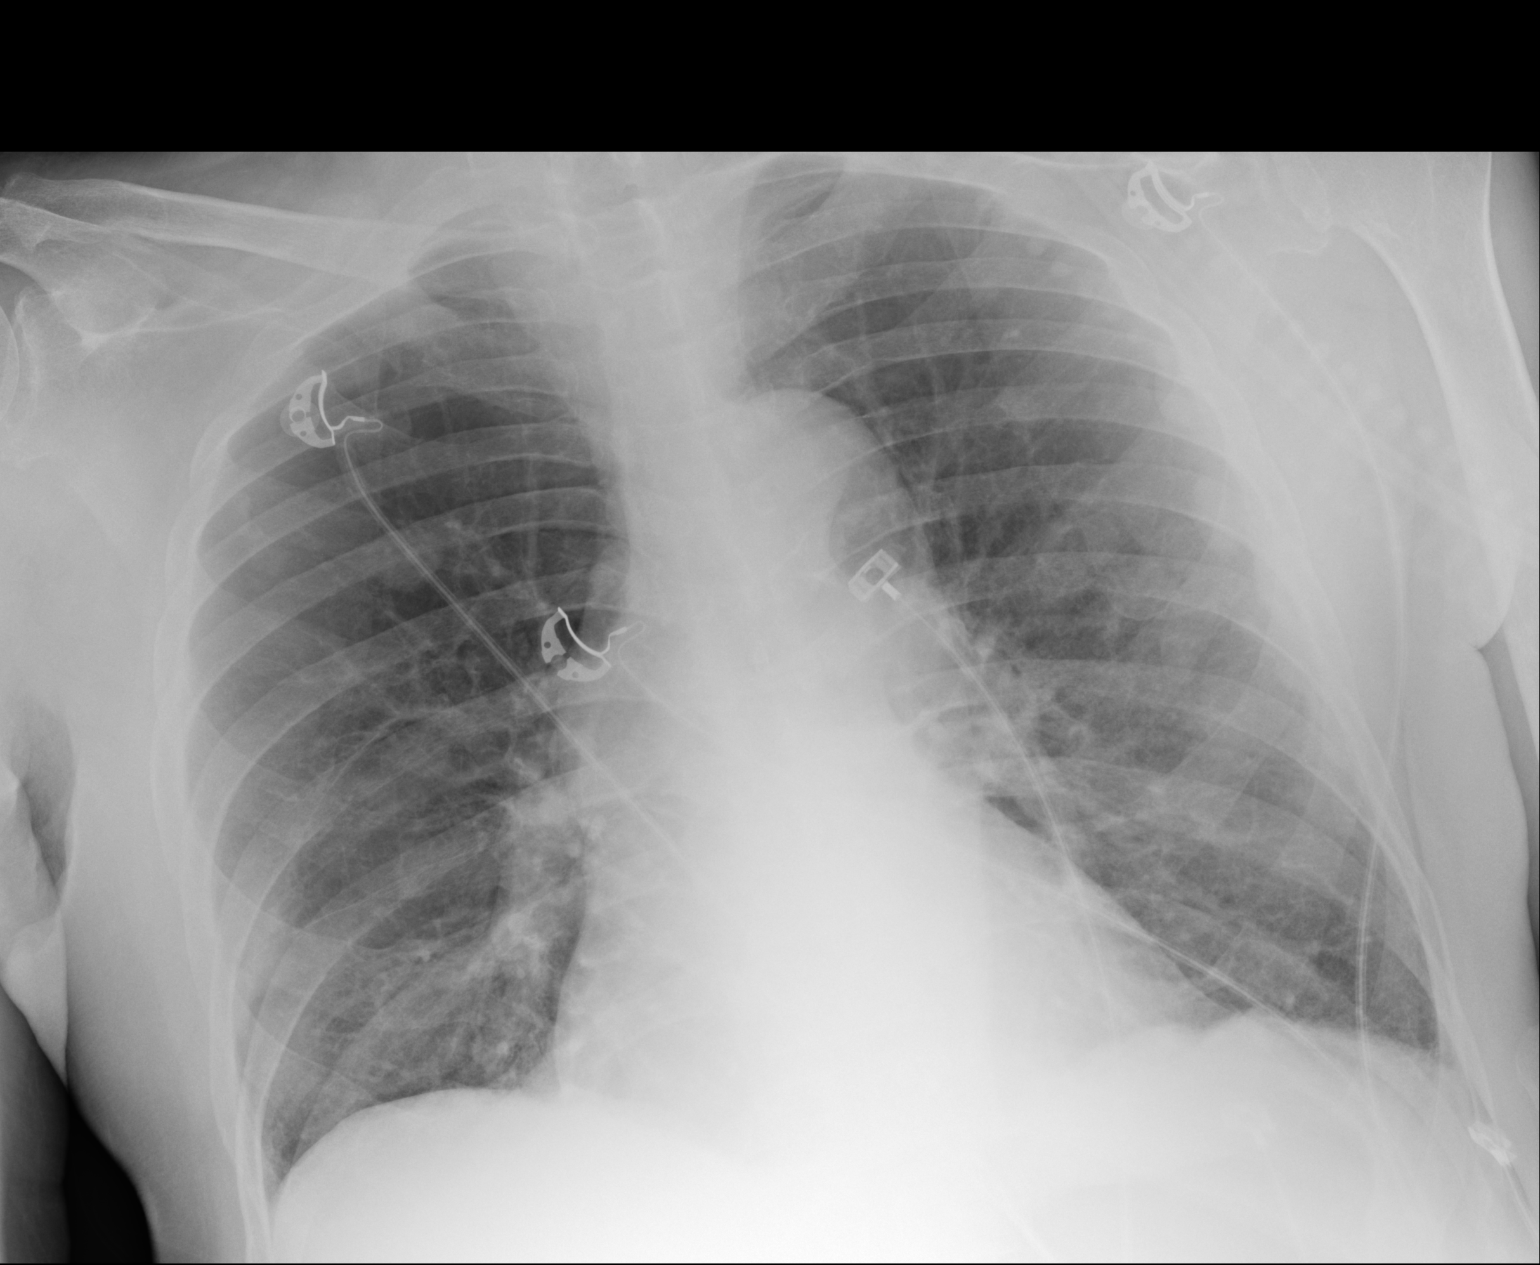

[1 of 1 positions shown; findings below may reference images not displayed]

FINDINGS: Stable underlying severe emphysematous lung disease. Mild bibasilar
scarring/ atelectasis present. There is no evidence of pulmonary
edema, consolidation, pneumothorax, nodule or pleural fluid. The
heart size is normal. The aorta shows stable tortuosity.
IMPRESSION: Stable COPD.  Mild bibasilar scarring/ atelectasis.

## 2015-12-04 IMAGING — CR DG CHEST 2V
2 series · 2 of 2 positions shown · non-contrast
Comparison: 05/05/2014

CLINICAL DATA: Cough.

EXAM:
CHEST  2 VIEW

[view not recorded (1 of 2)]
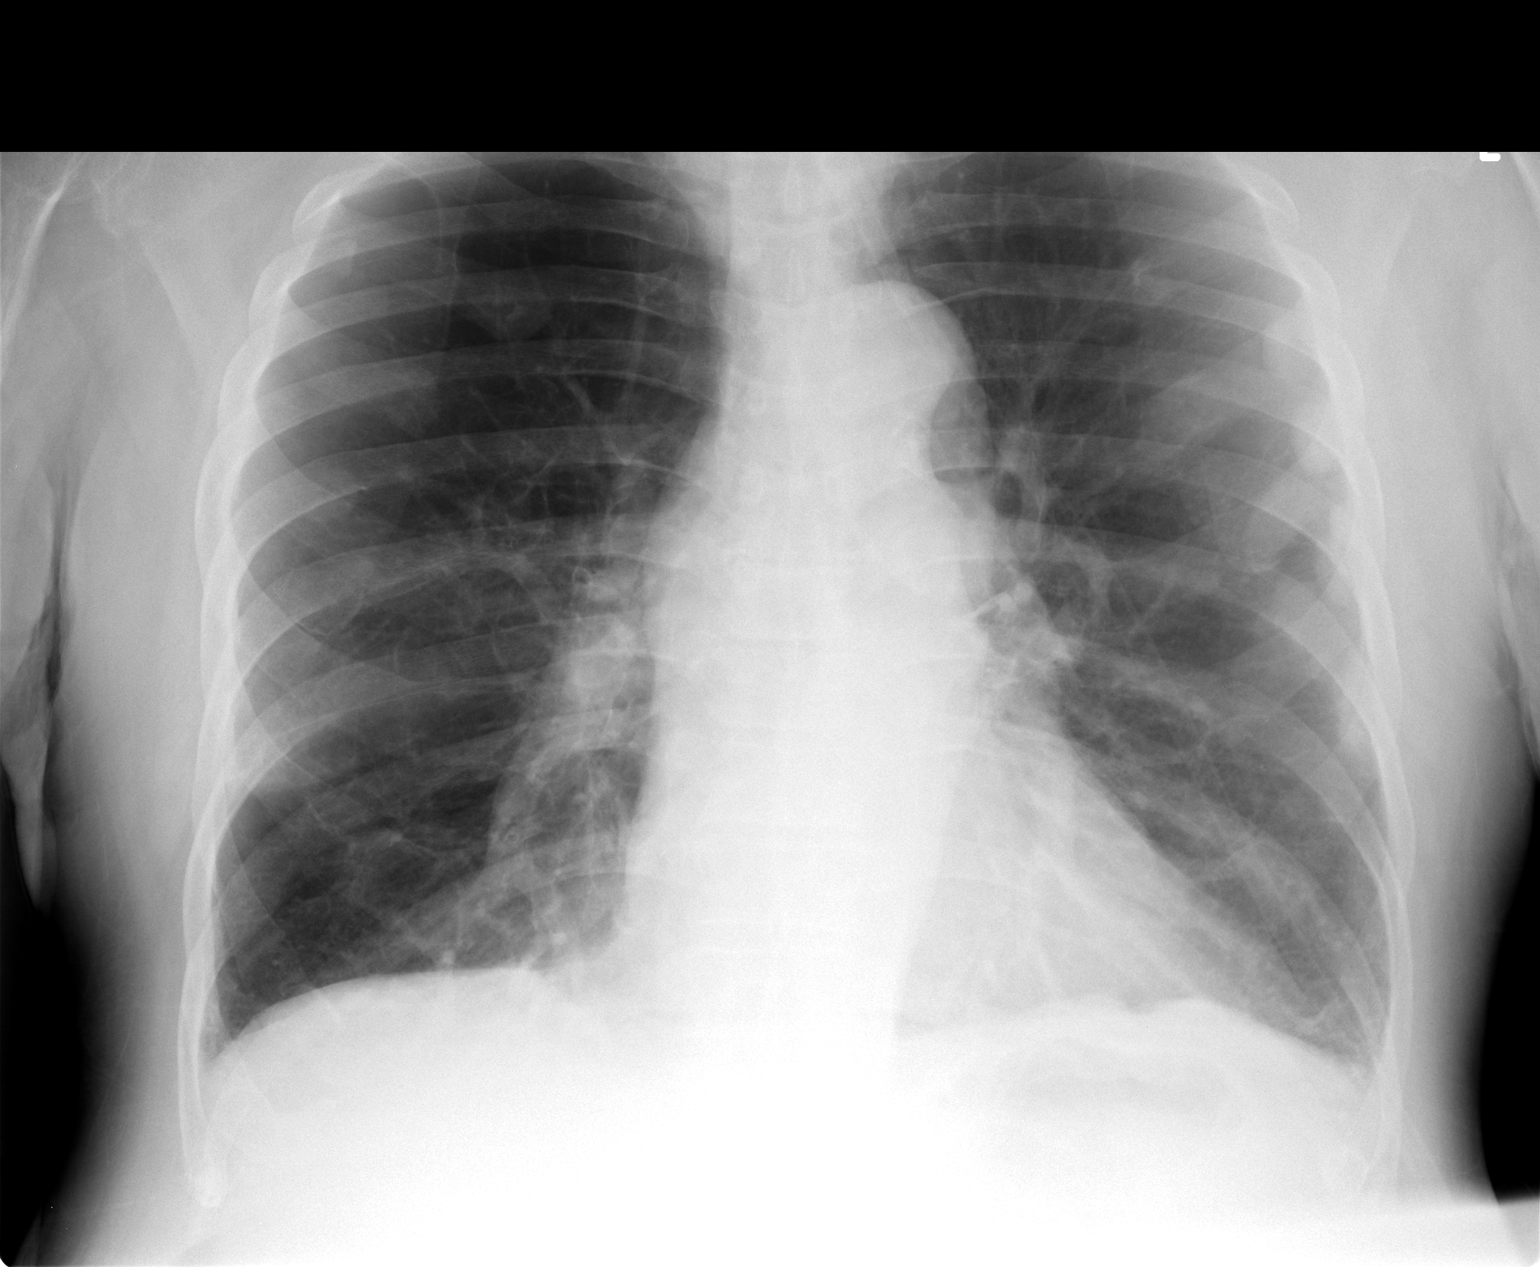

[view not recorded (2 of 2)]
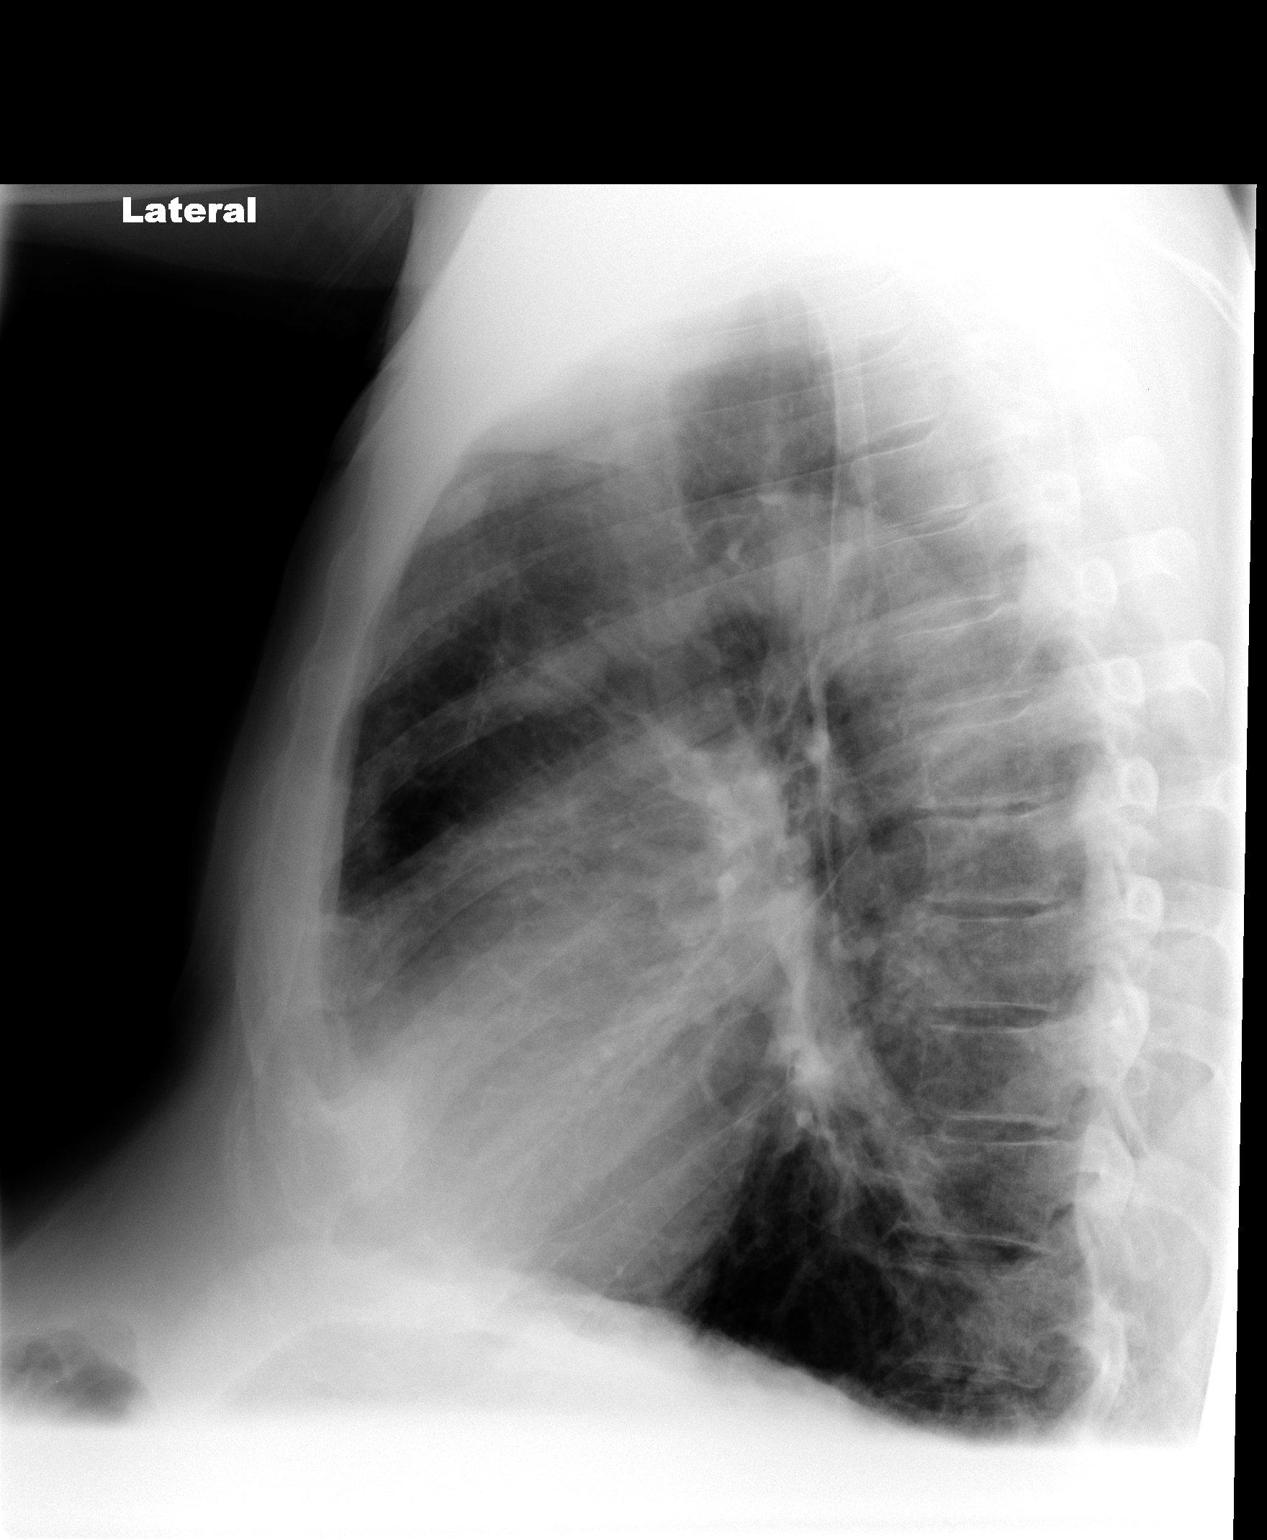

[2 of 2 positions shown; findings below may reference images not displayed]

FINDINGS: Changes of COPD are again demonstrated. No evidence of acute
infiltrate or pleural effusion. Heart size is stable. Bilateral
pleural based nodular densities are again seen and without
significant change.
IMPRESSION: No acute findings.

Stable COPD and bilateral pleural plaque.
